# Patient Record
Sex: Female | Born: 1935 | Race: White | Hispanic: No | Smoking: Never smoker
Health system: Southern US, Community
[De-identification: ages and names within clinical notes are randomized; demographics above are authoritative.]

## PROBLEM LIST (undated history)

## (undated) DIAGNOSIS — C9002 Multiple myeloma in relapse: Secondary | ICD-10-CM

## (undated) DIAGNOSIS — I82409 Acute embolism and thrombosis of unspecified deep veins of unspecified lower extremity: Secondary | ICD-10-CM

## (undated) DIAGNOSIS — M879 Osteonecrosis, unspecified: Secondary | ICD-10-CM

## (undated) DIAGNOSIS — I4891 Unspecified atrial fibrillation: Secondary | ICD-10-CM

## (undated) DIAGNOSIS — I2510 Atherosclerotic heart disease of native coronary artery without angina pectoris: Secondary | ICD-10-CM

## (undated) DIAGNOSIS — F419 Anxiety disorder, unspecified: Secondary | ICD-10-CM

## (undated) DIAGNOSIS — Z9289 Personal history of other medical treatment: Secondary | ICD-10-CM

## (undated) DIAGNOSIS — E785 Hyperlipidemia, unspecified: Secondary | ICD-10-CM

## (undated) DIAGNOSIS — M8708 Idiopathic aseptic necrosis of bone, other site: Secondary | ICD-10-CM

## (undated) DIAGNOSIS — M1990 Unspecified osteoarthritis, unspecified site: Secondary | ICD-10-CM

## (undated) DIAGNOSIS — I2699 Other pulmonary embolism without acute cor pulmonale: Secondary | ICD-10-CM

## (undated) DIAGNOSIS — C9000 Multiple myeloma not having achieved remission: Secondary | ICD-10-CM

## (undated) DIAGNOSIS — I10 Essential (primary) hypertension: Secondary | ICD-10-CM

## (undated) HISTORY — DX: Osteonecrosis, unspecified: M87.9

## (undated) HISTORY — DX: Unspecified atrial fibrillation: I48.91

## (undated) HISTORY — DX: Anxiety disorder, unspecified: F41.9

## (undated) HISTORY — DX: Multiple myeloma in relapse: C90.02

## (undated) HISTORY — DX: Idiopathic aseptic necrosis of bone, other site: M87.08

## (undated) HISTORY — DX: Hyperlipidemia, unspecified: E78.5

## (undated) HISTORY — DX: Essential (primary) hypertension: I10

## (undated) HISTORY — DX: Atherosclerotic heart disease of native coronary artery without angina pectoris: I25.10

## (undated) HISTORY — DX: Unspecified osteoarthritis, unspecified site: M19.90

## (undated) HISTORY — DX: Personal history of other medical treatment: Z92.89

## (undated) HISTORY — PX: APPENDECTOMY: SHX54

## (undated) HISTORY — PX: ABDOMINAL HYSTERECTOMY: SHX81

## (undated) HISTORY — PX: PARTIAL COLECTOMY: SHX5273

## (undated) HISTORY — PX: TOTAL KNEE REVISION: SHX996

## (undated) HISTORY — DX: Multiple myeloma not having achieved remission: C90.00

---

## 2004-08-30 ENCOUNTER — Ambulatory Visit: Payer: Self-pay | Admitting: Internal Medicine

## 2004-09-07 ENCOUNTER — Ambulatory Visit: Payer: Self-pay | Admitting: Internal Medicine

## 2004-09-28 ENCOUNTER — Ambulatory Visit: Payer: Self-pay | Admitting: Internal Medicine

## 2004-10-04 ENCOUNTER — Ambulatory Visit: Payer: Self-pay | Admitting: Internal Medicine

## 2004-10-29 ENCOUNTER — Ambulatory Visit: Payer: Self-pay | Admitting: Surgery

## 2005-09-11 ENCOUNTER — Ambulatory Visit: Payer: Self-pay | Admitting: Ophthalmology

## 2005-09-17 ENCOUNTER — Ambulatory Visit: Payer: Self-pay | Admitting: Ophthalmology

## 2005-09-28 ENCOUNTER — Emergency Department: Payer: Self-pay | Admitting: Emergency Medicine

## 2005-10-29 ENCOUNTER — Emergency Department: Payer: Self-pay | Admitting: Emergency Medicine

## 2006-02-14 ENCOUNTER — Ambulatory Visit: Payer: Self-pay | Admitting: Family Medicine

## 2006-05-20 ENCOUNTER — Ambulatory Visit: Payer: Self-pay | Admitting: Internal Medicine

## 2006-05-20 ENCOUNTER — Ambulatory Visit: Payer: Self-pay | Admitting: General Practice

## 2006-05-27 ENCOUNTER — Ambulatory Visit: Payer: Self-pay | Admitting: Internal Medicine

## 2006-09-03 ENCOUNTER — Ambulatory Visit: Payer: Self-pay | Admitting: General Practice

## 2006-09-03 ENCOUNTER — Other Ambulatory Visit: Payer: Self-pay

## 2006-09-15 ENCOUNTER — Ambulatory Visit: Payer: Self-pay | Admitting: General Practice

## 2006-12-30 ENCOUNTER — Ambulatory Visit: Payer: Self-pay | Admitting: Ophthalmology

## 2007-03-05 ENCOUNTER — Ambulatory Visit: Payer: Self-pay | Admitting: General Practice

## 2007-03-05 ENCOUNTER — Other Ambulatory Visit: Payer: Self-pay

## 2007-03-18 ENCOUNTER — Inpatient Hospital Stay: Payer: Self-pay | Admitting: General Practice

## 2007-05-15 ENCOUNTER — Ambulatory Visit: Payer: Self-pay | Admitting: Physician Assistant

## 2007-05-18 ENCOUNTER — Ambulatory Visit: Payer: Self-pay | Admitting: General Practice

## 2007-06-02 ENCOUNTER — Ambulatory Visit: Payer: Self-pay | Admitting: Family Medicine

## 2007-06-15 ENCOUNTER — Ambulatory Visit: Payer: Self-pay | Admitting: Gastroenterology

## 2007-06-17 ENCOUNTER — Ambulatory Visit: Payer: Self-pay | Admitting: Anesthesiology

## 2007-07-16 ENCOUNTER — Ambulatory Visit: Payer: Self-pay | Admitting: Family Medicine

## 2007-07-17 ENCOUNTER — Ambulatory Visit: Payer: Self-pay | Admitting: Anesthesiology

## 2007-08-21 ENCOUNTER — Ambulatory Visit: Payer: Self-pay | Admitting: Anesthesiology

## 2007-09-16 ENCOUNTER — Ambulatory Visit: Payer: Self-pay | Admitting: Anesthesiology

## 2007-10-29 ENCOUNTER — Ambulatory Visit: Payer: Self-pay | Admitting: Anesthesiology

## 2007-11-11 ENCOUNTER — Ambulatory Visit: Payer: Self-pay | Admitting: Internal Medicine

## 2007-11-11 ENCOUNTER — Ambulatory Visit: Payer: Self-pay | Admitting: General Practice

## 2007-11-11 ENCOUNTER — Other Ambulatory Visit: Payer: Self-pay

## 2007-11-16 ENCOUNTER — Ambulatory Visit: Payer: Self-pay | Admitting: General Practice

## 2007-12-21 ENCOUNTER — Ambulatory Visit: Payer: Self-pay | Admitting: Family Medicine

## 2008-10-25 ENCOUNTER — Ambulatory Visit: Payer: Self-pay | Admitting: Internal Medicine

## 2009-11-08 ENCOUNTER — Ambulatory Visit: Payer: Self-pay | Admitting: Internal Medicine

## 2009-11-15 ENCOUNTER — Ambulatory Visit: Payer: Self-pay | Admitting: Pain Medicine

## 2009-11-20 ENCOUNTER — Ambulatory Visit: Payer: Self-pay | Admitting: Pain Medicine

## 2009-11-23 ENCOUNTER — Ambulatory Visit: Payer: Self-pay | Admitting: Rheumatology

## 2009-11-24 ENCOUNTER — Ambulatory Visit: Payer: Self-pay | Admitting: Internal Medicine

## 2009-12-05 ENCOUNTER — Ambulatory Visit: Payer: Self-pay | Admitting: Internal Medicine

## 2009-12-08 ENCOUNTER — Ambulatory Visit: Payer: Self-pay | Admitting: Internal Medicine

## 2009-12-26 ENCOUNTER — Ambulatory Visit: Payer: Self-pay | Admitting: Vascular Surgery

## 2010-01-08 ENCOUNTER — Ambulatory Visit: Payer: Self-pay | Admitting: Internal Medicine

## 2010-02-08 ENCOUNTER — Ambulatory Visit: Payer: Self-pay | Admitting: Internal Medicine

## 2010-03-10 ENCOUNTER — Ambulatory Visit: Payer: Self-pay | Admitting: Internal Medicine

## 2010-04-10 ENCOUNTER — Ambulatory Visit: Payer: Self-pay | Admitting: Internal Medicine

## 2010-05-10 ENCOUNTER — Ambulatory Visit: Payer: Self-pay | Admitting: Internal Medicine

## 2010-06-10 ENCOUNTER — Ambulatory Visit: Payer: Self-pay | Admitting: Internal Medicine

## 2010-07-11 ENCOUNTER — Ambulatory Visit: Payer: Self-pay | Admitting: Internal Medicine

## 2010-08-09 ENCOUNTER — Ambulatory Visit: Payer: Self-pay | Admitting: Internal Medicine

## 2010-09-09 ENCOUNTER — Ambulatory Visit: Payer: Self-pay | Admitting: Internal Medicine

## 2010-10-09 ENCOUNTER — Ambulatory Visit: Payer: Self-pay | Admitting: Internal Medicine

## 2010-11-09 ENCOUNTER — Ambulatory Visit: Payer: Self-pay | Admitting: Internal Medicine

## 2010-12-09 ENCOUNTER — Ambulatory Visit: Payer: Self-pay | Admitting: Internal Medicine

## 2010-12-26 ENCOUNTER — Ambulatory Visit: Payer: Self-pay | Admitting: Internal Medicine

## 2011-01-09 ENCOUNTER — Ambulatory Visit: Payer: Self-pay | Admitting: Internal Medicine

## 2011-02-09 ENCOUNTER — Ambulatory Visit: Payer: Self-pay | Admitting: Internal Medicine

## 2011-02-27 ENCOUNTER — Ambulatory Visit: Payer: Self-pay | Admitting: Pain Medicine

## 2011-02-28 ENCOUNTER — Ambulatory Visit: Payer: Self-pay | Admitting: Pain Medicine

## 2011-03-06 ENCOUNTER — Ambulatory Visit: Payer: Self-pay | Admitting: Internal Medicine

## 2011-03-11 ENCOUNTER — Ambulatory Visit: Payer: Self-pay | Admitting: Internal Medicine

## 2011-03-25 ENCOUNTER — Ambulatory Visit: Payer: Self-pay | Admitting: Pain Medicine

## 2011-03-28 ENCOUNTER — Ambulatory Visit: Payer: Self-pay | Admitting: Pain Medicine

## 2011-04-03 ENCOUNTER — Ambulatory Visit: Payer: Self-pay | Admitting: Internal Medicine

## 2011-04-11 ENCOUNTER — Ambulatory Visit: Payer: Self-pay | Admitting: Internal Medicine

## 2011-04-15 ENCOUNTER — Ambulatory Visit: Payer: Self-pay | Admitting: Pain Medicine

## 2011-04-18 ENCOUNTER — Ambulatory Visit: Payer: Self-pay | Admitting: Pain Medicine

## 2011-05-01 ENCOUNTER — Ambulatory Visit: Payer: Self-pay | Admitting: Internal Medicine

## 2011-05-06 ENCOUNTER — Ambulatory Visit: Payer: Self-pay | Admitting: Pain Medicine

## 2011-05-11 ENCOUNTER — Ambulatory Visit: Payer: Self-pay | Admitting: Oncology

## 2011-05-11 ENCOUNTER — Ambulatory Visit: Payer: Self-pay | Admitting: Internal Medicine

## 2011-05-20 ENCOUNTER — Ambulatory Visit: Payer: Self-pay | Admitting: Pain Medicine

## 2011-06-19 ENCOUNTER — Ambulatory Visit: Payer: Self-pay | Admitting: Internal Medicine

## 2011-06-19 DIAGNOSIS — I4891 Unspecified atrial fibrillation: Secondary | ICD-10-CM | POA: Diagnosis not present

## 2011-06-19 DIAGNOSIS — M1990 Unspecified osteoarthritis, unspecified site: Secondary | ICD-10-CM | POA: Diagnosis not present

## 2011-06-19 DIAGNOSIS — M5430 Sciatica, unspecified side: Secondary | ICD-10-CM | POA: Diagnosis not present

## 2011-06-19 DIAGNOSIS — M8708 Idiopathic aseptic necrosis of bone, other site: Secondary | ICD-10-CM | POA: Diagnosis not present

## 2011-06-19 DIAGNOSIS — R5381 Other malaise: Secondary | ICD-10-CM | POA: Diagnosis not present

## 2011-06-19 DIAGNOSIS — C9001 Multiple myeloma in remission: Secondary | ICD-10-CM | POA: Diagnosis not present

## 2011-06-19 DIAGNOSIS — M810 Age-related osteoporosis without current pathological fracture: Secondary | ICD-10-CM | POA: Diagnosis not present

## 2011-06-19 DIAGNOSIS — IMO0002 Reserved for concepts with insufficient information to code with codable children: Secondary | ICD-10-CM | POA: Diagnosis not present

## 2011-06-19 DIAGNOSIS — Z5111 Encounter for antineoplastic chemotherapy: Secondary | ICD-10-CM | POA: Diagnosis not present

## 2011-06-19 DIAGNOSIS — B949 Sequelae of unspecified infectious and parasitic disease: Secondary | ICD-10-CM | POA: Diagnosis not present

## 2011-06-19 DIAGNOSIS — Z79899 Other long term (current) drug therapy: Secondary | ICD-10-CM | POA: Diagnosis not present

## 2011-06-19 DIAGNOSIS — E785 Hyperlipidemia, unspecified: Secondary | ICD-10-CM | POA: Diagnosis not present

## 2011-06-19 LAB — CBC CANCER CENTER
Basophil #: 0 x10 3/mm (ref 0.0–0.1)
Basophil %: 0.6 %
Eosinophil #: 0.1 x10 3/mm (ref 0.0–0.7)
Eosinophil %: 0.9 %
HCT: 29.5 % — ABNORMAL LOW (ref 35.0–47.0)
HGB: 10.1 g/dL — ABNORMAL LOW (ref 12.0–16.0)
Lymphocyte #: 1.1 x10 3/mm (ref 1.0–3.6)
Lymphocyte %: 17.4 %
Monocyte #: 0.5 x10 3/mm (ref 0.0–0.7)
Neutrophil #: 4.5 x10 3/mm (ref 1.4–6.5)
Neutrophil %: 73.6 %
RBC: 3.11 10*6/uL — ABNORMAL LOW (ref 3.80–5.20)
WBC: 6.1 x10 3/mm (ref 3.6–11.0)

## 2011-06-19 LAB — COMPREHENSIVE METABOLIC PANEL
Anion Gap: 3 — ABNORMAL LOW (ref 7–16)
Bilirubin,Total: 0.3 mg/dL (ref 0.2–1.0)
Calcium, Total: 8.5 mg/dL (ref 8.5–10.1)
Chloride: 104 mmol/L (ref 98–107)
Co2: 33 mmol/L — ABNORMAL HIGH (ref 21–32)
EGFR (African American): >60
Osmolality: 281 (ref 275–301)
SGOT(AST): 17 U/L (ref 15–37)
Total Protein: 7.8 g/dL (ref 6.4–8.2)

## 2011-06-24 DIAGNOSIS — J069 Acute upper respiratory infection, unspecified: Secondary | ICD-10-CM | POA: Diagnosis not present

## 2011-06-24 DIAGNOSIS — R05 Cough: Secondary | ICD-10-CM | POA: Diagnosis not present

## 2011-07-03 DIAGNOSIS — C9001 Multiple myeloma in remission: Secondary | ICD-10-CM | POA: Diagnosis not present

## 2011-07-03 DIAGNOSIS — M8708 Idiopathic aseptic necrosis of bone, other site: Secondary | ICD-10-CM | POA: Diagnosis not present

## 2011-07-11 DIAGNOSIS — Z961 Presence of intraocular lens: Secondary | ICD-10-CM | POA: Diagnosis not present

## 2011-07-11 DIAGNOSIS — H2510 Age-related nuclear cataract, unspecified eye: Secondary | ICD-10-CM | POA: Diagnosis not present

## 2011-07-11 LAB — CBC CANCER CENTER
Basophil #: 0 x10 3/mm (ref 0.0–0.1)
Eosinophil #: 0 x10 3/mm (ref 0.0–0.7)
HGB: 11 g/dL — ABNORMAL LOW (ref 12.0–16.0)
Lymphocyte %: 18.1 %
MCHC: 34.5 g/dL (ref 32.0–36.0)
MCV: 95 fL (ref 80–100)
Monocyte #: 0.5 x10 3/mm (ref 0.0–0.7)
Neutrophil #: 4.7 x10 3/mm (ref 1.4–6.5)
Neutrophil %: 73 %
Platelet: 210 x10 3/mm (ref 150–440)
RDW: 14.3 % (ref 11.5–14.5)
WBC: 6.4 x10 3/mm (ref 3.6–11.0)

## 2011-07-12 ENCOUNTER — Ambulatory Visit: Payer: Self-pay | Admitting: Internal Medicine

## 2011-07-12 DIAGNOSIS — M064 Inflammatory polyarthropathy: Secondary | ICD-10-CM | POA: Diagnosis not present

## 2011-07-12 DIAGNOSIS — M1990 Unspecified osteoarthritis, unspecified site: Secondary | ICD-10-CM | POA: Diagnosis not present

## 2011-07-12 DIAGNOSIS — Z5111 Encounter for antineoplastic chemotherapy: Secondary | ICD-10-CM | POA: Diagnosis not present

## 2011-07-12 DIAGNOSIS — C9001 Multiple myeloma in remission: Secondary | ICD-10-CM | POA: Diagnosis not present

## 2011-07-12 DIAGNOSIS — M810 Age-related osteoporosis without current pathological fracture: Secondary | ICD-10-CM | POA: Diagnosis not present

## 2011-07-12 DIAGNOSIS — I4891 Unspecified atrial fibrillation: Secondary | ICD-10-CM | POA: Diagnosis not present

## 2011-07-12 DIAGNOSIS — Z79899 Other long term (current) drug therapy: Secondary | ICD-10-CM | POA: Diagnosis not present

## 2011-07-12 DIAGNOSIS — E785 Hyperlipidemia, unspecified: Secondary | ICD-10-CM | POA: Diagnosis not present

## 2011-07-12 DIAGNOSIS — M8708 Idiopathic aseptic necrosis of bone, other site: Secondary | ICD-10-CM | POA: Diagnosis not present

## 2011-07-12 DIAGNOSIS — K5730 Diverticulosis of large intestine without perforation or abscess without bleeding: Secondary | ICD-10-CM | POA: Diagnosis not present

## 2011-07-12 DIAGNOSIS — F411 Generalized anxiety disorder: Secondary | ICD-10-CM | POA: Diagnosis not present

## 2011-07-12 DIAGNOSIS — R5381 Other malaise: Secondary | ICD-10-CM | POA: Diagnosis not present

## 2011-07-17 DIAGNOSIS — N362 Urethral caruncle: Secondary | ICD-10-CM | POA: Diagnosis not present

## 2011-07-17 DIAGNOSIS — N393 Stress incontinence (female) (male): Secondary | ICD-10-CM | POA: Diagnosis not present

## 2011-07-17 DIAGNOSIS — M8708 Idiopathic aseptic necrosis of bone, other site: Secondary | ICD-10-CM | POA: Diagnosis not present

## 2011-07-17 DIAGNOSIS — C9001 Multiple myeloma in remission: Secondary | ICD-10-CM | POA: Diagnosis not present

## 2011-07-17 DIAGNOSIS — R339 Retention of urine, unspecified: Secondary | ICD-10-CM | POA: Diagnosis not present

## 2011-07-17 LAB — CBC CANCER CENTER
Basophil #: 0 x10 3/mm (ref 0.0–0.1)
Basophil %: 0.4 %
Eosinophil #: 0 x10 3/mm (ref 0.0–0.7)
HCT: 31 % — ABNORMAL LOW (ref 35.0–47.0)
HGB: 10.6 g/dL — ABNORMAL LOW (ref 12.0–16.0)
Lymphocyte %: 25.5 %
MCV: 96 fL (ref 80–100)
Monocyte %: 7.7 %
Neutrophil #: 3.6 x10 3/mm (ref 1.4–6.5)
Neutrophil %: 65.7 %
WBC: 5.4 x10 3/mm (ref 3.6–11.0)

## 2011-07-17 LAB — COMPREHENSIVE METABOLIC PANEL
Albumin: 3 g/dL — ABNORMAL LOW (ref 3.4–5.0)
Alkaline Phosphatase: 73 U/L (ref 50–136)
Calcium, Total: 8.4 mg/dL — ABNORMAL LOW (ref 8.5–10.1)
Chloride: 103 mmol/L (ref 98–107)
Co2: 31 mmol/L (ref 21–32)
EGFR (African American): >60
EGFR (Non-African Amer.): 58 — ABNORMAL LOW
Glucose: 95 mg/dL (ref 65–99)
Osmolality: 279 (ref 275–301)
SGOT(AST): 19 U/L (ref 15–37)
SGPT (ALT): 20 U/L
Sodium: 139 mmol/L (ref 136–145)

## 2011-07-24 LAB — CBC CANCER CENTER
Basophil #: 0 x10 3/mm (ref 0.0–0.1)
Basophil %: 0.4 %
Eosinophil #: 0 x10 3/mm (ref 0.0–0.7)
HCT: 32.3 % — ABNORMAL LOW (ref 35.0–47.0)
HGB: 10.9 g/dL — ABNORMAL LOW (ref 12.0–16.0)
Lymphocyte %: 15 %
MCHC: 33.9 g/dL (ref 32.0–36.0)
MCV: 96 fL (ref 80–100)
Neutrophil %: 76.3 %
RBC: 3.37 10*6/uL — ABNORMAL LOW (ref 3.80–5.20)
RDW: 15.2 % — ABNORMAL HIGH (ref 11.5–14.5)

## 2011-07-31 DIAGNOSIS — C9001 Multiple myeloma in remission: Secondary | ICD-10-CM | POA: Diagnosis not present

## 2011-07-31 DIAGNOSIS — R5381 Other malaise: Secondary | ICD-10-CM | POA: Diagnosis not present

## 2011-07-31 DIAGNOSIS — M8708 Idiopathic aseptic necrosis of bone, other site: Secondary | ICD-10-CM | POA: Diagnosis not present

## 2011-07-31 LAB — COMPREHENSIVE METABOLIC PANEL
Anion Gap: 7 (ref 7–16)
BUN: 15 mg/dL (ref 7–18)
Bilirubin,Total: 0.2 mg/dL (ref 0.2–1.0)
Calcium, Total: 8.5 mg/dL (ref 8.5–10.1)
Chloride: 103 mmol/L (ref 98–107)
Co2: 28 mmol/L (ref 21–32)
Creatinine: 0.94 mg/dL (ref 0.60–1.30)
EGFR (African American): >60
EGFR (Non-African Amer.): >60
Potassium: 3.9 mmol/L (ref 3.5–5.1)
SGOT(AST): 18 U/L (ref 15–37)
SGPT (ALT): 19 U/L
Total Protein: 7.9 g/dL (ref 6.4–8.2)

## 2011-07-31 LAB — CBC CANCER CENTER
Basophil %: 0.4 %
Eosinophil #: 0.1 x10 3/mm (ref 0.0–0.7)
Eosinophil %: 0.7 %
HCT: 32.1 % — ABNORMAL LOW (ref 35.0–47.0)
HGB: 11 g/dL — ABNORMAL LOW (ref 12.0–16.0)
Lymphocyte #: 1.3 x10 3/mm (ref 1.0–3.6)
Lymphocyte %: 18.5 %
MCV: 95 fL (ref 80–100)
Monocyte #: 0.5 x10 3/mm (ref 0.0–0.7)
Monocyte %: 7.4 %
Neutrophil %: 73 %
RBC: 3.37 10*6/uL — ABNORMAL LOW (ref 3.80–5.20)
WBC: 6.8 x10 3/mm (ref 3.6–11.0)

## 2011-08-09 ENCOUNTER — Ambulatory Visit: Payer: Self-pay | Admitting: Internal Medicine

## 2011-08-09 DIAGNOSIS — Z79899 Other long term (current) drug therapy: Secondary | ICD-10-CM | POA: Diagnosis not present

## 2011-08-09 DIAGNOSIS — R5381 Other malaise: Secondary | ICD-10-CM | POA: Diagnosis not present

## 2011-08-09 DIAGNOSIS — M549 Dorsalgia, unspecified: Secondary | ICD-10-CM | POA: Diagnosis not present

## 2011-08-09 DIAGNOSIS — C9001 Multiple myeloma in remission: Secondary | ICD-10-CM | POA: Diagnosis not present

## 2011-08-09 DIAGNOSIS — Z5111 Encounter for antineoplastic chemotherapy: Secondary | ICD-10-CM | POA: Diagnosis not present

## 2011-08-09 DIAGNOSIS — M8708 Idiopathic aseptic necrosis of bone, other site: Secondary | ICD-10-CM | POA: Diagnosis not present

## 2011-08-09 DIAGNOSIS — M810 Age-related osteoporosis without current pathological fracture: Secondary | ICD-10-CM | POA: Diagnosis not present

## 2011-08-09 DIAGNOSIS — L0390 Cellulitis, unspecified: Secondary | ICD-10-CM | POA: Diagnosis not present

## 2011-08-09 DIAGNOSIS — E785 Hyperlipidemia, unspecified: Secondary | ICD-10-CM | POA: Diagnosis not present

## 2011-08-09 DIAGNOSIS — M064 Inflammatory polyarthropathy: Secondary | ICD-10-CM | POA: Diagnosis not present

## 2011-08-09 DIAGNOSIS — L0291 Cutaneous abscess, unspecified: Secondary | ICD-10-CM | POA: Diagnosis not present

## 2011-08-09 DIAGNOSIS — K5730 Diverticulosis of large intestine without perforation or abscess without bleeding: Secondary | ICD-10-CM | POA: Diagnosis not present

## 2011-08-09 DIAGNOSIS — I4891 Unspecified atrial fibrillation: Secondary | ICD-10-CM | POA: Diagnosis not present

## 2011-08-14 LAB — CBC CANCER CENTER
Basophil #: 0 x10 3/mm (ref 0.0–0.1)
Eosinophil %: 0.7 %
HCT: 31.7 % — ABNORMAL LOW (ref 35.0–47.0)
HGB: 10.8 g/dL — ABNORMAL LOW (ref 12.0–16.0)
Lymphocyte #: 1.2 x10 3/mm (ref 1.0–3.6)
Lymphocyte %: 16.9 %
MCV: 96 fL (ref 80–100)
Monocyte %: 7.9 %
Neutrophil #: 5.1 x10 3/mm (ref 1.4–6.5)
Neutrophil %: 74.2 %
Platelet: 307 x10 3/mm (ref 150–440)
RBC: 3.32 10*6/uL — ABNORMAL LOW (ref 3.80–5.20)
RDW: 15.3 % — ABNORMAL HIGH (ref 11.5–14.5)
WBC: 6.9 x10 3/mm (ref 3.6–11.0)

## 2011-08-28 DIAGNOSIS — C9001 Multiple myeloma in remission: Secondary | ICD-10-CM | POA: Diagnosis not present

## 2011-08-28 DIAGNOSIS — M8708 Idiopathic aseptic necrosis of bone, other site: Secondary | ICD-10-CM | POA: Diagnosis not present

## 2011-08-28 LAB — COMPREHENSIVE METABOLIC PANEL
Albumin: 3.3 g/dL — ABNORMAL LOW (ref 3.4–5.0)
Anion Gap: 10 (ref 7–16)
Bilirubin,Total: 0.3 mg/dL (ref 0.2–1.0)
Calcium, Total: 9 mg/dL (ref 8.5–10.1)
Co2: 27 mmol/L (ref 21–32)
EGFR (Non-African Amer.): >60
Osmolality: 280 (ref 275–301)
Potassium: 4.1 mmol/L (ref 3.5–5.1)
SGPT (ALT): 14 U/L
Sodium: 139 mmol/L (ref 136–145)
Total Protein: 8.5 g/dL — ABNORMAL HIGH (ref 6.4–8.2)

## 2011-08-28 LAB — CBC CANCER CENTER
Basophil #: 0 x10 3/mm (ref 0.0–0.1)
Eosinophil #: 0.1 x10 3/mm (ref 0.0–0.7)
Eosinophil %: 0.9 %
Lymphocyte #: 1.2 x10 3/mm (ref 1.0–3.6)
MCH: 32.3 pg (ref 26.0–34.0)
MCHC: 33.3 g/dL (ref 32.0–36.0)
MCV: 97 fL (ref 80–100)
Monocyte #: 0.4 x10 3/mm (ref 0.0–0.7)
Monocyte %: 7.2 %
Neutrophil #: 4.1 x10 3/mm (ref 1.4–6.5)
Neutrophil %: 71.4 %
Platelet: 296 x10 3/mm (ref 150–440)
RBC: 3.35 10*6/uL — ABNORMAL LOW (ref 3.80–5.20)
RDW: 14.9 % — ABNORMAL HIGH (ref 11.5–14.5)
WBC: 5.8 x10 3/mm (ref 3.6–11.0)

## 2011-08-29 LAB — PROT IMMUNOELECTROPHORES(ARMC)

## 2011-09-03 DIAGNOSIS — I4891 Unspecified atrial fibrillation: Secondary | ICD-10-CM | POA: Diagnosis not present

## 2011-09-03 DIAGNOSIS — I059 Rheumatic mitral valve disease, unspecified: Secondary | ICD-10-CM | POA: Diagnosis not present

## 2011-09-09 ENCOUNTER — Ambulatory Visit: Payer: Self-pay | Admitting: Internal Medicine

## 2011-09-09 DIAGNOSIS — C9001 Multiple myeloma in remission: Secondary | ICD-10-CM | POA: Diagnosis not present

## 2011-09-09 DIAGNOSIS — R51 Headache: Secondary | ICD-10-CM | POA: Diagnosis not present

## 2011-09-09 DIAGNOSIS — Z79899 Other long term (current) drug therapy: Secondary | ICD-10-CM | POA: Diagnosis not present

## 2011-09-09 DIAGNOSIS — Z5111 Encounter for antineoplastic chemotherapy: Secondary | ICD-10-CM | POA: Diagnosis not present

## 2011-09-09 DIAGNOSIS — E785 Hyperlipidemia, unspecified: Secondary | ICD-10-CM | POA: Diagnosis not present

## 2011-09-09 DIAGNOSIS — M549 Dorsalgia, unspecified: Secondary | ICD-10-CM | POA: Diagnosis not present

## 2011-09-09 DIAGNOSIS — M8708 Idiopathic aseptic necrosis of bone, other site: Secondary | ICD-10-CM | POA: Diagnosis not present

## 2011-09-09 DIAGNOSIS — I4891 Unspecified atrial fibrillation: Secondary | ICD-10-CM | POA: Diagnosis not present

## 2011-09-09 DIAGNOSIS — R5381 Other malaise: Secondary | ICD-10-CM | POA: Diagnosis not present

## 2011-09-09 DIAGNOSIS — M064 Inflammatory polyarthropathy: Secondary | ICD-10-CM | POA: Diagnosis not present

## 2011-09-09 DIAGNOSIS — M810 Age-related osteoporosis without current pathological fracture: Secondary | ICD-10-CM | POA: Diagnosis not present

## 2011-09-09 DIAGNOSIS — R21 Rash and other nonspecific skin eruption: Secondary | ICD-10-CM | POA: Diagnosis not present

## 2011-09-09 DIAGNOSIS — K5730 Diverticulosis of large intestine without perforation or abscess without bleeding: Secondary | ICD-10-CM | POA: Diagnosis not present

## 2011-09-10 DIAGNOSIS — I4891 Unspecified atrial fibrillation: Secondary | ICD-10-CM | POA: Diagnosis not present

## 2011-09-10 DIAGNOSIS — M272 Inflammatory conditions of jaws: Secondary | ICD-10-CM | POA: Diagnosis not present

## 2011-09-10 DIAGNOSIS — I059 Rheumatic mitral valve disease, unspecified: Secondary | ICD-10-CM | POA: Diagnosis not present

## 2011-09-11 LAB — CBC CANCER CENTER
Basophil #: 0 x10 3/mm (ref 0.0–0.1)
Basophil %: 0.4 %
Eosinophil #: 0 x10 3/mm (ref 0.0–0.7)
Eosinophil %: 1 %
HCT: 32.6 % — ABNORMAL LOW (ref 35.0–47.0)
HGB: 11.2 g/dL — ABNORMAL LOW (ref 12.0–16.0)
Lymphocyte #: 1.3 x10 3/mm (ref 1.0–3.6)
Lymphocyte %: 26.5 %
MCH: 32.4 pg (ref 26.0–34.0)
MCHC: 34.2 g/dL (ref 32.0–36.0)
MCV: 95 fL (ref 80–100)
Monocyte #: 0.4 x10 3/mm (ref 0.0–0.7)
Monocyte %: 8.5 %
Neutrophil #: 3 x10 3/mm (ref 1.4–6.5)
Neutrophil %: 63.6 %
RBC: 3.44 10*6/uL — ABNORMAL LOW (ref 3.80–5.20)
RDW: 15.1 % — ABNORMAL HIGH (ref 11.5–14.5)

## 2011-09-18 DIAGNOSIS — M8708 Idiopathic aseptic necrosis of bone, other site: Secondary | ICD-10-CM | POA: Diagnosis not present

## 2011-09-18 DIAGNOSIS — R21 Rash and other nonspecific skin eruption: Secondary | ICD-10-CM | POA: Diagnosis not present

## 2011-09-18 DIAGNOSIS — C9001 Multiple myeloma in remission: Secondary | ICD-10-CM | POA: Diagnosis not present

## 2011-09-18 LAB — BASIC METABOLIC PANEL
Anion Gap: 9 (ref 7–16)
Co2: 28 mmol/L (ref 21–32)
Creatinine: 1.07 mg/dL (ref 0.60–1.30)
EGFR (Non-African Amer.): 52 — ABNORMAL LOW
Glucose: 158 mg/dL — ABNORMAL HIGH (ref 65–99)
Potassium: 3.9 mmol/L (ref 3.5–5.1)
Sodium: 139 mmol/L (ref 136–145)

## 2011-09-18 LAB — CBC CANCER CENTER
Basophil %: 0.2 %
Eosinophil #: 0.1 x10 3/mm (ref 0.0–0.7)
Eosinophil %: 1.3 %
HGB: 10.8 g/dL — ABNORMAL LOW (ref 12.0–16.0)
MCH: 32.9 pg (ref 26.0–34.0)
MCHC: 34.2 g/dL (ref 32.0–36.0)
MCV: 96 fL (ref 80–100)
Platelet: 218 x10 3/mm (ref 150–440)
WBC: 4.5 x10 3/mm (ref 3.6–11.0)

## 2011-09-18 LAB — MAGNESIUM: Magnesium: 1.9 mg/dL

## 2011-09-19 LAB — PROT IMMUNOELECTROPHORES(ARMC)

## 2011-09-23 DIAGNOSIS — L570 Actinic keratosis: Secondary | ICD-10-CM | POA: Diagnosis not present

## 2011-09-25 LAB — CBC CANCER CENTER
Basophil #: 0 x10 3/mm (ref 0.0–0.1)
Basophil %: 0.3 %
Eosinophil #: 0.1 x10 3/mm (ref 0.0–0.7)
Eosinophil %: 0.9 %
HCT: 32.9 % — ABNORMAL LOW (ref 35.0–47.0)
Lymphocyte %: 18.4 %
Monocyte #: 0.5 x10 3/mm (ref 0.2–0.9)
Monocyte %: 8.6 %
RBC: 3.37 10*6/uL — ABNORMAL LOW (ref 3.80–5.20)
RDW: 14.5 % (ref 11.5–14.5)

## 2011-10-02 LAB — CBC CANCER CENTER
Basophil #: 0 x10 3/mm (ref 0.0–0.1)
Basophil %: 0.6 %
Eosinophil %: 0.6 %
HCT: 33.5 % — ABNORMAL LOW (ref 35.0–47.0)
Lymphocyte #: 1 x10 3/mm (ref 1.0–3.6)
Lymphocyte %: 17.6 %
MCHC: 32.3 g/dL (ref 32.0–36.0)
MCV: 98 fL (ref 80–100)
Monocyte #: 0.6 x10 3/mm (ref 0.2–0.9)
Neutrophil %: 71.9 %
Platelet: 193 x10 3/mm (ref 150–440)
RDW: 14.7 % — ABNORMAL HIGH (ref 11.5–14.5)
WBC: 5.9 x10 3/mm (ref 3.6–11.0)

## 2011-10-09 ENCOUNTER — Ambulatory Visit: Payer: Self-pay | Admitting: Internal Medicine

## 2011-10-09 DIAGNOSIS — R21 Rash and other nonspecific skin eruption: Secondary | ICD-10-CM | POA: Diagnosis not present

## 2011-10-09 DIAGNOSIS — I4891 Unspecified atrial fibrillation: Secondary | ICD-10-CM | POA: Diagnosis not present

## 2011-10-09 DIAGNOSIS — R51 Headache: Secondary | ICD-10-CM | POA: Diagnosis not present

## 2011-10-09 DIAGNOSIS — C9000 Multiple myeloma not having achieved remission: Secondary | ICD-10-CM | POA: Diagnosis not present

## 2011-10-09 DIAGNOSIS — M8708 Idiopathic aseptic necrosis of bone, other site: Secondary | ICD-10-CM | POA: Diagnosis not present

## 2011-10-09 DIAGNOSIS — Z79899 Other long term (current) drug therapy: Secondary | ICD-10-CM | POA: Diagnosis not present

## 2011-10-09 DIAGNOSIS — K5730 Diverticulosis of large intestine without perforation or abscess without bleeding: Secondary | ICD-10-CM | POA: Diagnosis not present

## 2011-10-09 DIAGNOSIS — M810 Age-related osteoporosis without current pathological fracture: Secondary | ICD-10-CM | POA: Diagnosis not present

## 2011-10-09 DIAGNOSIS — M064 Inflammatory polyarthropathy: Secondary | ICD-10-CM | POA: Diagnosis not present

## 2011-10-09 DIAGNOSIS — E785 Hyperlipidemia, unspecified: Secondary | ICD-10-CM | POA: Diagnosis not present

## 2011-10-09 DIAGNOSIS — Z5111 Encounter for antineoplastic chemotherapy: Secondary | ICD-10-CM | POA: Diagnosis not present

## 2011-10-09 DIAGNOSIS — M25519 Pain in unspecified shoulder: Secondary | ICD-10-CM | POA: Diagnosis not present

## 2011-10-09 DIAGNOSIS — M549 Dorsalgia, unspecified: Secondary | ICD-10-CM | POA: Diagnosis not present

## 2011-10-09 DIAGNOSIS — R197 Diarrhea, unspecified: Secondary | ICD-10-CM | POA: Diagnosis not present

## 2011-10-09 DIAGNOSIS — C9001 Multiple myeloma in remission: Secondary | ICD-10-CM | POA: Diagnosis not present

## 2011-10-09 DIAGNOSIS — R5381 Other malaise: Secondary | ICD-10-CM | POA: Diagnosis not present

## 2011-10-09 DIAGNOSIS — R6884 Jaw pain: Secondary | ICD-10-CM | POA: Diagnosis not present

## 2011-10-09 LAB — COMPREHENSIVE METABOLIC PANEL
Albumin: 3.6 g/dL (ref 3.4–5.0)
Alkaline Phosphatase: 74 U/L (ref 50–136)
Anion Gap: 5 — ABNORMAL LOW (ref 7–16)
BUN: 20 mg/dL — ABNORMAL HIGH (ref 7–18)
Bilirubin,Total: 0.4 mg/dL (ref 0.2–1.0)
Calcium, Total: 9 mg/dL (ref 8.5–10.1)
Chloride: 100 mmol/L (ref 98–107)
EGFR (African American): >60
EGFR (Non-African Amer.): >60
Glucose: 94 mg/dL (ref 65–99)
Osmolality: 272 (ref 275–301)
SGOT(AST): 25 U/L (ref 15–37)
Total Protein: 9.2 g/dL — ABNORMAL HIGH (ref 6.4–8.2)

## 2011-10-09 LAB — CBC CANCER CENTER
Basophil %: 0.3 %
Eosinophil %: 0.8 %
HGB: 11.8 g/dL — ABNORMAL LOW (ref 12.0–16.0)
Lymphocyte %: 18.2 %
MCH: 31.9 pg (ref 26.0–34.0)
MCHC: 32.6 g/dL (ref 32.0–36.0)
MCV: 98 fL (ref 80–100)
Monocyte #: 0.4 x10 3/mm (ref 0.2–0.9)
Monocyte %: 7.7 %
Neutrophil #: 3.5 x10 3/mm (ref 1.4–6.5)
Neutrophil %: 73 %
RBC: 3.71 10*6/uL — ABNORMAL LOW (ref 3.80–5.20)
RDW: 14.4 % (ref 11.5–14.5)
WBC: 4.8 x10 3/mm (ref 3.6–11.0)

## 2011-10-10 LAB — URINE IEP, RANDOM

## 2011-10-16 DIAGNOSIS — M8708 Idiopathic aseptic necrosis of bone, other site: Secondary | ICD-10-CM | POA: Diagnosis not present

## 2011-10-16 DIAGNOSIS — R21 Rash and other nonspecific skin eruption: Secondary | ICD-10-CM | POA: Diagnosis not present

## 2011-10-16 DIAGNOSIS — C9001 Multiple myeloma in remission: Secondary | ICD-10-CM | POA: Diagnosis not present

## 2011-10-16 LAB — COMPREHENSIVE METABOLIC PANEL
Albumin: 3.2 g/dL — ABNORMAL LOW (ref 3.4–5.0)
Alkaline Phosphatase: 91 U/L (ref 50–136)
BUN: 12 mg/dL (ref 7–18)
Chloride: 101 mmol/L (ref 98–107)
EGFR (African American): >60
EGFR (Non-African Amer.): >60
Glucose: 88 mg/dL (ref 65–99)
Osmolality: 273 (ref 275–301)
Potassium: 3.9 mmol/L (ref 3.5–5.1)
SGOT(AST): 21 U/L (ref 15–37)
SGPT (ALT): 18 U/L

## 2011-10-16 LAB — CBC CANCER CENTER
HGB: 10.7 g/dL — ABNORMAL LOW (ref 12.0–16.0)
Lymphocyte %: 21.3 %
MCHC: 32.8 g/dL (ref 32.0–36.0)
MCV: 98 fL (ref 80–100)
Monocyte #: 0.5 x10 3/mm (ref 0.2–0.9)
Neutrophil %: 68 %
Platelet: 257 x10 3/mm (ref 150–440)
RBC: 3.33 10*6/uL — ABNORMAL LOW (ref 3.80–5.20)
WBC: 4.9 x10 3/mm (ref 3.6–11.0)

## 2011-10-23 DIAGNOSIS — R21 Rash and other nonspecific skin eruption: Secondary | ICD-10-CM | POA: Diagnosis not present

## 2011-10-23 DIAGNOSIS — M8708 Idiopathic aseptic necrosis of bone, other site: Secondary | ICD-10-CM | POA: Diagnosis not present

## 2011-10-23 DIAGNOSIS — C9001 Multiple myeloma in remission: Secondary | ICD-10-CM | POA: Diagnosis not present

## 2011-10-23 LAB — CBC CANCER CENTER
Basophil #: 0 x10 3/mm (ref 0.0–0.1)
Basophil %: 0.1 %
Eosinophil #: 0 x10 3/mm (ref 0.0–0.7)
Eosinophil %: 0.9 %
HGB: 10.7 g/dL — ABNORMAL LOW (ref 12.0–16.0)
Lymphocyte %: 18 %
MCH: 31.8 pg (ref 26.0–34.0)
MCV: 98 fL (ref 80–100)
Monocyte #: 0.6 x10 3/mm (ref 0.2–0.9)
Monocyte %: 12.5 %
Neutrophil #: 3.1 x10 3/mm (ref 1.4–6.5)
RDW: 14.2 % (ref 11.5–14.5)
WBC: 4.5 x10 3/mm (ref 3.6–11.0)

## 2011-10-23 LAB — COMPREHENSIVE METABOLIC PANEL
Albumin: 2.9 g/dL — ABNORMAL LOW (ref 3.4–5.0)
Alkaline Phosphatase: 92 U/L (ref 50–136)
Anion Gap: 8 (ref 7–16)
BUN: 12 mg/dL (ref 7–18)
Bilirubin,Total: 0.4 mg/dL (ref 0.2–1.0)
Calcium, Total: 8.5 mg/dL (ref 8.5–10.1)
Chloride: 101 mmol/L (ref 98–107)
Co2: 27 mmol/L (ref 21–32)
Osmolality: 271 (ref 275–301)
Potassium: 4 mmol/L (ref 3.5–5.1)
SGOT(AST): 20 U/L (ref 15–37)
SGPT (ALT): 17 U/L
Total Protein: 8.3 g/dL — ABNORMAL HIGH (ref 6.4–8.2)

## 2011-10-27 DIAGNOSIS — T148 Other injury of unspecified body region: Secondary | ICD-10-CM | POA: Diagnosis not present

## 2011-10-27 DIAGNOSIS — W57XXXA Bitten or stung by nonvenomous insect and other nonvenomous arthropods, initial encounter: Secondary | ICD-10-CM | POA: Diagnosis not present

## 2011-10-30 DIAGNOSIS — R21 Rash and other nonspecific skin eruption: Secondary | ICD-10-CM | POA: Diagnosis not present

## 2011-10-30 DIAGNOSIS — C9001 Multiple myeloma in remission: Secondary | ICD-10-CM | POA: Diagnosis not present

## 2011-10-30 DIAGNOSIS — R6884 Jaw pain: Secondary | ICD-10-CM | POA: Diagnosis not present

## 2011-10-30 DIAGNOSIS — M8708 Idiopathic aseptic necrosis of bone, other site: Secondary | ICD-10-CM | POA: Diagnosis not present

## 2011-10-30 LAB — COMPREHENSIVE METABOLIC PANEL
Albumin: 2.9 g/dL — ABNORMAL LOW (ref 3.4–5.0)
Alkaline Phosphatase: 90 U/L (ref 50–136)
Anion Gap: 8 (ref 7–16)
BUN: 14 mg/dL (ref 7–18)
Bilirubin,Total: 0.4 mg/dL (ref 0.2–1.0)
Calcium, Total: 8.3 mg/dL — ABNORMAL LOW (ref 8.5–10.1)
Chloride: 103 mmol/L (ref 98–107)
Co2: 28 mmol/L (ref 21–32)
Creatinine: 0.75 mg/dL (ref 0.60–1.30)
EGFR (African American): >60
EGFR (Non-African Amer.): >60
Osmolality: 278 (ref 275–301)
Potassium: 4.2 mmol/L (ref 3.5–5.1)
SGOT(AST): 19 U/L (ref 15–37)
Sodium: 139 mmol/L (ref 136–145)
Total Protein: 8.1 g/dL (ref 6.4–8.2)

## 2011-10-30 LAB — CBC CANCER CENTER
Basophil #: 0 x10 3/mm (ref 0.0–0.1)
Eosinophil #: 0 x10 3/mm (ref 0.0–0.7)
HGB: 10.6 g/dL — ABNORMAL LOW (ref 12.0–16.0)
Lymphocyte #: 0.8 x10 3/mm — ABNORMAL LOW (ref 1.0–3.6)
Lymphocyte %: 18.2 %
MCH: 32.2 pg (ref 26.0–34.0)
MCV: 98 fL (ref 80–100)
Monocyte %: 10.2 %
Neutrophil #: 3 x10 3/mm (ref 1.4–6.5)
Neutrophil %: 70.4 %
Platelet: 209 x10 3/mm (ref 150–440)
RBC: 3.29 10*6/uL — ABNORMAL LOW (ref 3.80–5.20)
RDW: 14.4 % (ref 11.5–14.5)
WBC: 4.2 x10 3/mm (ref 3.6–11.0)

## 2011-11-09 ENCOUNTER — Ambulatory Visit: Payer: Self-pay | Admitting: Internal Medicine

## 2011-11-09 DIAGNOSIS — Z5111 Encounter for antineoplastic chemotherapy: Secondary | ICD-10-CM | POA: Diagnosis not present

## 2011-11-09 DIAGNOSIS — G479 Sleep disorder, unspecified: Secondary | ICD-10-CM | POA: Diagnosis not present

## 2011-11-09 DIAGNOSIS — R5381 Other malaise: Secondary | ICD-10-CM | POA: Diagnosis not present

## 2011-11-09 DIAGNOSIS — M8708 Idiopathic aseptic necrosis of bone, other site: Secondary | ICD-10-CM | POA: Diagnosis not present

## 2011-11-09 DIAGNOSIS — R51 Headache: Secondary | ICD-10-CM | POA: Diagnosis not present

## 2011-11-09 DIAGNOSIS — C9001 Multiple myeloma in remission: Secondary | ICD-10-CM | POA: Diagnosis not present

## 2011-11-09 DIAGNOSIS — M810 Age-related osteoporosis without current pathological fracture: Secondary | ICD-10-CM | POA: Diagnosis not present

## 2011-11-09 DIAGNOSIS — E785 Hyperlipidemia, unspecified: Secondary | ICD-10-CM | POA: Diagnosis not present

## 2011-11-09 DIAGNOSIS — I4891 Unspecified atrial fibrillation: Secondary | ICD-10-CM | POA: Diagnosis not present

## 2011-11-09 DIAGNOSIS — K5730 Diverticulosis of large intestine without perforation or abscess without bleeding: Secondary | ICD-10-CM | POA: Diagnosis not present

## 2011-11-09 DIAGNOSIS — Z79899 Other long term (current) drug therapy: Secondary | ICD-10-CM | POA: Diagnosis not present

## 2011-11-09 DIAGNOSIS — M064 Inflammatory polyarthropathy: Secondary | ICD-10-CM | POA: Diagnosis not present

## 2011-11-13 DIAGNOSIS — M8708 Idiopathic aseptic necrosis of bone, other site: Secondary | ICD-10-CM | POA: Diagnosis not present

## 2011-11-13 DIAGNOSIS — Z79899 Other long term (current) drug therapy: Secondary | ICD-10-CM | POA: Diagnosis not present

## 2011-11-13 DIAGNOSIS — C9001 Multiple myeloma in remission: Secondary | ICD-10-CM | POA: Diagnosis not present

## 2011-11-13 LAB — COMPREHENSIVE METABOLIC PANEL
Albumin: 3.1 g/dL — ABNORMAL LOW (ref 3.4–5.0)
Anion Gap: 6 — ABNORMAL LOW (ref 7–16)
Bilirubin,Total: 0.5 mg/dL (ref 0.2–1.0)
Calcium, Total: 8.4 mg/dL — ABNORMAL LOW (ref 8.5–10.1)
Co2: 29 mmol/L (ref 21–32)
Creatinine: 0.81 mg/dL (ref 0.60–1.30)
EGFR (Non-African Amer.): >60
Potassium: 3.9 mmol/L (ref 3.5–5.1)
SGPT (ALT): 22 U/L
Sodium: 139 mmol/L (ref 136–145)
Total Protein: 8.3 g/dL — ABNORMAL HIGH (ref 6.4–8.2)

## 2011-11-13 LAB — CBC CANCER CENTER
Basophil %: 0.6 %
Lymphocyte %: 22 %
RBC: 3.26 10*6/uL — ABNORMAL LOW (ref 3.80–5.20)
RDW: 15.1 % — ABNORMAL HIGH (ref 11.5–14.5)

## 2011-11-20 DIAGNOSIS — Z79899 Other long term (current) drug therapy: Secondary | ICD-10-CM | POA: Diagnosis not present

## 2011-11-20 DIAGNOSIS — C9001 Multiple myeloma in remission: Secondary | ICD-10-CM | POA: Diagnosis not present

## 2011-11-20 DIAGNOSIS — M8708 Idiopathic aseptic necrosis of bone, other site: Secondary | ICD-10-CM | POA: Diagnosis not present

## 2011-11-20 LAB — COMPREHENSIVE METABOLIC PANEL
Albumin: 3.1 g/dL — ABNORMAL LOW (ref 3.4–5.0)
Alkaline Phosphatase: 103 U/L (ref 50–136)
Anion Gap: 6 — ABNORMAL LOW (ref 7–16)
Bilirubin,Total: 0.4 mg/dL (ref 0.2–1.0)
Calcium, Total: 8.4 mg/dL — ABNORMAL LOW (ref 8.5–10.1)
Chloride: 104 mmol/L (ref 98–107)
Co2: 28 mmol/L (ref 21–32)
Creatinine: 0.74 mg/dL (ref 0.60–1.30)
Osmolality: 275 (ref 275–301)
Potassium: 4.2 mmol/L (ref 3.5–5.1)
SGPT (ALT): 37 U/L
Total Protein: 8.2 g/dL (ref 6.4–8.2)

## 2011-11-20 LAB — CBC CANCER CENTER
Basophil #: 0 x10 3/mm (ref 0.0–0.1)
Eosinophil #: 0 x10 3/mm (ref 0.0–0.7)
Eosinophil %: 1.8 %
HCT: 32.5 % — ABNORMAL LOW (ref 35.0–47.0)
Lymphocyte #: 0.7 x10 3/mm — ABNORMAL LOW (ref 1.0–3.6)
Lymphocyte %: 27.4 %
MCH: 32.2 pg (ref 26.0–34.0)
Monocyte #: 0.4 x10 3/mm (ref 0.2–0.9)
Monocyte %: 15.5 %
Neutrophil #: 1.3 x10 3/mm — ABNORMAL LOW (ref 1.4–6.5)
Platelet: 154 x10 3/mm (ref 150–440)
RBC: 3.29 10*6/uL — ABNORMAL LOW (ref 3.80–5.20)
RDW: 15 % — ABNORMAL HIGH (ref 11.5–14.5)

## 2011-11-21 LAB — PROT IMMUNOELECTROPHORES(ARMC)

## 2011-11-27 DIAGNOSIS — M8708 Idiopathic aseptic necrosis of bone, other site: Secondary | ICD-10-CM | POA: Diagnosis not present

## 2011-11-27 DIAGNOSIS — C9001 Multiple myeloma in remission: Secondary | ICD-10-CM | POA: Diagnosis not present

## 2011-11-27 DIAGNOSIS — Z79899 Other long term (current) drug therapy: Secondary | ICD-10-CM | POA: Diagnosis not present

## 2011-11-27 LAB — CBC CANCER CENTER
Basophil #: 0 "x10 3/mm "
Basophil %: 0.3 %
Eosinophil #: 0.1 "x10 3/mm "
Eosinophil %: 1.8 %
HCT: 33.1 % — ABNORMAL LOW
HGB: 10.9 g/dL — ABNORMAL LOW
Lymphocyte %: 23.7 %
Lymphs Abs: 0.8 "x10 3/mm " — ABNORMAL LOW
MCH: 32.6 pg
MCHC: 32.9 g/dL
MCV: 99 fL
Monocyte #: 0.4 "x10 3/mm "
Monocyte %: 12.8 %
Neutrophil #: 2.1 "x10 3/mm "
Neutrophil %: 61.4 %
Platelet: 199 "x10 3/mm "
RBC: 3.34 "x10 6/mm " — ABNORMAL LOW
RDW: 15.5 % — ABNORMAL HIGH
WBC: 3.5 "x10 3/mm " — ABNORMAL LOW

## 2011-11-27 LAB — BASIC METABOLIC PANEL
Anion Gap: 5 — ABNORMAL LOW (ref 7–16)
BUN: 17 mg/dL (ref 7–18)
Calcium, Total: 8.3 mg/dL — ABNORMAL LOW (ref 8.5–10.1)
Co2: 29 mmol/L (ref 21–32)
Creatinine: 0.83 mg/dL (ref 0.60–1.30)
EGFR (African American): >60
Glucose: 102 mg/dL — ABNORMAL HIGH (ref 65–99)
Potassium: 4.2 mmol/L (ref 3.5–5.1)
Sodium: 138 mmol/L (ref 136–145)

## 2011-11-28 LAB — PROT IMMUNOELECTROPHORES(ARMC)

## 2011-12-09 ENCOUNTER — Ambulatory Visit: Payer: Self-pay | Admitting: Internal Medicine

## 2011-12-09 DIAGNOSIS — Z5111 Encounter for antineoplastic chemotherapy: Secondary | ICD-10-CM | POA: Diagnosis not present

## 2011-12-09 DIAGNOSIS — R269 Unspecified abnormalities of gait and mobility: Secondary | ICD-10-CM | POA: Diagnosis not present

## 2011-12-09 DIAGNOSIS — M810 Age-related osteoporosis without current pathological fracture: Secondary | ICD-10-CM | POA: Diagnosis not present

## 2011-12-09 DIAGNOSIS — I4891 Unspecified atrial fibrillation: Secondary | ICD-10-CM | POA: Diagnosis not present

## 2011-12-09 DIAGNOSIS — R5381 Other malaise: Secondary | ICD-10-CM | POA: Diagnosis not present

## 2011-12-09 DIAGNOSIS — M25519 Pain in unspecified shoulder: Secondary | ICD-10-CM | POA: Diagnosis not present

## 2011-12-09 DIAGNOSIS — E785 Hyperlipidemia, unspecified: Secondary | ICD-10-CM | POA: Diagnosis not present

## 2011-12-09 DIAGNOSIS — C9001 Multiple myeloma in remission: Secondary | ICD-10-CM | POA: Diagnosis not present

## 2011-12-09 DIAGNOSIS — M8708 Idiopathic aseptic necrosis of bone, other site: Secondary | ICD-10-CM | POA: Diagnosis not present

## 2011-12-09 DIAGNOSIS — K5730 Diverticulosis of large intestine without perforation or abscess without bleeding: Secondary | ICD-10-CM | POA: Diagnosis not present

## 2011-12-09 DIAGNOSIS — M064 Inflammatory polyarthropathy: Secondary | ICD-10-CM | POA: Diagnosis not present

## 2011-12-09 DIAGNOSIS — Z79899 Other long term (current) drug therapy: Secondary | ICD-10-CM | POA: Diagnosis not present

## 2011-12-11 DIAGNOSIS — M8708 Idiopathic aseptic necrosis of bone, other site: Secondary | ICD-10-CM | POA: Diagnosis not present

## 2011-12-11 DIAGNOSIS — C9001 Multiple myeloma in remission: Secondary | ICD-10-CM | POA: Diagnosis not present

## 2011-12-11 DIAGNOSIS — M25519 Pain in unspecified shoulder: Secondary | ICD-10-CM | POA: Diagnosis not present

## 2011-12-11 LAB — CBC CANCER CENTER
Basophil #: 0 x10 3/mm (ref 0.0–0.1)
Basophil %: 0.7 %
HGB: 11 g/dL — ABNORMAL LOW (ref 12.0–16.0)
Lymphocyte %: 25.6 %
MCHC: 33.1 g/dL (ref 32.0–36.0)
MCV: 100 fL (ref 80–100)
Monocyte #: 0.4 x10 3/mm (ref 0.2–0.9)
Monocyte %: 12.5 %
Neutrophil #: 2.1 x10 3/mm (ref 1.4–6.5)
Neutrophil %: 59.9 %
Platelet: 251 x10 3/mm (ref 150–440)
RBC: 3.33 10*6/uL — ABNORMAL LOW (ref 3.80–5.20)
RDW: 15.4 % — ABNORMAL HIGH (ref 11.5–14.5)

## 2011-12-11 LAB — COMPREHENSIVE METABOLIC PANEL
Alkaline Phosphatase: 109 U/L (ref 50–136)
BUN: 18 mg/dL (ref 7–18)
Bilirubin,Total: 0.6 mg/dL (ref 0.2–1.0)
Calcium, Total: 8.4 mg/dL — ABNORMAL LOW (ref 8.5–10.1)
Chloride: 102 mmol/L (ref 98–107)
Creatinine: 0.83 mg/dL (ref 0.60–1.30)
EGFR (African American): >60
EGFR (Non-African Amer.): >60
Osmolality: 274 (ref 275–301)
SGOT(AST): 24 U/L (ref 15–37)
SGPT (ALT): 26 U/L
Total Protein: 8.8 g/dL — ABNORMAL HIGH (ref 6.4–8.2)

## 2011-12-25 DIAGNOSIS — C9001 Multiple myeloma in remission: Secondary | ICD-10-CM | POA: Diagnosis not present

## 2011-12-25 DIAGNOSIS — M8708 Idiopathic aseptic necrosis of bone, other site: Secondary | ICD-10-CM | POA: Diagnosis not present

## 2011-12-25 LAB — COMPREHENSIVE METABOLIC PANEL
Albumin: 3.1 g/dL — ABNORMAL LOW (ref 3.4–5.0)
Alkaline Phosphatase: 100 U/L (ref 50–136)
Bilirubin,Total: 0.6 mg/dL (ref 0.2–1.0)
Calcium, Total: 9 mg/dL (ref 8.5–10.1)
Co2: 29 mmol/L (ref 21–32)
Creatinine: 0.98 mg/dL (ref 0.60–1.30)
EGFR (Non-African Amer.): 53 — ABNORMAL LOW
Glucose: 94 mg/dL (ref 65–99)
Osmolality: 273 (ref 275–301)
Potassium: 4.5 mmol/L (ref 3.5–5.1)
SGPT (ALT): 22 U/L
Sodium: 136 mmol/L (ref 136–145)
Total Protein: 8.7 g/dL — ABNORMAL HIGH (ref 6.4–8.2)

## 2011-12-25 LAB — CBC CANCER CENTER
Eosinophil %: 2.4 %
HCT: 33.1 % — ABNORMAL LOW (ref 35.0–47.0)
Lymphocyte %: 20.4 %
MCHC: 32.9 g/dL (ref 32.0–36.0)
MCV: 101 fL — ABNORMAL HIGH (ref 80–100)
Monocyte #: 0.6 x10 3/mm (ref 0.2–0.9)
Monocyte %: 14 %
Neutrophil #: 2.7 x10 3/mm (ref 1.4–6.5)
Neutrophil %: 62.8 %
Platelet: 207 x10 3/mm (ref 150–440)
RBC: 3.29 10*6/uL — ABNORMAL LOW (ref 3.80–5.20)
RDW: 14.7 % — ABNORMAL HIGH (ref 11.5–14.5)
WBC: 4.3 x10 3/mm (ref 3.6–11.0)

## 2011-12-26 LAB — PROT IMMUNOELECTROPHORES(ARMC)

## 2011-12-26 LAB — KAPPA/LAMBDA FREE LIGHT CHAINS (ARMC)

## 2012-01-01 DIAGNOSIS — R5381 Other malaise: Secondary | ICD-10-CM | POA: Diagnosis not present

## 2012-01-01 DIAGNOSIS — R5383 Other fatigue: Secondary | ICD-10-CM | POA: Diagnosis not present

## 2012-01-01 DIAGNOSIS — R269 Unspecified abnormalities of gait and mobility: Secondary | ICD-10-CM | POA: Diagnosis not present

## 2012-01-01 DIAGNOSIS — C9001 Multiple myeloma in remission: Secondary | ICD-10-CM | POA: Diagnosis not present

## 2012-01-01 DIAGNOSIS — M8708 Idiopathic aseptic necrosis of bone, other site: Secondary | ICD-10-CM | POA: Diagnosis not present

## 2012-01-01 LAB — CBC CANCER CENTER
Basophil #: 0 x10 3/mm (ref 0.0–0.1)
Eosinophil #: 0.1 x10 3/mm (ref 0.0–0.7)
HCT: 31.6 % — ABNORMAL LOW (ref 35.0–47.0)
HGB: 10.4 g/dL — ABNORMAL LOW (ref 12.0–16.0)
MCH: 33.4 pg (ref 26.0–34.0)
MCHC: 33 g/dL (ref 32.0–36.0)
MCV: 101 fL — ABNORMAL HIGH (ref 80–100)
Monocyte %: 12.7 %
Neutrophil %: 75 %
RBC: 3.12 10*6/uL — ABNORMAL LOW (ref 3.80–5.20)
WBC: 4.4 x10 3/mm (ref 3.6–11.0)

## 2012-01-01 LAB — COMPREHENSIVE METABOLIC PANEL
Albumin: 3 g/dL — ABNORMAL LOW (ref 3.4–5.0)
Alkaline Phosphatase: 132 U/L (ref 50–136)
Bilirubin,Total: 0.7 mg/dL (ref 0.2–1.0)
Creatinine: 0.89 mg/dL (ref 0.60–1.30)
Glucose: 102 mg/dL — ABNORMAL HIGH (ref 65–99)
Osmolality: 273 (ref 275–301)
Potassium: 4 mmol/L (ref 3.5–5.1)
SGOT(AST): 20 U/L (ref 15–37)
Sodium: 136 mmol/L (ref 136–145)
Total Protein: 8.8 g/dL — ABNORMAL HIGH (ref 6.4–8.2)

## 2012-01-09 ENCOUNTER — Ambulatory Visit: Payer: Self-pay | Admitting: Internal Medicine

## 2012-01-23 ENCOUNTER — Ambulatory Visit: Payer: Self-pay | Admitting: Internal Medicine

## 2012-01-23 DIAGNOSIS — R5383 Other fatigue: Secondary | ICD-10-CM | POA: Diagnosis not present

## 2012-01-23 DIAGNOSIS — M810 Age-related osteoporosis without current pathological fracture: Secondary | ICD-10-CM | POA: Diagnosis not present

## 2012-01-23 DIAGNOSIS — R269 Unspecified abnormalities of gait and mobility: Secondary | ICD-10-CM | POA: Diagnosis not present

## 2012-01-23 DIAGNOSIS — C9000 Multiple myeloma not having achieved remission: Secondary | ICD-10-CM | POA: Diagnosis not present

## 2012-01-23 DIAGNOSIS — Z9221 Personal history of antineoplastic chemotherapy: Secondary | ICD-10-CM | POA: Diagnosis not present

## 2012-01-23 DIAGNOSIS — I4891 Unspecified atrial fibrillation: Secondary | ICD-10-CM | POA: Diagnosis not present

## 2012-01-23 DIAGNOSIS — K5730 Diverticulosis of large intestine without perforation or abscess without bleeding: Secondary | ICD-10-CM | POA: Diagnosis not present

## 2012-01-23 DIAGNOSIS — Z96659 Presence of unspecified artificial knee joint: Secondary | ICD-10-CM | POA: Diagnosis not present

## 2012-01-23 DIAGNOSIS — R5381 Other malaise: Secondary | ICD-10-CM | POA: Diagnosis not present

## 2012-01-23 DIAGNOSIS — C9001 Multiple myeloma in remission: Secondary | ICD-10-CM | POA: Diagnosis not present

## 2012-01-23 DIAGNOSIS — M064 Inflammatory polyarthropathy: Secondary | ICD-10-CM | POA: Diagnosis not present

## 2012-01-23 DIAGNOSIS — E785 Hyperlipidemia, unspecified: Secondary | ICD-10-CM | POA: Diagnosis not present

## 2012-01-23 DIAGNOSIS — M8708 Idiopathic aseptic necrosis of bone, other site: Secondary | ICD-10-CM | POA: Diagnosis not present

## 2012-01-23 DIAGNOSIS — Z79899 Other long term (current) drug therapy: Secondary | ICD-10-CM | POA: Diagnosis not present

## 2012-01-23 LAB — COMPREHENSIVE METABOLIC PANEL
Albumin: 2.9 g/dL — ABNORMAL LOW (ref 3.4–5.0)
Alkaline Phosphatase: 105 U/L (ref 50–136)
Anion Gap: 8 (ref 7–16)
Bilirubin,Total: 0.4 mg/dL (ref 0.2–1.0)
Calcium, Total: 8.6 mg/dL (ref 8.5–10.1)
Co2: 30 mmol/L (ref 21–32)
Creatinine: 1.08 mg/dL (ref 0.60–1.30)
EGFR (Non-African Amer.): 47 — ABNORMAL LOW
Glucose: 82 mg/dL (ref 65–99)
Osmolality: 271 (ref 275–301)
SGPT (ALT): 18 U/L (ref 12–78)
Sodium: 136 mmol/L (ref 136–145)
Total Protein: 9.5 g/dL — ABNORMAL HIGH (ref 6.4–8.2)

## 2012-01-23 LAB — CBC CANCER CENTER
Basophil #: 0 x10 3/mm (ref 0.0–0.1)
Eosinophil #: 0.1 x10 3/mm (ref 0.0–0.7)
HCT: 28.2 % — ABNORMAL LOW (ref 35.0–47.0)
HGB: 9.3 g/dL — ABNORMAL LOW (ref 12.0–16.0)
Lymphocyte #: 1 x10 3/mm (ref 1.0–3.6)
MCH: 34 pg (ref 26.0–34.0)
MCHC: 33.1 g/dL (ref 32.0–36.0)
MCV: 103 fL — ABNORMAL HIGH (ref 80–100)
Monocyte #: 0.4 x10 3/mm (ref 0.2–0.9)
Neutrophil %: 59.4 %
Platelet: 279 x10 3/mm (ref 150–440)
RBC: 2.74 10*6/uL — ABNORMAL LOW (ref 3.80–5.20)
RDW: 14.2 % (ref 11.5–14.5)
WBC: 3.7 x10 3/mm (ref 3.6–11.0)

## 2012-01-30 ENCOUNTER — Ambulatory Visit: Payer: Self-pay | Admitting: Urology

## 2012-01-30 DIAGNOSIS — N362 Urethral caruncle: Secondary | ICD-10-CM | POA: Diagnosis not present

## 2012-01-30 DIAGNOSIS — I10 Essential (primary) hypertension: Secondary | ICD-10-CM | POA: Diagnosis not present

## 2012-01-30 DIAGNOSIS — Z01810 Encounter for preprocedural cardiovascular examination: Secondary | ICD-10-CM | POA: Diagnosis not present

## 2012-02-04 ENCOUNTER — Ambulatory Visit: Payer: Self-pay | Admitting: Urology

## 2012-02-04 DIAGNOSIS — Z96659 Presence of unspecified artificial knee joint: Secondary | ICD-10-CM | POA: Diagnosis not present

## 2012-02-04 DIAGNOSIS — M069 Rheumatoid arthritis, unspecified: Secondary | ICD-10-CM | POA: Diagnosis not present

## 2012-02-04 DIAGNOSIS — Z79899 Other long term (current) drug therapy: Secondary | ICD-10-CM | POA: Diagnosis not present

## 2012-02-04 DIAGNOSIS — N362 Urethral caruncle: Secondary | ICD-10-CM | POA: Diagnosis not present

## 2012-02-04 DIAGNOSIS — R0982 Postnasal drip: Secondary | ICD-10-CM | POA: Diagnosis not present

## 2012-02-04 DIAGNOSIS — I10 Essential (primary) hypertension: Secondary | ICD-10-CM | POA: Diagnosis not present

## 2012-02-04 DIAGNOSIS — D649 Anemia, unspecified: Secondary | ICD-10-CM | POA: Diagnosis not present

## 2012-02-04 DIAGNOSIS — I4891 Unspecified atrial fibrillation: Secondary | ICD-10-CM | POA: Diagnosis not present

## 2012-02-04 DIAGNOSIS — R011 Cardiac murmur, unspecified: Secondary | ICD-10-CM | POA: Diagnosis not present

## 2012-02-04 DIAGNOSIS — C9000 Multiple myeloma not having achieved remission: Secondary | ICD-10-CM | POA: Diagnosis not present

## 2012-02-04 DIAGNOSIS — I2510 Atherosclerotic heart disease of native coronary artery without angina pectoris: Secondary | ICD-10-CM | POA: Diagnosis not present

## 2012-02-04 DIAGNOSIS — R51 Headache: Secondary | ICD-10-CM | POA: Diagnosis not present

## 2012-02-09 ENCOUNTER — Ambulatory Visit: Payer: Self-pay | Admitting: Internal Medicine

## 2012-02-09 DIAGNOSIS — Z9221 Personal history of antineoplastic chemotherapy: Secondary | ICD-10-CM | POA: Diagnosis not present

## 2012-02-09 DIAGNOSIS — Z79899 Other long term (current) drug therapy: Secondary | ICD-10-CM | POA: Diagnosis not present

## 2012-02-09 DIAGNOSIS — C9000 Multiple myeloma not having achieved remission: Secondary | ICD-10-CM | POA: Diagnosis not present

## 2012-02-09 DIAGNOSIS — R269 Unspecified abnormalities of gait and mobility: Secondary | ICD-10-CM | POA: Diagnosis not present

## 2012-02-09 DIAGNOSIS — I4891 Unspecified atrial fibrillation: Secondary | ICD-10-CM | POA: Diagnosis not present

## 2012-02-09 DIAGNOSIS — M810 Age-related osteoporosis without current pathological fracture: Secondary | ICD-10-CM | POA: Diagnosis not present

## 2012-02-09 DIAGNOSIS — Z96659 Presence of unspecified artificial knee joint: Secondary | ICD-10-CM | POA: Diagnosis not present

## 2012-02-09 DIAGNOSIS — M8708 Idiopathic aseptic necrosis of bone, other site: Secondary | ICD-10-CM | POA: Diagnosis not present

## 2012-02-09 DIAGNOSIS — F411 Generalized anxiety disorder: Secondary | ICD-10-CM | POA: Diagnosis not present

## 2012-02-09 DIAGNOSIS — R5381 Other malaise: Secondary | ICD-10-CM | POA: Diagnosis not present

## 2012-02-09 DIAGNOSIS — M549 Dorsalgia, unspecified: Secondary | ICD-10-CM | POA: Diagnosis not present

## 2012-02-09 DIAGNOSIS — R630 Anorexia: Secondary | ICD-10-CM | POA: Diagnosis not present

## 2012-02-09 DIAGNOSIS — M064 Inflammatory polyarthropathy: Secondary | ICD-10-CM | POA: Diagnosis not present

## 2012-02-09 DIAGNOSIS — I10 Essential (primary) hypertension: Secondary | ICD-10-CM | POA: Diagnosis not present

## 2012-02-09 DIAGNOSIS — I2510 Atherosclerotic heart disease of native coronary artery without angina pectoris: Secondary | ICD-10-CM | POA: Diagnosis not present

## 2012-02-09 DIAGNOSIS — E785 Hyperlipidemia, unspecified: Secondary | ICD-10-CM | POA: Diagnosis not present

## 2012-02-09 DIAGNOSIS — K5730 Diverticulosis of large intestine without perforation or abscess without bleeding: Secondary | ICD-10-CM | POA: Diagnosis not present

## 2012-02-17 DIAGNOSIS — N393 Stress incontinence (female) (male): Secondary | ICD-10-CM | POA: Diagnosis not present

## 2012-02-17 DIAGNOSIS — N362 Urethral caruncle: Secondary | ICD-10-CM | POA: Diagnosis not present

## 2012-02-17 DIAGNOSIS — R339 Retention of urine, unspecified: Secondary | ICD-10-CM | POA: Diagnosis not present

## 2012-02-20 DIAGNOSIS — M8708 Idiopathic aseptic necrosis of bone, other site: Secondary | ICD-10-CM | POA: Diagnosis not present

## 2012-02-20 DIAGNOSIS — R630 Anorexia: Secondary | ICD-10-CM | POA: Diagnosis not present

## 2012-02-20 DIAGNOSIS — R5381 Other malaise: Secondary | ICD-10-CM | POA: Diagnosis not present

## 2012-02-20 DIAGNOSIS — C9001 Multiple myeloma in remission: Secondary | ICD-10-CM | POA: Diagnosis not present

## 2012-02-20 LAB — CBC CANCER CENTER
Basophil #: 0 x10 3/mm (ref 0.0–0.1)
Basophil %: 0.9 %
Eosinophil #: 0.1 x10 3/mm (ref 0.0–0.7)
Eosinophil %: 2.7 %
HCT: 30.1 % — ABNORMAL LOW (ref 35.0–47.0)
Lymphocyte #: 0.8 x10 3/mm — ABNORMAL LOW (ref 1.0–3.6)
MCHC: 32.6 g/dL (ref 32.0–36.0)
MCV: 103 fL — ABNORMAL HIGH (ref 80–100)
Monocyte #: 0.3 x10 3/mm (ref 0.2–0.9)
Monocyte %: 10.6 %
Neutrophil #: 2 x10 3/mm (ref 1.4–6.5)
Platelet: 241 x10 3/mm (ref 150–440)
RDW: 14.4 % (ref 11.5–14.5)
WBC: 3.2 x10 3/mm — ABNORMAL LOW (ref 3.6–11.0)

## 2012-02-20 LAB — COMPREHENSIVE METABOLIC PANEL
Albumin: 3 g/dL — ABNORMAL LOW (ref 3.4–5.0)
Anion Gap: 4 — ABNORMAL LOW (ref 7–16)
BUN: 20 mg/dL — ABNORMAL HIGH (ref 7–18)
Bilirubin,Total: 0.3 mg/dL (ref 0.2–1.0)
Calcium, Total: 8.4 mg/dL — ABNORMAL LOW (ref 8.5–10.1)
Chloride: 100 mmol/L (ref 98–107)
Co2: 30 mmol/L (ref 21–32)
Creatinine: 1.12 mg/dL (ref 0.60–1.30)
EGFR (African American): 52 — ABNORMAL LOW
EGFR (Non-African Amer.): 45 — ABNORMAL LOW
Glucose: 82 mg/dL (ref 65–99)
Osmolality: 270 (ref 275–301)
Potassium: 3.7 mmol/L (ref 3.5–5.1)
SGOT(AST): 31 U/L (ref 15–37)
SGPT (ALT): 20 U/L (ref 12–78)
Sodium: 134 mmol/L — ABNORMAL LOW (ref 136–145)
Total Protein: 9.8 g/dL — ABNORMAL HIGH (ref 6.4–8.2)

## 2012-02-28 DIAGNOSIS — I10 Essential (primary) hypertension: Secondary | ICD-10-CM | POA: Diagnosis not present

## 2012-02-28 DIAGNOSIS — I2510 Atherosclerotic heart disease of native coronary artery without angina pectoris: Secondary | ICD-10-CM | POA: Diagnosis not present

## 2012-02-28 DIAGNOSIS — I4891 Unspecified atrial fibrillation: Secondary | ICD-10-CM | POA: Diagnosis not present

## 2012-02-28 DIAGNOSIS — R002 Palpitations: Secondary | ICD-10-CM | POA: Diagnosis not present

## 2012-03-06 DIAGNOSIS — B079 Viral wart, unspecified: Secondary | ICD-10-CM | POA: Diagnosis not present

## 2012-03-06 DIAGNOSIS — Z85828 Personal history of other malignant neoplasm of skin: Secondary | ICD-10-CM | POA: Diagnosis not present

## 2012-03-06 DIAGNOSIS — D0439 Carcinoma in situ of skin of other parts of face: Secondary | ICD-10-CM | POA: Diagnosis not present

## 2012-03-06 DIAGNOSIS — D485 Neoplasm of uncertain behavior of skin: Secondary | ICD-10-CM | POA: Diagnosis not present

## 2012-03-10 ENCOUNTER — Ambulatory Visit: Payer: Self-pay | Admitting: Oncology

## 2012-03-10 DIAGNOSIS — M1990 Unspecified osteoarthritis, unspecified site: Secondary | ICD-10-CM | POA: Diagnosis not present

## 2012-03-10 DIAGNOSIS — I498 Other specified cardiac arrhythmias: Secondary | ICD-10-CM | POA: Diagnosis not present

## 2012-03-10 DIAGNOSIS — J449 Chronic obstructive pulmonary disease, unspecified: Secondary | ICD-10-CM | POA: Diagnosis not present

## 2012-03-10 DIAGNOSIS — M545 Low back pain, unspecified: Secondary | ICD-10-CM | POA: Diagnosis not present

## 2012-03-10 DIAGNOSIS — M810 Age-related osteoporosis without current pathological fracture: Secondary | ICD-10-CM | POA: Diagnosis not present

## 2012-03-10 DIAGNOSIS — C9000 Multiple myeloma not having achieved remission: Secondary | ICD-10-CM | POA: Diagnosis not present

## 2012-03-10 DIAGNOSIS — I2510 Atherosclerotic heart disease of native coronary artery without angina pectoris: Secondary | ICD-10-CM | POA: Diagnosis not present

## 2012-03-10 DIAGNOSIS — E860 Dehydration: Secondary | ICD-10-CM | POA: Diagnosis not present

## 2012-03-10 DIAGNOSIS — M25559 Pain in unspecified hip: Secondary | ICD-10-CM | POA: Diagnosis not present

## 2012-03-10 DIAGNOSIS — I10 Essential (primary) hypertension: Secondary | ICD-10-CM | POA: Diagnosis not present

## 2012-03-10 DIAGNOSIS — M129 Arthropathy, unspecified: Secondary | ICD-10-CM | POA: Diagnosis not present

## 2012-03-10 DIAGNOSIS — Z79899 Other long term (current) drug therapy: Secondary | ICD-10-CM | POA: Diagnosis not present

## 2012-03-10 DIAGNOSIS — R4182 Altered mental status, unspecified: Secondary | ICD-10-CM | POA: Diagnosis not present

## 2012-03-10 DIAGNOSIS — R5381 Other malaise: Secondary | ICD-10-CM | POA: Diagnosis not present

## 2012-03-10 DIAGNOSIS — E785 Hyperlipidemia, unspecified: Secondary | ICD-10-CM | POA: Diagnosis not present

## 2012-03-10 DIAGNOSIS — M8708 Idiopathic aseptic necrosis of bone, other site: Secondary | ICD-10-CM | POA: Diagnosis not present

## 2012-03-10 DIAGNOSIS — I4891 Unspecified atrial fibrillation: Secondary | ICD-10-CM | POA: Diagnosis not present

## 2012-03-10 DIAGNOSIS — K5730 Diverticulosis of large intestine without perforation or abscess without bleeding: Secondary | ICD-10-CM | POA: Diagnosis not present

## 2012-03-10 DIAGNOSIS — Z9221 Personal history of antineoplastic chemotherapy: Secondary | ICD-10-CM | POA: Diagnosis not present

## 2012-03-10 DIAGNOSIS — M064 Inflammatory polyarthropathy: Secondary | ICD-10-CM | POA: Diagnosis not present

## 2012-03-10 DIAGNOSIS — I4510 Unspecified right bundle-branch block: Secondary | ICD-10-CM | POA: Diagnosis not present

## 2012-03-10 DIAGNOSIS — F29 Unspecified psychosis not due to a substance or known physiological condition: Secondary | ICD-10-CM | POA: Diagnosis not present

## 2012-03-10 DIAGNOSIS — R110 Nausea: Secondary | ICD-10-CM | POA: Diagnosis not present

## 2012-03-10 DIAGNOSIS — Z510 Encounter for antineoplastic radiation therapy: Secondary | ICD-10-CM | POA: Diagnosis not present

## 2012-03-10 DIAGNOSIS — R262 Difficulty in walking, not elsewhere classified: Secondary | ICD-10-CM | POA: Diagnosis not present

## 2012-03-11 DIAGNOSIS — H35379 Puckering of macula, unspecified eye: Secondary | ICD-10-CM | POA: Diagnosis not present

## 2012-03-19 DIAGNOSIS — R5381 Other malaise: Secondary | ICD-10-CM | POA: Diagnosis not present

## 2012-03-19 DIAGNOSIS — C9000 Multiple myeloma not having achieved remission: Secondary | ICD-10-CM | POA: Diagnosis not present

## 2012-03-19 DIAGNOSIS — M8708 Idiopathic aseptic necrosis of bone, other site: Secondary | ICD-10-CM | POA: Diagnosis not present

## 2012-03-19 DIAGNOSIS — M545 Low back pain: Secondary | ICD-10-CM | POA: Diagnosis not present

## 2012-03-19 LAB — CBC CANCER CENTER
Basophil #: 0 x10 3/mm (ref 0.0–0.1)
Eosinophil %: 1.3 %
Lymphocyte %: 21.8 %
Monocyte %: 10.5 %
Neutrophil %: 66 %
Platelet: 223 x10 3/mm (ref 150–440)
RDW: 14.2 % (ref 11.5–14.5)
WBC: 3.4 x10 3/mm — ABNORMAL LOW (ref 3.6–11.0)

## 2012-03-19 LAB — COMPREHENSIVE METABOLIC PANEL
Albumin: 3.2 g/dL — ABNORMAL LOW (ref 3.4–5.0)
Alkaline Phosphatase: 101 U/L (ref 50–136)
Anion Gap: 8 (ref 7–16)
BUN: 14 mg/dL (ref 7–18)
Creatinine: 0.74 mg/dL (ref 0.60–1.30)
EGFR (African American): >60
EGFR (Non-African Amer.): >60
Glucose: 92 mg/dL (ref 65–99)
Osmolality: 270 (ref 275–301)
Potassium: 3.8 mmol/L (ref 3.5–5.1)
SGOT(AST): 30 U/L (ref 15–37)
SGPT (ALT): 16 U/L (ref 12–78)

## 2012-03-20 DIAGNOSIS — C9000 Multiple myeloma not having achieved remission: Secondary | ICD-10-CM | POA: Diagnosis not present

## 2012-03-20 LAB — KAPPA/LAMBDA FREE LIGHT CHAINS (ARMC)

## 2012-03-27 DIAGNOSIS — M545 Low back pain: Secondary | ICD-10-CM | POA: Diagnosis not present

## 2012-03-27 DIAGNOSIS — R262 Difficulty in walking, not elsewhere classified: Secondary | ICD-10-CM | POA: Diagnosis not present

## 2012-03-27 DIAGNOSIS — C9000 Multiple myeloma not having achieved remission: Secondary | ICD-10-CM | POA: Diagnosis not present

## 2012-03-30 DIAGNOSIS — C9000 Multiple myeloma not having achieved remission: Secondary | ICD-10-CM | POA: Diagnosis not present

## 2012-03-31 DIAGNOSIS — C9000 Multiple myeloma not having achieved remission: Secondary | ICD-10-CM | POA: Diagnosis not present

## 2012-04-01 ENCOUNTER — Emergency Department: Payer: Self-pay | Admitting: Emergency Medicine

## 2012-04-01 DIAGNOSIS — R4182 Altered mental status, unspecified: Secondary | ICD-10-CM | POA: Diagnosis not present

## 2012-04-01 DIAGNOSIS — R5383 Other fatigue: Secondary | ICD-10-CM | POA: Diagnosis not present

## 2012-04-01 DIAGNOSIS — C9000 Multiple myeloma not having achieved remission: Secondary | ICD-10-CM | POA: Diagnosis not present

## 2012-04-01 DIAGNOSIS — R911 Solitary pulmonary nodule: Secondary | ICD-10-CM | POA: Diagnosis not present

## 2012-04-01 DIAGNOSIS — R5381 Other malaise: Secondary | ICD-10-CM | POA: Diagnosis not present

## 2012-04-01 LAB — CBC
HCT: 32.7 % — ABNORMAL LOW (ref 35.0–47.0)
HGB: 11.2 g/dL — ABNORMAL LOW (ref 12.0–16.0)
MCHC: 34.2 g/dL (ref 32.0–36.0)
MCV: 101 fL — ABNORMAL HIGH (ref 80–100)
RBC: 3.25 10*6/uL — ABNORMAL LOW (ref 3.80–5.20)
RDW: 14.2 % (ref 11.5–14.5)
WBC: 6 10*3/uL (ref 3.6–11.0)

## 2012-04-01 LAB — COMPREHENSIVE METABOLIC PANEL
Albumin: 2.9 g/dL — ABNORMAL LOW (ref 3.4–5.0)
Anion Gap: 7 (ref 7–16)
Bilirubin,Total: 0.4 mg/dL (ref 0.2–1.0)
Calcium, Total: 8.9 mg/dL (ref 8.5–10.1)
Creatinine: 0.79 mg/dL (ref 0.60–1.30)
EGFR (African American): >60
EGFR (Non-African Amer.): >60
Glucose: 199 mg/dL — ABNORMAL HIGH (ref 65–99)
Osmolality: 272 (ref 275–301)
Potassium: 3.8 mmol/L (ref 3.5–5.1)
SGOT(AST): 21 U/L (ref 15–37)
Sodium: 131 mmol/L — ABNORMAL LOW (ref 136–145)
Total Protein: 9.4 g/dL — ABNORMAL HIGH (ref 6.4–8.2)

## 2012-04-02 DIAGNOSIS — M545 Low back pain: Secondary | ICD-10-CM | POA: Diagnosis not present

## 2012-04-02 DIAGNOSIS — E860 Dehydration: Secondary | ICD-10-CM | POA: Diagnosis not present

## 2012-04-02 DIAGNOSIS — R4182 Altered mental status, unspecified: Secondary | ICD-10-CM | POA: Diagnosis not present

## 2012-04-02 DIAGNOSIS — C9000 Multiple myeloma not having achieved remission: Secondary | ICD-10-CM | POA: Diagnosis not present

## 2012-04-02 LAB — URINALYSIS, COMPLETE
Bacteria: NONE SEEN
Bilirubin,UR: NEGATIVE
Blood: NEGATIVE
Glucose,UR: NEGATIVE mg/dL (ref 0–75)
Leukocyte Esterase: NEGATIVE
Nitrite: NEGATIVE
Ph: 5 (ref 4.5–8.0)
Protein: NEGATIVE
RBC,UR: 1 /HPF (ref 0–5)
Squamous Epithelial: NONE SEEN
WBC UR: 1 /HPF (ref 0–5)

## 2012-04-02 LAB — CBC CANCER CENTER
Basophil #: 0 x10 3/mm (ref 0.0–0.1)
Basophil %: 0.1 %
Eosinophil #: 0 x10 3/mm (ref 0.0–0.7)
HCT: 29 % — ABNORMAL LOW (ref 35.0–47.0)
HGB: 9.8 g/dL — ABNORMAL LOW (ref 12.0–16.0)
Lymphocyte #: 0.2 x10 3/mm — ABNORMAL LOW (ref 1.0–3.6)
MCHC: 34 g/dL (ref 32.0–36.0)
MCV: 100 fL (ref 80–100)
Neutrophil #: 6.7 x10 3/mm — ABNORMAL HIGH (ref 1.4–6.5)
RDW: 14.2 % (ref 11.5–14.5)
WBC: 7.2 x10 3/mm (ref 3.6–11.0)

## 2012-04-03 DIAGNOSIS — H35379 Puckering of macula, unspecified eye: Secondary | ICD-10-CM | POA: Diagnosis not present

## 2012-04-07 DIAGNOSIS — C9000 Multiple myeloma not having achieved remission: Secondary | ICD-10-CM | POA: Diagnosis not present

## 2012-04-10 ENCOUNTER — Ambulatory Visit: Payer: Self-pay | Admitting: Internal Medicine

## 2012-04-10 DIAGNOSIS — M064 Inflammatory polyarthropathy: Secondary | ICD-10-CM | POA: Diagnosis not present

## 2012-04-10 DIAGNOSIS — M8708 Idiopathic aseptic necrosis of bone, other site: Secondary | ICD-10-CM | POA: Diagnosis not present

## 2012-04-10 DIAGNOSIS — Z993 Dependence on wheelchair: Secondary | ICD-10-CM | POA: Diagnosis not present

## 2012-04-10 DIAGNOSIS — I4891 Unspecified atrial fibrillation: Secondary | ICD-10-CM | POA: Diagnosis not present

## 2012-04-10 DIAGNOSIS — R5381 Other malaise: Secondary | ICD-10-CM | POA: Diagnosis not present

## 2012-04-10 DIAGNOSIS — M25559 Pain in unspecified hip: Secondary | ICD-10-CM | POA: Diagnosis not present

## 2012-04-10 DIAGNOSIS — G893 Neoplasm related pain (acute) (chronic): Secondary | ICD-10-CM | POA: Diagnosis not present

## 2012-04-10 DIAGNOSIS — M810 Age-related osteoporosis without current pathological fracture: Secondary | ICD-10-CM | POA: Diagnosis not present

## 2012-04-10 DIAGNOSIS — K5730 Diverticulosis of large intestine without perforation or abscess without bleeding: Secondary | ICD-10-CM | POA: Diagnosis not present

## 2012-04-10 DIAGNOSIS — Z9221 Personal history of antineoplastic chemotherapy: Secondary | ICD-10-CM | POA: Diagnosis not present

## 2012-04-10 DIAGNOSIS — E785 Hyperlipidemia, unspecified: Secondary | ICD-10-CM | POA: Diagnosis not present

## 2012-04-10 DIAGNOSIS — R630 Anorexia: Secondary | ICD-10-CM | POA: Diagnosis not present

## 2012-04-10 DIAGNOSIS — I2510 Atherosclerotic heart disease of native coronary artery without angina pectoris: Secondary | ICD-10-CM | POA: Diagnosis not present

## 2012-04-10 DIAGNOSIS — M549 Dorsalgia, unspecified: Secondary | ICD-10-CM | POA: Diagnosis not present

## 2012-04-10 DIAGNOSIS — I10 Essential (primary) hypertension: Secondary | ICD-10-CM | POA: Diagnosis not present

## 2012-04-10 DIAGNOSIS — C9000 Multiple myeloma not having achieved remission: Secondary | ICD-10-CM | POA: Diagnosis not present

## 2012-04-10 DIAGNOSIS — R110 Nausea: Secondary | ICD-10-CM | POA: Diagnosis not present

## 2012-04-10 DIAGNOSIS — Z96659 Presence of unspecified artificial knee joint: Secondary | ICD-10-CM | POA: Diagnosis not present

## 2012-04-10 DIAGNOSIS — M1990 Unspecified osteoarthritis, unspecified site: Secondary | ICD-10-CM | POA: Diagnosis not present

## 2012-04-10 DIAGNOSIS — Z90710 Acquired absence of both cervix and uterus: Secondary | ICD-10-CM | POA: Diagnosis not present

## 2012-04-10 DIAGNOSIS — Z79899 Other long term (current) drug therapy: Secondary | ICD-10-CM | POA: Diagnosis not present

## 2012-04-10 LAB — CBC CANCER CENTER
Basophil %: 0.8 %
Eosinophil #: 0.1 x10 3/mm (ref 0.0–0.7)
Eosinophil %: 9.4 %
HCT: 29.8 % — ABNORMAL LOW (ref 35.0–47.0)
HGB: 9.8 g/dL — ABNORMAL LOW (ref 12.0–16.0)
Lymphocyte #: 0.2 x10 3/mm — ABNORMAL LOW (ref 1.0–3.6)
Lymphocyte %: 21.1 %
Monocyte %: 38.9 %
Neutrophil %: 29.8 %
RDW: 14.4 % (ref 11.5–14.5)
WBC: 1 x10 3/mm — CL (ref 3.6–11.0)

## 2012-04-13 DIAGNOSIS — C9000 Multiple myeloma not having achieved remission: Secondary | ICD-10-CM | POA: Diagnosis not present

## 2012-04-13 DIAGNOSIS — M8708 Idiopathic aseptic necrosis of bone, other site: Secondary | ICD-10-CM | POA: Diagnosis not present

## 2012-04-13 DIAGNOSIS — R110 Nausea: Secondary | ICD-10-CM | POA: Diagnosis not present

## 2012-04-13 DIAGNOSIS — R630 Anorexia: Secondary | ICD-10-CM | POA: Diagnosis not present

## 2012-04-13 LAB — CBC CANCER CENTER
Basophil #: 0 x10 3/mm (ref 0.0–0.1)
Eosinophil %: 7.5 %
Lymphocyte #: 0.2 x10 3/mm — ABNORMAL LOW (ref 1.0–3.6)
Lymphocyte %: 28 %
MCV: 100 fL (ref 80–100)
Monocyte %: 31.1 %
Neutrophil #: 0.2 x10 3/mm — ABNORMAL LOW (ref 1.4–6.5)
Neutrophil %: 32.9 %
Platelet: 133 x10 3/mm — ABNORMAL LOW (ref 150–440)
RBC: 2.97 10*6/uL — ABNORMAL LOW (ref 3.80–5.20)
RDW: 14.3 % (ref 11.5–14.5)
WBC: 0.7 x10 3/mm — CL (ref 3.6–11.0)

## 2012-04-13 LAB — COMPREHENSIVE METABOLIC PANEL
Albumin: 2.3 g/dL — ABNORMAL LOW (ref 3.4–5.0)
Anion Gap: 9 (ref 7–16)
Bilirubin,Total: 0.2 mg/dL (ref 0.2–1.0)
Chloride: 101 mmol/L (ref 98–107)
Co2: 27 mmol/L (ref 21–32)
Creatinine: 0.75 mg/dL (ref 0.60–1.30)
EGFR (African American): >60
EGFR (Non-African Amer.): >60
Glucose: 90 mg/dL (ref 65–99)
Osmolality: 276 (ref 275–301)
Potassium: 3.4 mmol/L — ABNORMAL LOW (ref 3.5–5.1)
SGOT(AST): 13 U/L — ABNORMAL LOW (ref 15–37)
SGPT (ALT): 25 U/L (ref 12–78)
Sodium: 137 mmol/L (ref 136–145)
Total Protein: 6.4 g/dL (ref 6.4–8.2)

## 2012-04-28 DIAGNOSIS — M8708 Idiopathic aseptic necrosis of bone, other site: Secondary | ICD-10-CM | POA: Diagnosis not present

## 2012-04-28 DIAGNOSIS — G893 Neoplasm related pain (acute) (chronic): Secondary | ICD-10-CM | POA: Diagnosis not present

## 2012-04-28 DIAGNOSIS — R110 Nausea: Secondary | ICD-10-CM | POA: Diagnosis not present

## 2012-04-28 DIAGNOSIS — C9000 Multiple myeloma not having achieved remission: Secondary | ICD-10-CM | POA: Diagnosis not present

## 2012-04-28 LAB — COMPREHENSIVE METABOLIC PANEL
Albumin: 2.6 g/dL — ABNORMAL LOW (ref 3.4–5.0)
Alkaline Phosphatase: 111 U/L (ref 50–136)
BUN: 20 mg/dL — ABNORMAL HIGH (ref 7–18)
Bilirubin,Total: <0.1 mg/dL — ABNORMAL LOW (ref 0.2–1.0)
Chloride: 101 mmol/L (ref 98–107)
Co2: 30 mmol/L (ref 21–32)
Creatinine: 0.84 mg/dL (ref 0.60–1.30)
EGFR (African American): >60
Glucose: 94 mg/dL (ref 65–99)
Osmolality: 276 (ref 275–301)
SGPT (ALT): 21 U/L (ref 12–78)
Sodium: 137 mmol/L (ref 136–145)
Total Protein: 6.1 g/dL — ABNORMAL LOW (ref 6.4–8.2)

## 2012-04-28 LAB — CBC CANCER CENTER
Basophil %: 0.3 %
Eosinophil #: 0.2 x10 3/mm (ref 0.0–0.7)
HCT: 31 % — ABNORMAL LOW (ref 35.0–47.0)
MCV: 99 fL (ref 80–100)
Monocyte #: 0.2 x10 3/mm (ref 0.2–0.9)
Monocyte %: 4.4 %
Neutrophil #: 4.8 x10 3/mm (ref 1.4–6.5)
Platelet: 303 x10 3/mm (ref 150–440)
RBC: 3.12 10*6/uL — ABNORMAL LOW (ref 3.80–5.20)
WBC: 5.7 x10 3/mm (ref 3.6–11.0)

## 2012-04-29 LAB — PROT IMMUNOELECTROPHORES(ARMC)

## 2012-05-10 ENCOUNTER — Ambulatory Visit: Payer: Self-pay | Admitting: Internal Medicine

## 2012-05-10 DIAGNOSIS — D759 Disease of blood and blood-forming organs, unspecified: Secondary | ICD-10-CM | POA: Diagnosis not present

## 2012-05-10 DIAGNOSIS — Z9221 Personal history of antineoplastic chemotherapy: Secondary | ICD-10-CM | POA: Diagnosis not present

## 2012-05-10 DIAGNOSIS — E785 Hyperlipidemia, unspecified: Secondary | ICD-10-CM | POA: Diagnosis not present

## 2012-05-10 DIAGNOSIS — R509 Fever, unspecified: Secondary | ICD-10-CM | POA: Diagnosis not present

## 2012-05-10 DIAGNOSIS — M549 Dorsalgia, unspecified: Secondary | ICD-10-CM | POA: Diagnosis not present

## 2012-05-10 DIAGNOSIS — I4891 Unspecified atrial fibrillation: Secondary | ICD-10-CM | POA: Diagnosis not present

## 2012-05-10 DIAGNOSIS — C9000 Multiple myeloma not having achieved remission: Secondary | ICD-10-CM | POA: Diagnosis not present

## 2012-05-10 DIAGNOSIS — M064 Inflammatory polyarthropathy: Secondary | ICD-10-CM | POA: Diagnosis not present

## 2012-05-10 DIAGNOSIS — K5730 Diverticulosis of large intestine without perforation or abscess without bleeding: Secondary | ICD-10-CM | POA: Diagnosis not present

## 2012-05-10 DIAGNOSIS — Z923 Personal history of irradiation: Secondary | ICD-10-CM | POA: Diagnosis not present

## 2012-05-10 DIAGNOSIS — I2510 Atherosclerotic heart disease of native coronary artery without angina pectoris: Secondary | ICD-10-CM | POA: Diagnosis not present

## 2012-05-10 DIAGNOSIS — Z90710 Acquired absence of both cervix and uterus: Secondary | ICD-10-CM | POA: Diagnosis not present

## 2012-05-10 DIAGNOSIS — Z79899 Other long term (current) drug therapy: Secondary | ICD-10-CM | POA: Diagnosis not present

## 2012-05-10 DIAGNOSIS — M810 Age-related osteoporosis without current pathological fracture: Secondary | ICD-10-CM | POA: Diagnosis not present

## 2012-05-10 DIAGNOSIS — M1990 Unspecified osteoarthritis, unspecified site: Secondary | ICD-10-CM | POA: Diagnosis not present

## 2012-05-10 DIAGNOSIS — Z96659 Presence of unspecified artificial knee joint: Secondary | ICD-10-CM | POA: Diagnosis not present

## 2012-05-10 DIAGNOSIS — R5381 Other malaise: Secondary | ICD-10-CM | POA: Diagnosis not present

## 2012-05-10 DIAGNOSIS — M25559 Pain in unspecified hip: Secondary | ICD-10-CM | POA: Diagnosis not present

## 2012-05-10 DIAGNOSIS — Z993 Dependence on wheelchair: Secondary | ICD-10-CM | POA: Diagnosis not present

## 2012-05-10 DIAGNOSIS — I10 Essential (primary) hypertension: Secondary | ICD-10-CM | POA: Diagnosis not present

## 2012-05-10 DIAGNOSIS — G893 Neoplasm related pain (acute) (chronic): Secondary | ICD-10-CM | POA: Diagnosis not present

## 2012-05-18 DIAGNOSIS — G893 Neoplasm related pain (acute) (chronic): Secondary | ICD-10-CM | POA: Diagnosis not present

## 2012-05-18 DIAGNOSIS — R509 Fever, unspecified: Secondary | ICD-10-CM | POA: Diagnosis not present

## 2012-05-18 DIAGNOSIS — D759 Disease of blood and blood-forming organs, unspecified: Secondary | ICD-10-CM | POA: Diagnosis not present

## 2012-05-18 DIAGNOSIS — C9000 Multiple myeloma not having achieved remission: Secondary | ICD-10-CM | POA: Diagnosis not present

## 2012-05-18 LAB — COMPREHENSIVE METABOLIC PANEL
Alkaline Phosphatase: 133 U/L (ref 50–136)
Anion Gap: 9 (ref 7–16)
Bilirubin,Total: 0.3 mg/dL (ref 0.2–1.0)
Calcium, Total: 8.2 mg/dL — ABNORMAL LOW (ref 8.5–10.1)
Co2: 26 mmol/L (ref 21–32)
EGFR (African American): >60
EGFR (Non-African Amer.): >60
Glucose: 78 mg/dL (ref 65–99)
Osmolality: 274 (ref 275–301)
SGOT(AST): 20 U/L (ref 15–37)
SGPT (ALT): 27 U/L (ref 12–78)
Sodium: 138 mmol/L (ref 136–145)
Total Protein: 6.3 g/dL — ABNORMAL LOW (ref 6.4–8.2)

## 2012-05-18 LAB — CBC CANCER CENTER
Bands: 9 %
MCHC: 33.8 g/dL (ref 32.0–36.0)
Platelet: 328 x10 3/mm (ref 150–440)
RDW: 15.4 % — ABNORMAL HIGH (ref 11.5–14.5)
Segmented Neutrophils: 32 %

## 2012-06-10 ENCOUNTER — Ambulatory Visit: Payer: Self-pay | Admitting: Internal Medicine

## 2012-06-10 DIAGNOSIS — Z9221 Personal history of antineoplastic chemotherapy: Secondary | ICD-10-CM | POA: Diagnosis not present

## 2012-06-10 DIAGNOSIS — M549 Dorsalgia, unspecified: Secondary | ICD-10-CM | POA: Diagnosis not present

## 2012-06-10 DIAGNOSIS — M8708 Idiopathic aseptic necrosis of bone, other site: Secondary | ICD-10-CM | POA: Diagnosis not present

## 2012-06-10 DIAGNOSIS — C9000 Multiple myeloma not having achieved remission: Secondary | ICD-10-CM | POA: Diagnosis not present

## 2012-06-10 DIAGNOSIS — R5381 Other malaise: Secondary | ICD-10-CM | POA: Diagnosis not present

## 2012-06-10 DIAGNOSIS — E785 Hyperlipidemia, unspecified: Secondary | ICD-10-CM | POA: Diagnosis not present

## 2012-06-10 DIAGNOSIS — K5730 Diverticulosis of large intestine without perforation or abscess without bleeding: Secondary | ICD-10-CM | POA: Diagnosis not present

## 2012-06-10 DIAGNOSIS — M5030 Other cervical disc degeneration, unspecified cervical region: Secondary | ICD-10-CM | POA: Diagnosis not present

## 2012-06-10 DIAGNOSIS — Z90710 Acquired absence of both cervix and uterus: Secondary | ICD-10-CM | POA: Diagnosis not present

## 2012-06-10 DIAGNOSIS — M810 Age-related osteoporosis without current pathological fracture: Secondary | ICD-10-CM | POA: Diagnosis not present

## 2012-06-10 DIAGNOSIS — I10 Essential (primary) hypertension: Secondary | ICD-10-CM | POA: Diagnosis not present

## 2012-06-10 DIAGNOSIS — Z79899 Other long term (current) drug therapy: Secondary | ICD-10-CM | POA: Diagnosis not present

## 2012-06-10 DIAGNOSIS — Z923 Personal history of irradiation: Secondary | ICD-10-CM | POA: Diagnosis not present

## 2012-06-10 DIAGNOSIS — I4891 Unspecified atrial fibrillation: Secondary | ICD-10-CM | POA: Diagnosis not present

## 2012-06-10 DIAGNOSIS — M25559 Pain in unspecified hip: Secondary | ICD-10-CM | POA: Diagnosis not present

## 2012-06-10 DIAGNOSIS — Z9289 Personal history of other medical treatment: Secondary | ICD-10-CM

## 2012-06-10 DIAGNOSIS — M5137 Other intervertebral disc degeneration, lumbosacral region: Secondary | ICD-10-CM | POA: Diagnosis not present

## 2012-06-10 DIAGNOSIS — Z96659 Presence of unspecified artificial knee joint: Secondary | ICD-10-CM | POA: Diagnosis not present

## 2012-06-10 DIAGNOSIS — R630 Anorexia: Secondary | ICD-10-CM | POA: Diagnosis not present

## 2012-06-10 DIAGNOSIS — M064 Inflammatory polyarthropathy: Secondary | ICD-10-CM | POA: Diagnosis not present

## 2012-06-10 HISTORY — DX: Personal history of other medical treatment: Z92.89

## 2012-06-16 DIAGNOSIS — C9000 Multiple myeloma not having achieved remission: Secondary | ICD-10-CM | POA: Diagnosis not present

## 2012-06-16 DIAGNOSIS — Z79899 Other long term (current) drug therapy: Secondary | ICD-10-CM | POA: Diagnosis not present

## 2012-06-16 LAB — CBC CANCER CENTER
Basophil #: 0.1 x10 3/mm (ref 0.0–0.1)
Basophil %: 3.5 %
Eosinophil #: 0.2 x10 3/mm (ref 0.0–0.7)
Eosinophil %: 7.6 %
HCT: 28.6 % — ABNORMAL LOW (ref 35.0–47.0)
Lymphocyte #: 0.8 x10 3/mm — ABNORMAL LOW (ref 1.0–3.6)
Lymphocyte %: 26.4 %
MCH: 31.1 pg (ref 26.0–34.0)
MCHC: 33 g/dL (ref 32.0–36.0)
MCV: 94 fL (ref 80–100)
Monocyte #: 1 x10 3/mm — ABNORMAL HIGH (ref 0.2–0.9)
Monocyte %: 35.1 %
Neutrophil #: 0.8 x10 3/mm — ABNORMAL LOW (ref 1.4–6.5)
Neutrophil %: 27.4 %
Platelet: 304 x10 3/mm (ref 150–440)
RBC: 3.03 10*6/uL — ABNORMAL LOW (ref 3.80–5.20)
RDW: 17 % — ABNORMAL HIGH (ref 11.5–14.5)
WBC: 2.9 x10 3/mm — ABNORMAL LOW (ref 3.6–11.0)

## 2012-06-16 LAB — COMPREHENSIVE METABOLIC PANEL
Albumin: 2.8 g/dL — ABNORMAL LOW (ref 3.4–5.0)
Alkaline Phosphatase: 93 U/L (ref 50–136)
BUN: 14 mg/dL (ref 7–18)
Bilirubin,Total: 0.5 mg/dL (ref 0.2–1.0)
Calcium, Total: 8.3 mg/dL — ABNORMAL LOW (ref 8.5–10.1)
Chloride: 106 mmol/L (ref 98–107)
Co2: 26 mmol/L (ref 21–32)
EGFR (African American): >60
EGFR (Non-African Amer.): >60
Glucose: 90 mg/dL (ref 65–99)
Osmolality: 281 (ref 275–301)
SGOT(AST): 14 U/L — ABNORMAL LOW (ref 15–37)
Sodium: 141 mmol/L (ref 136–145)
Total Protein: 6.1 g/dL — ABNORMAL LOW (ref 6.4–8.2)

## 2012-07-03 DIAGNOSIS — H35379 Puckering of macula, unspecified eye: Secondary | ICD-10-CM | POA: Diagnosis not present

## 2012-07-11 ENCOUNTER — Ambulatory Visit: Payer: Self-pay | Admitting: Internal Medicine

## 2012-07-11 DIAGNOSIS — Z79899 Other long term (current) drug therapy: Secondary | ICD-10-CM | POA: Diagnosis not present

## 2012-07-11 DIAGNOSIS — I10 Essential (primary) hypertension: Secondary | ICD-10-CM | POA: Diagnosis not present

## 2012-07-11 DIAGNOSIS — M810 Age-related osteoporosis without current pathological fracture: Secondary | ICD-10-CM | POA: Diagnosis not present

## 2012-07-11 DIAGNOSIS — M8708 Idiopathic aseptic necrosis of bone, other site: Secondary | ICD-10-CM | POA: Diagnosis not present

## 2012-07-11 DIAGNOSIS — Z9221 Personal history of antineoplastic chemotherapy: Secondary | ICD-10-CM | POA: Diagnosis not present

## 2012-07-11 DIAGNOSIS — M5030 Other cervical disc degeneration, unspecified cervical region: Secondary | ICD-10-CM | POA: Diagnosis not present

## 2012-07-11 DIAGNOSIS — E785 Hyperlipidemia, unspecified: Secondary | ICD-10-CM | POA: Diagnosis not present

## 2012-07-11 DIAGNOSIS — C9000 Multiple myeloma not having achieved remission: Secondary | ICD-10-CM | POA: Diagnosis not present

## 2012-07-11 DIAGNOSIS — M25559 Pain in unspecified hip: Secondary | ICD-10-CM | POA: Diagnosis not present

## 2012-07-11 DIAGNOSIS — R630 Anorexia: Secondary | ICD-10-CM | POA: Diagnosis not present

## 2012-07-11 DIAGNOSIS — M5137 Other intervertebral disc degeneration, lumbosacral region: Secondary | ICD-10-CM | POA: Diagnosis not present

## 2012-07-11 DIAGNOSIS — S93609A Unspecified sprain of unspecified foot, initial encounter: Secondary | ICD-10-CM | POA: Diagnosis not present

## 2012-07-11 DIAGNOSIS — I4891 Unspecified atrial fibrillation: Secondary | ICD-10-CM | POA: Diagnosis not present

## 2012-07-11 DIAGNOSIS — Z96659 Presence of unspecified artificial knee joint: Secondary | ICD-10-CM | POA: Diagnosis not present

## 2012-07-11 DIAGNOSIS — D709 Neutropenia, unspecified: Secondary | ICD-10-CM | POA: Diagnosis not present

## 2012-07-11 DIAGNOSIS — K5730 Diverticulosis of large intestine without perforation or abscess without bleeding: Secondary | ICD-10-CM | POA: Diagnosis not present

## 2012-07-11 DIAGNOSIS — M064 Inflammatory polyarthropathy: Secondary | ICD-10-CM | POA: Diagnosis not present

## 2012-07-11 DIAGNOSIS — S93409A Sprain of unspecified ligament of unspecified ankle, initial encounter: Secondary | ICD-10-CM | POA: Diagnosis not present

## 2012-07-11 DIAGNOSIS — M899 Disorder of bone, unspecified: Secondary | ICD-10-CM | POA: Diagnosis not present

## 2012-07-11 DIAGNOSIS — M549 Dorsalgia, unspecified: Secondary | ICD-10-CM | POA: Diagnosis not present

## 2012-07-11 DIAGNOSIS — Z923 Personal history of irradiation: Secondary | ICD-10-CM | POA: Diagnosis not present

## 2012-07-11 DIAGNOSIS — R5381 Other malaise: Secondary | ICD-10-CM | POA: Diagnosis not present

## 2012-07-11 DIAGNOSIS — Z90710 Acquired absence of both cervix and uterus: Secondary | ICD-10-CM | POA: Diagnosis not present

## 2012-07-13 DIAGNOSIS — S93609A Unspecified sprain of unspecified foot, initial encounter: Secondary | ICD-10-CM | POA: Diagnosis not present

## 2012-07-14 DIAGNOSIS — C9000 Multiple myeloma not having achieved remission: Secondary | ICD-10-CM | POA: Diagnosis not present

## 2012-07-14 DIAGNOSIS — M899 Disorder of bone, unspecified: Secondary | ICD-10-CM | POA: Diagnosis not present

## 2012-07-14 DIAGNOSIS — D709 Neutropenia, unspecified: Secondary | ICD-10-CM | POA: Diagnosis not present

## 2012-07-14 DIAGNOSIS — M8708 Idiopathic aseptic necrosis of bone, other site: Secondary | ICD-10-CM | POA: Diagnosis not present

## 2012-07-14 DIAGNOSIS — M949 Disorder of cartilage, unspecified: Secondary | ICD-10-CM | POA: Diagnosis not present

## 2012-07-14 LAB — CBC CANCER CENTER
Basophil #: 0.1 x10 3/mm (ref 0.0–0.1)
Basophil %: 4.4 %
Eosinophil %: 22.8 %
HCT: 31.1 % — ABNORMAL LOW (ref 35.0–47.0)
HGB: 10.2 g/dL — ABNORMAL LOW (ref 12.0–16.0)
Lymphocyte #: 0.5 x10 3/mm — ABNORMAL LOW (ref 1.0–3.6)
MCV: 93 fL (ref 80–100)
Monocyte #: 0.6 x10 3/mm (ref 0.2–0.9)
Monocyte %: 29.9 %
Platelet: 256 x10 3/mm (ref 150–440)
RBC: 3.34 10*6/uL — ABNORMAL LOW (ref 3.80–5.20)
RDW: 17.6 % — ABNORMAL HIGH (ref 11.5–14.5)
WBC: 2.1 x10 3/mm — ABNORMAL LOW (ref 3.6–11.0)

## 2012-07-14 LAB — COMPREHENSIVE METABOLIC PANEL
Alkaline Phosphatase: 129 U/L (ref 50–136)
Bilirubin,Total: 0.5 mg/dL (ref 0.2–1.0)
Calcium, Total: 8.3 mg/dL — ABNORMAL LOW (ref 8.5–10.1)
Chloride: 104 mmol/L (ref 98–107)
Co2: 30 mmol/L (ref 21–32)
Creatinine: 0.61 mg/dL (ref 0.60–1.30)
EGFR (African American): >60
Glucose: 81 mg/dL (ref 65–99)
Osmolality: 279 (ref 275–301)
Sodium: 141 mmol/L (ref 136–145)
Total Protein: 6.5 g/dL (ref 6.4–8.2)

## 2012-07-15 LAB — PROT IMMUNOELECTROPHORES(ARMC)

## 2012-07-15 LAB — KAPPA/LAMBDA FREE LIGHT CHAINS (ARMC)

## 2012-07-27 DIAGNOSIS — S93609A Unspecified sprain of unspecified foot, initial encounter: Secondary | ICD-10-CM | POA: Diagnosis not present

## 2012-08-08 ENCOUNTER — Ambulatory Visit: Payer: Self-pay | Admitting: Internal Medicine

## 2012-08-08 DIAGNOSIS — F341 Dysthymic disorder: Secondary | ICD-10-CM | POA: Diagnosis not present

## 2012-08-08 DIAGNOSIS — Z79899 Other long term (current) drug therapy: Secondary | ICD-10-CM | POA: Diagnosis not present

## 2012-08-08 DIAGNOSIS — M064 Inflammatory polyarthropathy: Secondary | ICD-10-CM | POA: Diagnosis not present

## 2012-08-08 DIAGNOSIS — I4891 Unspecified atrial fibrillation: Secondary | ICD-10-CM | POA: Diagnosis not present

## 2012-08-08 DIAGNOSIS — M810 Age-related osteoporosis without current pathological fracture: Secondary | ICD-10-CM | POA: Diagnosis not present

## 2012-08-08 DIAGNOSIS — Z90710 Acquired absence of both cervix and uterus: Secondary | ICD-10-CM | POA: Diagnosis not present

## 2012-08-08 DIAGNOSIS — M1990 Unspecified osteoarthritis, unspecified site: Secondary | ICD-10-CM | POA: Diagnosis not present

## 2012-08-08 DIAGNOSIS — M8708 Idiopathic aseptic necrosis of bone, other site: Secondary | ICD-10-CM | POA: Diagnosis not present

## 2012-08-08 DIAGNOSIS — K5730 Diverticulosis of large intestine without perforation or abscess without bleeding: Secondary | ICD-10-CM | POA: Diagnosis not present

## 2012-08-08 DIAGNOSIS — M899 Disorder of bone, unspecified: Secondary | ICD-10-CM | POA: Diagnosis not present

## 2012-08-08 DIAGNOSIS — E785 Hyperlipidemia, unspecified: Secondary | ICD-10-CM | POA: Diagnosis not present

## 2012-08-08 DIAGNOSIS — I10 Essential (primary) hypertension: Secondary | ICD-10-CM | POA: Diagnosis not present

## 2012-08-08 DIAGNOSIS — Z96659 Presence of unspecified artificial knee joint: Secondary | ICD-10-CM | POA: Diagnosis not present

## 2012-08-08 DIAGNOSIS — Z9221 Personal history of antineoplastic chemotherapy: Secondary | ICD-10-CM | POA: Diagnosis not present

## 2012-08-08 DIAGNOSIS — M549 Dorsalgia, unspecified: Secondary | ICD-10-CM | POA: Diagnosis not present

## 2012-08-08 DIAGNOSIS — R5381 Other malaise: Secondary | ICD-10-CM | POA: Diagnosis not present

## 2012-08-08 DIAGNOSIS — M25559 Pain in unspecified hip: Secondary | ICD-10-CM | POA: Diagnosis not present

## 2012-08-08 DIAGNOSIS — Z923 Personal history of irradiation: Secondary | ICD-10-CM | POA: Diagnosis not present

## 2012-08-08 DIAGNOSIS — C9000 Multiple myeloma not having achieved remission: Secondary | ICD-10-CM | POA: Diagnosis not present

## 2012-08-11 DIAGNOSIS — M8708 Idiopathic aseptic necrosis of bone, other site: Secondary | ICD-10-CM | POA: Diagnosis not present

## 2012-08-11 DIAGNOSIS — C9000 Multiple myeloma not having achieved remission: Secondary | ICD-10-CM | POA: Diagnosis not present

## 2012-08-11 LAB — CBC CANCER CENTER
Basophil %: 4.2 %
HCT: 36.1 % (ref 35.0–47.0)
HGB: 11.8 g/dL — ABNORMAL LOW (ref 12.0–16.0)
Lymphocyte #: 0.6 x10 3/mm — ABNORMAL LOW (ref 1.0–3.6)
Lymphocyte %: 25.3 %
MCHC: 32.6 g/dL (ref 32.0–36.0)
Monocyte #: 0.6 x10 3/mm (ref 0.2–0.9)
Monocyte %: 25.1 %
Neutrophil #: 0.6 x10 3/mm — ABNORMAL LOW (ref 1.4–6.5)
Neutrophil %: 28.1 %
WBC: 2.3 x10 3/mm — ABNORMAL LOW (ref 3.6–11.0)

## 2012-08-11 LAB — COMPREHENSIVE METABOLIC PANEL
Anion Gap: 10 (ref 7–16)
BUN: 16 mg/dL (ref 7–18)
Bilirubin,Total: 0.4 mg/dL (ref 0.2–1.0)
Chloride: 104 mmol/L (ref 98–107)
EGFR (African American): >60
Glucose: 82 mg/dL (ref 65–99)
Osmolality: 285 (ref 275–301)
Potassium: 3.8 mmol/L (ref 3.5–5.1)
SGOT(AST): 13 U/L — ABNORMAL LOW (ref 15–37)
SGPT (ALT): 17 U/L (ref 12–78)

## 2012-08-13 LAB — KAPPA/LAMBDA FREE LIGHT CHAINS (ARMC)

## 2012-08-13 LAB — PROT IMMUNOELECTROPHORES(ARMC)

## 2012-09-03 DIAGNOSIS — N362 Urethral caruncle: Secondary | ICD-10-CM | POA: Diagnosis not present

## 2012-09-03 DIAGNOSIS — R339 Retention of urine, unspecified: Secondary | ICD-10-CM | POA: Diagnosis not present

## 2012-09-03 DIAGNOSIS — N393 Stress incontinence (female) (male): Secondary | ICD-10-CM | POA: Diagnosis not present

## 2012-09-08 ENCOUNTER — Ambulatory Visit: Payer: Self-pay | Admitting: Oncology

## 2012-09-08 DIAGNOSIS — E785 Hyperlipidemia, unspecified: Secondary | ICD-10-CM | POA: Diagnosis not present

## 2012-09-08 DIAGNOSIS — I4891 Unspecified atrial fibrillation: Secondary | ICD-10-CM | POA: Diagnosis not present

## 2012-09-08 DIAGNOSIS — F341 Dysthymic disorder: Secondary | ICD-10-CM | POA: Diagnosis not present

## 2012-09-08 DIAGNOSIS — D702 Other drug-induced agranulocytosis: Secondary | ICD-10-CM | POA: Diagnosis not present

## 2012-09-08 DIAGNOSIS — R059 Cough, unspecified: Secondary | ICD-10-CM | POA: Diagnosis not present

## 2012-09-08 DIAGNOSIS — M899 Disorder of bone, unspecified: Secondary | ICD-10-CM | POA: Diagnosis not present

## 2012-09-08 DIAGNOSIS — Z9221 Personal history of antineoplastic chemotherapy: Secondary | ICD-10-CM | POA: Diagnosis not present

## 2012-09-08 DIAGNOSIS — K5730 Diverticulosis of large intestine without perforation or abscess without bleeding: Secondary | ICD-10-CM | POA: Diagnosis not present

## 2012-09-08 DIAGNOSIS — M8708 Idiopathic aseptic necrosis of bone, other site: Secondary | ICD-10-CM | POA: Diagnosis not present

## 2012-09-08 DIAGNOSIS — R51 Headache: Secondary | ICD-10-CM | POA: Diagnosis not present

## 2012-09-08 DIAGNOSIS — Z79899 Other long term (current) drug therapy: Secondary | ICD-10-CM | POA: Diagnosis not present

## 2012-09-08 DIAGNOSIS — I10 Essential (primary) hypertension: Secondary | ICD-10-CM | POA: Diagnosis not present

## 2012-09-08 DIAGNOSIS — M1990 Unspecified osteoarthritis, unspecified site: Secondary | ICD-10-CM | POA: Diagnosis not present

## 2012-09-08 DIAGNOSIS — R5381 Other malaise: Secondary | ICD-10-CM | POA: Diagnosis not present

## 2012-09-08 DIAGNOSIS — C9000 Multiple myeloma not having achieved remission: Secondary | ICD-10-CM | POA: Diagnosis not present

## 2012-09-08 DIAGNOSIS — M549 Dorsalgia, unspecified: Secondary | ICD-10-CM | POA: Diagnosis not present

## 2012-09-08 DIAGNOSIS — M064 Inflammatory polyarthropathy: Secondary | ICD-10-CM | POA: Diagnosis not present

## 2012-09-08 DIAGNOSIS — G8929 Other chronic pain: Secondary | ICD-10-CM | POA: Diagnosis not present

## 2012-09-08 DIAGNOSIS — Z90710 Acquired absence of both cervix and uterus: Secondary | ICD-10-CM | POA: Diagnosis not present

## 2012-09-08 DIAGNOSIS — Z923 Personal history of irradiation: Secondary | ICD-10-CM | POA: Diagnosis not present

## 2012-09-08 DIAGNOSIS — M810 Age-related osteoporosis without current pathological fracture: Secondary | ICD-10-CM | POA: Diagnosis not present

## 2012-09-08 DIAGNOSIS — J309 Allergic rhinitis, unspecified: Secondary | ICD-10-CM | POA: Diagnosis not present

## 2012-09-08 DIAGNOSIS — Z96659 Presence of unspecified artificial knee joint: Secondary | ICD-10-CM | POA: Diagnosis not present

## 2012-09-08 LAB — COMPREHENSIVE METABOLIC PANEL
Albumin: 3.2 g/dL — ABNORMAL LOW (ref 3.4–5.0)
Anion Gap: 6 — ABNORMAL LOW (ref 7–16)
BUN: 12 mg/dL (ref 7–18)
Chloride: 106 mmol/L (ref 98–107)
Co2: 30 mmol/L (ref 21–32)
EGFR (African American): >60
EGFR (Non-African Amer.): >60
Glucose: 81 mg/dL (ref 65–99)
Potassium: 4.4 mmol/L (ref 3.5–5.1)
SGPT (ALT): 21 U/L (ref 12–78)
Total Protein: 6.3 g/dL — ABNORMAL LOW (ref 6.4–8.2)

## 2012-09-08 LAB — CBC CANCER CENTER
Basophil #: 0.1 x10 3/mm (ref 0.0–0.1)
Basophil %: 3.4 %
Eosinophil #: 0.1 x10 3/mm (ref 0.0–0.7)
HGB: 11.2 g/dL — ABNORMAL LOW (ref 12.0–16.0)
Lymphocyte %: 25.9 %
MCV: 91 fL (ref 80–100)
Monocyte %: 27.1 %
Neutrophil %: 36.2 %

## 2012-09-09 LAB — KAPPA/LAMBDA FREE LIGHT CHAINS (ARMC)

## 2012-09-11 DIAGNOSIS — H35379 Puckering of macula, unspecified eye: Secondary | ICD-10-CM | POA: Diagnosis not present

## 2012-09-23 DIAGNOSIS — I059 Rheumatic mitral valve disease, unspecified: Secondary | ICD-10-CM | POA: Diagnosis not present

## 2012-09-23 DIAGNOSIS — I2510 Atherosclerotic heart disease of native coronary artery without angina pectoris: Secondary | ICD-10-CM | POA: Diagnosis not present

## 2012-09-23 DIAGNOSIS — I119 Hypertensive heart disease without heart failure: Secondary | ICD-10-CM | POA: Diagnosis not present

## 2012-09-23 DIAGNOSIS — I495 Sick sinus syndrome: Secondary | ICD-10-CM | POA: Diagnosis not present

## 2012-10-01 ENCOUNTER — Ambulatory Visit: Payer: Self-pay | Admitting: Ophthalmology

## 2012-10-01 DIAGNOSIS — I499 Cardiac arrhythmia, unspecified: Secondary | ICD-10-CM | POA: Diagnosis not present

## 2012-10-01 DIAGNOSIS — H35379 Puckering of macula, unspecified eye: Secondary | ICD-10-CM | POA: Diagnosis not present

## 2012-10-01 DIAGNOSIS — Z01810 Encounter for preprocedural cardiovascular examination: Secondary | ICD-10-CM | POA: Diagnosis not present

## 2012-10-06 DIAGNOSIS — D702 Other drug-induced agranulocytosis: Secondary | ICD-10-CM | POA: Diagnosis not present

## 2012-10-06 DIAGNOSIS — C9000 Multiple myeloma not having achieved remission: Secondary | ICD-10-CM | POA: Diagnosis not present

## 2012-10-06 DIAGNOSIS — R51 Headache: Secondary | ICD-10-CM | POA: Diagnosis not present

## 2012-10-06 DIAGNOSIS — M8708 Idiopathic aseptic necrosis of bone, other site: Secondary | ICD-10-CM | POA: Diagnosis not present

## 2012-10-06 LAB — CBC CANCER CENTER
Eosinophil %: 11.1 %
HCT: 35.6 % (ref 35.0–47.0)
Lymphocyte %: 13.2 %
MCH: 29.1 pg (ref 26.0–34.0)
MCHC: 31.8 g/dL — ABNORMAL LOW (ref 32.0–36.0)
Monocyte #: 1.1 x10 3/mm — ABNORMAL HIGH (ref 0.2–0.9)
Neutrophil #: 0.7 x10 3/mm — ABNORMAL LOW (ref 1.4–6.5)
Neutrophil %: 27.9 %

## 2012-10-06 LAB — COMPREHENSIVE METABOLIC PANEL
Alkaline Phosphatase: 83 U/L (ref 50–136)
Anion Gap: 6 — ABNORMAL LOW (ref 7–16)
BUN: 17 mg/dL (ref 7–18)
Bilirubin,Total: 0.4 mg/dL (ref 0.2–1.0)
Chloride: 102 mmol/L (ref 98–107)
Creatinine: 0.78 mg/dL (ref 0.60–1.30)
EGFR (Non-African Amer.): >60
Glucose: 84 mg/dL (ref 65–99)
Potassium: 4.3 mmol/L (ref 3.5–5.1)
SGOT(AST): 12 U/L — ABNORMAL LOW (ref 15–37)
SGPT (ALT): 20 U/L (ref 12–78)

## 2012-10-07 ENCOUNTER — Ambulatory Visit: Payer: Self-pay | Admitting: Ophthalmology

## 2012-10-07 DIAGNOSIS — H35379 Puckering of macula, unspecified eye: Secondary | ICD-10-CM | POA: Diagnosis not present

## 2012-10-07 DIAGNOSIS — C9000 Multiple myeloma not having achieved remission: Secondary | ICD-10-CM | POA: Diagnosis not present

## 2012-10-07 DIAGNOSIS — R609 Edema, unspecified: Secondary | ICD-10-CM | POA: Diagnosis not present

## 2012-10-07 DIAGNOSIS — Z882 Allergy status to sulfonamides status: Secondary | ICD-10-CM | POA: Diagnosis not present

## 2012-10-07 DIAGNOSIS — M069 Rheumatoid arthritis, unspecified: Secondary | ICD-10-CM | POA: Diagnosis not present

## 2012-10-07 DIAGNOSIS — Z96659 Presence of unspecified artificial knee joint: Secondary | ICD-10-CM | POA: Diagnosis not present

## 2012-10-07 DIAGNOSIS — H3320 Serous retinal detachment, unspecified eye: Secondary | ICD-10-CM | POA: Diagnosis not present

## 2012-10-07 DIAGNOSIS — Z881 Allergy status to other antibiotic agents status: Secondary | ICD-10-CM | POA: Diagnosis not present

## 2012-10-07 DIAGNOSIS — M810 Age-related osteoporosis without current pathological fracture: Secondary | ICD-10-CM | POA: Diagnosis not present

## 2012-10-07 DIAGNOSIS — I2510 Atherosclerotic heart disease of native coronary artery without angina pectoris: Secondary | ICD-10-CM | POA: Diagnosis not present

## 2012-10-07 DIAGNOSIS — H43319 Vitreous membranes and strands, unspecified eye: Secondary | ICD-10-CM | POA: Diagnosis not present

## 2012-10-07 DIAGNOSIS — R011 Cardiac murmur, unspecified: Secondary | ICD-10-CM | POA: Diagnosis not present

## 2012-10-07 DIAGNOSIS — I4891 Unspecified atrial fibrillation: Secondary | ICD-10-CM | POA: Diagnosis not present

## 2012-10-07 DIAGNOSIS — Z79899 Other long term (current) drug therapy: Secondary | ICD-10-CM | POA: Diagnosis not present

## 2012-10-07 DIAGNOSIS — H3581 Retinal edema: Secondary | ICD-10-CM | POA: Diagnosis not present

## 2012-10-08 ENCOUNTER — Ambulatory Visit: Payer: Self-pay | Admitting: Internal Medicine

## 2012-10-08 DIAGNOSIS — R5381 Other malaise: Secondary | ICD-10-CM | POA: Diagnosis not present

## 2012-10-08 DIAGNOSIS — M899 Disorder of bone, unspecified: Secondary | ICD-10-CM | POA: Diagnosis not present

## 2012-10-08 DIAGNOSIS — I4891 Unspecified atrial fibrillation: Secondary | ICD-10-CM | POA: Diagnosis not present

## 2012-10-08 DIAGNOSIS — I10 Essential (primary) hypertension: Secondary | ICD-10-CM | POA: Diagnosis not present

## 2012-10-08 DIAGNOSIS — Z9221 Personal history of antineoplastic chemotherapy: Secondary | ICD-10-CM | POA: Diagnosis not present

## 2012-10-08 DIAGNOSIS — M810 Age-related osteoporosis without current pathological fracture: Secondary | ICD-10-CM | POA: Diagnosis not present

## 2012-10-08 DIAGNOSIS — F341 Dysthymic disorder: Secondary | ICD-10-CM | POA: Diagnosis not present

## 2012-10-08 DIAGNOSIS — Z96659 Presence of unspecified artificial knee joint: Secondary | ICD-10-CM | POA: Diagnosis not present

## 2012-10-08 DIAGNOSIS — Z90710 Acquired absence of both cervix and uterus: Secondary | ICD-10-CM | POA: Diagnosis not present

## 2012-10-08 DIAGNOSIS — D702 Other drug-induced agranulocytosis: Secondary | ICD-10-CM | POA: Diagnosis not present

## 2012-10-08 DIAGNOSIS — M25559 Pain in unspecified hip: Secondary | ICD-10-CM | POA: Diagnosis not present

## 2012-10-08 DIAGNOSIS — C9000 Multiple myeloma not having achieved remission: Secondary | ICD-10-CM | POA: Diagnosis not present

## 2012-10-08 DIAGNOSIS — J309 Allergic rhinitis, unspecified: Secondary | ICD-10-CM | POA: Diagnosis not present

## 2012-10-08 DIAGNOSIS — M064 Inflammatory polyarthropathy: Secondary | ICD-10-CM | POA: Diagnosis not present

## 2012-10-08 DIAGNOSIS — K5730 Diverticulosis of large intestine without perforation or abscess without bleeding: Secondary | ICD-10-CM | POA: Diagnosis not present

## 2012-10-08 DIAGNOSIS — Z923 Personal history of irradiation: Secondary | ICD-10-CM | POA: Diagnosis not present

## 2012-10-08 DIAGNOSIS — E785 Hyperlipidemia, unspecified: Secondary | ICD-10-CM | POA: Diagnosis not present

## 2012-10-08 DIAGNOSIS — Z79899 Other long term (current) drug therapy: Secondary | ICD-10-CM | POA: Diagnosis not present

## 2012-10-08 DIAGNOSIS — M1990 Unspecified osteoarthritis, unspecified site: Secondary | ICD-10-CM | POA: Diagnosis not present

## 2012-10-08 DIAGNOSIS — M8708 Idiopathic aseptic necrosis of bone, other site: Secondary | ICD-10-CM | POA: Diagnosis not present

## 2012-10-08 DIAGNOSIS — M549 Dorsalgia, unspecified: Secondary | ICD-10-CM | POA: Diagnosis not present

## 2012-10-20 DIAGNOSIS — I10 Essential (primary) hypertension: Secondary | ICD-10-CM | POA: Diagnosis not present

## 2012-10-20 DIAGNOSIS — I059 Rheumatic mitral valve disease, unspecified: Secondary | ICD-10-CM | POA: Diagnosis not present

## 2012-10-20 DIAGNOSIS — E785 Hyperlipidemia, unspecified: Secondary | ICD-10-CM | POA: Diagnosis not present

## 2012-10-20 DIAGNOSIS — I4891 Unspecified atrial fibrillation: Secondary | ICD-10-CM | POA: Diagnosis not present

## 2012-11-03 DIAGNOSIS — C9000 Multiple myeloma not having achieved remission: Secondary | ICD-10-CM | POA: Diagnosis not present

## 2012-11-03 DIAGNOSIS — M8708 Idiopathic aseptic necrosis of bone, other site: Secondary | ICD-10-CM | POA: Diagnosis not present

## 2012-11-03 DIAGNOSIS — D702 Other drug-induced agranulocytosis: Secondary | ICD-10-CM | POA: Diagnosis not present

## 2012-11-03 LAB — COMPREHENSIVE METABOLIC PANEL
Alkaline Phosphatase: 79 U/L (ref 50–136)
BUN: 23 mg/dL — ABNORMAL HIGH (ref 7–18)
Bilirubin,Total: 0.7 mg/dL (ref 0.2–1.0)
Calcium, Total: 9.1 mg/dL (ref 8.5–10.1)
Chloride: 103 mmol/L (ref 98–107)
Co2: 27 mmol/L (ref 21–32)
Creatinine: 0.85 mg/dL (ref 0.60–1.30)
EGFR (African American): >60
Glucose: 85 mg/dL (ref 65–99)
Osmolality: 284 (ref 275–301)
Potassium: 3.7 mmol/L (ref 3.5–5.1)
SGOT(AST): 13 U/L — ABNORMAL LOW (ref 15–37)
SGPT (ALT): 21 U/L (ref 12–78)
Sodium: 141 mmol/L (ref 136–145)
Total Protein: 6.7 g/dL (ref 6.4–8.2)

## 2012-11-03 LAB — CBC CANCER CENTER
Basophil #: 0 x10 3/mm (ref 0.0–0.1)
Basophil %: 1.8 %
HCT: 34.9 % — ABNORMAL LOW (ref 35.0–47.0)
HGB: 11.4 g/dL — ABNORMAL LOW (ref 12.0–16.0)
Lymphocyte #: 0.6 x10 3/mm — ABNORMAL LOW (ref 1.0–3.6)
Lymphocyte %: 21.3 %
MCHC: 32.8 g/dL (ref 32.0–36.0)
MCV: 94 fL (ref 80–100)
Monocyte #: 0.6 x10 3/mm (ref 0.2–0.9)
Monocyte %: 22.4 %
Neutrophil #: 1.1 x10 3/mm — ABNORMAL LOW (ref 1.4–6.5)
RDW: 17.4 % — ABNORMAL HIGH (ref 11.5–14.5)
WBC: 2.6 x10 3/mm — ABNORMAL LOW (ref 3.6–11.0)

## 2012-11-04 DIAGNOSIS — E785 Hyperlipidemia, unspecified: Secondary | ICD-10-CM | POA: Diagnosis not present

## 2012-11-04 DIAGNOSIS — I059 Rheumatic mitral valve disease, unspecified: Secondary | ICD-10-CM | POA: Diagnosis not present

## 2012-11-04 DIAGNOSIS — I10 Essential (primary) hypertension: Secondary | ICD-10-CM | POA: Diagnosis not present

## 2012-11-04 DIAGNOSIS — I4891 Unspecified atrial fibrillation: Secondary | ICD-10-CM | POA: Diagnosis not present

## 2012-11-05 LAB — KAPPA/LAMBDA FREE LIGHT CHAINS (ARMC)

## 2012-11-06 DIAGNOSIS — H35379 Puckering of macula, unspecified eye: Secondary | ICD-10-CM | POA: Diagnosis not present

## 2012-11-08 ENCOUNTER — Ambulatory Visit: Payer: Self-pay | Admitting: Internal Medicine

## 2012-12-14 ENCOUNTER — Ambulatory Visit: Payer: Self-pay | Admitting: Oncology

## 2012-12-14 DIAGNOSIS — R5381 Other malaise: Secondary | ICD-10-CM | POA: Diagnosis not present

## 2012-12-14 DIAGNOSIS — M810 Age-related osteoporosis without current pathological fracture: Secondary | ICD-10-CM | POA: Diagnosis not present

## 2012-12-14 DIAGNOSIS — K5730 Diverticulosis of large intestine without perforation or abscess without bleeding: Secondary | ICD-10-CM | POA: Diagnosis not present

## 2012-12-14 DIAGNOSIS — M064 Inflammatory polyarthropathy: Secondary | ICD-10-CM | POA: Diagnosis not present

## 2012-12-14 DIAGNOSIS — C9000 Multiple myeloma not having achieved remission: Secondary | ICD-10-CM | POA: Diagnosis not present

## 2012-12-14 DIAGNOSIS — I4891 Unspecified atrial fibrillation: Secondary | ICD-10-CM | POA: Diagnosis not present

## 2012-12-14 DIAGNOSIS — F329 Major depressive disorder, single episode, unspecified: Secondary | ICD-10-CM | POA: Diagnosis not present

## 2012-12-14 DIAGNOSIS — I10 Essential (primary) hypertension: Secondary | ICD-10-CM | POA: Diagnosis not present

## 2012-12-14 DIAGNOSIS — Z90710 Acquired absence of both cervix and uterus: Secondary | ICD-10-CM | POA: Diagnosis not present

## 2012-12-14 DIAGNOSIS — R262 Difficulty in walking, not elsewhere classified: Secondary | ICD-10-CM | POA: Diagnosis not present

## 2012-12-14 DIAGNOSIS — M549 Dorsalgia, unspecified: Secondary | ICD-10-CM | POA: Diagnosis not present

## 2012-12-14 DIAGNOSIS — E785 Hyperlipidemia, unspecified: Secondary | ICD-10-CM | POA: Diagnosis not present

## 2012-12-14 DIAGNOSIS — Z79899 Other long term (current) drug therapy: Secondary | ICD-10-CM | POA: Diagnosis not present

## 2012-12-14 DIAGNOSIS — M25519 Pain in unspecified shoulder: Secondary | ICD-10-CM | POA: Diagnosis not present

## 2012-12-14 DIAGNOSIS — F411 Generalized anxiety disorder: Secondary | ICD-10-CM | POA: Diagnosis not present

## 2012-12-14 DIAGNOSIS — Z9221 Personal history of antineoplastic chemotherapy: Secondary | ICD-10-CM | POA: Diagnosis not present

## 2012-12-14 DIAGNOSIS — M8708 Idiopathic aseptic necrosis of bone, other site: Secondary | ICD-10-CM | POA: Diagnosis not present

## 2012-12-14 DIAGNOSIS — M25559 Pain in unspecified hip: Secondary | ICD-10-CM | POA: Diagnosis not present

## 2012-12-14 DIAGNOSIS — D708 Other neutropenia: Secondary | ICD-10-CM | POA: Diagnosis not present

## 2012-12-14 DIAGNOSIS — Z96659 Presence of unspecified artificial knee joint: Secondary | ICD-10-CM | POA: Diagnosis not present

## 2012-12-15 DIAGNOSIS — R5381 Other malaise: Secondary | ICD-10-CM | POA: Diagnosis not present

## 2012-12-15 DIAGNOSIS — R262 Difficulty in walking, not elsewhere classified: Secondary | ICD-10-CM | POA: Diagnosis not present

## 2012-12-15 DIAGNOSIS — C9000 Multiple myeloma not having achieved remission: Secondary | ICD-10-CM | POA: Diagnosis not present

## 2012-12-15 DIAGNOSIS — D708 Other neutropenia: Secondary | ICD-10-CM | POA: Diagnosis not present

## 2012-12-15 DIAGNOSIS — M8708 Idiopathic aseptic necrosis of bone, other site: Secondary | ICD-10-CM | POA: Diagnosis not present

## 2012-12-15 DIAGNOSIS — M810 Age-related osteoporosis without current pathological fracture: Secondary | ICD-10-CM | POA: Diagnosis not present

## 2012-12-15 LAB — COMPREHENSIVE METABOLIC PANEL
Albumin: 3.2 g/dL — ABNORMAL LOW (ref 3.4–5.0)
Alkaline Phosphatase: 86 U/L (ref 50–136)
Bilirubin,Total: 0.3 mg/dL (ref 0.2–1.0)
Calcium, Total: 8.7 mg/dL (ref 8.5–10.1)
Co2: 29 mmol/L (ref 21–32)
EGFR (African American): >60
EGFR (Non-African Amer.): >60
Osmolality: 282 (ref 275–301)
SGPT (ALT): 20 U/L (ref 12–78)
Total Protein: 6 g/dL — ABNORMAL LOW (ref 6.4–8.2)

## 2012-12-15 LAB — CBC CANCER CENTER
Basophil #: 0.1 x10 3/mm (ref 0.0–0.1)
Basophil %: 2.7 %
Eosinophil #: 0.3 x10 3/mm (ref 0.0–0.7)
Eosinophil %: 6.1 %
HCT: 31.7 % — ABNORMAL LOW (ref 35.0–47.0)
Lymphocyte #: 0.9 x10 3/mm — ABNORMAL LOW (ref 1.0–3.6)
Lymphocyte %: 20.4 %
MCH: 31.5 pg (ref 26.0–34.0)
MCHC: 34.2 g/dL (ref 32.0–36.0)
Monocyte #: 1.3 x10 3/mm — ABNORMAL HIGH (ref 0.2–0.9)
Monocyte %: 31.9 %
Neutrophil #: 1.6 x10 3/mm (ref 1.4–6.5)
Platelet: 306 x10 3/mm (ref 150–440)
RBC: 3.45 10*6/uL — ABNORMAL LOW (ref 3.80–5.20)

## 2012-12-28 DIAGNOSIS — H3581 Retinal edema: Secondary | ICD-10-CM | POA: Diagnosis not present

## 2013-01-05 DIAGNOSIS — M810 Age-related osteoporosis without current pathological fracture: Secondary | ICD-10-CM | POA: Diagnosis not present

## 2013-01-05 DIAGNOSIS — R5383 Other fatigue: Secondary | ICD-10-CM | POA: Diagnosis not present

## 2013-01-05 DIAGNOSIS — R262 Difficulty in walking, not elsewhere classified: Secondary | ICD-10-CM | POA: Diagnosis not present

## 2013-01-05 DIAGNOSIS — C9000 Multiple myeloma not having achieved remission: Secondary | ICD-10-CM | POA: Diagnosis not present

## 2013-01-05 DIAGNOSIS — M8708 Idiopathic aseptic necrosis of bone, other site: Secondary | ICD-10-CM | POA: Diagnosis not present

## 2013-01-05 DIAGNOSIS — D708 Other neutropenia: Secondary | ICD-10-CM | POA: Diagnosis not present

## 2013-01-05 LAB — COMPREHENSIVE METABOLIC PANEL
Albumin: 3.2 g/dL — ABNORMAL LOW (ref 3.4–5.0)
BUN: 16 mg/dL (ref 7–18)
Bilirubin,Total: 0.5 mg/dL (ref 0.2–1.0)
Calcium, Total: 8.5 mg/dL (ref 8.5–10.1)
Chloride: 107 mmol/L (ref 98–107)
Creatinine: 0.81 mg/dL (ref 0.60–1.30)
EGFR (Non-African Amer.): >60
Osmolality: 287 (ref 275–301)
SGOT(AST): 14 U/L — ABNORMAL LOW (ref 15–37)
SGPT (ALT): 17 U/L (ref 12–78)
Total Protein: 6.1 g/dL — ABNORMAL LOW (ref 6.4–8.2)

## 2013-01-05 LAB — CBC CANCER CENTER
Eosinophil #: 0.1 x10 3/mm (ref 0.0–0.7)
HCT: 31.9 % — ABNORMAL LOW (ref 35.0–47.0)
Lymphocyte %: 27.5 %
Monocyte #: 0.8 x10 3/mm (ref 0.2–0.9)
Neutrophil #: 0.7 x10 3/mm — ABNORMAL LOW (ref 1.4–6.5)
Neutrophil %: 30.5 %
Platelet: 235 x10 3/mm (ref 150–440)
RBC: 3.44 10*6/uL — ABNORMAL LOW (ref 3.80–5.20)
WBC: 2.4 x10 3/mm — ABNORMAL LOW (ref 3.6–11.0)

## 2013-01-06 LAB — PROT IMMUNOELECTROPHORES(ARMC)

## 2013-01-06 LAB — KAPPA/LAMBDA FREE LIGHT CHAINS (ARMC)

## 2013-01-08 ENCOUNTER — Ambulatory Visit: Payer: Self-pay | Admitting: Oncology

## 2013-01-08 DIAGNOSIS — Z96659 Presence of unspecified artificial knee joint: Secondary | ICD-10-CM | POA: Diagnosis not present

## 2013-01-08 DIAGNOSIS — Z9221 Personal history of antineoplastic chemotherapy: Secondary | ICD-10-CM | POA: Diagnosis not present

## 2013-01-08 DIAGNOSIS — M545 Low back pain, unspecified: Secondary | ICD-10-CM | POA: Diagnosis not present

## 2013-01-08 DIAGNOSIS — M810 Age-related osteoporosis without current pathological fracture: Secondary | ICD-10-CM | POA: Diagnosis not present

## 2013-01-08 DIAGNOSIS — K5730 Diverticulosis of large intestine without perforation or abscess without bleeding: Secondary | ICD-10-CM | POA: Diagnosis not present

## 2013-01-08 DIAGNOSIS — Z79899 Other long term (current) drug therapy: Secondary | ICD-10-CM | POA: Diagnosis not present

## 2013-01-08 DIAGNOSIS — I2510 Atherosclerotic heart disease of native coronary artery without angina pectoris: Secondary | ICD-10-CM | POA: Diagnosis not present

## 2013-01-08 DIAGNOSIS — E785 Hyperlipidemia, unspecified: Secondary | ICD-10-CM | POA: Diagnosis not present

## 2013-01-08 DIAGNOSIS — R5381 Other malaise: Secondary | ICD-10-CM | POA: Diagnosis not present

## 2013-01-08 DIAGNOSIS — F411 Generalized anxiety disorder: Secondary | ICD-10-CM | POA: Diagnosis not present

## 2013-01-08 DIAGNOSIS — C9000 Multiple myeloma not having achieved remission: Secondary | ICD-10-CM | POA: Diagnosis not present

## 2013-01-08 DIAGNOSIS — M064 Inflammatory polyarthropathy: Secondary | ICD-10-CM | POA: Diagnosis not present

## 2013-01-08 DIAGNOSIS — D702 Other drug-induced agranulocytosis: Secondary | ICD-10-CM | POA: Diagnosis not present

## 2013-01-08 DIAGNOSIS — I4891 Unspecified atrial fibrillation: Secondary | ICD-10-CM | POA: Diagnosis not present

## 2013-01-08 DIAGNOSIS — Z923 Personal history of irradiation: Secondary | ICD-10-CM | POA: Diagnosis not present

## 2013-01-08 DIAGNOSIS — Z90710 Acquired absence of both cervix and uterus: Secondary | ICD-10-CM | POA: Diagnosis not present

## 2013-01-08 DIAGNOSIS — M8708 Idiopathic aseptic necrosis of bone, other site: Secondary | ICD-10-CM | POA: Diagnosis not present

## 2013-01-08 DIAGNOSIS — M25559 Pain in unspecified hip: Secondary | ICD-10-CM | POA: Diagnosis not present

## 2013-01-14 DIAGNOSIS — R300 Dysuria: Secondary | ICD-10-CM | POA: Diagnosis not present

## 2013-01-14 DIAGNOSIS — N390 Urinary tract infection, site not specified: Secondary | ICD-10-CM | POA: Diagnosis not present

## 2013-01-26 DIAGNOSIS — I495 Sick sinus syndrome: Secondary | ICD-10-CM | POA: Diagnosis not present

## 2013-01-26 DIAGNOSIS — R5381 Other malaise: Secondary | ICD-10-CM | POA: Diagnosis not present

## 2013-01-26 DIAGNOSIS — I119 Hypertensive heart disease without heart failure: Secondary | ICD-10-CM | POA: Diagnosis not present

## 2013-01-26 DIAGNOSIS — I059 Rheumatic mitral valve disease, unspecified: Secondary | ICD-10-CM | POA: Diagnosis not present

## 2013-02-02 DIAGNOSIS — M8708 Idiopathic aseptic necrosis of bone, other site: Secondary | ICD-10-CM | POA: Diagnosis not present

## 2013-02-02 DIAGNOSIS — D702 Other drug-induced agranulocytosis: Secondary | ICD-10-CM | POA: Diagnosis not present

## 2013-02-02 DIAGNOSIS — C9000 Multiple myeloma not having achieved remission: Secondary | ICD-10-CM | POA: Diagnosis not present

## 2013-02-02 LAB — CBC CANCER CENTER
Basophil %: 3.8 %
Eosinophil #: 0.2 x10 3/mm (ref 0.0–0.7)
Eosinophil %: 12.1 %
HCT: 33.2 % — ABNORMAL LOW (ref 35.0–47.0)
HGB: 11.5 g/dL — ABNORMAL LOW (ref 12.0–16.0)
Lymphocyte %: 31.5 %
MCH: 32.1 pg (ref 26.0–34.0)
MCHC: 34.6 g/dL (ref 32.0–36.0)
Monocyte #: 0.6 x10 3/mm (ref 0.2–0.9)
Monocyte %: 32.2 %
Neutrophil #: 0.4 x10 3/mm — ABNORMAL LOW (ref 1.4–6.5)
Neutrophil %: 20.4 %
Platelet: 188 x10 3/mm (ref 150–440)
RBC: 3.58 10*6/uL — ABNORMAL LOW (ref 3.80–5.20)
WBC: 2 x10 3/mm — CL (ref 3.6–11.0)

## 2013-02-02 LAB — BASIC METABOLIC PANEL
Anion Gap: 7 (ref 7–16)
Creatinine: 0.79 mg/dL (ref 0.60–1.30)
EGFR (African American): >60
EGFR (Non-African Amer.): >60
Sodium: 140 mmol/L (ref 136–145)

## 2013-02-08 ENCOUNTER — Ambulatory Visit: Payer: Self-pay | Admitting: Oncology

## 2013-02-08 DIAGNOSIS — M810 Age-related osteoporosis without current pathological fracture: Secondary | ICD-10-CM | POA: Diagnosis not present

## 2013-02-08 DIAGNOSIS — D702 Other drug-induced agranulocytosis: Secondary | ICD-10-CM | POA: Diagnosis not present

## 2013-02-08 DIAGNOSIS — Z87828 Personal history of other (healed) physical injury and trauma: Secondary | ICD-10-CM | POA: Diagnosis not present

## 2013-02-08 DIAGNOSIS — Z79899 Other long term (current) drug therapy: Secondary | ICD-10-CM | POA: Diagnosis not present

## 2013-02-08 DIAGNOSIS — Z96659 Presence of unspecified artificial knee joint: Secondary | ICD-10-CM | POA: Diagnosis not present

## 2013-02-08 DIAGNOSIS — C9000 Multiple myeloma not having achieved remission: Secondary | ICD-10-CM | POA: Diagnosis not present

## 2013-02-08 DIAGNOSIS — M5137 Other intervertebral disc degeneration, lumbosacral region: Secondary | ICD-10-CM | POA: Diagnosis not present

## 2013-02-08 DIAGNOSIS — M064 Inflammatory polyarthropathy: Secondary | ICD-10-CM | POA: Diagnosis not present

## 2013-02-08 DIAGNOSIS — Z66 Do not resuscitate: Secondary | ICD-10-CM | POA: Diagnosis not present

## 2013-02-08 DIAGNOSIS — R5381 Other malaise: Secondary | ICD-10-CM | POA: Diagnosis not present

## 2013-02-08 DIAGNOSIS — F411 Generalized anxiety disorder: Secondary | ICD-10-CM | POA: Diagnosis not present

## 2013-02-08 DIAGNOSIS — M25519 Pain in unspecified shoulder: Secondary | ICD-10-CM | POA: Diagnosis not present

## 2013-02-08 DIAGNOSIS — I4891 Unspecified atrial fibrillation: Secondary | ICD-10-CM | POA: Diagnosis not present

## 2013-02-08 DIAGNOSIS — E785 Hyperlipidemia, unspecified: Secondary | ICD-10-CM | POA: Diagnosis not present

## 2013-02-08 DIAGNOSIS — M8708 Idiopathic aseptic necrosis of bone, other site: Secondary | ICD-10-CM | POA: Diagnosis not present

## 2013-02-08 DIAGNOSIS — M5030 Other cervical disc degeneration, unspecified cervical region: Secondary | ICD-10-CM | POA: Diagnosis not present

## 2013-02-08 DIAGNOSIS — Z90710 Acquired absence of both cervix and uterus: Secondary | ICD-10-CM | POA: Diagnosis not present

## 2013-02-08 DIAGNOSIS — M545 Low back pain, unspecified: Secondary | ICD-10-CM | POA: Diagnosis not present

## 2013-02-11 DIAGNOSIS — G894 Chronic pain syndrome: Secondary | ICD-10-CM | POA: Diagnosis not present

## 2013-02-11 DIAGNOSIS — C9000 Multiple myeloma not having achieved remission: Secondary | ICD-10-CM | POA: Diagnosis not present

## 2013-02-11 DIAGNOSIS — I10 Essential (primary) hypertension: Secondary | ICD-10-CM | POA: Diagnosis not present

## 2013-02-11 DIAGNOSIS — R509 Fever, unspecified: Secondary | ICD-10-CM | POA: Diagnosis not present

## 2013-02-23 DIAGNOSIS — M25519 Pain in unspecified shoulder: Secondary | ICD-10-CM | POA: Diagnosis not present

## 2013-02-23 DIAGNOSIS — C9000 Multiple myeloma not having achieved remission: Secondary | ICD-10-CM | POA: Diagnosis not present

## 2013-02-23 DIAGNOSIS — M8708 Idiopathic aseptic necrosis of bone, other site: Secondary | ICD-10-CM | POA: Diagnosis not present

## 2013-02-23 DIAGNOSIS — D702 Other drug-induced agranulocytosis: Secondary | ICD-10-CM | POA: Diagnosis not present

## 2013-02-23 LAB — CBC CANCER CENTER
Basophil %: 1.3 %
Eosinophil #: 0.1 x10 3/mm (ref 0.0–0.7)
HGB: 11.6 g/dL — ABNORMAL LOW (ref 12.0–16.0)
Lymphocyte #: 1 x10 3/mm (ref 1.0–3.6)
MCH: 30.6 pg (ref 26.0–34.0)
MCHC: 32.9 g/dL (ref 32.0–36.0)
Neutrophil #: 2.3 x10 3/mm (ref 1.4–6.5)
Neutrophil %: 61.5 %
Platelet: 241 x10 3/mm (ref 150–440)
RBC: 3.78 10*6/uL — ABNORMAL LOW (ref 3.80–5.20)
RDW: 16.6 % — ABNORMAL HIGH (ref 11.5–14.5)

## 2013-02-24 LAB — KAPPA/LAMBDA FREE LIGHT CHAINS (ARMC)

## 2013-02-24 LAB — PROT IMMUNOELECTROPHORES(ARMC)

## 2013-03-10 ENCOUNTER — Ambulatory Visit: Payer: Self-pay | Admitting: Oncology

## 2013-03-15 ENCOUNTER — Emergency Department: Payer: Self-pay | Admitting: Emergency Medicine

## 2013-03-15 DIAGNOSIS — I10 Essential (primary) hypertension: Secondary | ICD-10-CM | POA: Diagnosis not present

## 2013-03-15 DIAGNOSIS — S61409A Unspecified open wound of unspecified hand, initial encounter: Secondary | ICD-10-CM | POA: Diagnosis not present

## 2013-03-15 DIAGNOSIS — Z79899 Other long term (current) drug therapy: Secondary | ICD-10-CM | POA: Diagnosis not present

## 2013-03-15 DIAGNOSIS — Z888 Allergy status to other drugs, medicaments and biological substances status: Secondary | ICD-10-CM | POA: Diagnosis not present

## 2013-03-16 DIAGNOSIS — H3581 Retinal edema: Secondary | ICD-10-CM | POA: Diagnosis not present

## 2013-03-30 DIAGNOSIS — I059 Rheumatic mitral valve disease, unspecified: Secondary | ICD-10-CM | POA: Diagnosis not present

## 2013-03-30 DIAGNOSIS — I4891 Unspecified atrial fibrillation: Secondary | ICD-10-CM | POA: Diagnosis not present

## 2013-03-30 DIAGNOSIS — E785 Hyperlipidemia, unspecified: Secondary | ICD-10-CM | POA: Diagnosis not present

## 2013-03-30 DIAGNOSIS — I10 Essential (primary) hypertension: Secondary | ICD-10-CM | POA: Diagnosis not present

## 2013-04-06 ENCOUNTER — Ambulatory Visit: Payer: Self-pay | Admitting: Oncology

## 2013-04-06 DIAGNOSIS — R0602 Shortness of breath: Secondary | ICD-10-CM | POA: Diagnosis not present

## 2013-04-06 DIAGNOSIS — Z66 Do not resuscitate: Secondary | ICD-10-CM | POA: Diagnosis not present

## 2013-04-06 DIAGNOSIS — M25519 Pain in unspecified shoulder: Secondary | ICD-10-CM | POA: Diagnosis not present

## 2013-04-06 DIAGNOSIS — Z90710 Acquired absence of both cervix and uterus: Secondary | ICD-10-CM | POA: Diagnosis not present

## 2013-04-06 DIAGNOSIS — IMO0001 Reserved for inherently not codable concepts without codable children: Secondary | ICD-10-CM | POA: Diagnosis not present

## 2013-04-06 DIAGNOSIS — Z79899 Other long term (current) drug therapy: Secondary | ICD-10-CM | POA: Diagnosis not present

## 2013-04-06 DIAGNOSIS — F411 Generalized anxiety disorder: Secondary | ICD-10-CM | POA: Diagnosis not present

## 2013-04-06 DIAGNOSIS — M8708 Idiopathic aseptic necrosis of bone, other site: Secondary | ICD-10-CM | POA: Diagnosis not present

## 2013-04-06 DIAGNOSIS — I4891 Unspecified atrial fibrillation: Secondary | ICD-10-CM | POA: Diagnosis not present

## 2013-04-06 DIAGNOSIS — M549 Dorsalgia, unspecified: Secondary | ICD-10-CM | POA: Diagnosis not present

## 2013-04-06 DIAGNOSIS — Z452 Encounter for adjustment and management of vascular access device: Secondary | ICD-10-CM | POA: Diagnosis not present

## 2013-04-06 DIAGNOSIS — C9000 Multiple myeloma not having achieved remission: Secondary | ICD-10-CM | POA: Diagnosis not present

## 2013-04-06 DIAGNOSIS — F329 Major depressive disorder, single episode, unspecified: Secondary | ICD-10-CM | POA: Diagnosis not present

## 2013-04-06 DIAGNOSIS — Z23 Encounter for immunization: Secondary | ICD-10-CM | POA: Diagnosis not present

## 2013-04-06 DIAGNOSIS — I2510 Atherosclerotic heart disease of native coronary artery without angina pectoris: Secondary | ICD-10-CM | POA: Diagnosis not present

## 2013-04-06 DIAGNOSIS — Z9221 Personal history of antineoplastic chemotherapy: Secondary | ICD-10-CM | POA: Diagnosis not present

## 2013-04-06 DIAGNOSIS — R110 Nausea: Secondary | ICD-10-CM | POA: Diagnosis not present

## 2013-04-06 DIAGNOSIS — Z923 Personal history of irradiation: Secondary | ICD-10-CM | POA: Diagnosis not present

## 2013-04-06 DIAGNOSIS — D702 Other drug-induced agranulocytosis: Secondary | ICD-10-CM | POA: Diagnosis not present

## 2013-04-06 DIAGNOSIS — E785 Hyperlipidemia, unspecified: Secondary | ICD-10-CM | POA: Diagnosis not present

## 2013-04-06 DIAGNOSIS — I10 Essential (primary) hypertension: Secondary | ICD-10-CM | POA: Diagnosis not present

## 2013-04-06 DIAGNOSIS — R630 Anorexia: Secondary | ICD-10-CM | POA: Diagnosis not present

## 2013-04-06 LAB — COMPREHENSIVE METABOLIC PANEL
Albumin: 3.1 g/dL — ABNORMAL LOW (ref 3.4–5.0)
Alkaline Phosphatase: 120 U/L (ref 50–136)
BUN: 12 mg/dL (ref 7–18)
Calcium, Total: 8.2 mg/dL — ABNORMAL LOW (ref 8.5–10.1)
Chloride: 102 mmol/L (ref 98–107)
Creatinine: 0.76 mg/dL (ref 0.60–1.30)
Glucose: 92 mg/dL (ref 65–99)
Osmolality: 277 (ref 275–301)
SGPT (ALT): 18 U/L (ref 12–78)
Sodium: 139 mmol/L (ref 136–145)
Total Protein: 6.7 g/dL (ref 6.4–8.2)

## 2013-04-06 LAB — CBC CANCER CENTER
Basophil %: 3.8 %
Eosinophil #: 0.1 x10 3/mm (ref 0.0–0.7)
Eosinophil %: 2 %
HCT: 35 % (ref 35.0–47.0)
Lymphocyte #: 0.5 x10 3/mm — ABNORMAL LOW (ref 1.0–3.6)
Lymphocyte %: 11.9 %
MCHC: 33.5 g/dL (ref 32.0–36.0)
MCV: 92 fL (ref 80–100)
Neutrophil #: 2.6 x10 3/mm (ref 1.4–6.5)
Neutrophil %: 59.6 %
RBC: 3.8 10*6/uL (ref 3.80–5.20)
WBC: 4.3 x10 3/mm (ref 3.6–11.0)

## 2013-04-07 LAB — PROT IMMUNOELECTROPHORES(ARMC)

## 2013-04-07 LAB — KAPPA/LAMBDA FREE LIGHT CHAINS (ARMC)

## 2013-04-10 ENCOUNTER — Ambulatory Visit: Payer: Self-pay | Admitting: Oncology

## 2013-04-10 DIAGNOSIS — M549 Dorsalgia, unspecified: Secondary | ICD-10-CM | POA: Diagnosis not present

## 2013-04-10 DIAGNOSIS — M810 Age-related osteoporosis without current pathological fracture: Secondary | ICD-10-CM | POA: Diagnosis not present

## 2013-04-10 DIAGNOSIS — I10 Essential (primary) hypertension: Secondary | ICD-10-CM | POA: Diagnosis not present

## 2013-04-10 DIAGNOSIS — M8708 Idiopathic aseptic necrosis of bone, other site: Secondary | ICD-10-CM | POA: Diagnosis not present

## 2013-04-10 DIAGNOSIS — Z9221 Personal history of antineoplastic chemotherapy: Secondary | ICD-10-CM | POA: Diagnosis not present

## 2013-04-10 DIAGNOSIS — Z510 Encounter for antineoplastic radiation therapy: Secondary | ICD-10-CM | POA: Diagnosis not present

## 2013-04-10 DIAGNOSIS — C9000 Multiple myeloma not having achieved remission: Secondary | ICD-10-CM | POA: Diagnosis not present

## 2013-04-10 DIAGNOSIS — Z452 Encounter for adjustment and management of vascular access device: Secondary | ICD-10-CM | POA: Diagnosis not present

## 2013-04-10 DIAGNOSIS — E785 Hyperlipidemia, unspecified: Secondary | ICD-10-CM | POA: Diagnosis not present

## 2013-04-10 DIAGNOSIS — G893 Neoplasm related pain (acute) (chronic): Secondary | ICD-10-CM | POA: Diagnosis not present

## 2013-04-10 DIAGNOSIS — Z923 Personal history of irradiation: Secondary | ICD-10-CM | POA: Diagnosis not present

## 2013-04-10 DIAGNOSIS — K5730 Diverticulosis of large intestine without perforation or abscess without bleeding: Secondary | ICD-10-CM | POA: Diagnosis not present

## 2013-04-10 DIAGNOSIS — M064 Inflammatory polyarthropathy: Secondary | ICD-10-CM | POA: Diagnosis not present

## 2013-04-10 DIAGNOSIS — M25519 Pain in unspecified shoulder: Secondary | ICD-10-CM | POA: Diagnosis not present

## 2013-04-10 DIAGNOSIS — Z79899 Other long term (current) drug therapy: Secondary | ICD-10-CM | POA: Diagnosis not present

## 2013-04-10 DIAGNOSIS — Z90710 Acquired absence of both cervix and uterus: Secondary | ICD-10-CM | POA: Diagnosis not present

## 2013-04-10 DIAGNOSIS — Z96659 Presence of unspecified artificial knee joint: Secondary | ICD-10-CM | POA: Diagnosis not present

## 2013-04-10 DIAGNOSIS — M545 Low back pain, unspecified: Secondary | ICD-10-CM | POA: Diagnosis not present

## 2013-04-10 DIAGNOSIS — M25559 Pain in unspecified hip: Secondary | ICD-10-CM | POA: Diagnosis not present

## 2013-04-10 DIAGNOSIS — I4891 Unspecified atrial fibrillation: Secondary | ICD-10-CM | POA: Diagnosis not present

## 2013-04-10 DIAGNOSIS — S46819A Strain of other muscles, fascia and tendons at shoulder and upper arm level, unspecified arm, initial encounter: Secondary | ICD-10-CM | POA: Diagnosis not present

## 2013-04-14 DIAGNOSIS — M25519 Pain in unspecified shoulder: Secondary | ICD-10-CM | POA: Diagnosis not present

## 2013-04-21 DIAGNOSIS — C9000 Multiple myeloma not having achieved remission: Secondary | ICD-10-CM | POA: Diagnosis not present

## 2013-04-21 DIAGNOSIS — G893 Neoplasm related pain (acute) (chronic): Secondary | ICD-10-CM | POA: Diagnosis not present

## 2013-04-21 DIAGNOSIS — S46819A Strain of other muscles, fascia and tendons at shoulder and upper arm level, unspecified arm, initial encounter: Secondary | ICD-10-CM | POA: Diagnosis not present

## 2013-04-21 DIAGNOSIS — M25519 Pain in unspecified shoulder: Secondary | ICD-10-CM | POA: Diagnosis not present

## 2013-04-27 DIAGNOSIS — C9000 Multiple myeloma not having achieved remission: Secondary | ICD-10-CM | POA: Diagnosis not present

## 2013-05-03 DIAGNOSIS — C9000 Multiple myeloma not having achieved remission: Secondary | ICD-10-CM | POA: Diagnosis not present

## 2013-05-04 DIAGNOSIS — S46819A Strain of other muscles, fascia and tendons at shoulder and upper arm level, unspecified arm, initial encounter: Secondary | ICD-10-CM | POA: Diagnosis not present

## 2013-05-04 DIAGNOSIS — M25519 Pain in unspecified shoulder: Secondary | ICD-10-CM | POA: Diagnosis not present

## 2013-05-04 DIAGNOSIS — G893 Neoplasm related pain (acute) (chronic): Secondary | ICD-10-CM | POA: Diagnosis not present

## 2013-05-04 DIAGNOSIS — C9000 Multiple myeloma not having achieved remission: Secondary | ICD-10-CM | POA: Diagnosis not present

## 2013-05-04 LAB — CBC CANCER CENTER
Basophil #: 0.1 x10 3/mm (ref 0.0–0.1)
Basophil %: 1.8 %
HGB: 11.7 g/dL — ABNORMAL LOW (ref 12.0–16.0)
Lymphocyte #: 0.5 x10 3/mm — ABNORMAL LOW (ref 1.0–3.6)
MCH: 29.6 pg (ref 26.0–34.0)
MCHC: 32.4 g/dL (ref 32.0–36.0)
MCV: 91 fL (ref 80–100)
Neutrophil #: 3.5 x10 3/mm (ref 1.4–6.5)
Platelet: 271 x10 3/mm (ref 150–440)
WBC: 5.1 x10 3/mm (ref 3.6–11.0)

## 2013-05-04 LAB — COMPREHENSIVE METABOLIC PANEL
Albumin: 2.9 g/dL — ABNORMAL LOW (ref 3.4–5.0)
Alkaline Phosphatase: 91 U/L
Co2: 29 mmol/L (ref 21–32)
EGFR (African American): >60
EGFR (Non-African Amer.): >60
Glucose: 89 mg/dL (ref 65–99)
Osmolality: 282 (ref 275–301)
Potassium: 4.6 mmol/L (ref 3.5–5.1)
SGPT (ALT): 17 U/L (ref 12–78)
Sodium: 141 mmol/L (ref 136–145)

## 2013-05-10 ENCOUNTER — Ambulatory Visit: Payer: Self-pay | Admitting: Oncology

## 2013-05-10 DIAGNOSIS — Z510 Encounter for antineoplastic radiation therapy: Secondary | ICD-10-CM | POA: Diagnosis not present

## 2013-05-10 DIAGNOSIS — C9000 Multiple myeloma not having achieved remission: Secondary | ICD-10-CM | POA: Diagnosis not present

## 2013-05-11 LAB — PROT IMMUNOELECTROPHORES(ARMC)

## 2013-05-12 LAB — CBC CANCER CENTER
Basophil #: 0.1 x10 3/mm (ref 0.0–0.1)
Basophil %: 4.5 %
Eosinophil #: 0.6 x10 3/mm (ref 0.0–0.7)
HCT: 36.7 % (ref 35.0–47.0)
Lymphocyte %: 22.8 %
MCH: 29.2 pg (ref 26.0–34.0)
MCV: 91 fL (ref 80–100)
Monocyte %: 28.1 %
Neutrophil #: 0.8 x10 3/mm — ABNORMAL LOW (ref 1.4–6.5)
Neutrophil %: 25.1 %
Platelet: 264 x10 3/mm (ref 150–440)
RBC: 4.02 10*6/uL (ref 3.80–5.20)
WBC: 3.2 x10 3/mm — ABNORMAL LOW (ref 3.6–11.0)

## 2013-05-18 DIAGNOSIS — C9000 Multiple myeloma not having achieved remission: Secondary | ICD-10-CM | POA: Diagnosis not present

## 2013-06-10 ENCOUNTER — Ambulatory Visit: Payer: Self-pay | Admitting: Oncology

## 2013-06-10 DIAGNOSIS — Z66 Do not resuscitate: Secondary | ICD-10-CM | POA: Diagnosis not present

## 2013-06-10 DIAGNOSIS — E785 Hyperlipidemia, unspecified: Secondary | ICD-10-CM | POA: Diagnosis not present

## 2013-06-10 DIAGNOSIS — Z90710 Acquired absence of both cervix and uterus: Secondary | ICD-10-CM | POA: Diagnosis not present

## 2013-06-10 DIAGNOSIS — M949 Disorder of cartilage, unspecified: Secondary | ICD-10-CM | POA: Diagnosis not present

## 2013-06-10 DIAGNOSIS — C9000 Multiple myeloma not having achieved remission: Secondary | ICD-10-CM | POA: Diagnosis not present

## 2013-06-10 DIAGNOSIS — R5381 Other malaise: Secondary | ICD-10-CM | POA: Diagnosis not present

## 2013-06-10 DIAGNOSIS — I2510 Atherosclerotic heart disease of native coronary artery without angina pectoris: Secondary | ICD-10-CM | POA: Diagnosis not present

## 2013-06-10 DIAGNOSIS — IMO0002 Reserved for concepts with insufficient information to code with codable children: Secondary | ICD-10-CM | POA: Diagnosis not present

## 2013-06-10 DIAGNOSIS — I10 Essential (primary) hypertension: Secondary | ICD-10-CM | POA: Diagnosis not present

## 2013-06-10 DIAGNOSIS — Z923 Personal history of irradiation: Secondary | ICD-10-CM | POA: Diagnosis not present

## 2013-06-10 DIAGNOSIS — Z96659 Presence of unspecified artificial knee joint: Secondary | ICD-10-CM | POA: Diagnosis not present

## 2013-06-10 DIAGNOSIS — M549 Dorsalgia, unspecified: Secondary | ICD-10-CM | POA: Diagnosis not present

## 2013-06-10 DIAGNOSIS — Z452 Encounter for adjustment and management of vascular access device: Secondary | ICD-10-CM | POA: Diagnosis not present

## 2013-06-10 DIAGNOSIS — Z09 Encounter for follow-up examination after completed treatment for conditions other than malignant neoplasm: Secondary | ICD-10-CM | POA: Diagnosis not present

## 2013-06-10 DIAGNOSIS — M064 Inflammatory polyarthropathy: Secondary | ICD-10-CM | POA: Diagnosis not present

## 2013-06-10 DIAGNOSIS — Z79899 Other long term (current) drug therapy: Secondary | ICD-10-CM | POA: Diagnosis not present

## 2013-06-10 DIAGNOSIS — M25519 Pain in unspecified shoulder: Secondary | ICD-10-CM | POA: Diagnosis not present

## 2013-06-10 DIAGNOSIS — Z9089 Acquired absence of other organs: Secondary | ICD-10-CM | POA: Diagnosis not present

## 2013-06-10 DIAGNOSIS — M899 Disorder of bone, unspecified: Secondary | ICD-10-CM | POA: Diagnosis not present

## 2013-06-10 DIAGNOSIS — K5730 Diverticulosis of large intestine without perforation or abscess without bleeding: Secondary | ICD-10-CM | POA: Diagnosis not present

## 2013-06-10 DIAGNOSIS — IMO0001 Reserved for inherently not codable concepts without codable children: Secondary | ICD-10-CM | POA: Diagnosis not present

## 2013-06-10 DIAGNOSIS — I4891 Unspecified atrial fibrillation: Secondary | ICD-10-CM | POA: Diagnosis not present

## 2013-06-10 DIAGNOSIS — M8708 Idiopathic aseptic necrosis of bone, other site: Secondary | ICD-10-CM | POA: Diagnosis not present

## 2013-06-10 DIAGNOSIS — M25559 Pain in unspecified hip: Secondary | ICD-10-CM | POA: Diagnosis not present

## 2013-06-10 DIAGNOSIS — M810 Age-related osteoporosis without current pathological fracture: Secondary | ICD-10-CM | POA: Diagnosis not present

## 2013-06-15 DIAGNOSIS — C9000 Multiple myeloma not having achieved remission: Secondary | ICD-10-CM | POA: Diagnosis not present

## 2013-06-15 DIAGNOSIS — M25519 Pain in unspecified shoulder: Secondary | ICD-10-CM | POA: Diagnosis not present

## 2013-06-15 DIAGNOSIS — R5383 Other fatigue: Secondary | ICD-10-CM | POA: Diagnosis not present

## 2013-06-15 DIAGNOSIS — R5381 Other malaise: Secondary | ICD-10-CM | POA: Diagnosis not present

## 2013-06-15 DIAGNOSIS — M8708 Idiopathic aseptic necrosis of bone, other site: Secondary | ICD-10-CM | POA: Diagnosis not present

## 2013-06-15 LAB — CBC CANCER CENTER
Basophil #: 0.1 x10 3/mm (ref 0.0–0.1)
Basophil %: 3.3 %
EOS ABS: 0.1 x10 3/mm (ref 0.0–0.7)
EOS PCT: 5 %
HCT: 35.6 % (ref 35.0–47.0)
HGB: 11.3 g/dL — ABNORMAL LOW (ref 12.0–16.0)
LYMPHS PCT: 25 %
Lymphocyte #: 0.6 x10 3/mm — ABNORMAL LOW (ref 1.0–3.6)
MCH: 29.4 pg (ref 26.0–34.0)
MCHC: 31.8 g/dL — ABNORMAL LOW (ref 32.0–36.0)
MCV: 93 fL (ref 80–100)
Monocyte #: 0.6 x10 3/mm (ref 0.2–0.9)
Monocyte %: 27.7 %
NEUTROS ABS: 0.9 x10 3/mm — AB (ref 1.4–6.5)
NEUTROS PCT: 39 %
PLATELETS: 259 x10 3/mm (ref 150–440)
RBC: 3.85 10*6/uL (ref 3.80–5.20)
RDW: 17.8 % — AB (ref 11.5–14.5)
WBC: 2.3 x10 3/mm — AB (ref 3.6–11.0)

## 2013-06-15 LAB — COMPREHENSIVE METABOLIC PANEL
ALBUMIN: 3.2 g/dL — AB (ref 3.4–5.0)
AST: 15 U/L (ref 15–37)
Alkaline Phosphatase: 72 U/L
Anion Gap: 8 (ref 7–16)
BUN: 15 mg/dL (ref 7–18)
Bilirubin,Total: 0.3 mg/dL (ref 0.2–1.0)
CO2: 30 mmol/L (ref 21–32)
Calcium, Total: 8.8 mg/dL (ref 8.5–10.1)
Chloride: 102 mmol/L (ref 98–107)
Creatinine: 0.78 mg/dL (ref 0.60–1.30)
EGFR (African American): >60
EGFR (Non-African Amer.): >60
Glucose: 76 mg/dL (ref 65–99)
OSMOLALITY: 279 (ref 275–301)
Potassium: 3.9 mmol/L (ref 3.5–5.1)
SGPT (ALT): 16 U/L (ref 12–78)
Sodium: 140 mmol/L (ref 136–145)
TOTAL PROTEIN: 6.1 g/dL — AB (ref 6.4–8.2)

## 2013-06-16 LAB — KAPPA/LAMBDA FREE LIGHT CHAINS (ARMC)

## 2013-06-16 LAB — PROT IMMUNOELECTROPHORES(ARMC)

## 2013-06-22 DIAGNOSIS — I2510 Atherosclerotic heart disease of native coronary artery without angina pectoris: Secondary | ICD-10-CM | POA: Diagnosis not present

## 2013-06-22 DIAGNOSIS — I059 Rheumatic mitral valve disease, unspecified: Secondary | ICD-10-CM | POA: Diagnosis not present

## 2013-06-22 DIAGNOSIS — E782 Mixed hyperlipidemia: Secondary | ICD-10-CM | POA: Diagnosis not present

## 2013-06-22 DIAGNOSIS — I4891 Unspecified atrial fibrillation: Secondary | ICD-10-CM | POA: Diagnosis not present

## 2013-07-06 DIAGNOSIS — R252 Cramp and spasm: Secondary | ICD-10-CM | POA: Diagnosis not present

## 2013-07-11 ENCOUNTER — Ambulatory Visit: Payer: Self-pay | Admitting: Oncology

## 2013-07-11 DIAGNOSIS — Z90710 Acquired absence of both cervix and uterus: Secondary | ICD-10-CM | POA: Diagnosis not present

## 2013-07-11 DIAGNOSIS — M064 Inflammatory polyarthropathy: Secondary | ICD-10-CM | POA: Diagnosis not present

## 2013-07-11 DIAGNOSIS — Z96659 Presence of unspecified artificial knee joint: Secondary | ICD-10-CM | POA: Diagnosis not present

## 2013-07-11 DIAGNOSIS — M8708 Idiopathic aseptic necrosis of bone, other site: Secondary | ICD-10-CM | POA: Diagnosis not present

## 2013-07-11 DIAGNOSIS — I10 Essential (primary) hypertension: Secondary | ICD-10-CM | POA: Diagnosis not present

## 2013-07-11 DIAGNOSIS — M810 Age-related osteoporosis without current pathological fracture: Secondary | ICD-10-CM | POA: Diagnosis not present

## 2013-07-11 DIAGNOSIS — K5730 Diverticulosis of large intestine without perforation or abscess without bleeding: Secondary | ICD-10-CM | POA: Diagnosis not present

## 2013-07-11 DIAGNOSIS — R5383 Other fatigue: Secondary | ICD-10-CM | POA: Diagnosis not present

## 2013-07-11 DIAGNOSIS — I4891 Unspecified atrial fibrillation: Secondary | ICD-10-CM | POA: Diagnosis not present

## 2013-07-11 DIAGNOSIS — Z79899 Other long term (current) drug therapy: Secondary | ICD-10-CM | POA: Diagnosis not present

## 2013-07-11 DIAGNOSIS — E785 Hyperlipidemia, unspecified: Secondary | ICD-10-CM | POA: Diagnosis not present

## 2013-07-11 DIAGNOSIS — R5381 Other malaise: Secondary | ICD-10-CM | POA: Diagnosis not present

## 2013-07-11 DIAGNOSIS — C9000 Multiple myeloma not having achieved remission: Secondary | ICD-10-CM | POA: Diagnosis not present

## 2013-07-11 DIAGNOSIS — M25519 Pain in unspecified shoulder: Secondary | ICD-10-CM | POA: Diagnosis not present

## 2013-07-11 DIAGNOSIS — Z923 Personal history of irradiation: Secondary | ICD-10-CM | POA: Diagnosis not present

## 2013-07-11 DIAGNOSIS — Z9221 Personal history of antineoplastic chemotherapy: Secondary | ICD-10-CM | POA: Diagnosis not present

## 2013-07-17 ENCOUNTER — Ambulatory Visit: Payer: Self-pay | Admitting: Family Medicine

## 2013-07-17 DIAGNOSIS — E785 Hyperlipidemia, unspecified: Secondary | ICD-10-CM | POA: Diagnosis not present

## 2013-07-17 DIAGNOSIS — J069 Acute upper respiratory infection, unspecified: Secondary | ICD-10-CM | POA: Diagnosis not present

## 2013-07-17 DIAGNOSIS — Z90710 Acquired absence of both cervix and uterus: Secondary | ICD-10-CM | POA: Diagnosis not present

## 2013-07-17 DIAGNOSIS — I10 Essential (primary) hypertension: Secondary | ICD-10-CM | POA: Diagnosis not present

## 2013-07-17 DIAGNOSIS — Z96659 Presence of unspecified artificial knee joint: Secondary | ICD-10-CM | POA: Diagnosis not present

## 2013-07-17 DIAGNOSIS — M129 Arthropathy, unspecified: Secondary | ICD-10-CM | POA: Diagnosis not present

## 2013-07-17 DIAGNOSIS — I4891 Unspecified atrial fibrillation: Secondary | ICD-10-CM | POA: Diagnosis not present

## 2013-07-17 DIAGNOSIS — C9000 Multiple myeloma not having achieved remission: Secondary | ICD-10-CM | POA: Diagnosis not present

## 2013-07-29 DIAGNOSIS — Z923 Personal history of irradiation: Secondary | ICD-10-CM | POA: Diagnosis not present

## 2013-07-29 DIAGNOSIS — M8708 Idiopathic aseptic necrosis of bone, other site: Secondary | ICD-10-CM | POA: Diagnosis not present

## 2013-07-29 DIAGNOSIS — M25519 Pain in unspecified shoulder: Secondary | ICD-10-CM | POA: Diagnosis not present

## 2013-07-29 DIAGNOSIS — C9000 Multiple myeloma not having achieved remission: Secondary | ICD-10-CM | POA: Diagnosis not present

## 2013-07-29 LAB — CBC CANCER CENTER
BASOS ABS: 0 x10 3/mm (ref 0.0–0.1)
Basophil %: 0.3 %
EOS PCT: 2.9 %
Eosinophil #: 0.1 x10 3/mm (ref 0.0–0.7)
HCT: 36.9 % (ref 35.0–47.0)
HGB: 11.9 g/dL — ABNORMAL LOW (ref 12.0–16.0)
LYMPHS ABS: 0.9 x10 3/mm — AB (ref 1.0–3.6)
Lymphocyte %: 19.6 %
MCH: 30.1 pg (ref 26.0–34.0)
MCHC: 32.3 g/dL (ref 32.0–36.0)
MCV: 93 fL (ref 80–100)
MONOS PCT: 17.3 %
Monocyte #: 0.8 x10 3/mm (ref 0.2–0.9)
NEUTROS ABS: 2.6 x10 3/mm (ref 1.4–6.5)
Neutrophil %: 59.9 %
Platelet: 227 x10 3/mm (ref 150–440)
RBC: 3.97 10*6/uL (ref 3.80–5.20)
RDW: 17.6 % — ABNORMAL HIGH (ref 11.5–14.5)
WBC: 4.3 x10 3/mm (ref 3.6–11.0)

## 2013-07-29 LAB — COMPREHENSIVE METABOLIC PANEL
ANION GAP: 8 (ref 7–16)
Albumin: 3.2 g/dL — ABNORMAL LOW (ref 3.4–5.0)
Alkaline Phosphatase: 86 U/L
BILIRUBIN TOTAL: 0.2 mg/dL (ref 0.2–1.0)
BUN: 13 mg/dL (ref 7–18)
CALCIUM: 8.2 mg/dL — AB (ref 8.5–10.1)
CHLORIDE: 104 mmol/L (ref 98–107)
Co2: 28 mmol/L (ref 21–32)
Creatinine: 0.77 mg/dL (ref 0.60–1.30)
EGFR (African American): >60
GLUCOSE: 80 mg/dL (ref 65–99)
Osmolality: 278 (ref 275–301)
Potassium: 4.3 mmol/L (ref 3.5–5.1)
SGOT(AST): 12 U/L — ABNORMAL LOW (ref 15–37)
SGPT (ALT): 17 U/L (ref 12–78)
SODIUM: 140 mmol/L (ref 136–145)
TOTAL PROTEIN: 6.5 g/dL (ref 6.4–8.2)

## 2013-08-08 ENCOUNTER — Ambulatory Visit: Payer: Self-pay | Admitting: Oncology

## 2013-08-08 DIAGNOSIS — M810 Age-related osteoporosis without current pathological fracture: Secondary | ICD-10-CM | POA: Diagnosis not present

## 2013-08-08 DIAGNOSIS — I4891 Unspecified atrial fibrillation: Secondary | ICD-10-CM | POA: Diagnosis not present

## 2013-08-08 DIAGNOSIS — E785 Hyperlipidemia, unspecified: Secondary | ICD-10-CM | POA: Diagnosis not present

## 2013-08-08 DIAGNOSIS — M25559 Pain in unspecified hip: Secondary | ICD-10-CM | POA: Diagnosis not present

## 2013-08-08 DIAGNOSIS — Z79899 Other long term (current) drug therapy: Secondary | ICD-10-CM | POA: Diagnosis not present

## 2013-08-08 DIAGNOSIS — M8708 Idiopathic aseptic necrosis of bone, other site: Secondary | ICD-10-CM | POA: Diagnosis not present

## 2013-08-08 DIAGNOSIS — M25519 Pain in unspecified shoulder: Secondary | ICD-10-CM | POA: Diagnosis not present

## 2013-08-08 DIAGNOSIS — I10 Essential (primary) hypertension: Secondary | ICD-10-CM | POA: Diagnosis not present

## 2013-08-08 DIAGNOSIS — Z96659 Presence of unspecified artificial knee joint: Secondary | ICD-10-CM | POA: Diagnosis not present

## 2013-08-08 DIAGNOSIS — M064 Inflammatory polyarthropathy: Secondary | ICD-10-CM | POA: Diagnosis not present

## 2013-08-08 DIAGNOSIS — C9000 Multiple myeloma not having achieved remission: Secondary | ICD-10-CM | POA: Diagnosis not present

## 2013-08-08 DIAGNOSIS — R5381 Other malaise: Secondary | ICD-10-CM | POA: Diagnosis not present

## 2013-08-08 DIAGNOSIS — Z90710 Acquired absence of both cervix and uterus: Secondary | ICD-10-CM | POA: Diagnosis not present

## 2013-08-08 DIAGNOSIS — M549 Dorsalgia, unspecified: Secondary | ICD-10-CM | POA: Diagnosis not present

## 2013-08-08 DIAGNOSIS — K5730 Diverticulosis of large intestine without perforation or abscess without bleeding: Secondary | ICD-10-CM | POA: Diagnosis not present

## 2013-08-08 DIAGNOSIS — R269 Unspecified abnormalities of gait and mobility: Secondary | ICD-10-CM | POA: Diagnosis not present

## 2013-08-08 DIAGNOSIS — R5383 Other fatigue: Secondary | ICD-10-CM | POA: Diagnosis not present

## 2013-08-08 DIAGNOSIS — Z9221 Personal history of antineoplastic chemotherapy: Secondary | ICD-10-CM | POA: Diagnosis not present

## 2013-08-08 DIAGNOSIS — Z66 Do not resuscitate: Secondary | ICD-10-CM | POA: Diagnosis not present

## 2013-08-09 DIAGNOSIS — Z0000 Encounter for general adult medical examination without abnormal findings: Secondary | ICD-10-CM | POA: Diagnosis not present

## 2013-08-19 DIAGNOSIS — H43819 Vitreous degeneration, unspecified eye: Secondary | ICD-10-CM | POA: Diagnosis not present

## 2013-08-26 DIAGNOSIS — M25519 Pain in unspecified shoulder: Secondary | ICD-10-CM | POA: Diagnosis not present

## 2013-08-26 DIAGNOSIS — R269 Unspecified abnormalities of gait and mobility: Secondary | ICD-10-CM | POA: Diagnosis not present

## 2013-08-26 DIAGNOSIS — C9000 Multiple myeloma not having achieved remission: Secondary | ICD-10-CM | POA: Diagnosis not present

## 2013-08-26 DIAGNOSIS — M8708 Idiopathic aseptic necrosis of bone, other site: Secondary | ICD-10-CM | POA: Diagnosis not present

## 2013-08-26 LAB — COMPREHENSIVE METABOLIC PANEL
ALBUMIN: 3.1 g/dL — AB (ref 3.4–5.0)
Alkaline Phosphatase: 81 U/L
Anion Gap: 10 (ref 7–16)
BUN: 18 mg/dL (ref 7–18)
Bilirubin,Total: 0.5 mg/dL (ref 0.2–1.0)
CALCIUM: 8.7 mg/dL (ref 8.5–10.1)
CHLORIDE: 103 mmol/L (ref 98–107)
CREATININE: 0.79 mg/dL (ref 0.60–1.30)
Co2: 26 mmol/L (ref 21–32)
EGFR (African American): >60
Glucose: 95 mg/dL (ref 65–99)
Osmolality: 279 (ref 275–301)
Potassium: 4.2 mmol/L (ref 3.5–5.1)
SGOT(AST): 14 U/L — ABNORMAL LOW (ref 15–37)
SGPT (ALT): 16 U/L (ref 12–78)
Sodium: 139 mmol/L (ref 136–145)
Total Protein: 6.2 g/dL — ABNORMAL LOW (ref 6.4–8.2)

## 2013-08-26 LAB — CBC CANCER CENTER
BASOS ABS: 0.2 x10 3/mm — AB (ref 0.0–0.1)
Basophil %: 4.3 %
Eosinophil #: 0.1 x10 3/mm (ref 0.0–0.7)
Eosinophil %: 2.5 %
HCT: 35.7 % (ref 35.0–47.0)
HGB: 11.5 g/dL — ABNORMAL LOW (ref 12.0–16.0)
LYMPHS ABS: 0.6 x10 3/mm — AB (ref 1.0–3.6)
Lymphocyte %: 14.8 %
MCH: 30.5 pg (ref 26.0–34.0)
MCHC: 32.3 g/dL (ref 32.0–36.0)
MCV: 94 fL (ref 80–100)
MONOS PCT: 22.1 %
Monocyte #: 0.9 x10 3/mm (ref 0.2–0.9)
NEUTROS PCT: 56.3 %
Neutrophil #: 2.2 x10 3/mm (ref 1.4–6.5)
Platelet: 174 x10 3/mm (ref 150–440)
RBC: 3.79 10*6/uL — ABNORMAL LOW (ref 3.80–5.20)
RDW: 17.7 % — AB (ref 11.5–14.5)
WBC: 3.9 x10 3/mm (ref 3.6–11.0)

## 2013-08-27 ENCOUNTER — Ambulatory Visit: Payer: Self-pay | Admitting: Family Medicine

## 2013-08-27 DIAGNOSIS — J069 Acute upper respiratory infection, unspecified: Secondary | ICD-10-CM | POA: Diagnosis not present

## 2013-08-27 DIAGNOSIS — Z96659 Presence of unspecified artificial knee joint: Secondary | ICD-10-CM | POA: Diagnosis not present

## 2013-08-27 DIAGNOSIS — I4891 Unspecified atrial fibrillation: Secondary | ICD-10-CM | POA: Diagnosis not present

## 2013-08-27 DIAGNOSIS — E785 Hyperlipidemia, unspecified: Secondary | ICD-10-CM | POA: Diagnosis not present

## 2013-08-27 DIAGNOSIS — J329 Chronic sinusitis, unspecified: Secondary | ICD-10-CM | POA: Diagnosis not present

## 2013-08-27 DIAGNOSIS — I10 Essential (primary) hypertension: Secondary | ICD-10-CM | POA: Diagnosis not present

## 2013-08-27 DIAGNOSIS — Z79899 Other long term (current) drug therapy: Secondary | ICD-10-CM | POA: Diagnosis not present

## 2013-08-27 DIAGNOSIS — Z90710 Acquired absence of both cervix and uterus: Secondary | ICD-10-CM | POA: Diagnosis not present

## 2013-08-30 LAB — PROT IMMUNOELECTROPHORES(ARMC)

## 2013-08-30 LAB — KAPPA/LAMBDA FREE LIGHT CHAINS (ARMC)

## 2013-09-08 ENCOUNTER — Ambulatory Visit: Payer: Self-pay | Admitting: Oncology

## 2013-09-09 ENCOUNTER — Ambulatory Visit: Payer: Self-pay | Admitting: Oncology

## 2013-09-09 DIAGNOSIS — C9000 Multiple myeloma not having achieved remission: Secondary | ICD-10-CM | POA: Diagnosis not present

## 2013-09-09 DIAGNOSIS — Z452 Encounter for adjustment and management of vascular access device: Secondary | ICD-10-CM | POA: Diagnosis not present

## 2013-10-01 ENCOUNTER — Ambulatory Visit: Payer: Self-pay | Admitting: Physician Assistant

## 2013-10-01 DIAGNOSIS — I4891 Unspecified atrial fibrillation: Secondary | ICD-10-CM | POA: Diagnosis not present

## 2013-10-01 DIAGNOSIS — Z96659 Presence of unspecified artificial knee joint: Secondary | ICD-10-CM | POA: Diagnosis not present

## 2013-10-01 DIAGNOSIS — S40029A Contusion of unspecified upper arm, initial encounter: Secondary | ICD-10-CM | POA: Diagnosis not present

## 2013-10-01 DIAGNOSIS — I10 Essential (primary) hypertension: Secondary | ICD-10-CM | POA: Diagnosis not present

## 2013-10-01 DIAGNOSIS — E785 Hyperlipidemia, unspecified: Secondary | ICD-10-CM | POA: Diagnosis not present

## 2013-10-01 DIAGNOSIS — Z79899 Other long term (current) drug therapy: Secondary | ICD-10-CM | POA: Diagnosis not present

## 2013-10-03 ENCOUNTER — Ambulatory Visit: Payer: Self-pay | Admitting: Physician Assistant

## 2013-10-03 DIAGNOSIS — Z96659 Presence of unspecified artificial knee joint: Secondary | ICD-10-CM | POA: Diagnosis not present

## 2013-10-03 DIAGNOSIS — Z4800 Encounter for change or removal of nonsurgical wound dressing: Secondary | ICD-10-CM | POA: Diagnosis not present

## 2013-10-03 DIAGNOSIS — I10 Essential (primary) hypertension: Secondary | ICD-10-CM | POA: Diagnosis not present

## 2013-10-03 DIAGNOSIS — I4891 Unspecified atrial fibrillation: Secondary | ICD-10-CM | POA: Diagnosis not present

## 2013-10-03 DIAGNOSIS — E785 Hyperlipidemia, unspecified: Secondary | ICD-10-CM | POA: Diagnosis not present

## 2013-10-07 ENCOUNTER — Ambulatory Visit: Payer: Self-pay

## 2013-10-07 ENCOUNTER — Ambulatory Visit: Payer: Self-pay | Admitting: Emergency Medicine

## 2013-10-07 DIAGNOSIS — Z4800 Encounter for change or removal of nonsurgical wound dressing: Secondary | ICD-10-CM | POA: Diagnosis not present

## 2013-10-07 DIAGNOSIS — I10 Essential (primary) hypertension: Secondary | ICD-10-CM | POA: Diagnosis not present

## 2013-10-07 DIAGNOSIS — I4891 Unspecified atrial fibrillation: Secondary | ICD-10-CM | POA: Diagnosis not present

## 2013-10-07 DIAGNOSIS — E785 Hyperlipidemia, unspecified: Secondary | ICD-10-CM | POA: Diagnosis not present

## 2013-10-07 DIAGNOSIS — Z90710 Acquired absence of both cervix and uterus: Secondary | ICD-10-CM | POA: Diagnosis not present

## 2013-10-08 ENCOUNTER — Ambulatory Visit: Payer: Self-pay | Admitting: Oncology

## 2013-10-08 DIAGNOSIS — Z9221 Personal history of antineoplastic chemotherapy: Secondary | ICD-10-CM | POA: Diagnosis not present

## 2013-10-08 DIAGNOSIS — I4891 Unspecified atrial fibrillation: Secondary | ICD-10-CM | POA: Diagnosis not present

## 2013-10-08 DIAGNOSIS — C9000 Multiple myeloma not having achieved remission: Secondary | ICD-10-CM | POA: Diagnosis not present

## 2013-10-08 DIAGNOSIS — I10 Essential (primary) hypertension: Secondary | ICD-10-CM | POA: Diagnosis not present

## 2013-10-08 DIAGNOSIS — Z923 Personal history of irradiation: Secondary | ICD-10-CM | POA: Diagnosis not present

## 2013-10-08 DIAGNOSIS — Z833 Family history of diabetes mellitus: Secondary | ICD-10-CM | POA: Diagnosis not present

## 2013-10-08 DIAGNOSIS — M129 Arthropathy, unspecified: Secondary | ICD-10-CM | POA: Diagnosis not present

## 2013-10-08 DIAGNOSIS — M25519 Pain in unspecified shoulder: Secondary | ICD-10-CM | POA: Diagnosis not present

## 2013-10-08 DIAGNOSIS — H409 Unspecified glaucoma: Secondary | ICD-10-CM | POA: Diagnosis not present

## 2013-10-08 DIAGNOSIS — Z8249 Family history of ischemic heart disease and other diseases of the circulatory system: Secondary | ICD-10-CM | POA: Diagnosis not present

## 2013-10-08 DIAGNOSIS — M549 Dorsalgia, unspecified: Secondary | ICD-10-CM | POA: Diagnosis not present

## 2013-10-08 DIAGNOSIS — K5730 Diverticulosis of large intestine without perforation or abscess without bleeding: Secondary | ICD-10-CM | POA: Diagnosis not present

## 2013-10-08 DIAGNOSIS — Z8509 Personal history of malignant neoplasm of other digestive organs: Secondary | ICD-10-CM | POA: Diagnosis not present

## 2013-10-08 DIAGNOSIS — Z66 Do not resuscitate: Secondary | ICD-10-CM | POA: Diagnosis not present

## 2013-10-08 DIAGNOSIS — Z96659 Presence of unspecified artificial knee joint: Secondary | ICD-10-CM | POA: Diagnosis not present

## 2013-10-08 DIAGNOSIS — M1990 Unspecified osteoarthritis, unspecified site: Secondary | ICD-10-CM | POA: Diagnosis not present

## 2013-10-08 DIAGNOSIS — M810 Age-related osteoporosis without current pathological fracture: Secondary | ICD-10-CM | POA: Diagnosis not present

## 2013-10-08 DIAGNOSIS — M8708 Idiopathic aseptic necrosis of bone, other site: Secondary | ICD-10-CM | POA: Diagnosis not present

## 2013-10-08 DIAGNOSIS — F411 Generalized anxiety disorder: Secondary | ICD-10-CM | POA: Diagnosis not present

## 2013-10-08 DIAGNOSIS — E785 Hyperlipidemia, unspecified: Secondary | ICD-10-CM | POA: Diagnosis not present

## 2013-10-08 DIAGNOSIS — Z79899 Other long term (current) drug therapy: Secondary | ICD-10-CM | POA: Diagnosis not present

## 2013-10-08 DIAGNOSIS — R5383 Other fatigue: Secondary | ICD-10-CM | POA: Diagnosis not present

## 2013-10-08 DIAGNOSIS — Z9089 Acquired absence of other organs: Secondary | ICD-10-CM | POA: Diagnosis not present

## 2013-10-08 DIAGNOSIS — R5381 Other malaise: Secondary | ICD-10-CM | POA: Diagnosis not present

## 2013-10-08 DIAGNOSIS — M25559 Pain in unspecified hip: Secondary | ICD-10-CM | POA: Diagnosis not present

## 2013-10-08 DIAGNOSIS — Z90710 Acquired absence of both cervix and uterus: Secondary | ICD-10-CM | POA: Diagnosis not present

## 2013-10-12 DIAGNOSIS — I059 Rheumatic mitral valve disease, unspecified: Secondary | ICD-10-CM | POA: Diagnosis not present

## 2013-10-12 DIAGNOSIS — I4891 Unspecified atrial fibrillation: Secondary | ICD-10-CM | POA: Diagnosis not present

## 2013-10-12 DIAGNOSIS — I10 Essential (primary) hypertension: Secondary | ICD-10-CM | POA: Diagnosis not present

## 2013-10-12 DIAGNOSIS — E785 Hyperlipidemia, unspecified: Secondary | ICD-10-CM | POA: Diagnosis not present

## 2013-10-14 DIAGNOSIS — C9000 Multiple myeloma not having achieved remission: Secondary | ICD-10-CM | POA: Diagnosis not present

## 2013-10-14 LAB — COMPREHENSIVE METABOLIC PANEL
ALBUMIN: 3.5 g/dL (ref 3.4–5.0)
ALK PHOS: 83 U/L
Anion Gap: 8 (ref 7–16)
BUN: 16 mg/dL (ref 7–18)
Bilirubin,Total: 0.6 mg/dL (ref 0.2–1.0)
CALCIUM: 8.7 mg/dL (ref 8.5–10.1)
CHLORIDE: 103 mmol/L (ref 98–107)
Co2: 30 mmol/L (ref 21–32)
Creatinine: 0.75 mg/dL (ref 0.60–1.30)
GLUCOSE: 84 mg/dL (ref 65–99)
Osmolality: 282 (ref 275–301)
POTASSIUM: 4 mmol/L (ref 3.5–5.1)
SGOT(AST): 11 U/L — ABNORMAL LOW (ref 15–37)
SGPT (ALT): 18 U/L (ref 12–78)
SODIUM: 141 mmol/L (ref 136–145)
Total Protein: 6.7 g/dL (ref 6.4–8.2)

## 2013-10-14 LAB — CBC CANCER CENTER
BASOS ABS: 0.1 x10 3/mm (ref 0.0–0.1)
Basophil %: 0.9 %
EOS PCT: 1 %
Eosinophil #: 0.1 x10 3/mm (ref 0.0–0.7)
HCT: 38.1 % (ref 35.0–47.0)
HGB: 12.6 g/dL (ref 12.0–16.0)
LYMPHS PCT: 11.1 %
Lymphocyte #: 0.7 x10 3/mm — ABNORMAL LOW (ref 1.0–3.6)
MCH: 32 pg (ref 26.0–34.0)
MCHC: 33.2 g/dL (ref 32.0–36.0)
MCV: 96 fL (ref 80–100)
MONOS PCT: 5 %
Monocyte #: 0.3 x10 3/mm (ref 0.2–0.9)
NEUTROS ABS: 5.1 x10 3/mm (ref 1.4–6.5)
Neutrophil %: 82 %
PLATELETS: 288 x10 3/mm (ref 150–440)
RBC: 3.95 10*6/uL (ref 3.80–5.20)
RDW: 17.1 % — ABNORMAL HIGH (ref 11.5–14.5)
WBC: 6.2 x10 3/mm (ref 3.6–11.0)

## 2013-10-18 ENCOUNTER — Ambulatory Visit: Payer: Self-pay | Admitting: Physician Assistant

## 2013-10-18 DIAGNOSIS — M7989 Other specified soft tissue disorders: Secondary | ICD-10-CM | POA: Diagnosis not present

## 2013-10-18 DIAGNOSIS — I4891 Unspecified atrial fibrillation: Secondary | ICD-10-CM | POA: Diagnosis not present

## 2013-10-18 DIAGNOSIS — L02419 Cutaneous abscess of limb, unspecified: Secondary | ICD-10-CM | POA: Diagnosis not present

## 2013-10-18 DIAGNOSIS — Z79899 Other long term (current) drug therapy: Secondary | ICD-10-CM | POA: Diagnosis not present

## 2013-10-18 DIAGNOSIS — Z96659 Presence of unspecified artificial knee joint: Secondary | ICD-10-CM | POA: Diagnosis not present

## 2013-10-18 DIAGNOSIS — I10 Essential (primary) hypertension: Secondary | ICD-10-CM | POA: Diagnosis not present

## 2013-10-18 DIAGNOSIS — E785 Hyperlipidemia, unspecified: Secondary | ICD-10-CM | POA: Diagnosis not present

## 2013-10-18 DIAGNOSIS — S8990XA Unspecified injury of unspecified lower leg, initial encounter: Secondary | ICD-10-CM | POA: Diagnosis not present

## 2013-10-18 DIAGNOSIS — S99919A Unspecified injury of unspecified ankle, initial encounter: Secondary | ICD-10-CM | POA: Diagnosis not present

## 2013-10-18 LAB — CBC WITH DIFFERENTIAL/PLATELET
BASOS ABS: 0.2 10*3/uL — AB (ref 0.0–0.1)
Basophil %: 4.9 %
EOS PCT: 2.9 %
Eosinophil #: 0.1 10*3/uL (ref 0.0–0.7)
HCT: 36.6 % (ref 35.0–47.0)
HGB: 12 g/dL (ref 12.0–16.0)
Lymphocyte #: 0.6 10*3/uL — ABNORMAL LOW (ref 1.0–3.6)
Lymphocyte %: 13.1 %
MCH: 32.1 pg (ref 26.0–34.0)
MCHC: 32.8 g/dL (ref 32.0–36.0)
MCV: 98 fL (ref 80–100)
MONO ABS: 0.8 x10 3/mm (ref 0.2–0.9)
Monocyte %: 18.4 %
Neutrophil #: 2.7 10*3/uL (ref 1.4–6.5)
Neutrophil %: 60.7 %
Platelet: 209 10*3/uL (ref 150–440)
RBC: 3.74 10*6/uL — ABNORMAL LOW (ref 3.80–5.20)
RDW: 16.8 % — ABNORMAL HIGH (ref 11.5–14.5)
WBC: 4.5 10*3/uL (ref 3.6–11.0)

## 2013-10-25 ENCOUNTER — Ambulatory Visit: Payer: Self-pay | Admitting: Physician Assistant

## 2013-10-25 DIAGNOSIS — M129 Arthropathy, unspecified: Secondary | ICD-10-CM | POA: Diagnosis not present

## 2013-10-25 DIAGNOSIS — R609 Edema, unspecified: Secondary | ICD-10-CM | POA: Diagnosis not present

## 2013-10-25 DIAGNOSIS — I4891 Unspecified atrial fibrillation: Secondary | ICD-10-CM | POA: Diagnosis not present

## 2013-10-25 DIAGNOSIS — Z96659 Presence of unspecified artificial knee joint: Secondary | ICD-10-CM | POA: Diagnosis not present

## 2013-10-25 DIAGNOSIS — Z90710 Acquired absence of both cervix and uterus: Secondary | ICD-10-CM | POA: Diagnosis not present

## 2013-10-25 DIAGNOSIS — I10 Essential (primary) hypertension: Secondary | ICD-10-CM | POA: Diagnosis not present

## 2013-10-25 DIAGNOSIS — E785 Hyperlipidemia, unspecified: Secondary | ICD-10-CM | POA: Diagnosis not present

## 2013-11-08 ENCOUNTER — Ambulatory Visit: Payer: Self-pay | Admitting: Oncology

## 2013-11-09 DIAGNOSIS — W19XXXA Unspecified fall, initial encounter: Secondary | ICD-10-CM | POA: Diagnosis not present

## 2013-11-09 DIAGNOSIS — M25519 Pain in unspecified shoulder: Secondary | ICD-10-CM | POA: Diagnosis not present

## 2013-11-09 DIAGNOSIS — Y92009 Unspecified place in unspecified non-institutional (private) residence as the place of occurrence of the external cause: Secondary | ICD-10-CM | POA: Diagnosis not present

## 2013-12-03 DIAGNOSIS — H1030 Unspecified acute conjunctivitis, unspecified eye: Secondary | ICD-10-CM | POA: Diagnosis not present

## 2013-12-09 ENCOUNTER — Ambulatory Visit: Payer: Self-pay | Admitting: Oncology

## 2013-12-09 DIAGNOSIS — R5383 Other fatigue: Secondary | ICD-10-CM | POA: Diagnosis not present

## 2013-12-09 DIAGNOSIS — M5137 Other intervertebral disc degeneration, lumbosacral region: Secondary | ICD-10-CM | POA: Diagnosis not present

## 2013-12-09 DIAGNOSIS — M5030 Other cervical disc degeneration, unspecified cervical region: Secondary | ICD-10-CM | POA: Diagnosis not present

## 2013-12-09 DIAGNOSIS — Z9089 Acquired absence of other organs: Secondary | ICD-10-CM | POA: Diagnosis not present

## 2013-12-09 DIAGNOSIS — R5381 Other malaise: Secondary | ICD-10-CM | POA: Diagnosis not present

## 2013-12-09 DIAGNOSIS — R269 Unspecified abnormalities of gait and mobility: Secondary | ICD-10-CM | POA: Diagnosis not present

## 2013-12-09 DIAGNOSIS — I4891 Unspecified atrial fibrillation: Secondary | ICD-10-CM | POA: Diagnosis not present

## 2013-12-09 DIAGNOSIS — E785 Hyperlipidemia, unspecified: Secondary | ICD-10-CM | POA: Diagnosis not present

## 2013-12-09 DIAGNOSIS — C9000 Multiple myeloma not having achieved remission: Secondary | ICD-10-CM | POA: Diagnosis not present

## 2013-12-09 DIAGNOSIS — Z90710 Acquired absence of both cervix and uterus: Secondary | ICD-10-CM | POA: Diagnosis not present

## 2013-12-09 DIAGNOSIS — I10 Essential (primary) hypertension: Secondary | ICD-10-CM | POA: Diagnosis not present

## 2013-12-09 DIAGNOSIS — Z923 Personal history of irradiation: Secondary | ICD-10-CM | POA: Diagnosis not present

## 2013-12-09 DIAGNOSIS — Z96659 Presence of unspecified artificial knee joint: Secondary | ICD-10-CM | POA: Diagnosis not present

## 2013-12-09 DIAGNOSIS — Z79899 Other long term (current) drug therapy: Secondary | ICD-10-CM | POA: Diagnosis not present

## 2013-12-09 DIAGNOSIS — Z66 Do not resuscitate: Secondary | ICD-10-CM | POA: Diagnosis not present

## 2013-12-09 DIAGNOSIS — M064 Inflammatory polyarthropathy: Secondary | ICD-10-CM | POA: Diagnosis not present

## 2013-12-09 DIAGNOSIS — M1990 Unspecified osteoarthritis, unspecified site: Secondary | ICD-10-CM | POA: Diagnosis not present

## 2013-12-09 DIAGNOSIS — M8708 Idiopathic aseptic necrosis of bone, other site: Secondary | ICD-10-CM | POA: Diagnosis not present

## 2013-12-09 LAB — CBC CANCER CENTER
BASOS PCT: 0.1 %
Basophil #: 0 x10 3/mm (ref 0.0–0.1)
EOS ABS: 0 x10 3/mm (ref 0.0–0.7)
Eosinophil %: 0.1 %
HCT: 37.7 % (ref 35.0–47.0)
HGB: 12.6 g/dL (ref 12.0–16.0)
Lymphocyte #: 0.5 x10 3/mm — ABNORMAL LOW (ref 1.0–3.6)
Lymphocyte %: 8.6 %
MCH: 32 pg (ref 26.0–34.0)
MCHC: 33.3 g/dL (ref 32.0–36.0)
MCV: 96 fL (ref 80–100)
MONOS PCT: 7.1 %
Monocyte #: 0.4 x10 3/mm (ref 0.2–0.9)
NEUTROS PCT: 84.1 %
Neutrophil #: 4.4 x10 3/mm (ref 1.4–6.5)
Platelet: 258 x10 3/mm (ref 150–440)
RBC: 3.92 10*6/uL (ref 3.80–5.20)
RDW: 16.6 % — ABNORMAL HIGH (ref 11.5–14.5)
WBC: 5.3 x10 3/mm (ref 3.6–11.0)

## 2013-12-09 LAB — COMPREHENSIVE METABOLIC PANEL
Albumin: 3.7 g/dL (ref 3.4–5.0)
Alkaline Phosphatase: 81 U/L
Anion Gap: 6 — ABNORMAL LOW (ref 7–16)
BILIRUBIN TOTAL: 0.4 mg/dL (ref 0.2–1.0)
BUN: 17 mg/dL (ref 7–18)
CALCIUM: 9.3 mg/dL (ref 8.5–10.1)
Chloride: 102 mmol/L (ref 98–107)
Co2: 32 mmol/L (ref 21–32)
Creatinine: 0.82 mg/dL (ref 0.60–1.30)
EGFR (Non-African Amer.): >60
Glucose: 93 mg/dL (ref 65–99)
Osmolality: 281 (ref 275–301)
POTASSIUM: 3.4 mmol/L — AB (ref 3.5–5.1)
SGOT(AST): 13 U/L — ABNORMAL LOW (ref 15–37)
SGPT (ALT): 19 U/L (ref 12–78)
Sodium: 140 mmol/L (ref 136–145)
TOTAL PROTEIN: 7.2 g/dL (ref 6.4–8.2)

## 2013-12-13 LAB — PROT IMMUNOELECTROPHORES(ARMC)

## 2013-12-13 LAB — KAPPA/LAMBDA FREE LIGHT CHAINS (ARMC)

## 2013-12-23 DIAGNOSIS — H16209 Unspecified keratoconjunctivitis, unspecified eye: Secondary | ICD-10-CM | POA: Diagnosis not present

## 2014-01-07 DIAGNOSIS — H01009 Unspecified blepharitis unspecified eye, unspecified eyelid: Secondary | ICD-10-CM | POA: Diagnosis not present

## 2014-01-08 ENCOUNTER — Ambulatory Visit: Payer: Self-pay | Admitting: Oncology

## 2014-01-08 DIAGNOSIS — Z79899 Other long term (current) drug therapy: Secondary | ICD-10-CM | POA: Diagnosis not present

## 2014-01-08 DIAGNOSIS — F411 Generalized anxiety disorder: Secondary | ICD-10-CM | POA: Diagnosis not present

## 2014-01-08 DIAGNOSIS — F329 Major depressive disorder, single episode, unspecified: Secondary | ICD-10-CM | POA: Diagnosis not present

## 2014-01-08 DIAGNOSIS — M5137 Other intervertebral disc degeneration, lumbosacral region: Secondary | ICD-10-CM | POA: Diagnosis not present

## 2014-01-08 DIAGNOSIS — R5381 Other malaise: Secondary | ICD-10-CM | POA: Diagnosis not present

## 2014-01-08 DIAGNOSIS — I10 Essential (primary) hypertension: Secondary | ICD-10-CM | POA: Diagnosis not present

## 2014-01-08 DIAGNOSIS — Z9181 History of falling: Secondary | ICD-10-CM | POA: Diagnosis not present

## 2014-01-08 DIAGNOSIS — Z923 Personal history of irradiation: Secondary | ICD-10-CM | POA: Diagnosis not present

## 2014-01-08 DIAGNOSIS — M064 Inflammatory polyarthropathy: Secondary | ICD-10-CM | POA: Diagnosis not present

## 2014-01-08 DIAGNOSIS — M1990 Unspecified osteoarthritis, unspecified site: Secondary | ICD-10-CM | POA: Diagnosis not present

## 2014-01-08 DIAGNOSIS — E785 Hyperlipidemia, unspecified: Secondary | ICD-10-CM | POA: Diagnosis not present

## 2014-01-08 DIAGNOSIS — M8708 Idiopathic aseptic necrosis of bone, other site: Secondary | ICD-10-CM | POA: Diagnosis not present

## 2014-01-08 DIAGNOSIS — I4891 Unspecified atrial fibrillation: Secondary | ICD-10-CM | POA: Diagnosis not present

## 2014-01-08 DIAGNOSIS — Z90710 Acquired absence of both cervix and uterus: Secondary | ICD-10-CM | POA: Diagnosis not present

## 2014-01-08 DIAGNOSIS — Z66 Do not resuscitate: Secondary | ICD-10-CM | POA: Diagnosis not present

## 2014-01-08 DIAGNOSIS — C9000 Multiple myeloma not having achieved remission: Secondary | ICD-10-CM | POA: Diagnosis not present

## 2014-01-08 DIAGNOSIS — F3289 Other specified depressive episodes: Secondary | ICD-10-CM | POA: Diagnosis not present

## 2014-01-08 DIAGNOSIS — M25519 Pain in unspecified shoulder: Secondary | ICD-10-CM | POA: Diagnosis not present

## 2014-01-08 DIAGNOSIS — R269 Unspecified abnormalities of gait and mobility: Secondary | ICD-10-CM | POA: Diagnosis not present

## 2014-01-08 DIAGNOSIS — M5030 Other cervical disc degeneration, unspecified cervical region: Secondary | ICD-10-CM | POA: Diagnosis not present

## 2014-01-08 DIAGNOSIS — Z96659 Presence of unspecified artificial knee joint: Secondary | ICD-10-CM | POA: Diagnosis not present

## 2014-01-08 DIAGNOSIS — Z9089 Acquired absence of other organs: Secondary | ICD-10-CM | POA: Diagnosis not present

## 2014-01-11 DIAGNOSIS — H01009 Unspecified blepharitis unspecified eye, unspecified eyelid: Secondary | ICD-10-CM | POA: Diagnosis not present

## 2014-01-20 DIAGNOSIS — R269 Unspecified abnormalities of gait and mobility: Secondary | ICD-10-CM | POA: Diagnosis not present

## 2014-01-20 DIAGNOSIS — M8708 Idiopathic aseptic necrosis of bone, other site: Secondary | ICD-10-CM | POA: Diagnosis not present

## 2014-01-20 DIAGNOSIS — R5381 Other malaise: Secondary | ICD-10-CM | POA: Diagnosis not present

## 2014-01-20 DIAGNOSIS — C9000 Multiple myeloma not having achieved remission: Secondary | ICD-10-CM | POA: Diagnosis not present

## 2014-01-20 LAB — CBC CANCER CENTER
Basophil #: 0.1 x10 3/mm (ref 0.0–0.1)
Basophil %: 1.9 %
EOS ABS: 0.1 x10 3/mm (ref 0.0–0.7)
Eosinophil %: 4.3 %
HCT: 37.7 % (ref 35.0–47.0)
HGB: 12.3 g/dL (ref 12.0–16.0)
LYMPHS ABS: 0.7 x10 3/mm — AB (ref 1.0–3.6)
LYMPHS PCT: 24.3 %
MCH: 31.8 pg (ref 26.0–34.0)
MCHC: 32.7 g/dL (ref 32.0–36.0)
MCV: 98 fL (ref 80–100)
MONO ABS: 1.1 x10 3/mm — AB (ref 0.2–0.9)
MONOS PCT: 38.6 %
NEUTROS ABS: 0.9 x10 3/mm — AB (ref 1.4–6.5)
Neutrophil %: 30.9 %
Platelet: 209 x10 3/mm (ref 150–440)
RBC: 3.87 10*6/uL (ref 3.80–5.20)
RDW: 17.4 % — ABNORMAL HIGH (ref 11.5–14.5)
WBC: 2.9 x10 3/mm — ABNORMAL LOW (ref 3.6–11.0)

## 2014-01-20 LAB — COMPREHENSIVE METABOLIC PANEL
ALK PHOS: 83 U/L
ALT: 17 U/L
ANION GAP: 8 (ref 7–16)
AST: 10 U/L — AB (ref 15–37)
Albumin: 3 g/dL — ABNORMAL LOW (ref 3.4–5.0)
BILIRUBIN TOTAL: 0.5 mg/dL (ref 0.2–1.0)
BUN: 14 mg/dL (ref 7–18)
CHLORIDE: 105 mmol/L (ref 98–107)
CREATININE: 0.8 mg/dL (ref 0.60–1.30)
Calcium, Total: 7.9 mg/dL — ABNORMAL LOW (ref 8.5–10.1)
Co2: 27 mmol/L (ref 21–32)
EGFR (African American): >60
EGFR (Non-African Amer.): >60
Glucose: 97 mg/dL (ref 65–99)
Osmolality: 280 (ref 275–301)
Potassium: 4 mmol/L (ref 3.5–5.1)
Sodium: 140 mmol/L (ref 136–145)
Total Protein: 6 g/dL — ABNORMAL LOW (ref 6.4–8.2)

## 2014-01-25 LAB — KAPPA/LAMBDA FREE LIGHT CHAINS (ARMC)

## 2014-01-25 LAB — PROT IMMUNOELECTROPHORES(ARMC)

## 2014-02-08 ENCOUNTER — Ambulatory Visit: Payer: Self-pay | Admitting: Oncology

## 2014-02-08 DIAGNOSIS — Z923 Personal history of irradiation: Secondary | ICD-10-CM | POA: Diagnosis not present

## 2014-02-08 DIAGNOSIS — R071 Chest pain on breathing: Secondary | ICD-10-CM | POA: Diagnosis not present

## 2014-02-08 DIAGNOSIS — E785 Hyperlipidemia, unspecified: Secondary | ICD-10-CM | POA: Diagnosis not present

## 2014-02-08 DIAGNOSIS — R5381 Other malaise: Secondary | ICD-10-CM | POA: Diagnosis not present

## 2014-02-08 DIAGNOSIS — I4891 Unspecified atrial fibrillation: Secondary | ICD-10-CM | POA: Diagnosis not present

## 2014-02-08 DIAGNOSIS — M129 Arthropathy, unspecified: Secondary | ICD-10-CM | POA: Diagnosis not present

## 2014-02-08 DIAGNOSIS — IMO0002 Reserved for concepts with insufficient information to code with codable children: Secondary | ICD-10-CM | POA: Diagnosis not present

## 2014-02-08 DIAGNOSIS — M545 Low back pain, unspecified: Secondary | ICD-10-CM | POA: Diagnosis not present

## 2014-02-08 DIAGNOSIS — C9000 Multiple myeloma not having achieved remission: Secondary | ICD-10-CM | POA: Diagnosis not present

## 2014-02-08 DIAGNOSIS — Z79899 Other long term (current) drug therapy: Secondary | ICD-10-CM | POA: Diagnosis not present

## 2014-02-08 DIAGNOSIS — Z90710 Acquired absence of both cervix and uterus: Secondary | ICD-10-CM | POA: Diagnosis not present

## 2014-02-08 DIAGNOSIS — Z9221 Personal history of antineoplastic chemotherapy: Secondary | ICD-10-CM | POA: Diagnosis not present

## 2014-02-08 DIAGNOSIS — M810 Age-related osteoporosis without current pathological fracture: Secondary | ICD-10-CM | POA: Diagnosis not present

## 2014-02-08 DIAGNOSIS — F411 Generalized anxiety disorder: Secondary | ICD-10-CM | POA: Diagnosis not present

## 2014-02-08 DIAGNOSIS — I10 Essential (primary) hypertension: Secondary | ICD-10-CM | POA: Diagnosis not present

## 2014-02-08 DIAGNOSIS — I2510 Atherosclerotic heart disease of native coronary artery without angina pectoris: Secondary | ICD-10-CM | POA: Diagnosis not present

## 2014-02-08 DIAGNOSIS — F329 Major depressive disorder, single episode, unspecified: Secondary | ICD-10-CM | POA: Diagnosis not present

## 2014-02-08 DIAGNOSIS — R269 Unspecified abnormalities of gait and mobility: Secondary | ICD-10-CM | POA: Diagnosis not present

## 2014-02-08 DIAGNOSIS — M1990 Unspecified osteoarthritis, unspecified site: Secondary | ICD-10-CM | POA: Diagnosis not present

## 2014-02-08 DIAGNOSIS — Z87311 Personal history of (healed) other pathological fracture: Secondary | ICD-10-CM | POA: Diagnosis not present

## 2014-02-08 DIAGNOSIS — Z8049 Family history of malignant neoplasm of other genital organs: Secondary | ICD-10-CM | POA: Diagnosis not present

## 2014-02-08 DIAGNOSIS — F3289 Other specified depressive episodes: Secondary | ICD-10-CM | POA: Diagnosis not present

## 2014-02-08 DIAGNOSIS — M8708 Idiopathic aseptic necrosis of bone, other site: Secondary | ICD-10-CM | POA: Diagnosis not present

## 2014-02-08 DIAGNOSIS — M25559 Pain in unspecified hip: Secondary | ICD-10-CM | POA: Diagnosis not present

## 2014-02-21 DIAGNOSIS — M545 Low back pain, unspecified: Secondary | ICD-10-CM | POA: Diagnosis not present

## 2014-02-21 DIAGNOSIS — IMO0002 Reserved for concepts with insufficient information to code with codable children: Secondary | ICD-10-CM | POA: Diagnosis not present

## 2014-02-21 DIAGNOSIS — R071 Chest pain on breathing: Secondary | ICD-10-CM | POA: Diagnosis not present

## 2014-02-21 DIAGNOSIS — R079 Chest pain, unspecified: Secondary | ICD-10-CM | POA: Diagnosis not present

## 2014-02-21 DIAGNOSIS — C9000 Multiple myeloma not having achieved remission: Secondary | ICD-10-CM | POA: Diagnosis not present

## 2014-02-21 DIAGNOSIS — S298XXA Other specified injuries of thorax, initial encounter: Secondary | ICD-10-CM | POA: Diagnosis not present

## 2014-02-23 DIAGNOSIS — I10 Essential (primary) hypertension: Secondary | ICD-10-CM | POA: Diagnosis not present

## 2014-02-23 DIAGNOSIS — I4891 Unspecified atrial fibrillation: Secondary | ICD-10-CM | POA: Diagnosis not present

## 2014-02-23 DIAGNOSIS — I059 Rheumatic mitral valve disease, unspecified: Secondary | ICD-10-CM | POA: Diagnosis not present

## 2014-02-23 DIAGNOSIS — I2510 Atherosclerotic heart disease of native coronary artery without angina pectoris: Secondary | ICD-10-CM | POA: Diagnosis not present

## 2014-03-03 DIAGNOSIS — C9000 Multiple myeloma not having achieved remission: Secondary | ICD-10-CM | POA: Diagnosis not present

## 2014-03-03 DIAGNOSIS — R269 Unspecified abnormalities of gait and mobility: Secondary | ICD-10-CM | POA: Diagnosis not present

## 2014-03-03 DIAGNOSIS — M8708 Idiopathic aseptic necrosis of bone, other site: Secondary | ICD-10-CM | POA: Diagnosis not present

## 2014-03-04 LAB — KAPPA/LAMBDA FREE LIGHT CHAINS (ARMC)

## 2014-03-10 ENCOUNTER — Ambulatory Visit: Payer: Self-pay | Admitting: Oncology

## 2014-03-19 ENCOUNTER — Ambulatory Visit: Payer: Self-pay | Admitting: Family Medicine

## 2014-03-19 DIAGNOSIS — Z96652 Presence of left artificial knee joint: Secondary | ICD-10-CM | POA: Diagnosis not present

## 2014-03-19 DIAGNOSIS — Z90710 Acquired absence of both cervix and uterus: Secondary | ICD-10-CM | POA: Diagnosis not present

## 2014-03-19 DIAGNOSIS — X58XXXA Exposure to other specified factors, initial encounter: Secondary | ICD-10-CM | POA: Diagnosis not present

## 2014-03-19 DIAGNOSIS — C9000 Multiple myeloma not having achieved remission: Secondary | ICD-10-CM | POA: Diagnosis not present

## 2014-03-19 DIAGNOSIS — I10 Essential (primary) hypertension: Secondary | ICD-10-CM | POA: Diagnosis not present

## 2014-03-19 DIAGNOSIS — S161XXA Strain of muscle, fascia and tendon at neck level, initial encounter: Secondary | ICD-10-CM | POA: Diagnosis not present

## 2014-03-19 DIAGNOSIS — I4891 Unspecified atrial fibrillation: Secondary | ICD-10-CM | POA: Diagnosis not present

## 2014-03-19 DIAGNOSIS — M1990 Unspecified osteoarthritis, unspecified site: Secondary | ICD-10-CM | POA: Diagnosis not present

## 2014-04-06 ENCOUNTER — Ambulatory Visit: Payer: Self-pay

## 2014-04-06 DIAGNOSIS — Z96652 Presence of left artificial knee joint: Secondary | ICD-10-CM | POA: Diagnosis not present

## 2014-04-06 DIAGNOSIS — I10 Essential (primary) hypertension: Secondary | ICD-10-CM | POA: Diagnosis not present

## 2014-04-06 DIAGNOSIS — S161XXA Strain of muscle, fascia and tendon at neck level, initial encounter: Secondary | ICD-10-CM | POA: Diagnosis not present

## 2014-04-06 DIAGNOSIS — M5020 Other cervical disc displacement, unspecified cervical region: Secondary | ICD-10-CM | POA: Diagnosis not present

## 2014-04-14 ENCOUNTER — Ambulatory Visit: Payer: Self-pay | Admitting: Oncology

## 2014-04-14 DIAGNOSIS — R5383 Other fatigue: Secondary | ICD-10-CM | POA: Diagnosis not present

## 2014-04-14 DIAGNOSIS — Z9181 History of falling: Secondary | ICD-10-CM | POA: Diagnosis not present

## 2014-04-14 DIAGNOSIS — Z23 Encounter for immunization: Secondary | ICD-10-CM | POA: Diagnosis not present

## 2014-04-14 DIAGNOSIS — Z923 Personal history of irradiation: Secondary | ICD-10-CM | POA: Diagnosis not present

## 2014-04-14 DIAGNOSIS — D701 Agranulocytosis secondary to cancer chemotherapy: Secondary | ICD-10-CM | POA: Diagnosis not present

## 2014-04-14 DIAGNOSIS — Z79899 Other long term (current) drug therapy: Secondary | ICD-10-CM | POA: Diagnosis not present

## 2014-04-14 DIAGNOSIS — I4891 Unspecified atrial fibrillation: Secondary | ICD-10-CM | POA: Diagnosis not present

## 2014-04-14 DIAGNOSIS — R270 Ataxia, unspecified: Secondary | ICD-10-CM | POA: Diagnosis not present

## 2014-04-14 DIAGNOSIS — I10 Essential (primary) hypertension: Secondary | ICD-10-CM | POA: Diagnosis not present

## 2014-04-14 DIAGNOSIS — F419 Anxiety disorder, unspecified: Secondary | ICD-10-CM | POA: Diagnosis not present

## 2014-04-14 DIAGNOSIS — C9000 Multiple myeloma not having achieved remission: Secondary | ICD-10-CM | POA: Diagnosis not present

## 2014-04-14 DIAGNOSIS — M810 Age-related osteoporosis without current pathological fracture: Secondary | ICD-10-CM | POA: Diagnosis not present

## 2014-04-14 DIAGNOSIS — Z66 Do not resuscitate: Secondary | ICD-10-CM | POA: Diagnosis not present

## 2014-04-14 DIAGNOSIS — Z7722 Contact with and (suspected) exposure to environmental tobacco smoke (acute) (chronic): Secondary | ICD-10-CM | POA: Diagnosis not present

## 2014-04-14 DIAGNOSIS — K5790 Diverticulosis of intestine, part unspecified, without perforation or abscess without bleeding: Secondary | ICD-10-CM | POA: Diagnosis not present

## 2014-04-14 DIAGNOSIS — Z9221 Personal history of antineoplastic chemotherapy: Secondary | ICD-10-CM | POA: Diagnosis not present

## 2014-04-14 DIAGNOSIS — Z90710 Acquired absence of both cervix and uterus: Secondary | ICD-10-CM | POA: Diagnosis not present

## 2014-04-14 DIAGNOSIS — T451X5S Adverse effect of antineoplastic and immunosuppressive drugs, sequela: Secondary | ICD-10-CM | POA: Diagnosis not present

## 2014-04-14 DIAGNOSIS — M87180 Osteonecrosis due to drugs, jaw: Secondary | ICD-10-CM | POA: Diagnosis not present

## 2014-04-14 DIAGNOSIS — E785 Hyperlipidemia, unspecified: Secondary | ICD-10-CM | POA: Diagnosis not present

## 2014-04-14 DIAGNOSIS — R531 Weakness: Secondary | ICD-10-CM | POA: Diagnosis not present

## 2014-04-14 DIAGNOSIS — M1990 Unspecified osteoarthritis, unspecified site: Secondary | ICD-10-CM | POA: Diagnosis not present

## 2014-04-14 LAB — COMPREHENSIVE METABOLIC PANEL
ALBUMIN: 3.2 g/dL — AB (ref 3.4–5.0)
ALK PHOS: 82 U/L
Anion Gap: 7 (ref 7–16)
BILIRUBIN TOTAL: 0.4 mg/dL (ref 0.2–1.0)
BUN: 11 mg/dL (ref 7–18)
Calcium, Total: 8.2 mg/dL — ABNORMAL LOW (ref 8.5–10.1)
Chloride: 109 mmol/L — ABNORMAL HIGH (ref 98–107)
Co2: 27 mmol/L (ref 21–32)
Creatinine: 0.82 mg/dL (ref 0.60–1.30)
EGFR (African American): >60
EGFR (Non-African Amer.): >60
GLUCOSE: 81 mg/dL (ref 65–99)
OSMOLALITY: 283 (ref 275–301)
Potassium: 3.9 mmol/L (ref 3.5–5.1)
SGOT(AST): 20 U/L (ref 15–37)
SGPT (ALT): 15 U/L
Sodium: 143 mmol/L (ref 136–145)
Total Protein: 6.2 g/dL — ABNORMAL LOW (ref 6.4–8.2)

## 2014-04-14 LAB — CBC CANCER CENTER
BASOS ABS: 0.1 x10 3/mm (ref 0.0–0.1)
Basophil %: 3.6 %
Eosinophil #: 0.2 x10 3/mm (ref 0.0–0.7)
Eosinophil %: 8.5 %
HCT: 35.3 % (ref 35.0–47.0)
HGB: 11.5 g/dL — ABNORMAL LOW (ref 12.0–16.0)
LYMPHS ABS: 0.6 x10 3/mm — AB (ref 1.0–3.6)
Lymphocyte %: 28.6 %
MCH: 32 pg (ref 26.0–34.0)
MCHC: 32.7 g/dL (ref 32.0–36.0)
MCV: 98 fL (ref 80–100)
MONO ABS: 0.6 x10 3/mm (ref 0.2–0.9)
Monocyte %: 31.2 %
NEUTROS PCT: 28.1 %
Neutrophil #: 0.6 x10 3/mm — ABNORMAL LOW (ref 1.4–6.5)
Platelet: 214 x10 3/mm (ref 150–440)
RBC: 3.61 10*6/uL — ABNORMAL LOW (ref 3.80–5.20)
RDW: 16 % — ABNORMAL HIGH (ref 11.5–14.5)
WBC: 2.1 x10 3/mm — ABNORMAL LOW (ref 3.6–11.0)

## 2014-04-15 LAB — KAPPA/LAMBDA FREE LIGHT CHAINS (ARMC)

## 2014-04-15 LAB — PROT IMMUNOELECTROPHORES(ARMC)

## 2014-05-10 ENCOUNTER — Ambulatory Visit: Payer: Self-pay | Admitting: Oncology

## 2014-05-19 ENCOUNTER — Ambulatory Visit: Payer: Self-pay | Admitting: Oncology

## 2014-05-19 DIAGNOSIS — Z923 Personal history of irradiation: Secondary | ICD-10-CM | POA: Diagnosis not present

## 2014-05-19 DIAGNOSIS — R262 Difficulty in walking, not elsewhere classified: Secondary | ICD-10-CM | POA: Diagnosis not present

## 2014-05-19 DIAGNOSIS — M810 Age-related osteoporosis without current pathological fracture: Secondary | ICD-10-CM | POA: Diagnosis not present

## 2014-05-19 DIAGNOSIS — R5383 Other fatigue: Secondary | ICD-10-CM | POA: Diagnosis not present

## 2014-05-19 DIAGNOSIS — R531 Weakness: Secondary | ICD-10-CM | POA: Diagnosis not present

## 2014-05-19 DIAGNOSIS — Z79899 Other long term (current) drug therapy: Secondary | ICD-10-CM | POA: Diagnosis not present

## 2014-05-19 DIAGNOSIS — Z9181 History of falling: Secondary | ICD-10-CM | POA: Diagnosis not present

## 2014-05-19 DIAGNOSIS — K5790 Diverticulosis of intestine, part unspecified, without perforation or abscess without bleeding: Secondary | ICD-10-CM | POA: Diagnosis not present

## 2014-05-19 DIAGNOSIS — Z9221 Personal history of antineoplastic chemotherapy: Secondary | ICD-10-CM | POA: Diagnosis not present

## 2014-05-19 DIAGNOSIS — I2510 Atherosclerotic heart disease of native coronary artery without angina pectoris: Secondary | ICD-10-CM | POA: Diagnosis not present

## 2014-05-19 DIAGNOSIS — R200 Anesthesia of skin: Secondary | ICD-10-CM | POA: Diagnosis not present

## 2014-05-19 DIAGNOSIS — I10 Essential (primary) hypertension: Secondary | ICD-10-CM | POA: Diagnosis not present

## 2014-05-19 DIAGNOSIS — F419 Anxiety disorder, unspecified: Secondary | ICD-10-CM | POA: Diagnosis not present

## 2014-05-19 DIAGNOSIS — E785 Hyperlipidemia, unspecified: Secondary | ICD-10-CM | POA: Diagnosis not present

## 2014-05-19 DIAGNOSIS — T451X5S Adverse effect of antineoplastic and immunosuppressive drugs, sequela: Secondary | ICD-10-CM | POA: Diagnosis not present

## 2014-05-19 DIAGNOSIS — Z66 Do not resuscitate: Secondary | ICD-10-CM | POA: Diagnosis not present

## 2014-05-19 DIAGNOSIS — C9000 Multiple myeloma not having achieved remission: Secondary | ICD-10-CM | POA: Diagnosis not present

## 2014-05-19 DIAGNOSIS — I482 Chronic atrial fibrillation: Secondary | ICD-10-CM | POA: Diagnosis not present

## 2014-05-19 DIAGNOSIS — M1990 Unspecified osteoarthritis, unspecified site: Secondary | ICD-10-CM | POA: Diagnosis not present

## 2014-05-19 DIAGNOSIS — M87180 Osteonecrosis due to drugs, jaw: Secondary | ICD-10-CM | POA: Diagnosis not present

## 2014-05-19 LAB — COMPREHENSIVE METABOLIC PANEL
ALT: 20 U/L
AST: 12 U/L — AB (ref 15–37)
Albumin: 3.7 g/dL (ref 3.4–5.0)
Alkaline Phosphatase: 98 U/L
Anion Gap: 10 (ref 7–16)
BILIRUBIN TOTAL: 0.5 mg/dL (ref 0.2–1.0)
BUN: 12 mg/dL (ref 7–18)
Calcium, Total: 8.7 mg/dL (ref 8.5–10.1)
Chloride: 102 mmol/L (ref 98–107)
Co2: 29 mmol/L (ref 21–32)
Creatinine: 0.85 mg/dL (ref 0.60–1.30)
EGFR (African American): >60
EGFR (Non-African Amer.): >60
Glucose: 115 mg/dL — ABNORMAL HIGH (ref 65–99)
Osmolality: 282 (ref 275–301)
Potassium: 3.8 mmol/L (ref 3.5–5.1)
Sodium: 141 mmol/L (ref 136–145)
Total Protein: 7 g/dL (ref 6.4–8.2)

## 2014-05-19 LAB — CBC CANCER CENTER
BASOS ABS: 0 x10 3/mm (ref 0.0–0.1)
Basophil %: 0.8 %
Eosinophil #: 0 x10 3/mm (ref 0.0–0.7)
Eosinophil %: 1.9 %
HCT: 41.4 % (ref 35.0–47.0)
HGB: 13.5 g/dL (ref 12.0–16.0)
LYMPHS PCT: 26 %
Lymphocyte #: 0.4 x10 3/mm — ABNORMAL LOW (ref 1.0–3.6)
MCH: 31.3 pg (ref 26.0–34.0)
MCHC: 32.7 g/dL (ref 32.0–36.0)
MCV: 96 fL (ref 80–100)
MONOS PCT: 14.9 %
Monocyte #: 0.2 x10 3/mm (ref 0.2–0.9)
NEUTROS ABS: 0.9 x10 3/mm — AB (ref 1.4–6.5)
NEUTROS PCT: 56.4 %
Platelet: 221 x10 3/mm (ref 150–440)
RBC: 4.32 10*6/uL (ref 3.80–5.20)
RDW: 16.1 % — AB (ref 11.5–14.5)
WBC: 1.6 x10 3/mm — CL (ref 3.6–11.0)

## 2014-05-19 LAB — MAGNESIUM: Magnesium: 1.9 mg/dL

## 2014-05-20 LAB — KAPPA/LAMBDA FREE LIGHT CHAINS (ARMC)

## 2014-05-20 LAB — PROT IMMUNOELECTROPHORES(ARMC)

## 2014-05-25 DIAGNOSIS — I10 Essential (primary) hypertension: Secondary | ICD-10-CM | POA: Diagnosis not present

## 2014-05-25 DIAGNOSIS — I480 Paroxysmal atrial fibrillation: Secondary | ICD-10-CM | POA: Insufficient documentation

## 2014-05-25 DIAGNOSIS — I071 Rheumatic tricuspid insufficiency: Secondary | ICD-10-CM | POA: Diagnosis not present

## 2014-05-25 DIAGNOSIS — I2510 Atherosclerotic heart disease of native coronary artery without angina pectoris: Secondary | ICD-10-CM | POA: Diagnosis not present

## 2014-05-25 DIAGNOSIS — I340 Nonrheumatic mitral (valve) insufficiency: Secondary | ICD-10-CM | POA: Insufficient documentation

## 2014-06-06 DIAGNOSIS — I480 Paroxysmal atrial fibrillation: Secondary | ICD-10-CM | POA: Diagnosis not present

## 2014-06-08 ENCOUNTER — Ambulatory Visit: Payer: Self-pay | Admitting: Family Medicine

## 2014-06-08 DIAGNOSIS — I998 Other disorder of circulatory system: Secondary | ICD-10-CM | POA: Diagnosis not present

## 2014-06-08 DIAGNOSIS — Z85830 Personal history of malignant neoplasm of bone: Secondary | ICD-10-CM | POA: Diagnosis not present

## 2014-06-08 DIAGNOSIS — Z8579 Personal history of other malignant neoplasms of lymphoid, hematopoietic and related tissues: Secondary | ICD-10-CM | POA: Diagnosis not present

## 2014-06-08 DIAGNOSIS — R401 Stupor: Secondary | ICD-10-CM | POA: Diagnosis not present

## 2014-06-08 DIAGNOSIS — R51 Headache: Secondary | ICD-10-CM | POA: Diagnosis not present

## 2014-06-08 DIAGNOSIS — S0990XA Unspecified injury of head, initial encounter: Secondary | ICD-10-CM | POA: Diagnosis not present

## 2014-06-08 DIAGNOSIS — W19XXXA Unspecified fall, initial encounter: Secondary | ICD-10-CM | POA: Diagnosis not present

## 2014-06-10 ENCOUNTER — Ambulatory Visit: Payer: Self-pay | Admitting: Oncology

## 2014-06-10 DIAGNOSIS — M549 Dorsalgia, unspecified: Secondary | ICD-10-CM | POA: Diagnosis not present

## 2014-06-10 DIAGNOSIS — C9000 Multiple myeloma not having achieved remission: Secondary | ICD-10-CM | POA: Diagnosis not present

## 2014-06-10 DIAGNOSIS — D759 Disease of blood and blood-forming organs, unspecified: Secondary | ICD-10-CM | POA: Diagnosis not present

## 2014-06-10 DIAGNOSIS — I4891 Unspecified atrial fibrillation: Secondary | ICD-10-CM | POA: Diagnosis not present

## 2014-06-10 DIAGNOSIS — E785 Hyperlipidemia, unspecified: Secondary | ICD-10-CM | POA: Diagnosis not present

## 2014-06-10 DIAGNOSIS — Z9221 Personal history of antineoplastic chemotherapy: Secondary | ICD-10-CM | POA: Diagnosis not present

## 2014-06-10 DIAGNOSIS — M1990 Unspecified osteoarthritis, unspecified site: Secondary | ICD-10-CM | POA: Diagnosis not present

## 2014-06-10 DIAGNOSIS — Z90710 Acquired absence of both cervix and uterus: Secondary | ICD-10-CM | POA: Diagnosis not present

## 2014-06-10 DIAGNOSIS — R0789 Other chest pain: Secondary | ICD-10-CM | POA: Diagnosis not present

## 2014-06-10 DIAGNOSIS — Z923 Personal history of irradiation: Secondary | ICD-10-CM | POA: Diagnosis not present

## 2014-06-10 DIAGNOSIS — I10 Essential (primary) hypertension: Secondary | ICD-10-CM | POA: Diagnosis not present

## 2014-06-10 DIAGNOSIS — Z7722 Contact with and (suspected) exposure to environmental tobacco smoke (acute) (chronic): Secondary | ICD-10-CM | POA: Diagnosis not present

## 2014-06-10 DIAGNOSIS — Z96652 Presence of left artificial knee joint: Secondary | ICD-10-CM | POA: Diagnosis not present

## 2014-06-10 DIAGNOSIS — R531 Weakness: Secondary | ICD-10-CM | POA: Diagnosis not present

## 2014-06-10 DIAGNOSIS — Z66 Do not resuscitate: Secondary | ICD-10-CM | POA: Diagnosis not present

## 2014-06-10 DIAGNOSIS — M87180 Osteonecrosis due to drugs, jaw: Secondary | ICD-10-CM | POA: Diagnosis not present

## 2014-06-10 DIAGNOSIS — Z9181 History of falling: Secondary | ICD-10-CM | POA: Diagnosis not present

## 2014-06-10 DIAGNOSIS — S2239XD Fracture of one rib, unspecified side, subsequent encounter for fracture with routine healing: Secondary | ICD-10-CM | POA: Diagnosis not present

## 2014-06-10 DIAGNOSIS — Z79899 Other long term (current) drug therapy: Secondary | ICD-10-CM | POA: Diagnosis not present

## 2014-06-10 DIAGNOSIS — R530 Neoplastic (malignant) related fatigue: Secondary | ICD-10-CM | POA: Diagnosis not present

## 2014-06-19 ENCOUNTER — Emergency Department: Payer: Self-pay | Admitting: Emergency Medicine

## 2014-06-19 DIAGNOSIS — S7002XA Contusion of left hip, initial encounter: Secondary | ICD-10-CM | POA: Diagnosis not present

## 2014-06-19 DIAGNOSIS — S2232XA Fracture of one rib, left side, initial encounter for closed fracture: Secondary | ICD-10-CM | POA: Diagnosis not present

## 2014-06-19 DIAGNOSIS — M25552 Pain in left hip: Secondary | ICD-10-CM | POA: Diagnosis not present

## 2014-06-19 DIAGNOSIS — R072 Precordial pain: Secondary | ICD-10-CM | POA: Diagnosis not present

## 2014-06-19 DIAGNOSIS — S79912A Unspecified injury of left hip, initial encounter: Secondary | ICD-10-CM | POA: Diagnosis not present

## 2014-06-19 DIAGNOSIS — I10 Essential (primary) hypertension: Secondary | ICD-10-CM | POA: Diagnosis not present

## 2014-06-19 DIAGNOSIS — S299XXA Unspecified injury of thorax, initial encounter: Secondary | ICD-10-CM | POA: Diagnosis not present

## 2014-06-19 DIAGNOSIS — Z720 Tobacco use: Secondary | ICD-10-CM | POA: Diagnosis not present

## 2014-06-23 DIAGNOSIS — D759 Disease of blood and blood-forming organs, unspecified: Secondary | ICD-10-CM | POA: Diagnosis not present

## 2014-06-23 DIAGNOSIS — C9000 Multiple myeloma not having achieved remission: Secondary | ICD-10-CM | POA: Diagnosis not present

## 2014-06-23 DIAGNOSIS — Z9221 Personal history of antineoplastic chemotherapy: Secondary | ICD-10-CM | POA: Diagnosis not present

## 2014-06-23 DIAGNOSIS — Z923 Personal history of irradiation: Secondary | ICD-10-CM | POA: Diagnosis not present

## 2014-06-23 LAB — CBC CANCER CENTER
BASOS ABS: 0 x10 3/mm (ref 0.0–0.1)
Basophil %: 2.6 %
EOS PCT: 4.7 %
Eosinophil #: 0.1 x10 3/mm (ref 0.0–0.7)
HCT: 33.4 % — ABNORMAL LOW (ref 35.0–47.0)
HGB: 10.9 g/dL — ABNORMAL LOW (ref 12.0–16.0)
Lymphocyte #: 0.1 x10 3/mm — ABNORMAL LOW (ref 1.0–3.6)
Lymphocyte %: 10 %
MCH: 30.5 pg (ref 26.0–34.0)
MCHC: 32.8 g/dL (ref 32.0–36.0)
MCV: 93 fL (ref 80–100)
MONO ABS: 0.4 x10 3/mm (ref 0.2–0.9)
Monocyte %: 38.8 %
NEUTROS PCT: 43.9 %
Neutrophil #: 0.5 x10 3/mm — ABNORMAL LOW (ref 1.4–6.5)
PLATELETS: 218 x10 3/mm (ref 150–440)
RBC: 3.59 10*6/uL — ABNORMAL LOW (ref 3.80–5.20)
RDW: 15.2 % — ABNORMAL HIGH (ref 11.5–14.5)
WBC: 1.1 x10 3/mm — CL (ref 3.6–11.0)

## 2014-06-23 LAB — COMPREHENSIVE METABOLIC PANEL
ALK PHOS: 93 U/L
ALT: 17 U/L
Albumin: 2.5 g/dL — ABNORMAL LOW (ref 3.4–5.0)
Anion Gap: 8 (ref 7–16)
BILIRUBIN TOTAL: 0.5 mg/dL (ref 0.2–1.0)
BUN: 21 mg/dL — ABNORMAL HIGH (ref 7–18)
CO2: 28 mmol/L (ref 21–32)
Calcium, Total: 7.9 mg/dL — ABNORMAL LOW (ref 8.5–10.1)
Chloride: 97 mmol/L — ABNORMAL LOW (ref 98–107)
Creatinine: 0.96 mg/dL (ref 0.60–1.30)
EGFR (African American): >60
GFR CALC NON AF AMER: 58 — AB
Glucose: 83 mg/dL (ref 65–99)
Osmolality: 268 (ref 275–301)
Potassium: 4.2 mmol/L (ref 3.5–5.1)
SGOT(AST): 15 U/L (ref 15–37)
Sodium: 133 mmol/L — ABNORMAL LOW (ref 136–145)
Total Protein: 6.1 g/dL — ABNORMAL LOW (ref 6.4–8.2)

## 2014-06-24 DIAGNOSIS — R5383 Other fatigue: Secondary | ICD-10-CM | POA: Diagnosis not present

## 2014-06-24 DIAGNOSIS — R279 Unspecified lack of coordination: Secondary | ICD-10-CM | POA: Diagnosis not present

## 2014-06-24 DIAGNOSIS — C9000 Multiple myeloma not having achieved remission: Secondary | ICD-10-CM | POA: Diagnosis not present

## 2014-06-24 DIAGNOSIS — R269 Unspecified abnormalities of gait and mobility: Secondary | ICD-10-CM | POA: Diagnosis not present

## 2014-06-24 DIAGNOSIS — S2232XD Fracture of one rib, left side, subsequent encounter for fracture with routine healing: Secondary | ICD-10-CM | POA: Diagnosis not present

## 2014-06-24 DIAGNOSIS — Z9181 History of falling: Secondary | ICD-10-CM | POA: Diagnosis not present

## 2014-06-24 LAB — KAPPA/LAMBDA FREE LIGHT CHAINS (ARMC)

## 2014-06-27 DIAGNOSIS — R269 Unspecified abnormalities of gait and mobility: Secondary | ICD-10-CM | POA: Diagnosis not present

## 2014-06-27 DIAGNOSIS — R5383 Other fatigue: Secondary | ICD-10-CM | POA: Diagnosis not present

## 2014-06-27 DIAGNOSIS — R279 Unspecified lack of coordination: Secondary | ICD-10-CM | POA: Diagnosis not present

## 2014-06-27 DIAGNOSIS — Z9181 History of falling: Secondary | ICD-10-CM | POA: Diagnosis not present

## 2014-06-27 DIAGNOSIS — S2232XD Fracture of one rib, left side, subsequent encounter for fracture with routine healing: Secondary | ICD-10-CM | POA: Diagnosis not present

## 2014-06-27 DIAGNOSIS — C9000 Multiple myeloma not having achieved remission: Secondary | ICD-10-CM | POA: Diagnosis not present

## 2014-06-28 DIAGNOSIS — R5383 Other fatigue: Secondary | ICD-10-CM | POA: Diagnosis not present

## 2014-06-28 DIAGNOSIS — C9000 Multiple myeloma not having achieved remission: Secondary | ICD-10-CM | POA: Diagnosis not present

## 2014-06-28 DIAGNOSIS — S2232XD Fracture of one rib, left side, subsequent encounter for fracture with routine healing: Secondary | ICD-10-CM | POA: Diagnosis not present

## 2014-06-28 DIAGNOSIS — R269 Unspecified abnormalities of gait and mobility: Secondary | ICD-10-CM | POA: Diagnosis not present

## 2014-06-28 DIAGNOSIS — Z9181 History of falling: Secondary | ICD-10-CM | POA: Diagnosis not present

## 2014-06-28 DIAGNOSIS — R279 Unspecified lack of coordination: Secondary | ICD-10-CM | POA: Diagnosis not present

## 2014-06-29 DIAGNOSIS — R269 Unspecified abnormalities of gait and mobility: Secondary | ICD-10-CM | POA: Diagnosis not present

## 2014-06-29 DIAGNOSIS — Z9181 History of falling: Secondary | ICD-10-CM | POA: Diagnosis not present

## 2014-06-29 DIAGNOSIS — R5383 Other fatigue: Secondary | ICD-10-CM | POA: Diagnosis not present

## 2014-06-29 DIAGNOSIS — R279 Unspecified lack of coordination: Secondary | ICD-10-CM | POA: Diagnosis not present

## 2014-06-29 DIAGNOSIS — S2232XD Fracture of one rib, left side, subsequent encounter for fracture with routine healing: Secondary | ICD-10-CM | POA: Diagnosis not present

## 2014-06-29 DIAGNOSIS — C9000 Multiple myeloma not having achieved remission: Secondary | ICD-10-CM | POA: Diagnosis not present

## 2014-07-04 DIAGNOSIS — R5383 Other fatigue: Secondary | ICD-10-CM | POA: Diagnosis not present

## 2014-07-04 DIAGNOSIS — R279 Unspecified lack of coordination: Secondary | ICD-10-CM | POA: Diagnosis not present

## 2014-07-04 DIAGNOSIS — Z9181 History of falling: Secondary | ICD-10-CM | POA: Diagnosis not present

## 2014-07-04 DIAGNOSIS — S2232XD Fracture of one rib, left side, subsequent encounter for fracture with routine healing: Secondary | ICD-10-CM | POA: Diagnosis not present

## 2014-07-04 DIAGNOSIS — C9000 Multiple myeloma not having achieved remission: Secondary | ICD-10-CM | POA: Diagnosis not present

## 2014-07-04 DIAGNOSIS — R269 Unspecified abnormalities of gait and mobility: Secondary | ICD-10-CM | POA: Diagnosis not present

## 2014-07-07 DIAGNOSIS — C9000 Multiple myeloma not having achieved remission: Secondary | ICD-10-CM | POA: Diagnosis not present

## 2014-07-07 DIAGNOSIS — R279 Unspecified lack of coordination: Secondary | ICD-10-CM | POA: Diagnosis not present

## 2014-07-07 DIAGNOSIS — R269 Unspecified abnormalities of gait and mobility: Secondary | ICD-10-CM | POA: Diagnosis not present

## 2014-07-07 DIAGNOSIS — Z9181 History of falling: Secondary | ICD-10-CM | POA: Diagnosis not present

## 2014-07-07 DIAGNOSIS — R5383 Other fatigue: Secondary | ICD-10-CM | POA: Diagnosis not present

## 2014-07-07 DIAGNOSIS — S2232XD Fracture of one rib, left side, subsequent encounter for fracture with routine healing: Secondary | ICD-10-CM | POA: Diagnosis not present

## 2014-07-11 ENCOUNTER — Ambulatory Visit: Payer: Self-pay | Admitting: Oncology

## 2014-07-11 DIAGNOSIS — I10 Essential (primary) hypertension: Secondary | ICD-10-CM | POA: Diagnosis not present

## 2014-07-11 DIAGNOSIS — C9000 Multiple myeloma not having achieved remission: Secondary | ICD-10-CM | POA: Diagnosis not present

## 2014-07-11 DIAGNOSIS — R269 Unspecified abnormalities of gait and mobility: Secondary | ICD-10-CM | POA: Diagnosis not present

## 2014-07-11 DIAGNOSIS — I4891 Unspecified atrial fibrillation: Secondary | ICD-10-CM | POA: Diagnosis not present

## 2014-07-11 DIAGNOSIS — R279 Unspecified lack of coordination: Secondary | ICD-10-CM | POA: Diagnosis not present

## 2014-07-11 DIAGNOSIS — Z66 Do not resuscitate: Secondary | ICD-10-CM | POA: Diagnosis not present

## 2014-07-11 DIAGNOSIS — R2689 Other abnormalities of gait and mobility: Secondary | ICD-10-CM | POA: Diagnosis not present

## 2014-07-11 DIAGNOSIS — I6782 Cerebral ischemia: Secondary | ICD-10-CM | POA: Diagnosis not present

## 2014-07-11 DIAGNOSIS — M1990 Unspecified osteoarthritis, unspecified site: Secondary | ICD-10-CM | POA: Diagnosis not present

## 2014-07-11 DIAGNOSIS — Z9181 History of falling: Secondary | ICD-10-CM | POA: Diagnosis not present

## 2014-07-11 DIAGNOSIS — Z9221 Personal history of antineoplastic chemotherapy: Secondary | ICD-10-CM | POA: Diagnosis not present

## 2014-07-11 DIAGNOSIS — S2232XD Fracture of one rib, left side, subsequent encounter for fracture with routine healing: Secondary | ICD-10-CM | POA: Diagnosis not present

## 2014-07-11 DIAGNOSIS — Z923 Personal history of irradiation: Secondary | ICD-10-CM | POA: Diagnosis not present

## 2014-07-11 DIAGNOSIS — R5383 Other fatigue: Secondary | ICD-10-CM | POA: Diagnosis not present

## 2014-07-11 DIAGNOSIS — F418 Other specified anxiety disorders: Secondary | ICD-10-CM | POA: Diagnosis not present

## 2014-07-11 DIAGNOSIS — R410 Disorientation, unspecified: Secondary | ICD-10-CM | POA: Diagnosis not present

## 2014-07-11 DIAGNOSIS — Z79899 Other long term (current) drug therapy: Secondary | ICD-10-CM | POA: Diagnosis not present

## 2014-07-11 DIAGNOSIS — Z8781 Personal history of (healed) traumatic fracture: Secondary | ICD-10-CM | POA: Diagnosis not present

## 2014-07-11 DIAGNOSIS — R531 Weakness: Secondary | ICD-10-CM | POA: Diagnosis not present

## 2014-07-11 DIAGNOSIS — Z7982 Long term (current) use of aspirin: Secondary | ICD-10-CM | POA: Diagnosis not present

## 2014-07-11 DIAGNOSIS — I2510 Atherosclerotic heart disease of native coronary artery without angina pectoris: Secondary | ICD-10-CM | POA: Diagnosis not present

## 2014-07-11 DIAGNOSIS — K047 Periapical abscess without sinus: Secondary | ICD-10-CM | POA: Diagnosis not present

## 2014-07-11 DIAGNOSIS — K5790 Diverticulosis of intestine, part unspecified, without perforation or abscess without bleeding: Secondary | ICD-10-CM | POA: Diagnosis not present

## 2014-07-11 DIAGNOSIS — E785 Hyperlipidemia, unspecified: Secondary | ICD-10-CM | POA: Diagnosis not present

## 2014-07-12 DIAGNOSIS — S2232XD Fracture of one rib, left side, subsequent encounter for fracture with routine healing: Secondary | ICD-10-CM | POA: Diagnosis not present

## 2014-07-12 DIAGNOSIS — Z9181 History of falling: Secondary | ICD-10-CM | POA: Diagnosis not present

## 2014-07-12 DIAGNOSIS — R5383 Other fatigue: Secondary | ICD-10-CM | POA: Diagnosis not present

## 2014-07-12 DIAGNOSIS — R279 Unspecified lack of coordination: Secondary | ICD-10-CM | POA: Diagnosis not present

## 2014-07-12 DIAGNOSIS — C9000 Multiple myeloma not having achieved remission: Secondary | ICD-10-CM | POA: Diagnosis not present

## 2014-07-12 DIAGNOSIS — R269 Unspecified abnormalities of gait and mobility: Secondary | ICD-10-CM | POA: Diagnosis not present

## 2014-07-13 DIAGNOSIS — C9000 Multiple myeloma not having achieved remission: Secondary | ICD-10-CM | POA: Diagnosis not present

## 2014-07-13 DIAGNOSIS — Z9181 History of falling: Secondary | ICD-10-CM | POA: Diagnosis not present

## 2014-07-13 DIAGNOSIS — S2232XD Fracture of one rib, left side, subsequent encounter for fracture with routine healing: Secondary | ICD-10-CM | POA: Diagnosis not present

## 2014-07-13 DIAGNOSIS — R5383 Other fatigue: Secondary | ICD-10-CM | POA: Diagnosis not present

## 2014-07-13 DIAGNOSIS — R279 Unspecified lack of coordination: Secondary | ICD-10-CM | POA: Diagnosis not present

## 2014-07-13 DIAGNOSIS — R269 Unspecified abnormalities of gait and mobility: Secondary | ICD-10-CM | POA: Diagnosis not present

## 2014-07-14 DIAGNOSIS — Z9181 History of falling: Secondary | ICD-10-CM | POA: Diagnosis not present

## 2014-07-14 DIAGNOSIS — C9000 Multiple myeloma not having achieved remission: Secondary | ICD-10-CM | POA: Diagnosis not present

## 2014-07-14 DIAGNOSIS — R2689 Other abnormalities of gait and mobility: Secondary | ICD-10-CM | POA: Diagnosis not present

## 2014-07-14 DIAGNOSIS — R410 Disorientation, unspecified: Secondary | ICD-10-CM | POA: Diagnosis not present

## 2014-07-18 ENCOUNTER — Ambulatory Visit: Payer: Self-pay

## 2014-07-19 DIAGNOSIS — C9000 Multiple myeloma not having achieved remission: Secondary | ICD-10-CM | POA: Diagnosis not present

## 2014-07-19 DIAGNOSIS — R269 Unspecified abnormalities of gait and mobility: Secondary | ICD-10-CM | POA: Diagnosis not present

## 2014-07-19 DIAGNOSIS — R6884 Jaw pain: Secondary | ICD-10-CM | POA: Diagnosis not present

## 2014-07-19 DIAGNOSIS — R5383 Other fatigue: Secondary | ICD-10-CM | POA: Diagnosis not present

## 2014-07-19 DIAGNOSIS — S2232XD Fracture of one rib, left side, subsequent encounter for fracture with routine healing: Secondary | ICD-10-CM | POA: Diagnosis not present

## 2014-07-19 DIAGNOSIS — R220 Localized swelling, mass and lump, head: Secondary | ICD-10-CM | POA: Diagnosis not present

## 2014-07-19 DIAGNOSIS — Z9181 History of falling: Secondary | ICD-10-CM | POA: Diagnosis not present

## 2014-07-19 DIAGNOSIS — R279 Unspecified lack of coordination: Secondary | ICD-10-CM | POA: Diagnosis not present

## 2014-07-21 DIAGNOSIS — G319 Degenerative disease of nervous system, unspecified: Secondary | ICD-10-CM | POA: Diagnosis not present

## 2014-07-21 DIAGNOSIS — R410 Disorientation, unspecified: Secondary | ICD-10-CM | POA: Diagnosis not present

## 2014-07-21 DIAGNOSIS — R413 Other amnesia: Secondary | ICD-10-CM | POA: Diagnosis not present

## 2014-07-21 DIAGNOSIS — I6782 Cerebral ischemia: Secondary | ICD-10-CM | POA: Diagnosis not present

## 2014-07-23 ENCOUNTER — Emergency Department: Payer: Self-pay | Admitting: Internal Medicine

## 2014-07-23 DIAGNOSIS — K047 Periapical abscess without sinus: Secondary | ICD-10-CM | POA: Diagnosis not present

## 2014-07-23 DIAGNOSIS — R6884 Jaw pain: Secondary | ICD-10-CM | POA: Diagnosis not present

## 2014-07-26 DIAGNOSIS — C9000 Multiple myeloma not having achieved remission: Secondary | ICD-10-CM | POA: Diagnosis not present

## 2014-07-26 DIAGNOSIS — K047 Periapical abscess without sinus: Secondary | ICD-10-CM | POA: Diagnosis not present

## 2014-07-26 DIAGNOSIS — R531 Weakness: Secondary | ICD-10-CM | POA: Diagnosis not present

## 2014-07-26 DIAGNOSIS — R410 Disorientation, unspecified: Secondary | ICD-10-CM | POA: Diagnosis not present

## 2014-08-02 DIAGNOSIS — R6884 Jaw pain: Secondary | ICD-10-CM | POA: Diagnosis not present

## 2014-08-02 DIAGNOSIS — K118 Other diseases of salivary glands: Secondary | ICD-10-CM | POA: Diagnosis not present

## 2014-08-04 ENCOUNTER — Ambulatory Visit: Payer: Self-pay | Admitting: Family Medicine

## 2014-08-04 DIAGNOSIS — L0201 Cutaneous abscess of face: Secondary | ICD-10-CM | POA: Diagnosis not present

## 2014-08-04 DIAGNOSIS — R220 Localized swelling, mass and lump, head: Secondary | ICD-10-CM | POA: Diagnosis not present

## 2014-08-04 DIAGNOSIS — R6884 Jaw pain: Secondary | ICD-10-CM | POA: Diagnosis not present

## 2014-08-04 DIAGNOSIS — Z8739 Personal history of other diseases of the musculoskeletal system and connective tissue: Secondary | ICD-10-CM | POA: Diagnosis not present

## 2014-08-04 DIAGNOSIS — M272 Inflammatory conditions of jaws: Secondary | ICD-10-CM | POA: Diagnosis not present

## 2014-08-06 DIAGNOSIS — Z96652 Presence of left artificial knee joint: Secondary | ICD-10-CM | POA: Diagnosis not present

## 2014-08-06 DIAGNOSIS — Z882 Allergy status to sulfonamides status: Secondary | ICD-10-CM | POA: Diagnosis not present

## 2014-08-06 DIAGNOSIS — Z9109 Other allergy status, other than to drugs and biological substances: Secondary | ICD-10-CM | POA: Diagnosis not present

## 2014-08-06 DIAGNOSIS — Z79899 Other long term (current) drug therapy: Secondary | ICD-10-CM | POA: Diagnosis not present

## 2014-08-06 DIAGNOSIS — R6884 Jaw pain: Secondary | ICD-10-CM | POA: Diagnosis not present

## 2014-08-06 DIAGNOSIS — Z885 Allergy status to narcotic agent status: Secondary | ICD-10-CM | POA: Diagnosis not present

## 2014-08-06 DIAGNOSIS — Z881 Allergy status to other antibiotic agents status: Secondary | ICD-10-CM | POA: Diagnosis not present

## 2014-08-06 DIAGNOSIS — K047 Periapical abscess without sinus: Secondary | ICD-10-CM | POA: Diagnosis not present

## 2014-08-06 DIAGNOSIS — Z9221 Personal history of antineoplastic chemotherapy: Secondary | ICD-10-CM | POA: Diagnosis not present

## 2014-08-06 DIAGNOSIS — Z66 Do not resuscitate: Secondary | ICD-10-CM | POA: Diagnosis not present

## 2014-08-06 DIAGNOSIS — Z923 Personal history of irradiation: Secondary | ICD-10-CM | POA: Diagnosis not present

## 2014-08-06 DIAGNOSIS — C9000 Multiple myeloma not having achieved remission: Secondary | ICD-10-CM | POA: Diagnosis not present

## 2014-08-06 DIAGNOSIS — L03211 Cellulitis of face: Secondary | ICD-10-CM | POA: Diagnosis not present

## 2014-08-06 DIAGNOSIS — L03221 Cellulitis of neck: Secondary | ICD-10-CM | POA: Diagnosis not present

## 2014-08-07 ENCOUNTER — Observation Stay: Payer: Self-pay | Admitting: Internal Medicine

## 2014-08-07 DIAGNOSIS — R220 Localized swelling, mass and lump, head: Secondary | ICD-10-CM | POA: Diagnosis not present

## 2014-08-07 DIAGNOSIS — M272 Inflammatory conditions of jaws: Secondary | ICD-10-CM | POA: Diagnosis not present

## 2014-08-07 DIAGNOSIS — C9000 Multiple myeloma not having achieved remission: Secondary | ICD-10-CM | POA: Diagnosis not present

## 2014-08-07 DIAGNOSIS — K122 Cellulitis and abscess of mouth: Secondary | ICD-10-CM | POA: Diagnosis not present

## 2014-08-08 DIAGNOSIS — L03211 Cellulitis of face: Secondary | ICD-10-CM | POA: Diagnosis not present

## 2014-08-08 DIAGNOSIS — C9000 Multiple myeloma not having achieved remission: Secondary | ICD-10-CM | POA: Diagnosis not present

## 2014-08-08 DIAGNOSIS — M272 Inflammatory conditions of jaws: Secondary | ICD-10-CM | POA: Diagnosis not present

## 2014-08-08 DIAGNOSIS — R531 Weakness: Secondary | ICD-10-CM | POA: Diagnosis not present

## 2014-08-08 DIAGNOSIS — Z515 Encounter for palliative care: Secondary | ICD-10-CM | POA: Diagnosis not present

## 2014-08-09 ENCOUNTER — Ambulatory Visit
Admit: 2014-08-09 | Disposition: A | Discharge status: Home / Self Care | Payer: Self-pay | Attending: Oncology | Admitting: Oncology

## 2014-08-09 DIAGNOSIS — I4891 Unspecified atrial fibrillation: Secondary | ICD-10-CM | POA: Diagnosis not present

## 2014-08-09 DIAGNOSIS — K047 Periapical abscess without sinus: Secondary | ICD-10-CM | POA: Diagnosis not present

## 2014-08-09 DIAGNOSIS — R2689 Other abnormalities of gait and mobility: Secondary | ICD-10-CM | POA: Diagnosis not present

## 2014-08-09 DIAGNOSIS — Z9221 Personal history of antineoplastic chemotherapy: Secondary | ICD-10-CM | POA: Diagnosis not present

## 2014-08-09 DIAGNOSIS — Z515 Encounter for palliative care: Secondary | ICD-10-CM | POA: Diagnosis not present

## 2014-08-09 DIAGNOSIS — K5790 Diverticulosis of intestine, part unspecified, without perforation or abscess without bleeding: Secondary | ICD-10-CM | POA: Diagnosis not present

## 2014-08-09 DIAGNOSIS — Z8781 Personal history of (healed) traumatic fracture: Secondary | ICD-10-CM | POA: Diagnosis not present

## 2014-08-09 DIAGNOSIS — L03211 Cellulitis of face: Secondary | ICD-10-CM | POA: Diagnosis not present

## 2014-08-09 DIAGNOSIS — C9000 Multiple myeloma not having achieved remission: Secondary | ICD-10-CM | POA: Diagnosis not present

## 2014-08-09 DIAGNOSIS — Z9181 History of falling: Secondary | ICD-10-CM | POA: Diagnosis not present

## 2014-08-09 DIAGNOSIS — R5383 Other fatigue: Secondary | ICD-10-CM | POA: Diagnosis not present

## 2014-08-09 DIAGNOSIS — I6782 Cerebral ischemia: Secondary | ICD-10-CM | POA: Diagnosis not present

## 2014-08-09 DIAGNOSIS — Z79899 Other long term (current) drug therapy: Secondary | ICD-10-CM | POA: Diagnosis not present

## 2014-08-09 DIAGNOSIS — E785 Hyperlipidemia, unspecified: Secondary | ICD-10-CM | POA: Diagnosis not present

## 2014-08-09 DIAGNOSIS — R531 Weakness: Secondary | ICD-10-CM | POA: Diagnosis not present

## 2014-08-09 DIAGNOSIS — I2510 Atherosclerotic heart disease of native coronary artery without angina pectoris: Secondary | ICD-10-CM | POA: Diagnosis not present

## 2014-08-09 DIAGNOSIS — F418 Other specified anxiety disorders: Secondary | ICD-10-CM | POA: Diagnosis not present

## 2014-08-09 DIAGNOSIS — R410 Disorientation, unspecified: Secondary | ICD-10-CM | POA: Diagnosis not present

## 2014-08-09 DIAGNOSIS — Z7982 Long term (current) use of aspirin: Secondary | ICD-10-CM | POA: Diagnosis not present

## 2014-08-09 DIAGNOSIS — I10 Essential (primary) hypertension: Secondary | ICD-10-CM | POA: Diagnosis not present

## 2014-08-09 DIAGNOSIS — R270 Ataxia, unspecified: Secondary | ICD-10-CM | POA: Diagnosis not present

## 2014-08-09 DIAGNOSIS — Z923 Personal history of irradiation: Secondary | ICD-10-CM | POA: Diagnosis not present

## 2014-08-09 DIAGNOSIS — M1990 Unspecified osteoarthritis, unspecified site: Secondary | ICD-10-CM | POA: Diagnosis not present

## 2014-08-09 DIAGNOSIS — M272 Inflammatory conditions of jaws: Secondary | ICD-10-CM | POA: Diagnosis not present

## 2014-08-09 DIAGNOSIS — Z66 Do not resuscitate: Secondary | ICD-10-CM | POA: Diagnosis not present

## 2014-08-10 DIAGNOSIS — R531 Weakness: Secondary | ICD-10-CM | POA: Diagnosis not present

## 2014-08-10 DIAGNOSIS — L03211 Cellulitis of face: Secondary | ICD-10-CM | POA: Diagnosis not present

## 2014-08-10 DIAGNOSIS — M272 Inflammatory conditions of jaws: Secondary | ICD-10-CM | POA: Diagnosis not present

## 2014-08-10 DIAGNOSIS — C9000 Multiple myeloma not having achieved remission: Secondary | ICD-10-CM | POA: Diagnosis not present

## 2014-08-11 ENCOUNTER — Ambulatory Visit: Payer: Self-pay | Admitting: Physician Assistant

## 2014-08-11 DIAGNOSIS — S63502A Unspecified sprain of left wrist, initial encounter: Secondary | ICD-10-CM | POA: Diagnosis not present

## 2014-08-11 DIAGNOSIS — I10 Essential (primary) hypertension: Secondary | ICD-10-CM | POA: Diagnosis not present

## 2014-08-11 DIAGNOSIS — M1990 Unspecified osteoarthritis, unspecified site: Secondary | ICD-10-CM | POA: Diagnosis not present

## 2014-08-11 DIAGNOSIS — M25532 Pain in left wrist: Secondary | ICD-10-CM | POA: Diagnosis not present

## 2014-08-11 DIAGNOSIS — I4891 Unspecified atrial fibrillation: Secondary | ICD-10-CM | POA: Diagnosis not present

## 2014-08-11 DIAGNOSIS — C9000 Multiple myeloma not having achieved remission: Secondary | ICD-10-CM | POA: Diagnosis not present

## 2014-08-11 DIAGNOSIS — E785 Hyperlipidemia, unspecified: Secondary | ICD-10-CM | POA: Diagnosis not present

## 2014-08-11 DIAGNOSIS — Z90710 Acquired absence of both cervix and uterus: Secondary | ICD-10-CM | POA: Diagnosis not present

## 2014-08-11 DIAGNOSIS — Z96652 Presence of left artificial knee joint: Secondary | ICD-10-CM | POA: Diagnosis not present

## 2014-08-16 DIAGNOSIS — M8448XA Pathological fracture, other site, initial encounter for fracture: Secondary | ICD-10-CM | POA: Diagnosis not present

## 2014-08-16 DIAGNOSIS — H05012 Cellulitis of left orbit: Secondary | ICD-10-CM | POA: Diagnosis not present

## 2014-08-18 DIAGNOSIS — I480 Paroxysmal atrial fibrillation: Secondary | ICD-10-CM | POA: Diagnosis not present

## 2014-08-19 DIAGNOSIS — M25532 Pain in left wrist: Secondary | ICD-10-CM | POA: Diagnosis not present

## 2014-08-22 DIAGNOSIS — R410 Disorientation, unspecified: Secondary | ICD-10-CM | POA: Diagnosis not present

## 2014-08-22 DIAGNOSIS — C9000 Multiple myeloma not having achieved remission: Secondary | ICD-10-CM | POA: Diagnosis not present

## 2014-08-22 DIAGNOSIS — R2689 Other abnormalities of gait and mobility: Secondary | ICD-10-CM | POA: Diagnosis not present

## 2014-09-09 ENCOUNTER — Ambulatory Visit
Admit: 2014-09-09 | Disposition: A | Discharge status: Home / Self Care | Payer: Self-pay | Attending: Oncology | Admitting: Oncology

## 2014-09-09 DIAGNOSIS — Z9221 Personal history of antineoplastic chemotherapy: Secondary | ICD-10-CM | POA: Diagnosis not present

## 2014-09-09 DIAGNOSIS — Z79899 Other long term (current) drug therapy: Secondary | ICD-10-CM | POA: Diagnosis not present

## 2014-09-09 DIAGNOSIS — R5383 Other fatigue: Secondary | ICD-10-CM | POA: Diagnosis not present

## 2014-09-09 DIAGNOSIS — K5790 Diverticulosis of intestine, part unspecified, without perforation or abscess without bleeding: Secondary | ICD-10-CM | POA: Diagnosis not present

## 2014-09-09 DIAGNOSIS — I10 Essential (primary) hypertension: Secondary | ICD-10-CM | POA: Diagnosis not present

## 2014-09-09 DIAGNOSIS — K047 Periapical abscess without sinus: Secondary | ICD-10-CM | POA: Diagnosis not present

## 2014-09-09 DIAGNOSIS — I6782 Cerebral ischemia: Secondary | ICD-10-CM | POA: Diagnosis not present

## 2014-09-09 DIAGNOSIS — E785 Hyperlipidemia, unspecified: Secondary | ICD-10-CM | POA: Diagnosis not present

## 2014-09-09 DIAGNOSIS — R531 Weakness: Secondary | ICD-10-CM | POA: Diagnosis not present

## 2014-09-09 DIAGNOSIS — Z9181 History of falling: Secondary | ICD-10-CM | POA: Diagnosis not present

## 2014-09-09 DIAGNOSIS — I4891 Unspecified atrial fibrillation: Secondary | ICD-10-CM | POA: Diagnosis not present

## 2014-09-09 DIAGNOSIS — R410 Disorientation, unspecified: Secondary | ICD-10-CM | POA: Diagnosis not present

## 2014-09-09 DIAGNOSIS — F418 Other specified anxiety disorders: Secondary | ICD-10-CM | POA: Diagnosis not present

## 2014-09-09 DIAGNOSIS — R2689 Other abnormalities of gait and mobility: Secondary | ICD-10-CM | POA: Diagnosis not present

## 2014-09-09 DIAGNOSIS — Z923 Personal history of irradiation: Secondary | ICD-10-CM | POA: Diagnosis not present

## 2014-09-09 DIAGNOSIS — Z7982 Long term (current) use of aspirin: Secondary | ICD-10-CM | POA: Diagnosis not present

## 2014-09-09 DIAGNOSIS — I2510 Atherosclerotic heart disease of native coronary artery without angina pectoris: Secondary | ICD-10-CM | POA: Diagnosis not present

## 2014-09-09 DIAGNOSIS — C9000 Multiple myeloma not having achieved remission: Secondary | ICD-10-CM | POA: Diagnosis not present

## 2014-09-09 DIAGNOSIS — Z8781 Personal history of (healed) traumatic fracture: Secondary | ICD-10-CM | POA: Diagnosis not present

## 2014-09-09 DIAGNOSIS — Z66 Do not resuscitate: Secondary | ICD-10-CM | POA: Diagnosis not present

## 2014-09-09 DIAGNOSIS — M1990 Unspecified osteoarthritis, unspecified site: Secondary | ICD-10-CM | POA: Diagnosis not present

## 2014-09-19 DIAGNOSIS — C9000 Multiple myeloma not having achieved remission: Secondary | ICD-10-CM | POA: Diagnosis not present

## 2014-09-19 LAB — COMPREHENSIVE METABOLIC PANEL
Albumin: 3.9 g/dL
Alkaline Phosphatase: 85 U/L
Anion Gap: 6 — ABNORMAL LOW (ref 7–16)
BUN: 15 mg/dL
Bilirubin,Total: 0.6 mg/dL
CHLORIDE: 103 mmol/L
Calcium, Total: 9 mg/dL
Co2: 29 mmol/L
Creatinine: 0.69 mg/dL
EGFR (African American): >60
Glucose: 91 mg/dL
Potassium: 4 mmol/L
SGOT(AST): 20 U/L
SGPT (ALT): 16 U/L
Sodium: 138 mmol/L
Total Protein: 6.8 g/dL

## 2014-09-19 LAB — CBC CANCER CENTER
BASOS ABS: 0.1 x10 3/mm (ref 0.0–0.1)
Basophil %: 3.6 %
Eosinophil #: 0 x10 3/mm (ref 0.0–0.7)
Eosinophil %: 1.2 %
HCT: 38 % (ref 35.0–47.0)
HGB: 12.4 g/dL (ref 12.0–16.0)
LYMPHS ABS: 0.8 x10 3/mm — AB (ref 1.0–3.6)
LYMPHS PCT: 31.1 %
MCH: 29.9 pg (ref 26.0–34.0)
MCHC: 32.7 g/dL (ref 32.0–36.0)
MCV: 92 fL (ref 80–100)
MONOS PCT: 15.6 %
Monocyte #: 0.4 x10 3/mm (ref 0.2–0.9)
Neutrophil #: 1.2 x10 3/mm — ABNORMAL LOW (ref 1.4–6.5)
Neutrophil %: 48.5 %
Platelet: 288 x10 3/mm (ref 150–440)
RBC: 4.15 10*6/uL (ref 3.80–5.20)
RDW: 16.5 % — AB (ref 11.5–14.5)
WBC: 2.6 x10 3/mm — AB (ref 3.6–11.0)

## 2014-09-20 LAB — KAPPA/LAMBDA FREE LIGHT CHAINS (ARMC)

## 2014-09-20 LAB — PROT IMMUNOELECTROPHORES(ARMC)

## 2014-09-27 NOTE — Op Note (Signed)
PATIENT NAME:  [name], [name] MR#:  [id number] DATE OF BIRTH:  Jun 05, 1925  DATE OF PROCEDURE:  02/04/2012  PREOPERATIVE DIAGNOSIS: Urethral caruncle.   POSTOPERATIVE DIAGNOSIS: Urethral caruncle.   PROCEDURE: Urethral caruncle excision.   SURGEON: Edrick Oh, MD  ANESTHESIA: Laryngeal mask airway anesthesia.   INDICATIONS: The patient is an 81 year old white female with a history of urethral caruncle. She has had irritation and bleeding from this at intermittent points in the past. She has at a break in her chemotherapy treatment for other issues. She wishes to proceed with urethral caruncle excision. She presents today for this purpose.   DESCRIPTION OF PROCEDURE: After informed consent was obtained, the patient was taken to the Operating Room and placed in the dorsal lithotomy position under laryngeal mask airway anesthesia, and the patient was then prepped and draped in the usual standard fashion. A 16-French Foley catheter was placed to gravity drainage without difficulty. The caruncle was noted to extend from approximately the 1:00 to 9:00 position. A circumferential incision was made at the level of the caruncle and normal periurethral tissue. A mild amount of bleeding was encountered. This was controlled with pressure. The inner mucosa was pulled outward slightly utilizing pick-ups. Small incisions were made through the mucosa starting at the 11:00 position. Intermittent 4-0 Vicryl sutures were then placed completing the excision zone. The caruncle was removed in its entirety. Several additional sutures were placed for hemostasis. Cystoscopy was then performed after removal of a Foley catheter with a 22-French  rigid cystoscope. No other urethral abnormalities were noted. The bladder was inspected in its entirety with no gross mucosal lesions noted. Bilateral ureteral orifices were well visualized with no lesions noted. The bladder was drained. The cystoscope was removed. The patient was  returned to the supine position. Premarin cream was applied to the meatus. The patient was awakened from laryngeal mask airway anesthesia. She was taken to the recovery room in stable condition. There were no problems or complications. The patient tolerated the procedure well. The specimen will be sent to pathology for further evaluation.   ESTIMATED BLOOD LOSS: Minimal.    ____________________________ Denice Bors. Jacqlyn Larsen, MD bsc:cbb D: 02/04/2012 12:48:24 ET T: 02/04/2012 13:03:00 ET JOB#: [id number]  cc: Denice Bors. Jacqlyn Larsen, MD, <Dictator> Denice Bors Karron Goens MD ELECTRONICALLY SIGNED 02/06/2012 0:31

## 2014-09-30 NOTE — Op Note (Signed)
PATIENT NAME:  [name], [name] MR#:  [id number] DATE OF BIRTH:  September 03, 1924  DATE OF PROCEDURE:  10/07/2012  PROCEDURES PERFORMED:  1. Pars plana vitrectomy of the left eye.  2. Internal limiting membrane peel of the left eye.  3. Endolaser, left eye.  PREOPERATIVE DIAGNOSES:  1. Epiretinal membrane of the left eye.  2. Retinal edema, left eye.   POSTOPERATIVE DIAGNOSES:  1. Epiretinal membrane of the left eye.  2. Retinal tear of the left eye.   PRIMARY SURGEON: Teresa Pelton. Nevin Grizzle, MD  ANESTHESIA: Retrobulbar block of the left eye with monitored anesthesia care.   COMPLICATIONS: None.   INDICATIONS FOR PROCEDURE: This is a patient who I have followed in my office with slowly decreasing vision in the left eye. Examination revealed an epiretinal membrane and resultant retinal edema with a visual acuity of 20/60. Risks, benefits and alternatives of the above procedure were discussed, and the patient wished to proceed.   DETAILS OF PROCEDURE: After informed consent was obtained, the patient was brought into the operative suite at [facility]. The patient was placed in supine position and was given a small dose of Alfenta, and a retrobulbar block was performed on the left eye by the primary surgeon without any complication. The left eye was prepped and draped in sterile manner. After lid speculum was inserted, a 25-gauge trocar was placed inferotemporally through displaced conjunctiva in an oblique fashion 3 mm beyond the limbus. The infusion cannula was turned on and inserted through the trocar and secured in position with Steri-Strips. Two more trocars were placed in a similar fashion superotemporally and superonasally. Vitreous cutter and light pipe were introduced into the eye, and a core vitrectomy was performed. Vitreous face was confirmed as already elevated, and the peripheral vitreous was trimmed for 360 degrees. Indocyanine green was injected onto the posterior  pole and removed within 30 seconds. An internal limiting membrane peel was completed for 360 degrees around the fovea for a total diameter of 2 disk diameters. Scleral depressed exam was performed for 360 degrees. An operculated hole was noted at approximately 11 o'clock. Endolaser was introduced, and 4 rows of laser were placed around the tear. No other breaks, tears or retinal detachment could be identified for 360 degrees on scleral depressed exam repeat. A partial air-fluid exchange was performed, and the trocars were removed. The wounds were noted to be airtight. Pressure in the eye was confirmed to be approximately 15 mmHg. Then, 5 mg of dexamethasone was given into the inferior fornix, and the lid speculum was removed. The eye was cleaned, and TobraDex was placed in the eye. A patch and shield were placed over the eye, and the patient was taken to postanesthesia care with instructions to remain head up.    ____________________________ Teresa Pelton. Starling Manns, MD mfa:OSi D: 10/07/2012 11:31:26 ET T: 10/07/2012 12:08:02 ET JOB#: [id number]  cc: Teresa Pelton. Starling Manns, MD, <Dictator> Coralee Rud MD ELECTRONICALLY SIGNED 10/23/2012 13:05

## 2014-10-09 NOTE — Consult Note (Signed)
PATIENT NAME:  [name], [name] MR#:  [id number] DATE OF BIRTH:  February 25, 1925  DATE OF CONSULTATION:  08/07/2014  REFERRING PHYSICIAN:  Norva Riffle. Marcille Blanco, MD CONSULTING PHYSICIAN:  Huey Romans, MD  PRIMARY CARE PHYSICIAN:  Derinda Late, MD  HISTORY OF PRESENT ILLNESS: The patient is an 81 year old white female who has had about 4 weeks of left-sided jaw pain, seemed like it started some from when her lower denture was not fitting real well and her cheek seemed like it swelled a little bit, and the denture was cutting into it.  She tried putting something between the denture and cheek, but it continued to get worse.  She was having some swelling on and off for last couple of weeks; however, earlier this week it seemed like it really swelled more.  Dr. Baldemar Lenis, her primary care physician, ordered a CT scan on the 25th that showed signs of cellulitis, possible early abscess. She has been started on clindamycin the day before, but her symptoms were worsening some, and she presented to the Emergency Room last night. She had severe pain in left cheek. It was swollen more and red over the cheek and even redness some in the anterior neck. She has been on IV medications now since last night and this morning she is so much better. She can get her mouth open now, swelling has gone down, redness has gone away, and she feels quite well. She does not remember any kind of drainage or pus coming out inside her mouth.   PAST MEDICAL HISTORY: Significant for multiple myeloma, getting chemotherapy and radiation. This is in multiple areas of her body, but not including the skull.   PAST SURGICAL HISTORY: Left total knee replacement, hysterectomy, and appendectomy.   SOCIAL HISTORY: She is not a smoker.  FAMILY HISTORY:  Her mother died of cancer. Father had also cancer and her son is a cancer survivor.   CURRENT MEDICATIONS: Reviewed, as noted in the chart.   DRUG ALLERGIES:  BIAXIN, DARVOCET, DEMEROL,  HYDROCODONE WITH TYLENOL, SULFA DRUGS, TRAMADOL AND ULTRACET.   PHYSICAL EXAMINATION: HEAD AND NECK: The patient has a little bit of swelling over the body of the mandible area. The skin is not red here, still a little tender on palpation. The neck is not swollen. There is no obvious nodes or masses that I can feel in the neck. Inside the mouth, you can see a whitish wet scab of the inferior cheek just above the gum level. This is about 2.5 cm in length and about 6-7 mm in height. There is no purulence coming from this area, but it is tender to the touch. The rest of her oral mucosa is very healthy. She has a little bit of trismus, it does not open up wide. You can see anterior incisors and more on the right side of her jaw.  There are no molars or any teeth on the left side. She has some upper teeth that are in good repair.   IMAGING:  I reviewed the CT scan in detail. This is from the 25th which shows it looks like some cellulitis lateral to the body and ramus of the mandible underneath the masseter muscle. This was giving her the trismus.  It is hard to say if it was a definitive abscess or cellulitis in the area, and whether this was subperiosteal or not.   IMPRESSION:  The patient has had cellulitis involving the soft tissues lateral to the mandible. This apparently is seeded  by trauma to the mucosa inside the mouth that led to infection. She has responded very well with no evidence of any spontaneous rupture and it does not seem as though this is likely a true abscess, but more cellulitis and soft tissue swelling. I have my concerns that this could represent some other soft tissue neoplasm in the mouth if this does not settle down. I think she needs to remain on intravenous antibiotics for a total of 48 hours to make sure it continues to improve.  She had been on clindamycin intravenous and I think the Rocephin probably helped a lot since she had been on clindamycin for 3 days prior to admission and was  worsening instead. I would continue using cephalosporin for now as that should cover the likely bacteria that are fueling this infection. She was given 1 dose of Decadron intravenous last night and I think she could potentially use a prednisone taper as well.  Once the patient has continued to improve, she can probably go home by Tuesday morning and stay on oral antibiotics. She had an appointment arranged for her to see Dr. Richardson Landry in [place] on Tuesday, but that is too soon. We will plan to see her in [place] since she lives closer to [facility] and I have already evaluated the wound and it makes more sense for me to continue to follow her for the time being. She knows if her infection should worsen during the next week that she needs to come sooner to re-evaluate. Certainly there is nothing there that needs surgical drainage or correction now as the infection has really settled down, pain going away, and she is improving rapidly on intravenous medications. It is likely that the mouth is the main source of the cellulitis and infection and want to make sure that this heals up completely and does not represent more than just localized trauma and seeding of infection.    ____________________________ Huey Romans, MD phj:LT D: 08/07/2014 16:27:29 ET T: 08/07/2014 19:04:08 ET JOB#: [id number]  cc: Huey Romans, MD, <Dictator> Huey Romans MD ELECTRONICALLY SIGNED 08/21/2014 11:24

## 2014-10-09 NOTE — Discharge Summary (Signed)
PATIENT NAME:  [name], [name] MR#:  [id number] DATE OF BIRTH:  1924-08-17  DATE OF ADMISSION:  08/07/2014 DATE OF DISCHARGE:  08/10/2014  ADMISSION COMPLAINT: Left facial pain.   DISCHARGE DIAGNOSES: 1.  Left cheek cellulitis.  2.  Multiple myeloma.  3.  Generalized weakness.  4.  DNR status.  CONSULTATIONS: Dr. Margaretha Sheffield.  PROCEDURES: CT of the maxillofacial area with contrast shows soft tissue swelling and inflammatory changes overlying the left mandible, a 15 x 6 mm ill-defined low-density lateral to the left mandibular ramus concerning for possible small abscess.   HISTORY OF PRESENT ILLNESS: This 81 year old female with past medical history of multiple myeloma presents Emergency Room complaining of left-sided jaw pain. She has had this pain and swelling for several weeks. She was thought to have an abscess of her left mandible and was on oral clindamycin as an outpatient; however, swelling continued to increase and pain had become intolerable and so she presented to the Emergency [place]:  1.  Left cheek cellulitis: The patient was seen by Dr. Kathyrn Sheriff as an inpatient. She was treated with IV clindamycin and Rocephin as well as several doses of dexamethasone. Swelling decreased considerably during the hospitalization. She is being discharged on Ceftin and Flagyl. She has also been given instructions to use nonsteroidal anti-inflammatories to reduce inflammation and has a prescription for Percocet for pain. She will be following up with Dr. Kathyrn Sheriff in his office 1 week after discharge. It is thought that this abscess is likely due to injury to the inner cheek due to poor dentition. Blood cultures were negative.  2.  Multiple myeloma: The patient is to continue to follow with Dr. Oliva Bustard for this condition.  3.  Generalized weakness: The patient is at high risk for fall and fracture. She was seen by physical therapy during hospitalization, but declined home health  services.   DISCHARGE PHYSICAL EXAMINATION: VITAL SIGNS: Temperature 97.5, pulse 70, respirations 18, blood pressure 188/73, oxygenation 98% on room air.  GENERAL: No acute distress.  HEENT: There is a small tender swelling on the left cheek. There is some evidence of maceration on the left inner cheek. No purulent drainage.  CARDIOVASCULAR: Regular rate and rhythm. No murmurs, rubs, or gallops. No clicks. No edema. Peripheral pulses 2+.  RESPIRATORY: Clear to auscultation bilaterally with good air movement.  ABDOMEN: Soft, nontender, nondistended. No guarding or rebound. Bowel sounds normal. PSYCHIATRIC: The patient is alert and oriented with good insight into her clinical condition.   DIAGNOSTIC DATA: Blood cultures: No growth x2.   Sodium 139, potassium 3.5, chloride 106, bicarb 29, BUN 13, creatinine 0.58, glucose 100. White blood cells 4.2, hemoglobin 10, platelets 219,000, MCV 91.   DISCHARGE MEDICATIONS: 1.  Pomalyst 4 mg 1 capsule for 21 days and then 14 days off. This is to be monitored by Dr. Oliva Bustard.  2.  Ibuprofen 600 mg 1 tablet every 6 hours.  3.  Acetaminophen/ hydrocodone 325/7.5 mg 1 tablet every 6 hours as needed for pain.  4.  Ceftin 250 mg 1 tablet twice a day x10 days.  5.  Flagyl 500 mg 1 tablet every 8 hours x10 days. 6.  Zofran ODT 4 mg 1 tablet 3 times a day as needed for nausea.   CONDITION ON DISCHARGE: Stable.   DISPOSITION: Discharged to home with no further home health services as she has declined home health physical therapy and RN.   DISCHARGE DIET: Regular.   DISCHARGE ACTIVITY:  As tolerated.   TIMEFRAME FOR FOLLOWUP: Please follow up in the next week as scheduled with Dr. Kathyrn Sheriff. Please follow up with Dr. Oliva Bustard as normally scheduled.   TIME SPENT ON DISCHARGE: 45 minutes.  ____________________________ Earleen Newport. Volanda Napoleon, MD cpw:sb D: 08/11/2014 21:32:42 ET T: 08/12/2014 08:58:13 ET JOB#: [id number]  cc: Barnetta Chapel P. Volanda Napoleon, MD, <Dictator> Derinda Late, MD Huey Romans, MD Aldean Jewett MD ELECTRONICALLY SIGNED 08/20/2014 10:51

## 2014-10-09 NOTE — H&P (Signed)
PATIENT NAME:  [name], GERON MR#:  [id number] DATE OF BIRTH:  [birth year]  DATE OF ADMISSION:  08/07/2014  REFERRING PHYSICIAN: Gretchen Short. Beather Arbour, MD  PRIMARY CARE PHYSICIAN: Derinda Late, MD   ADMISSION DIAGNOSIS: Mandibular abscess.   HISTORY OF PRESENT ILLNESS: This is an 81 year old female who presents to the Emergency Department complaining of left-sided jaw pain. She has been to the Emergency Department before complaining of this same problem and has seen her dentist for the problem as well. She was thought to have an abscess of her mandible and placed on oral clindamycin; however, she states that the swelling in her left jaw has increased and that the pain is greater than has been despite antibiotic therapy. She denies any fevers, nausea, vomiting, or diarrhea. She came to the Emergency Department strictly for pain, but consultation with otolaryngology recommended admission for IV antibiotics. Thus, the Emergency Department called the internal medicine service for management of antibiotic therapy.   REVIEW OF SYSTEMS:  CONSTITUTIONAL: The patient denies fevers or weakness.  EYES: Denies blurred vision or inflammation.  EARS, NOSE AND THROAT: Denies tinnitus or sore throat.  RESPIRATORY: Denies cough or shortness of breath.  CARDIOVASCULAR: Denies chest pain, orthopnea, paroxysmal nocturnal dyspnea, or palpitations.  GASTROINTESTINAL: Denies nausea, vomiting, diarrhea, or abdominal pain.  GENITOURINARY: Denies dysuria, increased frequency, or hesitancy of urination.  ENDOCRINE: Denies polyuria or polydipsia.  HEMATOLOGIC AND LYMPHATIC: Denies easy bruising or bleeding.  INTEGUMENTARY: Denies rashes or lesions.  MUSCULOSKELETAL: Denies arthralgias or myalgias.  NEUROLOGIC: Denies numbness in extremities or dysarthria.  PSYCHIATRIC: Denies depression or suicidal ideation.   PAST MEDICAL HISTORY: Multiple myeloma status post chemotherapy and radiation.   PAST SURGICAL HISTORY: Left total  knee replacement, hysterectomy, and appendectomy.   SOCIAL HISTORY: The patient lives alone. She does not smoke or do any drugs, but she does drink 1 glass of red wine per day.   FAMILY HISTORY: Her mother is deceased of some form of adnexal cancer. Her father had some form of mediastinal cancer and her son is a prostate cancer survivor.   MEDICATIONS:  1.  Clindamycin 300 mg 1 capsule p.o. every 6 hours.  2.  Meloxicam 15 mg 1 tablet p.o. once a day.  3.  Pomalyst 4 mg 1 capsule p.o. once daily for 21 days, then 14 days off.   ALLERGIES: BIAXIN, DARVOCET, DEMEROL, HYDROCODONE WITH TYLENOL, SULFA DRUGS, TRAMADOL, ULTRACET.   PERTINENT LABORATORY RESULTS AND RADIOGRAPHIC FINDINGS: Serum glucose is 103, BUN 22, creatinine 0.74, serum sodium 142, potassium is 4.6, chloride is 110, bicarbonate of 26, calcium is 8.5. White blood cell count is 8.6, hemoglobin 10.8, hematocrit 33.9, platelet count 238,000, MCV is 93. CT of the jaw with contrast shows soft tissue swelling and inflammatory changes overlying the left mandible with 15 x 6 mm ill-defined low density lateral to the left mandibular ramus concerning for a possible small abscess.   PHYSICAL EXAMINATION:  VITAL SIGNS: Temperature is 97.9, pulse 82, respirations 18, blood pressure 190/77, pulse oximetry is 98% on room air.  GENERAL: The patient is alert and oriented x 3 in no apparent distress, although she does have a tendency to be forgetful and slightly confused, although she reorients easily.  HEENT: Normocephalic, atraumatic. Pupils equal, round, and reactive to light and accommodation. Extraocular movements are intact. Mucous membranes are moist. There is swelling and tenderness of the left inferior gumline and visible swelling of her jaw on the left.   NECK: Trachea is midline, but she  does have some adenopathy along the left anterior chain. Her thyroid is nonpalpable and nontender.  CHEST: Symmetric and atraumatic. There is a port placed  on the right chest, which has been accessed by the ED nursing staff, and is cleanly dressed.  CARDIOVASCULAR: Regular rate and rhythm. Normal S1, S2. No rubs, clicks, or murmurs appreciated.  LUNGS: Clear to auscultation bilaterally. Normal effort and excursion.  ABDOMEN: Positive bowel sounds. Soft, nontender, nondistended. No hepatosplenomegaly.  GENITOURINARY: Deferred.  MUSCULOSKELETAL: The patient moves all 4 extremities equally. There is 5/5 strength in upper and lower extremities bilaterally. I have not tested her gait.  SKIN: Warm and dry. No rashes or lesions.  EXTREMITIES: No clubbing, cyanosis, or edema.  NEUROLOGIC: Cranial nerves II-XII are grossly intact.  PSYCHIATRIC: Mood is normal. Affect is congruent. The patient has excellent insight and judgment into her medical condition.   ASSESSMENT AND PLAN: This 81 year old female admitted for mandibular abscess.  1.  Abscess, appears to be soft tissue only. There was a report that it may have involved the bone, but that is not evident on CT scan today. She has significant pain and swelling, thus we will consider her a failure of oral antibiotics. She does have port through which she can receive IV treatment, so we will simply switch to IV clindamycin at this time. She does not have any signs or symptoms of sepsis. ENT is to consult on the patient in the morning.  2.  Multiple myeloma: We are holding the patient's Pomalyst at this time, per hematology and oncology obstructions that were related to [place] by the patient. Initially, I thought that she may have a lytic lesion in her jaw from her multiple myeloma; however, that  does not appear to be case, as this abscess appears to be within the soft tissues of the mouth.  3.  Deep vein thrombosis prophylaxis: Heparin.  4.  Gastrointestinal prophylaxis: None, as the patient is not critically ill.   CODE STATUS: The patient is a full code.   TIME SPENT ON ADMISSION ORDERS AND PATIENT CARE:  Approximately 35 minutes.    ____________________________ Norva Riffle. Marcille Blanco, MD msd:bm D: 08/07/2014 04:40:17 ET T: 08/07/2014 05:01:20 ET JOB#: [id number]  cc: Norva Riffle. Marcille Blanco, MD, <Dictator> Norva Riffle Sharvi Mooneyhan MD ELECTRONICALLY SIGNED 08/18/2014 8:00

## 2014-10-16 ENCOUNTER — Ambulatory Visit
Admission: EM | Admit: 2014-10-16 | Discharge: 2014-10-16 | Disposition: A | Discharge status: Home / Self Care | Payer: Medicare Other | Attending: Internal Medicine | Admitting: Internal Medicine

## 2014-10-16 DIAGNOSIS — J0140 Acute pansinusitis, unspecified: Secondary | ICD-10-CM

## 2014-10-16 MED ORDER — FLUTICASONE PROPIONATE 50 MCG/ACT NA SUSP: 1.0000 | Freq: Every day | NASAL | Status: DC

## 2014-10-16 NOTE — ED Provider Notes (Signed)
CSN: [id number]     Arrival date & time 10/16/14  0830 History   None    Chief Complaint  Patient presents with  . URI  . Cough   HPI Patient started coughing this morning, after having the onset of clear rhinorrhea 2 days ago. Family is visiting from [place], and are concerned about the potential for serious illness. No fever known. Some MALAISE, but able to be up and dressed today. No earache, no sore throat. Does have sinus congestion, and some discoloration of phlegm today. Mild nausea, no vomiting. No change in bowel movements. Mildly dyspneic with coughing, by family's report, but not sustained. Uses a cane to walk.   Past Medical History  Diagnosis Date  . Multiple myeloma   . Osteonecrosis of jaw     left  . Anxiety   . Coronary arteriosclerosis   . Hypertension   . Hyperlipidemia   . Arthritis   . Atrial fibrillation    Past Surgical History  Procedure Laterality Date  . Appendectomy    . Abdominal hysterectomy    . Total knee revision Left   . Partial colectomy     History reviewed. No pertinent family history. History  Substance Use Topics  . Smoking status: Never Smoker   . Smokeless tobacco: Never Used  . Alcohol Use: 1.2 oz/week    2 Glasses of wine per week   OB History    No data available     Review of Systems  All other systems reviewed and are negative.   Allergies  Biaxin; Demerol; Hydrocodone; Sulfa antibiotics; Tramadol; and Ultracet  Home Medications   Prior to Admission medications   Medication Sig Start Date End Date Taking? Authorizing Provider  aspirin 81 MG chewable tablet Chew 81 mg by mouth daily.    Historical Provider, MD  ondansetron (ZOFRAN) 4 MG tablet Take 4 mg by mouth every 8 (eight) hours as needed for nausea or vomiting.    Historical Provider, MD  pomalidomide (POMALYST) 4 MG capsule Take 4 mg by mouth daily. Take with water on days 1-21. Repeat every 28 days.    Historical Provider, MD   BP 138/74 mmHg  Pulse 74   Temp(Src) 97.2 F (36.2 C) (Tympanic)  Resp 20  Ht '5\' 2"'  (1.575 m)  Wt 100 lb (45.36 kg)  BMI 18.29 kg/m2  SpO2 99% Physical Exam  Constitutional: She is oriented to person, place, and time. No distress.  Alert, nicely groomed  HENT:  Head: Atraumatic.  Bilateral TMs are translucent, no erythema Nose is mild to moderately congested Throat is slightly red with postnasal drainage She is slightly hard of hearing  Eyes:  Conjugate gaze, no eye redness/drainage  Neck: Neck supple.  Cardiovascular: Normal rate and regular rhythm.   Not tachycardic, not on meds that would slow heart rate  Pulmonary/Chest: No respiratory distress. She has no wheezes. She has no rales.  Lungs clear, symmetric breath sounds  Abdominal: She exhibits no distension.  Musculoskeletal:  No leg swelling Using a 4 prong cane  Neurological: She is alert and oriented to person, place, and time.  Skin: Skin is warm and dry.  No cyanosis  Nursing note and vitals reviewed.   ED Course  Procedures (including critical care time) Labs Review Labs Reviewed - No data to display  Imaging Review No results found.   MDM   1. Acute pansinusitis, recurrence not specified    Due to upper respiratory infection, probably viral. However, patient is  somewhat immunosuppressed, due to treatment for multiple myeloma. She does have history of GI side effects to many antibiotics. Have decided to hold off on antibiotic therapy at present(afebrile, not tachycardic, not hypoxic, able to walk into urgent care with usual assistive device), but have encouraged family/patient to seek care for worsening malaise, anorexia, sustained breathlessness, fever, or worsening phlegm production day by day. Anticipated time course of resolution would be gradual over the next 2 weeks Rx for Flonase, and might benefit from over-the-counter Mucinex with an expectorant.    Sherlene Shams, MD 10/16/14 1003

## 2014-10-16 NOTE — ED Notes (Signed)
Patient sick for the past few days with nasal congestion, coughed up yellow mucous this am.

## 2014-10-16 NOTE — Discharge Instructions (Signed)
Upper Respiratory Infection, Adult An upper respiratory infection (URI) is also sometimes known as the common cold. The upper respiratory tract includes the nose, sinuses, throat, trachea, and bronchi. Bronchi are the airways leading to the lungs. Most people improve within 1 week, but symptoms can last up to 2 weeks. A residual cough may last even longer.  CAUSES Many different viruses can infect the tissues lining the upper respiratory tract. The tissues become irritated and inflamed and often become very moist. Mucus production is also common. A cold is contagious. You can easily spread the virus to others by oral contact. This includes kissing, sharing a glass, coughing, or sneezing. Touching your mouth or nose and then touching a surface, which is then touched by another person, can also spread the virus. SYMPTOMS  Symptoms typically develop 1 to 3 days after you come in contact with a cold virus. Symptoms vary from person to person. They may include:  Runny nose.  Sneezing.  Nasal congestion.  Sinus irritation.  Sore throat.  Loss of voice (laryngitis).  Cough.  Fatigue.  Muscle aches.  Loss of appetite.  Headache.  Low-grade fever. DIAGNOSIS  You might diagnose your own cold based on familiar symptoms, since most people get a cold 2 to 3 times a year. Your caregiver can confirm this based on your exam. Most importantly, your caregiver can check that your symptoms are not due to another disease such as strep throat, sinusitis, pneumonia, asthma, or epiglottitis. Blood tests, throat tests, and X-rays are not necessary to diagnose a common cold, but they may sometimes be helpful in excluding other more serious diseases. Your caregiver will decide if any further tests are required. RISKS AND COMPLICATIONS  You may be at risk for a more severe case of the common cold if you smoke cigarettes, have chronic heart disease (such as heart failure) or lung disease (such as asthma), or if  you have a weakened immune system. The very young and very old are also at risk for more serious infections. Bacterial sinusitis, middle ear infections, and bacterial pneumonia can complicate the common cold. The common cold can worsen asthma and chronic obstructive pulmonary disease (COPD). Sometimes, these complications can require emergency medical care and may be life-threatening. PREVENTION  The best way to protect against getting a cold is to practice good hygiene. Avoid oral or hand contact with people with cold symptoms. Wash your hands often if contact occurs. There is no clear evidence that vitamin C, vitamin E, echinacea, or exercise reduces the chance of developing a cold. However, it is always recommended to get plenty of rest and practice good nutrition. TREATMENT  Treatment is directed at relieving symptoms. There is no cure. Antibiotics are not effective, because the infection is caused by a virus, not by bacteria. Treatment may include:  Increased fluid intake. Sports drinks offer valuable electrolytes, sugars, and fluids.  Breathing heated mist or steam (vaporizer or shower).  Eating chicken soup or other clear broths, and maintaining good nutrition.  Getting plenty of rest.  Using gargles or lozenges for comfort.  Controlling fevers with ibuprofen or acetaminophen as directed by your caregiver.  Increasing usage of your inhaler if you have asthma. Zinc gel and zinc lozenges, taken in the first 24 hours of the common cold, can shorten the duration and lessen the severity of symptoms. Pain medicines may help with fever, muscle aches, and throat pain. A variety of non-prescription medicines are available to treat congestion and runny nose. Your caregiver   can make recommendations and may suggest nasal or lung inhalers for other symptoms.  HOME CARE INSTRUCTIONS   Only take over-the-counter or prescription medicines for pain, discomfort, or fever as directed by your  caregiver.  Use a warm mist humidifier or inhale steam from a shower to increase air moisture. This may keep secretions moist and make it easier to breathe.  Drink enough water and fluids to keep your urine clear or pale yellow.  Rest as needed.  Return to work when your temperature has returned to normal or as your caregiver advises. You may need to stay home longer to avoid infecting others. You can also use a face mask and careful hand washing to prevent spread of the virus. SEEK MEDICAL CARE IF:   After the first few days, you feel you are getting worse rather than better.  You need your caregiver's advice about medicines to control symptoms.  You develop chills, worsening shortness of breath, or brown or red sputum. These may be signs of pneumonia.  You develop yellow or brown nasal discharge or pain in the face, especially when you bend forward. These may be signs of sinusitis.  You develop a fever, swollen neck glands, pain with swallowing, or white areas in the back of your throat. These may be signs of strep throat. SEEK IMMEDIATE MEDICAL CARE IF:   You have a fever.  You develop severe or persistent headache, ear pain, sinus pain, or chest pain.  You develop wheezing, a prolonged cough, cough up blood, or have a change in your usual mucus (if you have chronic lung disease).  You develop sore muscles or a stiff neck. Document Released: 11/20/2000 Document Revised: 08/19/2011 Document Reviewed: 09/01/2013 Exit[employer] Patient Information 2015 [employer]. This information is not intended to replace advice given to you by your health care provider. Make sure you discuss any questions you have with your health care provider.  

## 2014-10-21 ENCOUNTER — Other Ambulatory Visit: Payer: Self-pay | Admitting: *Deleted

## 2014-10-21 DIAGNOSIS — C9000 Multiple myeloma not having achieved remission: Secondary | ICD-10-CM

## 2014-10-24 ENCOUNTER — Inpatient Hospital Stay: Payer: Medicare Other | Attending: Oncology

## 2014-10-24 ENCOUNTER — Inpatient Hospital Stay (HOSPITAL_BASED_OUTPATIENT_CLINIC_OR_DEPARTMENT_OTHER): Payer: Medicare Other | Admitting: Oncology

## 2014-10-24 VITALS — BP 164/78 | HR 65 | Temp 95.5°F | Wt 102.7 lb

## 2014-10-24 DIAGNOSIS — M87180 Osteonecrosis due to drugs, jaw: Secondary | ICD-10-CM | POA: Diagnosis not present

## 2014-10-24 DIAGNOSIS — I2510 Atherosclerotic heart disease of native coronary artery without angina pectoris: Secondary | ICD-10-CM | POA: Diagnosis not present

## 2014-10-24 DIAGNOSIS — M1990 Unspecified osteoarthritis, unspecified site: Secondary | ICD-10-CM | POA: Diagnosis not present

## 2014-10-24 DIAGNOSIS — I4891 Unspecified atrial fibrillation: Secondary | ICD-10-CM

## 2014-10-24 DIAGNOSIS — Z8673 Personal history of transient ischemic attack (TIA), and cerebral infarction without residual deficits: Secondary | ICD-10-CM | POA: Diagnosis not present

## 2014-10-24 DIAGNOSIS — E785 Hyperlipidemia, unspecified: Secondary | ICD-10-CM | POA: Insufficient documentation

## 2014-10-24 DIAGNOSIS — Z79899 Other long term (current) drug therapy: Secondary | ICD-10-CM

## 2014-10-24 DIAGNOSIS — C9002 Multiple myeloma in relapse: Secondary | ICD-10-CM

## 2014-10-24 DIAGNOSIS — I10 Essential (primary) hypertension: Secondary | ICD-10-CM | POA: Insufficient documentation

## 2014-10-24 DIAGNOSIS — R262 Difficulty in walking, not elsewhere classified: Secondary | ICD-10-CM | POA: Insufficient documentation

## 2014-10-24 DIAGNOSIS — Z7982 Long term (current) use of aspirin: Secondary | ICD-10-CM | POA: Diagnosis not present

## 2014-10-24 DIAGNOSIS — D649 Anemia, unspecified: Secondary | ICD-10-CM | POA: Insufficient documentation

## 2014-10-24 DIAGNOSIS — J069 Acute upper respiratory infection, unspecified: Secondary | ICD-10-CM

## 2014-10-24 DIAGNOSIS — C9000 Multiple myeloma not having achieved remission: Secondary | ICD-10-CM

## 2014-10-24 LAB — COMPREHENSIVE METABOLIC PANEL
ALBUMIN: 3.9 g/dL (ref 3.5–5.0)
ALT: 15 U/L (ref 14–54)
AST: 17 U/L (ref 15–41)
Alkaline Phosphatase: 94 U/L (ref 38–126)
Anion gap: 7 (ref 5–15)
BILIRUBIN TOTAL: 0.5 mg/dL (ref 0.3–1.2)
BUN: 18 mg/dL (ref 6–20)
CALCIUM: 8.9 mg/dL (ref 8.9–10.3)
CHLORIDE: 101 mmol/L (ref 101–111)
CO2: 29 mmol/L (ref 22–32)
CREATININE: 0.78 mg/dL (ref 0.44–1.00)
GFR calc Af Amer: >60 mL/min (ref >60)
GFR calc non Af Amer: >60 mL/min (ref >60)
Glucose, Bld: 85 mg/dL (ref 65–99)
Potassium: 4.3 mmol/L (ref 3.5–5.1)
Sodium: 137 mmol/L (ref 135–145)
Total Protein: 6.9 g/dL (ref 6.5–8.1)

## 2014-10-24 LAB — CBC WITH DIFFERENTIAL/PLATELET
BASOS ABS: 0.1 10*3/uL (ref 0–0.1)
BASOS PCT: 2 %
EOS ABS: 0 10*3/uL (ref 0–0.7)
EOS PCT: 1 %
HEMATOCRIT: 39.5 % (ref 35.0–47.0)
Hemoglobin: 12.7 g/dL (ref 12.0–16.0)
Lymphocytes Relative: 18 %
Lymphs Abs: 0.6 10*3/uL — ABNORMAL LOW (ref 1.0–3.6)
MCH: 29.5 pg (ref 26.0–34.0)
MCHC: 32.2 g/dL (ref 32.0–36.0)
MCV: 91.5 fL (ref 80.0–100.0)
MONO ABS: 0.6 10*3/uL (ref 0.2–0.9)
MONOS PCT: 18 %
Neutro Abs: 2.2 10*3/uL (ref 1.4–6.5)
Neutrophils Relative %: 61 %
Platelets: 298 10*3/uL (ref 150–440)
RBC: 4.32 MIL/uL (ref 3.80–5.20)
RDW: 16 % — AB (ref 11.5–14.5)
WBC: 3.6 10*3/uL (ref 3.6–11.0)

## 2014-10-24 MED ORDER — AZITHROMYCIN 500 MG PO TABS: 500.0000 mg | ORAL_TABLET | Freq: Once | ORAL | Status: DC

## 2014-10-25 LAB — PROTEIN ELECTROPHORESIS, SERUM
A/G Ratio: 1.1 (ref 0.7–2.0)
ALBUMIN ELP: 3.2 g/dL (ref 3.2–5.6)
Alpha-1-Globulin: 0.3 g/dL (ref 0.1–0.4)
Alpha-2-Globulin: 0.7 g/dL (ref 0.4–1.2)
Beta Globulin: 1.1 g/dL (ref 0.6–1.3)
GLOBULIN, TOTAL: 2.9 g/dL (ref 2.0–4.5)
Gamma Globulin: 0.8 g/dL (ref 0.5–1.6)
M-Spike, %: 0.5 g/dL — ABNORMAL HIGH
Total Protein ELP: 6.1 g/dL (ref 6.0–8.5)

## 2014-10-25 LAB — KAPPA/LAMBDA LIGHT CHAINS
KAPPA, LAMDA LIGHT CHAIN RATIO: 1.29 (ref 0.26–1.65)
Kappa free light chain: 14.97 mg/L (ref 3.30–19.40)
LAMDA FREE LIGHT CHAINS: 11.56 mg/L (ref 5.71–26.30)

## 2014-10-26 ENCOUNTER — Telehealth: Payer: Self-pay | Admitting: *Deleted

## 2014-10-26 NOTE — Telephone Encounter (Signed)
[facility] needed to clarify if can dispense #6 of pomalyst capsules since patient already as #8 caps at home. Pt will need total #14 capsules to complete cycle of 14 days on, then 14 days off.

## 2014-10-29 ENCOUNTER — Encounter: Payer: Self-pay | Admitting: Oncology

## 2014-10-29 DIAGNOSIS — C9002 Multiple myeloma in relapse: Secondary | ICD-10-CM

## 2014-10-29 DIAGNOSIS — C9000 Multiple myeloma not having achieved remission: Secondary | ICD-10-CM | POA: Insufficient documentation

## 2014-10-29 HISTORY — DX: Multiple myeloma in relapse: C90.02

## 2014-10-29 NOTE — Progress Notes (Signed)
[place] [facility] Telephone:(336) [phone]  Fax:(336) [place]: 1924/11/03  MR#: [id number]  WEX#:[id number]  Patient Care Team: Derinda Late, MD as PCP - General (Family Medicine)  CHIEF COMPLAINT:  Chief Complaint  Patient presents with  . Follow-up      No flowsheet data found.  INTERVAL HISTORY: 81 year old lady with a history of multiple myeloma.  Taking  POMOLID .  Because of myelosuppression patient had been off treatment.  Had osteonecrosis of jaw bone recent admission in the hospital with pain and swelling on the left side of the neck which is resolved.  Appetite is improved.  Patient had be sort of TIA which is resolved.  Gradually declining performance status with more and more difficulty in ambulation and walking with the help of walker.  REVIEW OF SYSTEMS:   Gen. status: Performance status is 2.  Patient is feeling better.  Appetite is improving.  HEENT: Patient has osteonecrosis of jaw bone.  Swelling on the left side of the neck is improved.  Lungs: Shortness of breath on exertion.  Cardiac: No chest pain no palpitation.  GI: Appetite is getting better no nausea no vomiting no diarrhea or rectal bleeding.  GU: No dysuria or hematuria.  Neurological system: Continues to no problem with maintaining balance.  No tingling.  No numbness.  Lower extremity no swellings.  Skin: No rash.  No skeletal system joint pains continue to bother patient All other systems have been reviewed O significant family history  As per HPI. Otherwise, a complete review of systems is negatve.  PAST MEDICAL HISTORY: Past Medical History  Diagnosis Date  . Multiple myeloma   . Osteonecrosis of jaw     left  . Anxiety   . Coronary arteriosclerosis   . Hypertension   . Hyperlipidemia   . Arthritis   . Atrial fibrillation   . Multiple myeloma in relapse 10/29/2014    PAST SURGICAL HISTORY: Past Surgical History  Procedure Laterality Date  . Appendectomy    .  Abdominal hysterectomy    . Total knee revision Left   . 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     ADVANCED DIRECTIVES:    HEALTH MAINTENANCE: History  Substance Use Topics  . Smoking status: Never Smoker   . Smokeless tobacco: Never Used  . Alcohol Use: 1.2 oz/week    2 Glasses of wine per week      Allergies  Allergen Reactions  . Biaxin [Clarithromycin] Diarrhea and Nausea And Vomiting  . Demerol [Meperidine] Nausea And Vomiting  . Hydrocodone Nausea And Vomiting  . Sulfa Antibiotics Hives  . Tramadol Diarrhea and Nausea And Vomiting  . Ultracet [Tramadol-Acetaminophen] Other (See Comments)    Current Outpatient Prescriptions  Medication Sig Dispense Refill  . aspirin 81 MG chewable tablet Chew 81 mg by mouth daily.    . fluticasone (FLONASE) 50 MCG/ACT nasal spray Place 1 spray into both nostrils daily. 16 g 0  . ibuprofen (ADVIL,MOTRIN) 800 MG tablet     .  ondansetron (ZOFRAN) 4 MG tablet Take 4 mg by mouth every 8 (eight) hours as needed for nausea or vomiting.    . pomalidomide (POMALYST) 4 MG capsule Take 4 mg by mouth daily. Take with water on days 1-21. Repeat every 28 days.    Marland Kitchen azithromycin (ZITHROMAX) 500 MG tablet Take 1 tablet (500 mg total) by mouth once. 5 tablet 0  . Biotin 1 MG CAPS Take by mouth.     No current facility-administered medications for this visit.    OBJECTIVE:  Filed Vitals:   10/24/14 1221  BP: 164/78  Pulse: 65  Temp: 95.5 F (35.3 C)     Body mass index is 18.79 kg/(m^2).    ECOG FS:  2  PHYSICAL EXAM: GENERAL  status: Performance status is 2  Patient has not lost significant weight Walking with the help of walker. HEENT: No evidence of stomatitis.  No swelling on the left side of the neck.  Osteonecrosis of jaw bone is stable.  No evidence of infection.   Sclera and conjunctivae :: No jaundice.   pale looking . Lungs: Air  entry equal on both sides.  No rhonchi.  No rales.  Cardiac: Heart sounds are normal.  No pericardial rub.  No murmur. Lymphatic system: Cervical, axillary, inguinal, lymph nodes not palpable GI: Abdomen is soft.  No ascites.  Liver spleen not palpable.  No tenderness.  Bowel sounds are within normal limit Lower extremity: No edema Neurologically, the patient was awake, alert, and oriented to person, place and time. There were no obvious focal neurologic abnormalities.ct No evidence of peripheral neuropathy. Examination of the skin revealed no evidence of significant rashes, suspicious appearing nevi or other concerning lesions.     LAB RESULTS:  Appointment on 10/24/2014  Component Date Value Ref Range Status  . WBC 10/24/2014 3.6  3.6 - 11.0 K/uL Final  . RBC 10/24/2014 4.32  3.80 - 5.20 MIL/uL Final  . Hemoglobin 10/24/2014 12.7  12.0 - 16.0 g/dL Final  . HCT 10/24/2014 39.5  35.0 - 47.0 % Final  . MCV 10/24/2014 91.5  80.0 - 100.0 fL Final  . MCH 10/24/2014 29.5  26.0 - 34.0 pg Final  . MCHC 10/24/2014 32.2  32.0 - 36.0 g/dL Final  . RDW 10/24/2014 16.0* 11.5 - 14.5 % Final  . Platelets 10/24/2014 298  150 - 440 K/uL Final  . Neutrophils Relative % 10/24/2014 61   Final  . Neutro Abs 10/24/2014 2.2  1.4 - 6.5 K/uL Final  . Lymphocytes Relative 10/24/2014 18   Final  . Lymphs Abs 10/24/2014 0.6* 1.0 - 3.6 K/uL Final  . Monocytes Relative 10/24/2014 18   Final  . Monocytes Absolute 10/24/2014 0.6  0.2 - 0.9 K/uL Final  . Eosinophils Relative 10/24/2014 1   Final  . Eosinophils Absolute 10/24/2014 0.0  0 - 0.7 K/uL Final  . Basophils  Relative 10/24/2014 2   Final  . Basophils Absolute 10/24/2014 0.1  0 - 0.1 K/uL Final  . Sodium 10/24/2014 137  135 - 145 mmol/L Final  . Potassium 10/24/2014 4.3  3.5 - 5.1 mmol/L Final  . Chloride 10/24/2014 101  101 - 111 mmol/L Final  . CO2 10/24/2014 29  22 - 32 mmol/L Final  . Glucose, Bld 10/24/2014 85  65 - 99 mg/dL Final  . BUN 10/24/2014 18  6 - 20 mg/dL Final  . Creatinine, Ser 10/24/2014 0.78  0.44 - 1.00 mg/dL Final  . Calcium 10/24/2014 8.9  8.9 -  10.3 mg/dL Final  . Total Protein 10/24/2014 6.9  6.5 - 8.1 g/dL Final  . Albumin 10/24/2014 3.9  3.5 - 5.0 g/dL Final  . AST 10/24/2014 17  15 - 41 U/L Final  . ALT 10/24/2014 15  14 - 54 U/L Final  . Alkaline Phosphatase 10/24/2014 94  38 - 126 U/L Final  . Total Bilirubin 10/24/2014 0.5  0.3 - 1.2 mg/dL Final  . GFR calc non Af Amer 10/24/2014 >60  >60 mL/min Final  . GFR calc Af Amer 10/24/2014 >60  >60 mL/min Final   Comment: (NOTE) The eGFR has been calculated using the CKD EPI equation. This calculation has not been validated in all clinical situations. eGFR's persistently <60 mL/min signify possible Chronic Kidney Disease.   . Anion gap 10/24/2014 7  5 - 15 Final  . Kappa free light chain 10/24/2014 14.97  3.30 - 19.40 mg/L Final  . Lamda free light chains 10/24/2014 11.56  5.71 - 26.30 mg/L Final  . Kappa, lamda light chain ratio 10/24/2014 1.29  0.26 - 1.65 Final   Comment: (NOTE) Performed At: [facility] [place], [place] [id number] Lindon Romp MD YP:[id number]   . Total Protein ELP 10/24/2014 6.1  6.0 - 8.5 g/dL Final  . Albumin ELP 10/24/2014 3.2  3.2 - 5.6 g/dL Final  . Alpha-1-Globulin 10/24/2014 0.3  0.1 - 0.4 g/dL Final  . Alpha-2-Globulin 10/24/2014 0.7  0.4 - 1.2 g/dL Final  . Beta Globulin 10/24/2014 1.1  0.6 - 1.3 g/dL Final  . Gamma Globulin 10/24/2014 0.8  0.5 - 1.6 g/dL Final  . M-Spike, % 10/24/2014 0.5* Not Observed g/dL Final  . SPE Interp. 10/24/2014 Comment    Final   Comment: (NOTE) The SPE pattern demonstrates a single peak (M-spike) in the gamma region which may represent monoclonal protein. This peak may also be caused by circulating immune complexes, cryoglobulins, C-reactive protein, fibrinogen or hemolysis.  If clinically indicated, the presence of a monoclonal gammopathy may be confirmed by immuno- fixation, as well as an evaluation of the urine for the presence of Bence-Jones protein. Performed At: [facility] [place], [place] [id number] Lindon Romp MD AS:[id number]   . Comment 10/24/2014 Comment   Final   Comment: (NOTE) Protein electrophoresis scan will follow via computer, mail, or courier delivery.   Marland Kitchen GLOBULIN, TOTAL 10/24/2014 2.9  2.0 - 4.5 g/dL Corrected  . A/G Ratio 10/24/2014 1.1  0.7 - 2.0 Corrected      STUDIES: No results found.  ASSESSMENT: Multiple myeloma in relapse.  Patient had a myelosuppression secondary to POMOLIDOMIDE .  Myelosuppression is resolved. SIEP and light chain has been evaluated. Osteonecrosis of jaw bone stable  MEDICAL DECISION MAKING:  All lab data has been reviewed. Myelosuppression is just also proceed with  POMOLIDOMIDE 4 mg 2 weeks on and 2 week oOFF modified protocol. Osteonecrosis of jaw bone stable SIEP and light chain so stable disease Social issues and difficulty walking has been addressed Total duration of visit was 45 minutes.  50% or more time was spent in counseling patient and family regarding prognosis and options of treatment and available resources  Patient expressed understanding and was in agreement with this plan. She also understands that She can call clinic at any time with any questions, concerns, or complaints.    No matching staging information was found for the patient.  Forest Gleason, MD   10/29/2014 12:02 PM

## 2014-11-29 ENCOUNTER — Telehealth: Payer: Self-pay | Admitting: *Deleted

## 2014-11-29 NOTE — Telephone Encounter (Signed)
Hold pomalyst at this time until sees Dr. Oliva Bustard at next appt. We will let pt know at next appt if can restart pomalyst depending on blood counts.

## 2014-11-29 NOTE — Telephone Encounter (Signed)
Called patient to inform her that she should not begin her Pomalyst until she sees Dr. Oliva Bustard on Monday, June 27th and has her labs drawn. Patient verbalized understanding.

## 2014-12-05 ENCOUNTER — Inpatient Hospital Stay: Payer: Medicare Other

## 2014-12-05 ENCOUNTER — Inpatient Hospital Stay: Payer: Medicare Other | Attending: Oncology | Admitting: Oncology

## 2014-12-05 VITALS — BP 159/82 | HR 74 | Temp 96.6°F | Wt 101.4 lb

## 2014-12-05 DIAGNOSIS — M810 Age-related osteoporosis without current pathological fracture: Secondary | ICD-10-CM

## 2014-12-05 DIAGNOSIS — C9002 Multiple myeloma in relapse: Secondary | ICD-10-CM

## 2014-12-05 DIAGNOSIS — I4891 Unspecified atrial fibrillation: Secondary | ICD-10-CM

## 2014-12-05 DIAGNOSIS — I10 Essential (primary) hypertension: Secondary | ICD-10-CM

## 2014-12-05 DIAGNOSIS — M87180 Osteonecrosis due to drugs, jaw: Secondary | ICD-10-CM | POA: Diagnosis not present

## 2014-12-05 DIAGNOSIS — E785 Hyperlipidemia, unspecified: Secondary | ICD-10-CM

## 2014-12-05 DIAGNOSIS — F418 Other specified anxiety disorders: Secondary | ICD-10-CM | POA: Diagnosis not present

## 2014-12-05 DIAGNOSIS — Z7982 Long term (current) use of aspirin: Secondary | ICD-10-CM

## 2014-12-05 DIAGNOSIS — Z79899 Other long term (current) drug therapy: Secondary | ICD-10-CM | POA: Diagnosis not present

## 2014-12-05 DIAGNOSIS — C9000 Multiple myeloma not having achieved remission: Secondary | ICD-10-CM

## 2014-12-05 DIAGNOSIS — I2510 Atherosclerotic heart disease of native coronary artery without angina pectoris: Secondary | ICD-10-CM | POA: Diagnosis not present

## 2014-12-05 LAB — COMPREHENSIVE METABOLIC PANEL
ALK PHOS: 76 U/L (ref 38–126)
ALT: 12 U/L — AB (ref 14–54)
AST: 17 U/L (ref 15–41)
Albumin: 4 g/dL (ref 3.5–5.0)
Anion gap: 6 (ref 5–15)
BILIRUBIN TOTAL: 0.6 mg/dL (ref 0.3–1.2)
BUN: 17 mg/dL (ref 6–20)
CO2: 27 mmol/L (ref 22–32)
Calcium: 8.4 mg/dL — ABNORMAL LOW (ref 8.9–10.3)
Chloride: 100 mmol/L — ABNORMAL LOW (ref 101–111)
Creatinine, Ser: 0.72 mg/dL (ref 0.44–1.00)
GFR calc non Af Amer: >60 mL/min (ref >60)
GLUCOSE: 85 mg/dL (ref 65–99)
POTASSIUM: 3.8 mmol/L (ref 3.5–5.1)
SODIUM: 133 mmol/L — AB (ref 135–145)
Total Protein: 7.2 g/dL (ref 6.5–8.1)

## 2014-12-05 LAB — CBC WITH DIFFERENTIAL/PLATELET
BASOS ABS: 0.1 10*3/uL (ref 0–0.1)
Basophils Relative: 4 %
EOS ABS: 0 10*3/uL (ref 0–0.7)
EOS PCT: 1 %
HEMATOCRIT: 41.2 % (ref 35.0–47.0)
Hemoglobin: 13.3 g/dL (ref 12.0–16.0)
LYMPHS PCT: 23 %
Lymphs Abs: 0.8 10*3/uL — ABNORMAL LOW (ref 1.0–3.6)
MCH: 29.7 pg (ref 26.0–34.0)
MCHC: 32.4 g/dL (ref 32.0–36.0)
MCV: 91.7 fL (ref 80.0–100.0)
MONO ABS: 0.4 10*3/uL (ref 0.2–0.9)
Monocytes Relative: 11 %
Neutro Abs: 2.2 10*3/uL (ref 1.4–6.5)
Neutrophils Relative %: 61 %
PLATELETS: 271 10*3/uL (ref 150–440)
RBC: 4.49 MIL/uL (ref 3.80–5.20)
RDW: 15.8 % — AB (ref 11.5–14.5)
WBC: 3.6 10*3/uL (ref 3.6–11.0)

## 2014-12-05 NOTE — Progress Notes (Signed)
Patient does have living will. Never smoked. 

## 2014-12-06 ENCOUNTER — Other Ambulatory Visit: Payer: Self-pay | Admitting: *Deleted

## 2014-12-06 ENCOUNTER — Encounter: Payer: Self-pay | Admitting: Oncology

## 2014-12-06 DIAGNOSIS — C9002 Multiple myeloma in relapse: Secondary | ICD-10-CM

## 2014-12-06 LAB — KAPPA/LAMBDA LIGHT CHAINS
Kappa free light chain: 18.31 mg/L (ref 3.30–19.40)
Kappa, lambda light chain ratio: 1.56 (ref 0.26–1.65)
Lambda free light chains: 11.72 mg/L (ref 5.71–26.30)

## 2014-12-06 LAB — PROTEIN ELECTROPHORESIS, SERUM
A/G Ratio: 1.3 (ref 0.7–1.7)
ALBUMIN ELP: 3.6 g/dL (ref 2.9–4.4)
ALPHA-1-GLOBULIN: 0.2 g/dL (ref 0.0–0.4)
Alpha-2-Globulin: 0.6 g/dL (ref 0.4–1.0)
BETA GLOBULIN: 1 g/dL (ref 0.7–1.3)
Gamma Globulin: 0.9 g/dL (ref 0.4–1.8)
Globulin, Total: 2.7 g/dL (ref 2.2–3.9)
M-Spike, %: 0.6 g/dL — ABNORMAL HIGH
TOTAL PROTEIN ELP: 6.3 g/dL (ref 6.0–8.5)

## 2014-12-06 MED ORDER — POMALIDOMIDE 4 MG PO CAPS: 4.0000 mg | ORAL_CAPSULE | Freq: Every day | ORAL | Status: DC

## 2014-12-06 MED ORDER — DEXAMETHASONE 4 MG PO TABS: 20.0000 mg | ORAL_TABLET | Freq: Every day | ORAL | Status: DC

## 2014-12-06 NOTE — Progress Notes (Signed)
[place] @ [facility] [phone]  Fax:([phone]) [place] OB: 1924/10/13  MR#: [id number]  DGL#:[id number]  Patient Care Team: Derinda Late, MD as PCP - General (Family Medicine)  CHIEF COMPLAINT:  Chief Complaint  Patient presents with  . Follow-up      No flowsheet data found.  INTERVAL HISTORY: 81 year old lady with a history of multiple myeloma.  Taking  POMOLID .  Because of myelosuppression patient had been off treatment.  Had osteonecrosis of jaw bone recent admission in the hospital with pain and swelling on the left side of the neck which is resolved.  Appetite is improved.  Patient had be sort of TIA which is resolved.  Gradually declining performance status with more and more difficulty in ambulation and walking with the help of walker. December 05, 2014 Patient is here for further follow-up regarding multiple myeloma.  Gradually getting better.  Left jaw pain has improved.  No chills.  No fever.  Bony pensive improved.  Patient is here for ongoing evaluation and continuation of treatment regarding multiple myeloma  REVIEW OF SYSTEMS:   Gen. status: Performance status is 2.  Patient is feeling better.  Appetite is improving.  HEENT: Patient has osteonecrosis of jaw bone.  Swelling on the left side of the neck is improved.  Lungs: Shortness of breath on exertion.  Cardiac: No chest pain no palpitation.  GI: Appetite is getting better no nausea no vomiting no diarrhea or rectal bleeding.  GU: No dysuria or hematuria.  Neurological system: Continues to no problem with maintaining balance.  No tingling.  No numbness.  Lower extremity no swellings.  Skin: No rash.  No skeletal system joint pains continue to bother patient perstatus is improving. Swelling in the left jaw has been All other systems have been reviewed O significant family history  As per HPI. Otherwise, a complete review of systems is negatve.  PAST MEDICAL HISTORY: Past Medical History   Diagnosis Date  . Multiple myeloma   . Osteonecrosis of jaw     left  . Anxiety   . Coronary arteriosclerosis   . Hypertension   . Hyperlipidemia   . Arthritis   . Atrial fibrillation   . Multiple myeloma in relapse 10/29/2014    PAST SURGICAL HISTORY: Past Surgical History  Procedure Laterality Date  . Appendectomy    . Abdominal hysterectomy    . Total knee revision Left   . 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     ADVANCED DIRECTIVES:    HEALTH MAINTENANCE: History  Substance Use Topics  . Smoking status: Never Smoker   . Smokeless tobacco: Never Used  . Alcohol Use: 1.2 oz/week    2 Glasses of wine per week      Allergies  Allergen Reactions  . Biaxin [Clarithromycin] Diarrhea and Nausea And Vomiting  . Demerol [Meperidine] Nausea And Vomiting  . Hydrocodone Nausea And Vomiting  . Sulfa Antibiotics Hives  . Tramadol Diarrhea and Nausea And Vomiting  .  Ultracet [Tramadol-Acetaminophen] Other (See Comments)    Current Outpatient Prescriptions  Medication Sig Dispense Refill  . aspirin 81 MG chewable tablet Chew 81 mg by mouth daily.    . Biotin 1 MG CAPS Take by mouth.    Marland Kitchen ibuprofen (ADVIL,MOTRIN) 800 MG tablet     . ondansetron (ZOFRAN) 4 MG tablet Take 4 mg by mouth every 8 (eight) hours as needed for nausea or vomiting.    Marland Kitchen azithromycin (ZITHROMAX) 500 MG tablet Take 1 tablet (500 mg total) by mouth once. (Patient not taking: Reported on 12/05/2014) 5 tablet 0  . dexamethasone (DECADRON) 4 MG tablet Take 5 tablets (20  mg total) by mouth daily. Take on day 1, 8, 15    . fluticasone (FLONASE) 50 MCG/ACT nasal spray Place 1 spray into both nostrils daily. (Patient not taking: Reported on 12/05/2014) 16 g 0  . pomalidomide (POMALYST) 4 MG capsule Take 1 capsule (4 mg total) by mouth daily. Take with water on days 1-14. Then 14 days off.     No current facility-administered medications for this visit.    OBJECTIVE:  Filed Vitals:   12/05/14 1225  BP: 159/82  Pulse: 74  Temp: 96.6 F (35.9 C)     Body mass index is 18.54 kg/(m^2).    ECOG FS: 2  PHYSICAL EXAM: GENERAL  status: Performance status is 2  Patient has not lost significant weight Walking with the help of walker. HEENT: No evidence of stomatitis.  No swelling on the left side of the neck.  Osteonecrosis of jaw bone is stable.  No evidence of infection.   Sclera and conjunctivae :: No jaundice.   pale looking . Lungs: Air  entry equal on both sides.  No rhonchi.  No rales.  Cardiac: Heart sounds are normal.  No pericardial rub.  No murmur. Lymphatic system: Cervical, axillary, inguinal, lymph nodes not palpable GI: Abdomen is soft.  No ascites.  Liver spleen not palpable.  No tenderness.  Bowel sounds are within normal limit Lower extremity: No edema Neurologically, the patient was awake, alert, and oriented to person, place and time. There were no obvious focal neurologic abnormalities.ct No evidence of peripheral neuropathy. Examination of the skin revealed no evidence of significant rashes, suspicious appearing nevi or other concerning lesions.     LAB RESULTS:  Appointment on 12/05/2014  Component Date Value Ref Range Status  . WBC 12/05/2014 3.6  3.6 - 11.0 K/uL Final  . RBC 12/05/2014 4.49  3.80 - 5.20 MIL/uL Final  . Hemoglobin 12/05/2014 13.3  12.0 - 16.0 g/dL Final  . HCT 12/05/2014 41.2  35.0 - 47.0 % Final  . MCV 12/05/2014 91.7  80.0 - 100.0 fL Final  . MCH 12/05/2014 29.7  26.0 - 34.0 pg Final  . MCHC 12/05/2014 32.4   32.0 - 36.0 g/dL Final  . RDW 12/05/2014 15.8* 11.5 - 14.5 % Final  . Platelets 12/05/2014 271  150 - 440 K/uL Final  . Neutrophils Relative % 12/05/2014 61   Final  . Neutro Abs 12/05/2014 2.2  1.4 - 6.5 K/uL Final  . Lymphocytes Relative 12/05/2014 23   Final  . Lymphs Abs 12/05/2014 0.8* 1.0 - 3.6 K/uL Final  . Monocytes Relative 12/05/2014 11   Final  . Monocytes Absolute 12/05/2014 0.4  0.2 - 0.9 K/uL Final  . Eosinophils Relative 12/05/2014 1   Final  . Eosinophils Absolute 12/05/2014 0.0  0 - 0.7 K/uL Final  . Basophils Relative 12/05/2014 4  Final  . Basophils Absolute 12/05/2014 0.1  0 - 0.1 K/uL Final  . Sodium 12/05/2014 133* 135 - 145 mmol/L Final  . Potassium 12/05/2014 3.8  3.5 - 5.1 mmol/L Final  . Chloride 12/05/2014 100* 101 - 111 mmol/L Final  . CO2 12/05/2014 27  22 - 32 mmol/L Final  . Glucose, Bld 12/05/2014 85  65 - 99 mg/dL Final  . BUN 12/05/2014 17  6 - 20 mg/dL Final  . Creatinine, Ser 12/05/2014 0.72  0.44 - 1.00 mg/dL Final  . Calcium 12/05/2014 8.4* 8.9 - 10.3 mg/dL Final  . Total Protein 12/05/2014 7.2  6.5 - 8.1 g/dL Final  . Albumin 12/05/2014 4.0  3.5 - 5.0 g/dL Final  . AST 12/05/2014 17  15 - 41 U/L Final  . ALT 12/05/2014 12* 14 - 54 U/L Final  . Alkaline Phosphatase 12/05/2014 76  38 - 126 U/L Final  . Total Bilirubin 12/05/2014 0.6  0.3 - 1.2 mg/dL Final  . GFR calc non Af Amer 12/05/2014 >60  >60 mL/min Final  . GFR calc Af Amer 12/05/2014 >60  >60 mL/min Final   Comment: (NOTE) The eGFR has been calculated using the CKD EPI equation. This calculation has not been validated in all clinical situations. eGFR's persistently <60 mL/min signify possible Chronic Kidney Disease.   . Anion gap 12/05/2014 6  5 - 15 Final      STUDIES: No results found.  ASSESSMENT: Multiple myeloma in relapse.  Patient had a myelosuppression secondary to POMOLIDOMIDE .  Myelosuppression is resolved. We will proceed with pomolidomide and Decadron Evaluate  with SIEP and light chain  MEDICAL DECISION MAKING:  All lab data has been reviewed. Myelosuppression is just also proceed with  POMOLIDOMIDE 4 mg 2 weeks on and 2 week oOFF modified protocol. Osteonecrosis of jaw bone stable SIEP and light chain so stable disease Social issues and difficulty walking has been addressed Total duration of visit was 45 minutes.  50% or more time was spent in counseling patient and family regarding prognosis and options of treatment and available resources  Patient expressed understanding and was in agreement with this plan. She also understands that She can call clinic at any time with any questions, concerns, or complaints.    No matching staging information was found for the patient.  Forest Gleason, MD   12/06/2014 8:32 AM

## 2014-12-08 ENCOUNTER — Telehealth: Payer: Self-pay | Admitting: *Deleted

## 2014-12-08 DIAGNOSIS — C9002 Multiple myeloma in relapse: Secondary | ICD-10-CM

## 2014-12-08 MED ORDER — DEXAMETHASONE 4 MG PO TABS: 20.0000 mg | ORAL_TABLET | ORAL | Status: DC

## 2014-12-08 NOTE — Telephone Encounter (Signed)
Pt informed that rx sent to [facility]

## 2014-12-08 NOTE — Telephone Encounter (Signed)
Decadron refill escribed to [facility] in [place]

## 2014-12-23 ENCOUNTER — Telehealth: Payer: Self-pay | Admitting: *Deleted

## 2014-12-26 ENCOUNTER — Other Ambulatory Visit: Payer: Self-pay | Admitting: *Deleted

## 2014-12-26 DIAGNOSIS — C9002 Multiple myeloma in relapse: Secondary | ICD-10-CM

## 2014-12-27 ENCOUNTER — Telehealth: Payer: Self-pay | Admitting: *Deleted

## 2014-12-27 ENCOUNTER — Inpatient Hospital Stay: Payer: Medicare Other | Attending: Oncology

## 2014-12-27 DIAGNOSIS — M87180 Osteonecrosis due to drugs, jaw: Secondary | ICD-10-CM | POA: Insufficient documentation

## 2014-12-27 DIAGNOSIS — Z9221 Personal history of antineoplastic chemotherapy: Secondary | ICD-10-CM | POA: Insufficient documentation

## 2014-12-27 DIAGNOSIS — Z7982 Long term (current) use of aspirin: Secondary | ICD-10-CM | POA: Insufficient documentation

## 2014-12-27 DIAGNOSIS — I2510 Atherosclerotic heart disease of native coronary artery without angina pectoris: Secondary | ICD-10-CM | POA: Diagnosis not present

## 2014-12-27 DIAGNOSIS — I4891 Unspecified atrial fibrillation: Secondary | ICD-10-CM | POA: Diagnosis not present

## 2014-12-27 DIAGNOSIS — D759 Disease of blood and blood-forming organs, unspecified: Secondary | ICD-10-CM | POA: Insufficient documentation

## 2014-12-27 DIAGNOSIS — M1990 Unspecified osteoarthritis, unspecified site: Secondary | ICD-10-CM | POA: Diagnosis not present

## 2014-12-27 DIAGNOSIS — Z79899 Other long term (current) drug therapy: Secondary | ICD-10-CM | POA: Insufficient documentation

## 2014-12-27 DIAGNOSIS — C9002 Multiple myeloma in relapse: Secondary | ICD-10-CM | POA: Insufficient documentation

## 2014-12-27 DIAGNOSIS — R0609 Other forms of dyspnea: Secondary | ICD-10-CM | POA: Insufficient documentation

## 2014-12-27 LAB — CBC WITH DIFFERENTIAL/PLATELET
BASOS ABS: 0 10*3/uL (ref 0–0.1)
Basophils Relative: 1 %
Eosinophils Absolute: 0.1 10*3/uL (ref 0–0.7)
Eosinophils Relative: 2 %
HCT: 40 % (ref 35.0–47.0)
Hemoglobin: 12.8 g/dL (ref 12.0–16.0)
LYMPHS PCT: 30 %
Lymphs Abs: 0.8 10*3/uL — ABNORMAL LOW (ref 1.0–3.6)
MCH: 30 pg (ref 26.0–34.0)
MCHC: 32.1 g/dL (ref 32.0–36.0)
MCV: 93.4 fL (ref 80.0–100.0)
MONO ABS: 0.7 10*3/uL (ref 0.2–0.9)
MONOS PCT: 27 %
NEUTROS ABS: 1 10*3/uL — AB (ref 1.4–6.5)
Neutrophils Relative %: 40 %
Platelets: 195 10*3/uL (ref 150–440)
RBC: 4.28 MIL/uL (ref 3.80–5.20)
RDW: 16.5 % — ABNORMAL HIGH (ref 11.5–14.5)
WBC: 2.5 10*3/uL — AB (ref 3.6–11.0)

## 2014-12-27 NOTE — Telephone Encounter (Signed)
[facility] handling this per Dr Oliva Bustard

## 2014-12-27 NOTE — Telephone Encounter (Signed)
Notified patient that she can have tooth extracted.   Labs have been faxed to Dr. Delice Bison, [place].

## 2014-12-30 ENCOUNTER — Telehealth: Payer: Self-pay | Admitting: *Deleted

## 2014-12-30 NOTE — Telephone Encounter (Signed)
Restart date for Pomalyst is 01-04-15.  Needs refill per [place]. Call if questions: [phone]

## 2014-12-30 NOTE — Telephone Encounter (Signed)
Refill for pomalyst sent to [facility] this morning.

## 2015-01-02 ENCOUNTER — Inpatient Hospital Stay (HOSPITAL_BASED_OUTPATIENT_CLINIC_OR_DEPARTMENT_OTHER): Payer: Medicare Other | Admitting: Oncology

## 2015-01-02 ENCOUNTER — Other Ambulatory Visit: Payer: PRIVATE HEALTH INSURANCE

## 2015-01-02 ENCOUNTER — Inpatient Hospital Stay: Payer: Medicare Other

## 2015-01-02 ENCOUNTER — Ambulatory Visit: Payer: PRIVATE HEALTH INSURANCE | Admitting: Oncology

## 2015-01-02 VITALS — BP 152/81 | HR 71 | Temp 97.0°F | Wt 80.9 lb

## 2015-01-02 DIAGNOSIS — R0609 Other forms of dyspnea: Secondary | ICD-10-CM

## 2015-01-02 DIAGNOSIS — Z9221 Personal history of antineoplastic chemotherapy: Secondary | ICD-10-CM | POA: Diagnosis not present

## 2015-01-02 DIAGNOSIS — M87180 Osteonecrosis due to drugs, jaw: Secondary | ICD-10-CM

## 2015-01-02 DIAGNOSIS — C9002 Multiple myeloma in relapse: Secondary | ICD-10-CM

## 2015-01-02 DIAGNOSIS — D759 Disease of blood and blood-forming organs, unspecified: Secondary | ICD-10-CM

## 2015-01-02 DIAGNOSIS — I2510 Atherosclerotic heart disease of native coronary artery without angina pectoris: Secondary | ICD-10-CM

## 2015-01-02 DIAGNOSIS — Z7982 Long term (current) use of aspirin: Secondary | ICD-10-CM

## 2015-01-02 DIAGNOSIS — M1990 Unspecified osteoarthritis, unspecified site: Secondary | ICD-10-CM

## 2015-01-02 DIAGNOSIS — I4891 Unspecified atrial fibrillation: Secondary | ICD-10-CM

## 2015-01-02 DIAGNOSIS — Z79899 Other long term (current) drug therapy: Secondary | ICD-10-CM

## 2015-01-02 LAB — CBC WITH DIFFERENTIAL/PLATELET
Basophils Absolute: 0 10*3/uL (ref 0–0.1)
Basophils Relative: 2 %
Eosinophils Absolute: 0 10*3/uL (ref 0–0.7)
Eosinophils Relative: 1 %
HCT: 39.3 % (ref 35.0–47.0)
HEMOGLOBIN: 12.8 g/dL (ref 12.0–16.0)
LYMPHS ABS: 0.6 10*3/uL — AB (ref 1.0–3.6)
Lymphocytes Relative: 29 %
MCH: 30.2 pg (ref 26.0–34.0)
MCHC: 32.6 g/dL (ref 32.0–36.0)
MCV: 92.8 fL (ref 80.0–100.0)
MONO ABS: 0.8 10*3/uL (ref 0.2–0.9)
Monocytes Relative: 41 %
NEUTROS ABS: 0.5 10*3/uL — AB (ref 1.4–6.5)
Neutrophils Relative %: 27 %
PLATELETS: 298 10*3/uL (ref 150–440)
RBC: 4.23 MIL/uL (ref 3.80–5.20)
RDW: 16.4 % — AB (ref 11.5–14.5)
WBC: 1.9 10*3/uL — AB (ref 3.6–11.0)

## 2015-01-02 LAB — COMPREHENSIVE METABOLIC PANEL
ALBUMIN: 3.8 g/dL (ref 3.5–5.0)
ALK PHOS: 74 U/L (ref 38–126)
ALT: 10 U/L — ABNORMAL LOW (ref 14–54)
ANION GAP: 7 (ref 5–15)
AST: 15 U/L (ref 15–41)
BUN: 22 mg/dL — ABNORMAL HIGH (ref 6–20)
CALCIUM: 8.5 mg/dL — AB (ref 8.9–10.3)
CO2: 26 mmol/L (ref 22–32)
Chloride: 105 mmol/L (ref 101–111)
Creatinine, Ser: 0.85 mg/dL (ref 0.44–1.00)
GFR calc Af Amer: >60 mL/min (ref >60)
GFR, EST NON AFRICAN AMERICAN: 59 mL/min — AB (ref >60)
Glucose, Bld: 97 mg/dL (ref 65–99)
Potassium: 3.8 mmol/L (ref 3.5–5.1)
Sodium: 138 mmol/L (ref 135–145)
Total Bilirubin: 0.7 mg/dL (ref 0.3–1.2)
Total Protein: 6.9 g/dL (ref 6.5–8.1)

## 2015-01-02 NOTE — Progress Notes (Signed)
Patient does have living will. Never smoked. 

## 2015-01-03 ENCOUNTER — Other Ambulatory Visit: Payer: Self-pay | Admitting: *Deleted

## 2015-01-03 ENCOUNTER — Telehealth: Payer: Self-pay | Admitting: *Deleted

## 2015-01-03 DIAGNOSIS — J069 Acute upper respiratory infection, unspecified: Secondary | ICD-10-CM

## 2015-01-03 LAB — PROTEIN ELECTROPHORESIS, SERUM
A/G Ratio: 1.3 (ref 0.7–1.7)
ALPHA-1-GLOBULIN: 0.2 g/dL (ref 0.0–0.4)
Albumin ELP: 3.5 g/dL (ref 2.9–4.4)
Alpha-2-Globulin: 0.6 g/dL (ref 0.4–1.0)
BETA GLOBULIN: 1 g/dL (ref 0.7–1.3)
GLOBULIN, TOTAL: 2.8 g/dL (ref 2.2–3.9)
Gamma Globulin: 0.9 g/dL (ref 0.4–1.8)
M-SPIKE, %: 0.6 g/dL — AB
Total Protein ELP: 6.3 g/dL (ref 6.0–8.5)

## 2015-01-03 LAB — KAPPA/LAMBDA LIGHT CHAINS
KAPPA, LAMDA LIGHT CHAIN RATIO: 1.43 (ref 0.26–1.65)
Kappa free light chain: 16.04 mg/L (ref 3.30–19.40)
LAMDA FREE LIGHT CHAINS: 11.21 mg/L (ref 5.71–26.30)

## 2015-01-03 MED ORDER — IBUPROFEN 800 MG PO TABS: 800.0000 mg | ORAL_TABLET | Freq: Three times a day (TID) | ORAL | Status: DC | PRN

## 2015-01-03 NOTE — Telephone Encounter (Signed)
Refill has been sent last week to [facility]. Pt will need to call [facility] to deliver medication.

## 2015-01-03 NOTE — Telephone Encounter (Signed)
Is requesting a refill on Ibuprofen; has already been submitted.Marland KitchenMarland Kitchen

## 2015-01-03 NOTE — Telephone Encounter (Signed)
Pt states that she needs a refill on her Pomalyst ASAP.Marland KitchenMarland Kitchen

## 2015-01-08 ENCOUNTER — Encounter: Payer: Self-pay | Admitting: Oncology

## 2015-01-08 NOTE — Progress Notes (Signed)
[facility] [phone]  [phone]     [name] OB: 10/08/1924  MR#: [id number]  CSN#:[id number]  Patient Care Team: Marcus Babaoff, MD as PCP - General (Family Medicine)  CHIEF COMPLAINT:  Chief Complaint  Patient presents with  . Follow-up      No flowsheet data found.  INTERVAL HISTORY: 81-year-old lady with a history of multiple myeloma.  Taking  POMOLID .  Because of myelosuppression patient had been off treatment.  Had osteonecrosis of jaw bone recent admission in the hospital with pain and swelling on the left side of the neck which is resolved.  Appetite is improved.  Patient had be sort of TIA which is resolved.  Gradually declining performance status with more and more difficulty in ambulation and walking with the help of walker. December 05, 2014 Patient is here for further follow-up regarding multiple myeloma.  Gradually getting better.  Left jaw pain has improved.  No chills.  No fever.  Bony pensive improved.  Patient is here for ongoing evaluation and continuation of treatment regarding multiple myeloma In July, 2016 Patient is on slightly reduced dose of  pomolidomide.  No chills and fever.  Patient also had a recent tooth extraction which is healing well.  Osteonecrosis of jaw bone has been stabilized.  Patient Gen. been stabilized Year for further follow-up and treatment regarding relapse of recurrent multiple myeloma  REVIEW OF SYSTEMS:   Gen. status: Performance status is 2.  Patient is feeling better.  Appetite is improving.  HEENT: Patient has osteonecrosis of jaw bone.  Swelling on the left side of the neck is improved.  Lungs: Shortness of breath on exertion.  Cardiac: No chest pain no palpitation.  GI: Appetite is getting better no nausea no vomiting no diarrhea or rectal bleeding.  GU: No dysuria or hematuria.  Neurological system: Continues to no problem with maintaining balance.  No tingling.  No numbness.  Lower extremity no  swellings.  Skin: No rash.  No skeletal system joint pains continue to bother patient perstatus is improving. Swelling in the left jaw has been All other systems have been reviewed O significant family history  As per HPI. Otherwise, a complete review of systems is negatve.  PAST MEDICAL HISTORY: Past Medical History  Diagnosis Date  . Multiple myeloma   . Osteonecrosis of jaw     left  . Anxiety   . Coronary arteriosclerosis   . Hypertension   . Hyperlipidemia   . Arthritis   . Atrial fibrillation   . Multiple myeloma in relapse 10/29/2014    PAST SURGICAL HISTORY: Past Surgical History  Procedure Laterality Date  . Appendectomy    . Abdominal hysterectomy    . Total knee revision Left   . 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     ADVANCED DIRECTIVES:    HEALTH MAINTENANCE: History  Substance Use Topics  . Smoking status: Never Smoker   . Smokeless tobacco: Never Used  . Alcohol Use: 1.2 oz/week      2 Glasses of wine per week      Allergies  Allergen Reactions  . Biaxin [Clarithromycin] Diarrhea and Nausea And Vomiting  . Demerol [Meperidine] Nausea And Vomiting  . Hydrocodone Nausea And Vomiting  . Sulfa Antibiotics Hives  . Tramadol Diarrhea and Nausea And Vomiting  . Ultracet [Tramadol-Acetaminophen] Other (See Comments)    Current Outpatient Prescriptions  Medication Sig Dispense Refill  . aspirin 81 MG chewable tablet Chew 81 mg by mouth daily.    . azithromycin (ZITHROMAX) 500 MG tablet Take 1 tablet (500 mg total)  by mouth once. 5 tablet 0  . Biotin 1 MG CAPS Take by mouth.    . dexamethasone (DECADRON) 4 MG tablet Take 5 tablets (20 mg total) by mouth once a week. Take on day 1, 8, 15 15 tablet 6  . fluticasone (FLONASE) 50 MCG/ACT nasal spray Place 1 spray into both nostrils daily. 16 g 0  . ondansetron (ZOFRAN) 4 MG tablet Take 4 mg by mouth every 8 (eight) hours as needed for nausea or vomiting.    . pomalidomide (POMALYST) 4 MG capsule Take 1 capsule (4 mg total) by mouth daily. Take with water on days 1-14. Then 14 days off.    . ibuprofen (ADVIL,MOTRIN) 800 MG tablet Take 1 tablet (800 mg total) by mouth every 8 (eight) hours as needed for moderate pain. 30 tablet 3   No current facility-administered medications for this visit.    OBJECTIVE:  Filed Vitals:   01/02/15 1211  BP: 152/81  Pulse: 71  Temp: 97 F (36.1 C)     Body mass index is 14.79 kg/(m^2).    ECOG FS: 2  PHYSICAL EXAM: GENERAL  status: Performance status is 2  Patient has not lost significant weight Walking with the help of walker. HEENT: No evidence of stomatitis.  No swelling on the left side of the neck.  Osteonecrosis of jaw bone is stable.  No evidence of infection.   Sclera and conjunctivae :: No jaundice.   pale looking . Lungs: Air  entry equal on both sides.  No rhonchi.  No rales.  Cardiac: Heart sounds are normal.  No pericardial rub.  No murmur. Lymphatic system: Cervical, axillary, inguinal, lymph nodes not palpable GI: Abdomen is soft.  No ascites.  Liver spleen not palpable.  No tenderness.  Bowel sounds are within normal limit Lower extremity: No edema Neurologically, the patient was awake, alert, and oriented to person, place and time. There were no obvious focal neurologic abnormalities.ct No evidence of peripheral neuropathy. Examination of the skin revealed no evidence of significant rashes, suspicious appearing nevi or other concerning lesions.     LAB RESULTS:  Appointment on 01/02/2015    Component Date Value Ref Range Status  . WBC 01/02/2015 1.9* 3.6 - 11.0 K/uL Final  . RBC 01/02/2015 4.23  3.80 - 5.20 MIL/uL Final  . Hemoglobin 01/02/2015 12.8  12.0 - 16.0 g/dL Final  . HCT 01/02/2015 39.3  35.0 - 47.0 % Final  . MCV 01/02/2015 92.8  80.0 - 100.0 fL Final  . MCH 01/02/2015 30.2  26.0 - 34.0 pg Final  . MCHC 01/02/2015 32.6  32.0 - 36.0 g/dL Final  . RDW 01/02/2015 16.4* 11.5 - 14.5 % Final  . Platelets 01/02/2015 298  150 - 440 K/uL Final  . Neutrophils Relative % 01/02/2015 27   Final  . Neutro Abs 01/02/2015 0.5* 1.4 - 6.5 K/uL Final  . Lymphocytes Relative 01/02/2015 29   Final  .   Lymphs Abs 01/02/2015 0.6* 1.0 - 3.6 K/uL Final  . Monocytes Relative 01/02/2015 41   Final  . Monocytes Absolute 01/02/2015 0.8  0.2 - 0.9 K/uL Final  . Eosinophils Relative 01/02/2015 1   Final  . Eosinophils Absolute 01/02/2015 0.0  0 - 0.7 K/uL Final  . Basophils Relative 01/02/2015 2   Final  . Basophils Absolute 01/02/2015 0.0  0 - 0.1 K/uL Final  . Sodium 01/02/2015 138  135 - 145 mmol/L Final  . Potassium 01/02/2015 3.8  3.5 - 5.1 mmol/L Final  . Chloride 01/02/2015 105  101 - 111 mmol/L Final  . CO2 01/02/2015 26  22 - 32 mmol/L Final  . Glucose, Bld 01/02/2015 97  65 - 99 mg/dL Final  . BUN 01/02/2015 22* 6 - 20 mg/dL Final  . Creatinine, Ser 01/02/2015 0.85  0.44 - 1.00 mg/dL Final  . Calcium 01/02/2015 8.5* 8.9 - 10.3 mg/dL Final  . Total Protein 01/02/2015 6.9  6.5 - 8.1 g/dL Final  . Albumin 01/02/2015 3.8  3.5 - 5.0 g/dL Final  . AST 01/02/2015 15  15 - 41 U/L Final  . ALT 01/02/2015 10* 14 - 54 U/L Final  . Alkaline Phosphatase 01/02/2015 74  38 - 126 U/L Final  . Total Bilirubin 01/02/2015 0.7  0.3 - 1.2 mg/dL Final  . GFR calc non Af Amer 01/02/2015 59* >60 mL/min Final  . GFR calc Af Amer 01/02/2015 >60  >60 mL/min Final   Comment: (NOTE) The eGFR has been calculated using the CKD EPI equation. This calculation has not been validated in all clinical  situations. eGFR's persistently <60 mL/min signify possible Chronic Kidney Disease.   . Anion gap 01/02/2015 7  5 - 15 Final  . Kappa free light chain 01/02/2015 16.04  3.30 - 19.40 mg/L Final  . Lamda free light chains 01/02/2015 11.21  5.71 - 26.30 mg/L Final  . Kappa, lamda light chain ratio 01/02/2015 1.43  0.26 - 1.65 Final   Comment: (NOTE) Performed At: [facility] [place], [place] [id number] Lindon Romp MD IL:[id number]   . Total Protein ELP 01/02/2015 6.3  6.0 - 8.5 g/dL Final  . Albumin ELP 01/02/2015 3.5  2.9 - 4.4 g/dL Final  . Alpha-1-Globulin 01/02/2015 0.2  0.0 - 0.4 g/dL Final  . Alpha-2-Globulin 01/02/2015 0.6  0.4 - 1.0 g/dL Final  . Beta Globulin 01/02/2015 1.0  0.7 - 1.3 g/dL Final  . Gamma Globulin 01/02/2015 0.9  0.4 - 1.8 g/dL Final  . M-Spike, % 01/02/2015 0.6* Not Observed g/dL Final  . SPE Interp. 01/02/2015 Comment   Final   Comment: (NOTE) The SPE pattern demonstrates a single peak (M-spike) in the gamma region which may represent monoclonal protein. This peak may also be caused by circulating immune complexes, cryoglobulins, C-reactive protein, fibrinogen or hemolysis.  If clinically indicated, the presence of a monoclonal gammopathy may be confirmed by immuno- fixation, as well as an evaluation of the urine for the presence of Bence-Jones protein. Performed At: [facility] [place], [place] [id number] Lindon Romp MD FE:[id number]   . Comment 01/02/2015 Comment   Final   Comment: (NOTE) Protein electrophoresis scan will follow via computer, mail, or courier delivery.   Marland Kitchen GLOBULIN, TOTAL 01/02/2015 2.8  2.2 - 3.9 g/dL Corrected  . A/G Ratio 01/02/2015 1.3  0.7 - 1.7 Corrected      STUDIES: No results found.  ASSESSMENT: Multiple myeloma in relapse.  Patient had  a myelosuppression secondary to POMOLIDOMIDE .  Myelosuppression is resolved. We will proceed with pomolidomide and  Decadron Patient would be off, livedo might the white count improves.  Then will start 2 weeks on and 2 weeks program SIEP shows slightly increasing M spike and will be followed light chain is within acceptable range   MEDICAL DECISION MAKING:  All lab data including SIEP and light chain has been reviewed Continuing POMOLID and Decadron with reduced dose Osteonecrosis of jaw bone is stable Patient expressed understanding and was in agreement with this plan. She also understands that She can call clinic at any time with any questions, concerns, or complaints.    No matching staging information was found for the patient.  Forest Gleason, MD   01/08/2015 1:25 PM

## 2015-01-11 ENCOUNTER — Telehealth: Payer: Self-pay | Admitting: *Deleted

## 2015-01-11 DIAGNOSIS — R229 Localized swelling, mass and lump, unspecified: Secondary | ICD-10-CM

## 2015-01-11 NOTE — Telephone Encounter (Signed)
Referral made to Dr Jamal Collin per Dr Oliva Bustard as patient had no surgeon preference

## 2015-01-11 NOTE — Telephone Encounter (Signed)
statses that the "knot on her arm which Dr Oliva Bustard opened at her last visit has come back just as big adn that he mentioned a surgical consult if it come back. Wants it removed because it is painful

## 2015-01-16 ENCOUNTER — Encounter: Payer: Self-pay | Admitting: General Surgery

## 2015-01-16 ENCOUNTER — Ambulatory Visit (INDEPENDENT_AMBULATORY_CARE_PROVIDER_SITE_OTHER): Payer: Medicare Other | Admitting: General Surgery

## 2015-01-16 VITALS — BP 122/66 | HR 68 | Resp 13 | Ht 62.0 in | Wt 102.8 lb

## 2015-01-16 DIAGNOSIS — R2231 Localized swelling, mass and lump, right upper limb: Secondary | ICD-10-CM

## 2015-01-16 DIAGNOSIS — C44622 Squamous cell carcinoma of skin of right upper limb, including shoulder: Secondary | ICD-10-CM | POA: Diagnosis not present

## 2015-01-16 NOTE — Progress Notes (Signed)
Patient ID: [name], female   DOB: 29-Mar-1925, 81 y.o.   MRN: [id number]  Chief Complaint  Patient presents with  . Other    knot under right arm    HPI [name] is a 81 y.o. female here for problems with a knot on her right forearm. This was opened and drained on 01/02/15, but has returned. The patient has a history of multiple myeloma that relapsed in 10/2014.  HPI  Past Medical History  Diagnosis Date  . Osteonecrosis of jaw     left  . Anxiety   . Coronary arteriosclerosis   . Hypertension   . Hyperlipidemia   . Arthritis   . Atrial fibrillation   . Multiple myeloma   . Multiple myeloma in relapse 10/29/2014  . History of blood transfusion 2014    Past Surgical History  Procedure Laterality Date  . Appendectomy    . Abdominal hysterectomy    . Total knee revision Left   . Partial colectomy      Family History  Problem Relation Age of Onset  . Cancer Father   . Heart attack Mother     Social History History  Substance Use Topics  . Smoking status: Never Smoker   . Smokeless tobacco: Never Used  . Alcohol Use: 1.2 oz/week    2 Glasses of wine per week    Allergies  Allergen Reactions  . Biaxin [Clarithromycin] Diarrhea and Nausea And Vomiting  . Demerol [Meperidine] Nausea And Vomiting  . Hydrocodone Nausea And Vomiting  . Sulfa Antibiotics Hives  . Tramadol Diarrhea and Nausea And Vomiting  . Ultracet [Tramadol-Acetaminophen] Other (See Comments)    Current Outpatient Prescriptions  Medication Sig Dispense Refill  . aspirin 81 MG chewable tablet Chew 81 mg by mouth daily.    . Biotin 1 MG CAPS Take by mouth.    . dexamethasone (DECADRON) 4 MG tablet Take 5 tablets (20 mg total) by mouth once a week. Take on day 1, 8, 15 15 tablet 6  . fluticasone (FLONASE) 50 MCG/ACT nasal spray Place 1 spray into both nostrils daily. 16 g 0  . ibuprofen (ADVIL,MOTRIN) 800 MG tablet Take 1 tablet (800 mg total) by mouth every 8 (eight) hours as  needed for moderate pain. 30 tablet 3  . ondansetron (ZOFRAN) 4 MG tablet Take 4 mg by mouth every 8 (eight) hours as needed for nausea or vomiting.    . pomalidomide (POMALYST) 4 MG capsule Take 1 capsule (4 mg total) by mouth daily. Take with water on days 1-14. Then 14 days off.     No current facility-administered medications for this visit.    Review of Systems Review of Systems  Constitutional: Negative.   Respiratory: Negative.   Cardiovascular: Negative.     Blood pressure 122/66, pulse 68, resp. rate 13, height '5\' 2"'  (1.575 m), weight 102 lb 12.8 oz (46.63 kg).  Physical Exam Physical Exam  Constitutional: She is oriented to person, place, and time. She appears well-developed and well-nourished.  Eyes: Conjunctivae are normal.  Neck: Neck supple.  Lymphadenopathy:    She has no cervical adenopathy.  Neurological: She is alert and oriented to person, place, and time.  Skin: Skin is warm and dry.     1.5 cm firm tender nodule in the right forearm on the lateral border. A small scab in the middle but no drainage noted.     Data Reviewed   Assessment    Skin nodule of uncertain  nature in right forearm.     Plan    Procedure:  Patient was placed in supine position on the operating table. Area was injected with 6 mL of 1% Xylocaine and 0.5% Marcaine.  Area was prepped with chlorahexadine and covered with sterile dressing.  A 3cm long elliptical incision was made with 15 blade scalpel around the skin nodule. The nodule was then excised with at least 2-5m margins. Pressure was applied.  The excised tissue was tagged with nylon at the superior end and sent to pathology. Bleeders were cauterized with surgical cautery and two 3.0 Vicryl sutures.  Subcutaneous tissue closed with 3.0 Vicryl  Skin was closed with 5.0 Prolene Incision site was cleansed. Neosporin ointment, teflon, and tegaderm applied.  Patient tolerated procedure well with minimal discomfort. She was  advised on wound care and return in 10-14 days for suture removal. She will be notified of pathology when available. Advised to use ibuprofen as needed for pain.     PCP:  BCarlis Abbott8/01/2015, 4:21 PM

## 2015-01-16 NOTE — Patient Instructions (Addendum)
Can remove dressing in 2 days. Keep area clean   Follow up for removal of sutures in ten days.

## 2015-01-26 ENCOUNTER — Ambulatory Visit (INDEPENDENT_AMBULATORY_CARE_PROVIDER_SITE_OTHER): Payer: Medicare Other | Admitting: *Deleted

## 2015-01-26 DIAGNOSIS — R2231 Localized swelling, mass and lump, right upper limb: Secondary | ICD-10-CM

## 2015-01-26 NOTE — Patient Instructions (Signed)
The patient is aware to call back for any questions or concerns.  

## 2015-01-26 NOTE — Progress Notes (Signed)
Patient came in today for a wound check, excision right arm mass. Aware of pathology.  The wound is clean, with no signs of infection noted. Follow up as needed.

## 2015-02-02 ENCOUNTER — Inpatient Hospital Stay (HOSPITAL_BASED_OUTPATIENT_CLINIC_OR_DEPARTMENT_OTHER): Payer: Medicare Other | Admitting: Oncology

## 2015-02-02 ENCOUNTER — Inpatient Hospital Stay: Payer: Medicare Other | Attending: Oncology

## 2015-02-02 ENCOUNTER — Encounter: Payer: Self-pay | Admitting: Oncology

## 2015-02-02 ENCOUNTER — Ambulatory Visit: Payer: PRIVATE HEALTH INSURANCE | Admitting: Oncology

## 2015-02-02 ENCOUNTER — Other Ambulatory Visit: Payer: PRIVATE HEALTH INSURANCE

## 2015-02-02 VITALS — BP 172/79 | HR 57 | Temp 94.6°F | Wt 101.0 lb

## 2015-02-02 DIAGNOSIS — Z7982 Long term (current) use of aspirin: Secondary | ICD-10-CM

## 2015-02-02 DIAGNOSIS — C792 Secondary malignant neoplasm of skin: Secondary | ICD-10-CM | POA: Diagnosis not present

## 2015-02-02 DIAGNOSIS — I10 Essential (primary) hypertension: Secondary | ICD-10-CM | POA: Diagnosis not present

## 2015-02-02 DIAGNOSIS — E785 Hyperlipidemia, unspecified: Secondary | ICD-10-CM | POA: Insufficient documentation

## 2015-02-02 DIAGNOSIS — C7641 Malignant neoplasm of right upper limb: Secondary | ICD-10-CM

## 2015-02-02 DIAGNOSIS — Z79899 Other long term (current) drug therapy: Secondary | ICD-10-CM | POA: Insufficient documentation

## 2015-02-02 DIAGNOSIS — Z8739 Personal history of other diseases of the musculoskeletal system and connective tissue: Secondary | ICD-10-CM

## 2015-02-02 DIAGNOSIS — M1990 Unspecified osteoarthritis, unspecified site: Secondary | ICD-10-CM

## 2015-02-02 DIAGNOSIS — C9002 Multiple myeloma in relapse: Secondary | ICD-10-CM | POA: Insufficient documentation

## 2015-02-02 DIAGNOSIS — Z8673 Personal history of transient ischemic attack (TIA), and cerebral infarction without residual deficits: Secondary | ICD-10-CM

## 2015-02-02 DIAGNOSIS — C44629 Squamous cell carcinoma of skin of left upper limb, including shoulder: Secondary | ICD-10-CM | POA: Insufficient documentation

## 2015-02-02 DIAGNOSIS — I2510 Atherosclerotic heart disease of native coronary artery without angina pectoris: Secondary | ICD-10-CM | POA: Insufficient documentation

## 2015-02-02 LAB — CBC WITH DIFFERENTIAL/PLATELET
BASOS ABS: 0 10*3/uL (ref 0–0.1)
BASOS PCT: 1 %
Eosinophils Absolute: 0.1 10*3/uL (ref 0–0.7)
Eosinophils Relative: 3 %
HEMATOCRIT: 38.7 % (ref 35.0–47.0)
HEMOGLOBIN: 12.9 g/dL (ref 12.0–16.0)
Lymphocytes Relative: 26 %
Lymphs Abs: 0.5 10*3/uL — ABNORMAL LOW (ref 1.0–3.6)
MCH: 31.1 pg (ref 26.0–34.0)
MCHC: 33.4 g/dL (ref 32.0–36.0)
MCV: 93 fL (ref 80.0–100.0)
Monocytes Absolute: 0.9 10*3/uL (ref 0.2–0.9)
Monocytes Relative: 44 %
NEUTROS ABS: 0.5 10*3/uL — AB (ref 1.4–6.5)
NEUTROS PCT: 26 %
Platelets: 296 10*3/uL (ref 150–440)
RBC: 4.16 MIL/uL (ref 3.80–5.20)
RDW: 16.3 % — ABNORMAL HIGH (ref 11.5–14.5)
WBC: 2.1 10*3/uL — ABNORMAL LOW (ref 3.6–11.0)

## 2015-02-02 LAB — COMPREHENSIVE METABOLIC PANEL
ALBUMIN: 3.8 g/dL (ref 3.5–5.0)
ALK PHOS: 68 U/L (ref 38–126)
ALT: 15 U/L (ref 14–54)
AST: 18 U/L (ref 15–41)
Anion gap: 4 — ABNORMAL LOW (ref 5–15)
BILIRUBIN TOTAL: 0.7 mg/dL (ref 0.3–1.2)
BUN: 24 mg/dL — AB (ref 6–20)
CALCIUM: 8.3 mg/dL — AB (ref 8.9–10.3)
CO2: 28 mmol/L (ref 22–32)
Chloride: 102 mmol/L (ref 101–111)
Creatinine, Ser: 0.78 mg/dL (ref 0.44–1.00)
GFR calc Af Amer: >60 mL/min (ref >60)
GFR calc non Af Amer: >60 mL/min (ref >60)
GLUCOSE: 96 mg/dL (ref 65–99)
Potassium: 3.3 mmol/L — ABNORMAL LOW (ref 3.5–5.1)
Sodium: 134 mmol/L — ABNORMAL LOW (ref 135–145)
TOTAL PROTEIN: 6.6 g/dL (ref 6.5–8.1)

## 2015-02-02 NOTE — Progress Notes (Signed)
Patient does have living will.  Former smoker. 

## 2015-02-03 ENCOUNTER — Encounter: Payer: Self-pay | Admitting: Oncology

## 2015-02-03 LAB — PROTEIN ELECTROPHORESIS, SERUM
A/G Ratio: 1.3 (ref 0.7–1.7)
ALBUMIN ELP: 3.4 g/dL (ref 2.9–4.4)
ALPHA-1-GLOBULIN: 0.2 g/dL (ref 0.0–0.4)
Alpha-2-Globulin: 0.7 g/dL (ref 0.4–1.0)
BETA GLOBULIN: 1 g/dL (ref 0.7–1.3)
GAMMA GLOBULIN: 0.7 g/dL (ref 0.4–1.8)
Globulin, Total: 2.6 g/dL (ref 2.2–3.9)
M-SPIKE, %: 0.5 g/dL — AB
TOTAL PROTEIN ELP: 6 g/dL (ref 6.0–8.5)

## 2015-02-03 LAB — KAPPA/LAMBDA LIGHT CHAINS
KAPPA FREE LGHT CHN: 16.25 mg/L (ref 3.30–19.40)
KAPPA, LAMDA LIGHT CHAIN RATIO: 1.68 — AB (ref 0.26–1.65)
Lambda free light chains: 9.68 mg/L (ref 5.71–26.30)

## 2015-02-03 NOTE — Progress Notes (Signed)
[place] @ [facility] [phone]  Fax:([phone]) [place] OB: 09-08-1924  MR#: [id number]  GYF#:[id number]  Patient Care Team: Derinda Late, MD as PCP - General (Family Medicine) Forest Gleason, MD (Oncology) Seeplaputhur Robinette Haines, MD (General Surgery)  CHIEF COMPLAINT:  Chief Complaint  Patient presents with  . Follow-up      No flowsheet data found.  INTERVAL HISTORY: 81 year old lady with a history of multiple myeloma.  Taking  POMOLID .  Because of myelosuppression patient had been off treatment.  Had osteonecrosis of jaw bone recent admission in the hospital with pain and swelling on the left side of the neck which is resolved.  Appetite is improved.  Patient had be sort of TIA which is resolved.  Gradually declining performance status with more and more difficulty in ambulation and walking with the help of walker. December 05, 2014 Patient is here for further follow-up regarding multiple myeloma.  Gradually getting better.  Left jaw pain has improved.  No chills.  No fever.  Bony pensive improved.  Patient is here for ongoing evaluation and continuation of treatment regarding multiple myeloma In July, 2016 Patient is on slightly reduced dose of  pomolidomide.  No chills and fever.  Patient also had a recent tooth extraction which is healing well.  Osteonecrosis of jaw bone has been stabilized.  Patient Gen. been stabilized Year for further follow-up and treatment regarding relapse of recurrent multiple myeloma February 02, 2015 Patient is excision of the right upper extremity skin lesion which was squamous cell carcinoma.  Patient continues to have some difficulty maintaining balance.  Also myelosuppression secondary  To pomolidomide  Here for further follow-up and treatment consideration  REVIEW OF SYSTEMS:   Gen. status: Performance status is 2.  Patient is feeling better.  Appetite is improving.  HEENT: Patient has osteonecrosis of jaw bone.  Swelling  on the left side of the neck is improved.  Lungs: Shortness of breath on exertion.  Cardiac: No chest pain no palpitation.  GI: Appetite is getting better no nausea no vomiting no diarrhea or rectal bleeding.  GU: No dysuria or hematuria.  Neurological system: Continues to no problem with maintaining balance.  No tingling.  No numbness.  Lower extremity no swellings.  Skin: No rash.  No skeletal system joint pains continue to bother patient perstatus is improving. Swelling in the left jaw has been All other systems have been reviewed O significant family history  As per HPI. Otherwise, a complete review of systems is negatve.  PAST MEDICAL HISTORY: Past Medical History  Diagnosis Date  . Osteonecrosis of jaw     left  . Anxiety   . Coronary arteriosclerosis   . Hypertension   . Hyperlipidemia   . Arthritis   . Atrial fibrillation   . Multiple myeloma   . Multiple myeloma in relapse 10/29/2014  . History of blood transfusion 2014    PAST SURGICAL HISTORY: Past Surgical History  Procedure Laterality Date  . Appendectomy    . Abdominal hysterectomy    . Total knee revision Left   . 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 Patient is postmenopausal.     ADVANCED DIRECTIVES:    HEALTH MAINTENANCE: Social History  Substance Use Topics  . Smoking status: Never Smoker   . Smokeless tobacco: Never Used  . Alcohol Use: 1.2 oz/week    2 Glasses of wine per week      Allergies  Allergen Reactions  . Biaxin [Clarithromycin] Diarrhea and Nausea And  Vomiting  . Demerol [Meperidine] Nausea And Vomiting  . Hydrocodone Nausea And Vomiting  . Sulfa Antibiotics Hives  . Tramadol Diarrhea and Nausea And Vomiting  . Ultracet [Tramadol-Acetaminophen] Other (See Comments)    Current Outpatient Prescriptions  Medication Sig Dispense Refill  . aspirin 81 MG chewable tablet Chew 81 mg by mouth daily.    . Biotin 1 MG CAPS Take by mouth.    . dexamethasone (DECADRON) 4 MG tablet Take 5 tablets (20 mg total) by mouth once a week. Take on day 1, 8, 15 15 tablet 6  . fluticasone (FLONASE) 50 MCG/ACT nasal spray Place 1 spray into both nostrils daily. 16 g 0  . ibuprofen (ADVIL,MOTRIN) 800 MG tablet Take 1 tablet (800 mg total) by mouth every 8 (eight) hours as needed for moderate pain. 30 tablet 3  . ondansetron (ZOFRAN) 4 MG tablet Take 4 mg by mouth every 8 (eight) hours as needed for nausea or vomiting.    . pomalidomide (POMALYST) 4 MG capsule Take 1 capsule (4 mg total) by mouth daily. Take with water on days 1-14. Then 14 days off.     No current facility-administered medications for this visit.    OBJECTIVE:  Filed Vitals:   02/02/15 1136  BP: 172/79  Pulse: 57  Temp: 94.6 F (34.8 C)     Body mass index is 18.47 kg/(m^2).    ECOG FS: 2  PHYSICAL EXAM: GENERAL  status: Performance status is 2  Patient has not lost significant weight Walking with the help of walker. HEENT: No evidence of stomatitis.  No swelling on the left side of the neck.  Osteonecrosis of jaw bone is stable.  No evidence of infection.   Sclera and conjunctivae :: No jaundice.   pale looking . Lungs: Air  entry equal on both sides.  No rhonchi.  No rales.  Cardiac: Heart sounds are normal.  No pericardial rub.  No murmur. Lymphatic system: Cervical, axillary, inguinal, lymph nodes not palpable GI: Abdomen is soft.  No ascites.  Liver spleen not palpable.  No tenderness.  Bowel sounds are within normal limit Lower extremity: No edema Neurologically, the patient  was awake, alert, and oriented to person, place and time. There were no obvious focal neurologic abnormalities.ct No evidence of peripheral neuropathy. Examination of the skin revealed no evidence of significant rashes, suspicious appearing nevi or other concerning lesions.     LAB RESULTS:  Appointment on 02/02/2015  Component Date Value Ref Range Status  . WBC 02/02/2015 2.1* 3.6 - 11.0 K/uL Final  . RBC 02/02/2015 4.16  3.80 - 5.20 MIL/uL Final  . Hemoglobin 02/02/2015 12.9  12.0 - 16.0 g/dL Final  . HCT 02/02/2015 38.7  35.0 - 47.0 % Final  . MCV 02/02/2015 93.0  80.0 - 100.0 fL Final  . MCH 02/02/2015 31.1  26.0 - 34.0 pg Final  . MCHC 02/02/2015 33.4  32.0 - 36.0 g/dL Final  . RDW 02/02/2015 16.3* 11.5 - 14.5 %  Final  . Platelets 02/02/2015 296  150 - 440 K/uL Final  . Neutrophils Relative % 02/02/2015 26   Final  . Neutro Abs 02/02/2015 0.5* 1.4 - 6.5 K/uL Final  . Lymphocytes Relative 02/02/2015 26   Final  . Lymphs Abs 02/02/2015 0.5* 1.0 - 3.6 K/uL Final  . Monocytes Relative 02/02/2015 44   Final  . Monocytes Absolute 02/02/2015 0.9  0.2 - 0.9 K/uL Final  . Eosinophils Relative 02/02/2015 3   Final  . Eosinophils Absolute 02/02/2015 0.1  0 - 0.7 K/uL Final  . Basophils Relative 02/02/2015 1   Final  . Basophils Absolute 02/02/2015 0.0  0 - 0.1 K/uL Final  . Sodium 02/02/2015 134* 135 - 145 mmol/L Final  . Potassium 02/02/2015 3.3* 3.5 - 5.1 mmol/L Final  . Chloride 02/02/2015 102  101 - 111 mmol/L Final  . CO2 02/02/2015 28  22 - 32 mmol/L Final  . Glucose, Bld 02/02/2015 96  65 - 99 mg/dL Final  . BUN 02/02/2015 24* 6 - 20 mg/dL Final  . Creatinine, Ser 02/02/2015 0.78  0.44 - 1.00 mg/dL Final  . Calcium 02/02/2015 8.3* 8.9 - 10.3 mg/dL Final  . Total Protein 02/02/2015 6.6  6.5 - 8.1 g/dL Final  . Albumin 02/02/2015 3.8  3.5 - 5.0 g/dL Final  . AST 02/02/2015 18  15 - 41 U/L Final  . ALT 02/02/2015 15  14 - 54 U/L Final  . Alkaline Phosphatase 02/02/2015 68  38  - 126 U/L Final  . Total Bilirubin 02/02/2015 0.7  0.3 - 1.2 mg/dL Final  . GFR calc non Af Amer 02/02/2015 >60  >60 mL/min Final  . GFR calc Af Amer 02/02/2015 >60  >60 mL/min Final   Comment: (NOTE) The eGFR has been calculated using the CKD EPI equation. This calculation has not been validated in all clinical situations. eGFR's persistently <60 mL/min signify possible Chronic Kidney Disease.   . Anion gap 02/02/2015 4* 5 - 15 Final  . Kappa free light chain 02/02/2015 16.25  3.30 - 19.40 mg/L Final  . Lamda free light chains 02/02/2015 9.68  5.71 - 26.30 mg/L Final  . Kappa, lamda light chain ratio 02/02/2015 1.68* 0.26 - 1.65 Final   Comment: (NOTE) Performed At: [facility] [place], [place] [id number] Lindon Romp MD SN:[id number]   . Total Protein ELP 02/02/2015 6.0  6.0 - 8.5 g/dL Final  . Albumin ELP 02/02/2015 3.4  2.9 - 4.4 g/dL Final  . Alpha-1-Globulin 02/02/2015 0.2  0.0 - 0.4 g/dL Final  . Alpha-2-Globulin 02/02/2015 0.7  0.4 - 1.0 g/dL Final  . Beta Globulin 02/02/2015 1.0  0.7 - 1.3 g/dL Final  . Gamma Globulin 02/02/2015 0.7  0.4 - 1.8 g/dL Final  . M-Spike, % 02/02/2015 0.5* Not Observed g/dL Final  . SPE Interp. 02/02/2015 Comment   Final   Comment: (NOTE) The SPE pattern demonstrates a single peak (M-spike) in the gamma region which may represent monoclonal protein. This peak may also be caused by circulating immune complexes, cryoglobulins, C-reactive protein, fibrinogen or hemolysis.  If clinically indicated, the presence of a monoclonal gammopathy may be confirmed by immuno- fixation, as well as an evaluation of the urine for the presence of Bence-Jones protein. Performed At: [facility] [place], [place] [id number] Lindon Romp MD BD:[id number]   . Comment 02/02/2015 Comment   Final   Comment: (NOTE) Protein electrophoresis scan will follow via computer, mail, or courier delivery.   Marland Kitchen  GLOBULIN,  TOTAL 02/02/2015 2.6  2.2 - 3.9 g/dL Corrected  . A/G Ratio 02/02/2015 1.3  0.7 - 1.7 Corrected    Diagnosis Skin , right arm INVASIVE SQUAMOUS CELL CARCINOMA (KERATOACANTHOMA TYPE). MARGINS OF RESECTION ARE NEGATIVE. Casimer Lanius MD Pathologist, Electronic Signature (Case signed 01/20/2015)  STUDIES: ctrophoresis, serum  Status: EditedResult-FINAL Visible to patient:  Not Released Nextappt: 03/01/2015 at 11:00 AM in Oncology ([facility]) Dx:  Multiple myeloma in relapse              Ref Range 1d ago  60moago  285mogo     Total Protein ELP 6.0 - 8.5 g/dL 6.0 6.3 6.3    Albumin ELP 2.9 - 4.4 g/dL 3.4 3.5 3.6    Alpha-1-Globulin 0.0 - 0.4 g/dL 0.2 0.2 0.2    Alpha-2-Globulin 0.4 - 1.0 g/dL 0.7 0.6 0.6    Beta Globulin 0.7 - 1.3 g/dL 1.0 1.0 1.0    Gamma Globulin 0.4 - 1.8 g/dL 0.7 0.9 0.9    M-Spike, % Not Observed g/dL 0.5 (H) 0.6 0.6         Kappa/lambda light chains (Order [id number]     Kappa/lambda light chains  Status: Finalresult Visible to patient:  Not Released Nextappt: 03/01/2015 at 11:00 AM in Oncology ([facility]) Dx:  Multiple myeloma in relapse              Ref Range 1d ago  80m8moo  61mo35mo  40mo 680mo    Kappa free light chain 3.30 - 19.40 mg/L 16.25 16.04 18.31 14.97    Lamda free light chains 5.71 - 26.30 mg/L 9.68 11.21 11.72 11.56    Kappa, lamda light chain ratio 0.26 - 1.65  1.68 (H) 1.43CM 1.56CM 1.29CM         ASSESSMENT: Multiple myeloma in relapse.  Patient had a myelosuppression secondary to POMOLIDOMIDE .  Myelosuppression is resolved. We will proceed with pomolidomide and Decadron Patient would be off, livedo might the white count improves.  Then will start 2 weeks on and 2 weeks program SIEP shows slightly increasing M spike and will be followed light chain is within acceptable range    MEDICAL DECISION MAKING:  All lab data including SIEP and light chain has been  reviewed Continuing POMOLID and Decadron with reduced dose Osteonecrosis of jaw bone is stable  Myelosuppression Patient was instructed to call me if spikes I fever hold off further pomolidomide  till next visit when CBC will be rechecked. SIEP is stable Total duration of visit was 30 ,.  50% or more time was spent in counseling patient and family regarding prognosis and options of treatment and available resources Patient expressed understanding and was in agreement with this plan. She also understands that She can call clinic at any time with any questions, concerns, or complaints.    No matching staging information was found for the patient.  JanakForest Gleason  02/03/2015 4:53 PM

## 2015-02-14 ENCOUNTER — Telehealth: Payer: Self-pay | Admitting: *Deleted

## 2015-02-14 NOTE — Telephone Encounter (Signed)
Per Dr Oliva Bustard, see her tomorrow. [name] agrees to 200 appt 9/7

## 2015-02-14 NOTE — Telephone Encounter (Signed)
Tripped and fell 3 weeks ago and hurt the front of both legs. Asking to be seen today because the l leg is not healing and keeps bleeding and is very painful

## 2015-02-15 ENCOUNTER — Other Ambulatory Visit: Payer: Medicare Other

## 2015-02-15 ENCOUNTER — Inpatient Hospital Stay: Payer: Medicare Other | Attending: Oncology | Admitting: Oncology

## 2015-02-15 VITALS — BP 153/74 | HR 60 | Temp 96.8°F | Wt 103.0 lb

## 2015-02-15 DIAGNOSIS — R2689 Other abnormalities of gait and mobility: Secondary | ICD-10-CM | POA: Diagnosis not present

## 2015-02-15 DIAGNOSIS — E785 Hyperlipidemia, unspecified: Secondary | ICD-10-CM | POA: Diagnosis not present

## 2015-02-15 DIAGNOSIS — I2510 Atherosclerotic heart disease of native coronary artery without angina pectoris: Secondary | ICD-10-CM | POA: Diagnosis not present

## 2015-02-15 DIAGNOSIS — M1990 Unspecified osteoarthritis, unspecified site: Secondary | ICD-10-CM | POA: Diagnosis not present

## 2015-02-15 DIAGNOSIS — Z23 Encounter for immunization: Secondary | ICD-10-CM | POA: Insufficient documentation

## 2015-02-15 DIAGNOSIS — Z8739 Personal history of other diseases of the musculoskeletal system and connective tissue: Secondary | ICD-10-CM

## 2015-02-15 DIAGNOSIS — M87180 Osteonecrosis due to drugs, jaw: Secondary | ICD-10-CM | POA: Insufficient documentation

## 2015-02-15 DIAGNOSIS — Z7982 Long term (current) use of aspirin: Secondary | ICD-10-CM

## 2015-02-15 DIAGNOSIS — I4891 Unspecified atrial fibrillation: Secondary | ICD-10-CM | POA: Insufficient documentation

## 2015-02-15 DIAGNOSIS — T50995S Adverse effect of other drugs, medicaments and biological substances, sequela: Secondary | ICD-10-CM | POA: Diagnosis not present

## 2015-02-15 DIAGNOSIS — F419 Anxiety disorder, unspecified: Secondary | ICD-10-CM | POA: Diagnosis not present

## 2015-02-15 DIAGNOSIS — L03116 Cellulitis of left lower limb: Secondary | ICD-10-CM | POA: Insufficient documentation

## 2015-02-15 DIAGNOSIS — C9002 Multiple myeloma in relapse: Secondary | ICD-10-CM | POA: Diagnosis not present

## 2015-02-15 DIAGNOSIS — I10 Essential (primary) hypertension: Secondary | ICD-10-CM | POA: Diagnosis not present

## 2015-02-15 DIAGNOSIS — L03115 Cellulitis of right lower limb: Secondary | ICD-10-CM | POA: Insufficient documentation

## 2015-02-15 DIAGNOSIS — Z79899 Other long term (current) drug therapy: Secondary | ICD-10-CM | POA: Diagnosis not present

## 2015-02-15 LAB — CBC WITH DIFFERENTIAL/PLATELET
BASOS ABS: 0 10*3/uL (ref 0–0.1)
BASOS PCT: 0 %
EOS ABS: 0 10*3/uL (ref 0–0.7)
Eosinophils Relative: 0 %
HEMATOCRIT: 38.3 % (ref 35.0–47.0)
Hemoglobin: 12.6 g/dL (ref 12.0–16.0)
Lymphocytes Relative: 9 %
Lymphs Abs: 0.4 10*3/uL — ABNORMAL LOW (ref 1.0–3.6)
MCH: 30.9 pg (ref 26.0–34.0)
MCHC: 33 g/dL (ref 32.0–36.0)
MCV: 93.5 fL (ref 80.0–100.0)
MONO ABS: 1.1 10*3/uL — AB (ref 0.2–0.9)
Monocytes Relative: 23 %
NEUTROS ABS: 3.4 10*3/uL (ref 1.4–6.5)
NEUTROS PCT: 68 %
Platelets: 210 10*3/uL (ref 150–440)
RBC: 4.1 MIL/uL (ref 3.80–5.20)
RDW: 16.3 % — AB (ref 11.5–14.5)
WBC: 4.9 10*3/uL (ref 3.6–11.0)

## 2015-02-15 MED ORDER — CEPHALEXIN 500 MG PO CAPS: 500.0000 mg | ORAL_CAPSULE | Freq: Three times a day (TID) | ORAL | Status: DC

## 2015-02-15 NOTE — Progress Notes (Signed)
Patient does have living will.  Never smoked.  Patient here today for further evaluation regarding her legs.  She fell a few weeks ago and hurt lower legs.  Afraid that the fall affected her bones since she has bone cancer.

## 2015-02-15 NOTE — Progress Notes (Signed)
[place] @ [facility] [phone]  Fax:(336) [place] OB: 1925/04/10  MR#: [id number]  LTJ#:[id number]  Patient Care Team: Derinda Late, MD as PCP - General (Family Medicine) Forest Gleason, MD (Oncology) Seeplaputhur Robinette Haines, MD (General Surgery)  CHIEF COMPLAINT:  Chief Complaint  Patient presents with  . Follow-up      No flowsheet data found.  INTERVAL HISTORY: 81 year old lady with a history of multiple myeloma.  Taking  POMOLID .  Because of myelosuppression patient had been off treatment.  Had osteonecrosis of jaw bone recent admission in the hospital with pain and swelling on the left side of the neck which is resolved.  Appetite is improved.  Patient had be sort of TIA which is resolved.  Gradually declining performance status with more and more difficulty in ambulation and walking with the help of walker. December 05, 2014 Patient is here for further follow-up regarding multiple myeloma.  Gradually getting better.  Left jaw pain has improved.  No chills.  No fever.  Bony pensive improved.  Patient is here for ongoing evaluation and continuation of treatment regarding multiple myeloma In July, 2016 Patient is on slightly reduced dose of  pomolidomide.  No chills and fever.  Patient also had a recent tooth extraction which is healing well.  Osteonecrosis of jaw bone has been stabilized.  Patient Gen. been stabilized Year for further follow-up and treatment regarding relapse of recurrent multiple myeloma February 02, 2015 Patient is excision of the right upper extremity skin lesion which was squamous cell carcinoma.  Patient continues to have some difficulty maintaining balance.  Also myelosuppression secondary  To pomolidomide  Here for further follow-up and treatment consideration September, 2016 [place] is here for ongoing evaluation patient fell and ulceration in the both legs.  Which gradually getting worse.  Patient went through a period of  myelosuppression resulting into some cellulitis around it. Here for further follow-up having pain full ulceration of both lower extremity   REVIEW OF SYSTEMS:   Gen. status: Performance status is 2.  Patient is feeling better.  Appetite is improving.  HEENT: Patient has osteonecrosis of jaw bone.  Swelling on the left side of the neck is improved.  Lungs: Shortness of breath on exertion.  Cardiac: No chest pain no palpitation.  GI: Appetite is getting better no nausea no vomiting no diarrhea or rectal bleeding.  GU: No dysuria or hematuria.  Neurological system: Continues to no problem with maintaining balance.  No tingling.  No numbness.  Lower extremity no swellings.  Skin: No rash.  No skeletal system joint pains continue to bother patient perstatus is improving. Swelling in the left jaw has been All other systems have been reviewed O significant family history  As per HPI. Otherwise, a complete review of systems is negatve.  PAST MEDICAL HISTORY: Past Medical History  Diagnosis Date  . Osteonecrosis of jaw     left  . Anxiety   . Coronary arteriosclerosis   . Hypertension   . Hyperlipidemia   . Arthritis   . Atrial fibrillation   . Multiple myeloma   . Multiple myeloma in relapse 10/29/2014  . History of blood transfusion 2014    PAST SURGICAL HISTORY: Past Surgical History  Procedure Laterality Date  . Appendectomy    . Abdominal hysterectomy    . Total knee revision Left   . Partial colectomy    Significant History/PMH:   Multiple Myeloma:    Coronary Artherosclerosis:  Hypertension:    Hyperlipidemia:    arthritis.:    Atrial Fibrillation:    hysterectomy:    left total knee replacement: 18-Mar-2007   Surgery to lift dropped kidney: 1945   D&C:    appendectomy:    Partial colectomy:    FAMILY HISTORY No significant family history of colon cancer breast cancer or ovarian cancer  GYNECOLOGIC HISTORY:  No LMP recorded. Patient is postmenopausal.       ADVANCED DIRECTIVES:    HEALTH MAINTENANCE: Social History  Substance Use Topics  . Smoking status: Never Smoker   . Smokeless tobacco: Never Used  . Alcohol Use: 1.2 oz/week    2 Glasses of wine per week      Allergies  Allergen Reactions  . Biaxin [Clarithromycin] Diarrhea and Nausea And Vomiting  . Demerol [Meperidine] Nausea And Vomiting  . Hydrocodone Nausea And Vomiting  . Sulfa Antibiotics Hives  . Tramadol Diarrhea and Nausea And Vomiting  . Ultracet [Tramadol-Acetaminophen] Other (See Comments)    Current Outpatient Prescriptions  Medication Sig Dispense Refill  . aspirin 81 MG chewable tablet Chew 81 mg by mouth daily.    . Biotin 1 MG CAPS Take by mouth.    . dexamethasone (DECADRON) 4 MG tablet Take 5 tablets (20 mg total) by mouth once a week. Take on day 1, 8, 15 15 tablet 6  . fluticasone (FLONASE) 50 MCG/ACT nasal spray Place 1 spray into both nostrils daily. 16 g 0  . ibuprofen (ADVIL,MOTRIN) 800 MG tablet Take 1 tablet (800 mg total) by mouth every 8 (eight) hours as needed for moderate pain. 30 tablet 3  . ondansetron (ZOFRAN) 4 MG tablet Take 4 mg by mouth every 8 (eight) hours as needed for nausea or vomiting.    . pomalidomide (POMALYST) 4 MG capsule Take 1 capsule (4 mg total) by mouth daily. Take with water on days 1-14. Then 14 days off.     No current facility-administered medications for this visit.    OBJECTIVE:  Filed Vitals:   02/15/15 1436  BP: 153/74  Pulse: 60  Temp: 96.8 F (36 C)     Body mass index is 18.83 kg/(m^2).    ECOG FS: 2  PHYSICAL EXAM: GENERAL  status: Performance status is 2  Patient has not lost significant weight Walking with the help of walker. HEENT: No evidence of stomatitis.  No swelling on the left side of the neck.  Osteonecrosis of jaw bone is stable.  No evidence of infection.   Sclera and conjunctivae :: No jaundice.   pale looking . Lungs: Air  entry equal on both sides.  No rhonchi.  No rales.   Cardiac: Heart sounds are normal.  No pericardial rub.  No murmur. Lymphatic system: Cervical, axillary, inguinal, lymph nodes not palpable GI: Abdomen is soft.  No ascites.  Liver spleen not palpable.  No tenderness.  Bowel sounds are within normal limit Lower extremity:Patient has ulceration which is traumatic but gradually getting worse.  Redness around it  Neurologically, the patient was awake, alert, and oriented to person, place and time. There were no obvious focal neurologic abnormalities.ct No evidence of peripheral neuropathy. Examination of the skin revealed no evidence of significant rashes, suspicious appearing nevi or other concerning lesions.     LAB RESULTS:  No visits with results within 3 Day(s) from this visit. Latest known visit with results is:  Appointment on 02/02/2015  Component Date Value Ref Range Status  . WBC 02/02/2015 2.1* 3.6 -  11.0 K/uL Final  . RBC 02/02/2015 4.16  3.80 - 5.20 MIL/uL Final  . Hemoglobin 02/02/2015 12.9  12.0 - 16.0 g/dL Final  . HCT 02/02/2015 38.7  35.0 - 47.0 % Final  . MCV 02/02/2015 93.0  80.0 - 100.0 fL Final  . MCH 02/02/2015 31.1  26.0 - 34.0 pg Final  . MCHC 02/02/2015 33.4  32.0 - 36.0 g/dL Final  . RDW 02/02/2015 16.3* 11.5 - 14.5 % Final  . Platelets 02/02/2015 296  150 - 440 K/uL Final  . Neutrophils Relative % 02/02/2015 26   Final  . Neutro Abs 02/02/2015 0.5* 1.4 - 6.5 K/uL Final  . Lymphocytes Relative 02/02/2015 26   Final  . Lymphs Abs 02/02/2015 0.5* 1.0 - 3.6 K/uL Final  . Monocytes Relative 02/02/2015 44   Final  . Monocytes Absolute 02/02/2015 0.9  0.2 - 0.9 K/uL Final  . Eosinophils Relative 02/02/2015 3   Final  . Eosinophils Absolute 02/02/2015 0.1  0 - 0.7 K/uL Final  . Basophils Relative 02/02/2015 1   Final  . Basophils Absolute 02/02/2015 0.0  0 - 0.1 K/uL Final  . Sodium 02/02/2015 134* 135 - 145 mmol/L Final  . Potassium 02/02/2015 3.3* 3.5 - 5.1 mmol/L Final  . Chloride 02/02/2015 102  101 - 111  mmol/L Final  . CO2 02/02/2015 28  22 - 32 mmol/L Final  . Glucose, Bld 02/02/2015 96  65 - 99 mg/dL Final  . BUN 02/02/2015 24* 6 - 20 mg/dL Final  . Creatinine, Ser 02/02/2015 0.78  0.44 - 1.00 mg/dL Final  . Calcium 02/02/2015 8.3* 8.9 - 10.3 mg/dL Final  . Total Protein 02/02/2015 6.6  6.5 - 8.1 g/dL Final  . Albumin 02/02/2015 3.8  3.5 - 5.0 g/dL Final  . AST 02/02/2015 18  15 - 41 U/L Final  . ALT 02/02/2015 15  14 - 54 U/L Final  . Alkaline Phosphatase 02/02/2015 68  38 - 126 U/L Final  . Total Bilirubin 02/02/2015 0.7  0.3 - 1.2 mg/dL Final  . GFR calc non Af Amer 02/02/2015 >60  >60 mL/min Final  . GFR calc Af Amer 02/02/2015 >60  >60 mL/min Final   Comment: (NOTE) The eGFR has been calculated using the CKD EPI equation. This calculation has not been validated in all clinical situations. eGFR's persistently <60 mL/min signify possible Chronic Kidney Disease.   . Anion gap 02/02/2015 4* 5 - 15 Final  . Kappa free light chain 02/02/2015 16.25  3.30 - 19.40 mg/L Final  . Lamda free light chains 02/02/2015 9.68  5.71 - 26.30 mg/L Final  . Kappa, lamda light chain ratio 02/02/2015 1.68* 0.26 - 1.65 Final   Comment: (NOTE) Performed At: [facility] [place], [place] [id number] Lindon Romp MD VK:[id number]   . Total Protein ELP 02/02/2015 6.0  6.0 - 8.5 g/dL Final  . Albumin ELP 02/02/2015 3.4  2.9 - 4.4 g/dL Final  . Alpha-1-Globulin 02/02/2015 0.2  0.0 - 0.4 g/dL Final  . Alpha-2-Globulin 02/02/2015 0.7  0.4 - 1.0 g/dL Final  . Beta Globulin 02/02/2015 1.0  0.7 - 1.3 g/dL Final  . Gamma Globulin 02/02/2015 0.7  0.4 - 1.8 g/dL Final  . M-Spike, % 02/02/2015 0.5* Not Observed g/dL Final  . SPE Interp. 02/02/2015 Comment   Final   Comment: (NOTE) The SPE pattern demonstrates a single peak (M-spike) in the gamma region which may represent monoclonal protein. This peak may also be caused by circulating  immune complexes, cryoglobulins,  C-reactive protein, fibrinogen or hemolysis.  If clinically indicated, the presence of a monoclonal gammopathy may be confirmed by immuno- fixation, as well as an evaluation of the urine for the presence of Bence-Jones protein. Performed At: [facility] [place], [place] [id number] Lindon Romp MD HY:[id number]   . Comment 02/02/2015 Comment   Final   Comment: (NOTE) Protein electrophoresis scan will follow via computer, mail, or courier delivery.   Marland Kitchen GLOBULIN, TOTAL 02/02/2015 2.6  2.2 - 3.9 g/dL Corrected  . A/G Ratio 02/02/2015 1.3  0.7 - 1.7 Corrected    Diagnosis Skin , right arm INVASIVE SQUAMOUS CELL CARCINOMA (KERATOACANTHOMA TYPE). MARGINS OF RESECTION ARE NEGATIVE. Casimer Lanius MD Pathologist, Electronic Signature (Case signed 01/20/2015)  STUDIES: ctrophoresis, serum  Status: EditedResult-FINAL Visible to patient:  Not Released Nextappt: 03/01/2015 at 11:00 AM in Oncology ([facility]) Dx:  Multiple myeloma in relapse              Ref Range 1d ago  36moago  240mogo     Total Protein ELP 6.0 - 8.5 g/dL 6.0 6.3 6.3    Albumin ELP 2.9 - 4.4 g/dL 3.4 3.5 3.6    Alpha-1-Globulin 0.0 - 0.4 g/dL 0.2 0.2 0.2    Alpha-2-Globulin 0.4 - 1.0 g/dL 0.7 0.6 0.6    Beta Globulin 0.7 - 1.3 g/dL 1.0 1.0 1.0    Gamma Globulin 0.4 - 1.8 g/dL 0.7 0.9 0.9    M-Spike, % Not Observed g/dL 0.5 (H) 0.6 0.6         Kappa/lambda light chains (Order [id number]     Kappa/lambda light chains  Status: Finalresult Visible to patient:  Not Released Nextappt: 03/01/2015 at 11:00 AM in Oncology ([facility]) Dx:  Multiple myeloma in relapse              Ref Range 1d ago  34m267moo  62mo80mo  67mo 567mo    Kappa free light chain 3.30 - 19.40 mg/L 16.25 16.04 18.31 14.97    Lamda free light chains 5.71 - 26.30 mg/L 9.68 11.21 11.72 11.56    Kappa, lamda light chain ratio 0.26 - 1.65  1.68 (H) 1.43CM 1.56CM  1.29CM         ASSESSMENT: Multiple myeloma in relapse.  Patient had a myelosuppression secondary to POMOLIDOMIDE .  Myelosuppression is resolved. We will proceed with pomolidomide and Decadron Patient would be off, livedo might the white count improves.  Then will start 2 weeks on and 2 weeks program SIEP shows slightly increasing M spike and will be followed light chain is within acceptable range    MEDICAL DECISION MAKING:  All lab data including SIEP and light chain has been reviewed Continuing POMOLID and Decadron with reduced dose Osteonecrosis of jaw bone is stable  2.  Acute visit for nonhealing ulcers which to be keen where traumatic ulcers Patient was referred to surgical service Keflex 500 mg 3 times a day for 7 days Patient was myelosuppressive but now improved OFF pomolidomide   No matching staging information was found for the patient.  JanakForest Gleason  02/15/2015 3:05 PM

## 2015-02-21 ENCOUNTER — Ambulatory Visit (INDEPENDENT_AMBULATORY_CARE_PROVIDER_SITE_OTHER): Payer: Medicare Other | Admitting: General Surgery

## 2015-02-21 ENCOUNTER — Encounter: Payer: Self-pay | Admitting: General Surgery

## 2015-02-21 ENCOUNTER — Ambulatory Visit: Payer: Medicare Other | Admitting: General Surgery

## 2015-02-21 VITALS — BP 158/80 | HR 62 | Resp 16 | Ht 62.0 in | Wt 102.0 lb

## 2015-02-21 DIAGNOSIS — L97911 Non-pressure chronic ulcer of unspecified part of right lower leg limited to breakdown of skin: Secondary | ICD-10-CM | POA: Diagnosis not present

## 2015-02-21 DIAGNOSIS — L97921 Non-pressure chronic ulcer of unspecified part of left lower leg limited to breakdown of skin: Secondary | ICD-10-CM

## 2015-02-21 MED ORDER — SILVER SULFADIAZINE 1 % EX CREA: TOPICAL_CREAM | CUTANEOUS | Status: DC

## 2015-02-21 NOTE — Progress Notes (Signed)
Patient ID: [name], female   DOB: 06-Dec-1924, 81 y.o.   MRN: [id number]  Chief Complaint  Patient presents with  . Follow-up    bilateral leg wounds    HPI [name] is a 81 y.o. female.  Here today for evaluation of bilateral leg wounds. She states she fell about 5 weeks ago in the yard over a yard decoration. She states it cut both lower legs the areas have not healed. She states the left lower leg is very tender/painful. Sh has been using Desonide 0.05%. History of multiple myeloma.   HPI  Past Medical History  Diagnosis Date  . Osteonecrosis of jaw     left  . Anxiety   . Coronary arteriosclerosis   . Hypertension   . Hyperlipidemia   . Arthritis   . Atrial fibrillation   . Multiple myeloma   . Multiple myeloma in relapse 10/29/2014  . History of blood transfusion 2014    Past Surgical History  Procedure Laterality Date  . Appendectomy    . Abdominal hysterectomy    . Total knee revision Left   . Partial colectomy      Family History  Problem Relation Age of Onset  . Cancer Father   . Heart attack Mother     Social History Social History  Substance Use Topics  . Smoking status: Never Smoker   . Smokeless tobacco: Never Used  . Alcohol Use: 1.2 oz/week    2 Glasses of wine per week    Allergies  Allergen Reactions  . Biaxin [Clarithromycin] Diarrhea and Nausea And Vomiting  . Demerol [Meperidine] Nausea And Vomiting  . Hydrocodone Nausea And Vomiting  . Sulfa Antibiotics Hives  . Tramadol Diarrhea and Nausea And Vomiting  . Ultracet [Tramadol-Acetaminophen] Other (See Comments)    Current Outpatient Prescriptions  Medication Sig Dispense Refill  . aspirin 81 MG chewable tablet Chew 81 mg by mouth daily.    . Biotin 1 MG CAPS Take by mouth.    . Dexamethasone (DECADRON PO) Take by mouth as directed. With pomalyst    . fluticasone (FLONASE) 50 MCG/ACT nasal spray Place 1 spray into both nostrils daily. 16 g 0  . ibuprofen  (ADVIL,MOTRIN) 800 MG tablet Take 1 tablet (800 mg total) by mouth every 8 (eight) hours as needed for moderate pain. 30 tablet 3  . silver sulfADIAZINE (SILVADENE) 1 % cream Apply to affected area daily 50 g 1   No current facility-administered medications for this visit.    Review of Systems Review of Systems  Constitutional: Negative.   Respiratory: Negative.   Cardiovascular: Negative.     Blood pressure 158/80, pulse 62, resp. rate 16, height '5\' 2"'  (1.575 m), weight 102 lb (46.267 kg).  Physical Exam Physical Exam  Constitutional: She is oriented to person, place, and time. She appears well-developed and well-nourished.  HENT:  Mouth/Throat: Oropharynx is clear and moist.  Eyes: Conjunctivae are normal. No scleral icterus.  Cardiovascular:  Pulses:      Dorsalis pedis pulses are 2+ on the right side, and 2+ on the left side.  Neurological: She is alert and oriented to person, place, and time.  Skin: Skin is warm and dry.  1 1.5 cm irregular superficial bilateral leg ulcers overlying anterior border tibia, no sing infection, base fibrinous tissue.  Psychiatric: Her behavior is normal.    Data Reviewed Progress notes.  Assessment    Bilateral superficial leg ulcers    Plan  Silvadene cream, apply daily. Pt instructed on this Follow up in 10 days.     PCP:  [name] Ref Dr [name]   [name] 02/21/2015, 1:05 PM

## 2015-02-21 NOTE — Patient Instructions (Signed)
The patient is aware to call back for any questions or concerns.  

## 2015-02-24 ENCOUNTER — Other Ambulatory Visit: Payer: Self-pay | Admitting: *Deleted

## 2015-02-24 DIAGNOSIS — C9002 Multiple myeloma in relapse: Secondary | ICD-10-CM

## 2015-03-01 ENCOUNTER — Inpatient Hospital Stay (HOSPITAL_BASED_OUTPATIENT_CLINIC_OR_DEPARTMENT_OTHER): Payer: Medicare Other | Admitting: Oncology

## 2015-03-01 ENCOUNTER — Encounter: Payer: Self-pay | Admitting: Oncology

## 2015-03-01 ENCOUNTER — Inpatient Hospital Stay: Payer: Medicare Other

## 2015-03-01 ENCOUNTER — Ambulatory Visit: Payer: Medicare Other

## 2015-03-01 VITALS — BP 176/90 | HR 67 | Temp 96.3°F | Wt 103.0 lb

## 2015-03-01 DIAGNOSIS — E785 Hyperlipidemia, unspecified: Secondary | ICD-10-CM

## 2015-03-01 DIAGNOSIS — I4891 Unspecified atrial fibrillation: Secondary | ICD-10-CM

## 2015-03-01 DIAGNOSIS — Z23 Encounter for immunization: Secondary | ICD-10-CM | POA: Diagnosis not present

## 2015-03-01 DIAGNOSIS — T50995S Adverse effect of other drugs, medicaments and biological substances, sequela: Secondary | ICD-10-CM | POA: Diagnosis not present

## 2015-03-01 DIAGNOSIS — I2510 Atherosclerotic heart disease of native coronary artery without angina pectoris: Secondary | ICD-10-CM

## 2015-03-01 DIAGNOSIS — L03115 Cellulitis of right lower limb: Secondary | ICD-10-CM | POA: Diagnosis not present

## 2015-03-01 DIAGNOSIS — C9002 Multiple myeloma in relapse: Secondary | ICD-10-CM | POA: Diagnosis not present

## 2015-03-01 DIAGNOSIS — L03116 Cellulitis of left lower limb: Secondary | ICD-10-CM

## 2015-03-01 DIAGNOSIS — Z79899 Other long term (current) drug therapy: Secondary | ICD-10-CM

## 2015-03-01 DIAGNOSIS — M1990 Unspecified osteoarthritis, unspecified site: Secondary | ICD-10-CM

## 2015-03-01 DIAGNOSIS — M87180 Osteonecrosis due to drugs, jaw: Secondary | ICD-10-CM | POA: Diagnosis not present

## 2015-03-01 DIAGNOSIS — R2689 Other abnormalities of gait and mobility: Secondary | ICD-10-CM

## 2015-03-01 DIAGNOSIS — I10 Essential (primary) hypertension: Secondary | ICD-10-CM

## 2015-03-01 LAB — COMPREHENSIVE METABOLIC PANEL
ALK PHOS: 87 U/L (ref 38–126)
ALT: 12 U/L — AB (ref 14–54)
AST: 19 U/L (ref 15–41)
Albumin: 3.8 g/dL (ref 3.5–5.0)
Anion gap: 11 (ref 5–15)
BILIRUBIN TOTAL: 0.6 mg/dL (ref 0.3–1.2)
BUN: 17 mg/dL (ref 6–20)
CALCIUM: 8.6 mg/dL — AB (ref 8.9–10.3)
CO2: 24 mmol/L (ref 22–32)
CREATININE: 0.78 mg/dL (ref 0.44–1.00)
Chloride: 104 mmol/L (ref 101–111)
GFR calc Af Amer: >60 mL/min (ref >60)
Glucose, Bld: 74 mg/dL (ref 65–99)
POTASSIUM: 3.8 mmol/L (ref 3.5–5.1)
Sodium: 139 mmol/L (ref 135–145)
TOTAL PROTEIN: 6.9 g/dL (ref 6.5–8.1)

## 2015-03-01 LAB — CBC WITH DIFFERENTIAL/PLATELET
BASOS ABS: 0.1 10*3/uL (ref 0–0.1)
Basophils Relative: 3 %
Eosinophils Absolute: 0.1 10*3/uL (ref 0–0.7)
Eosinophils Relative: 3 %
HEMATOCRIT: 40.2 % (ref 35.0–47.0)
Hemoglobin: 13.5 g/dL (ref 12.0–16.0)
LYMPHS PCT: 14 %
Lymphs Abs: 0.5 10*3/uL — ABNORMAL LOW (ref 1.0–3.6)
MCH: 32 pg (ref 26.0–34.0)
MCHC: 33.6 g/dL (ref 32.0–36.0)
MCV: 95.3 fL (ref 80.0–100.0)
MONO ABS: 0.3 10*3/uL (ref 0.2–0.9)
Monocytes Relative: 9 %
NEUTROS ABS: 2.6 10*3/uL (ref 1.4–6.5)
Neutrophils Relative %: 71 %
Platelets: 290 10*3/uL (ref 150–440)
RBC: 4.22 MIL/uL (ref 3.80–5.20)
RDW: 16.4 % — AB (ref 11.5–14.5)
WBC: 3.7 10*3/uL (ref 3.6–11.0)

## 2015-03-01 MED ORDER — SODIUM CHLORIDE 0.9 % IJ SOLN
10.0000 mL | INTRAMUSCULAR | Status: AC | PRN
  Administered 2015-03-01: 10 mL via INTRAVENOUS
  Filled 2015-03-01: qty 10

## 2015-03-01 MED ORDER — INFLUENZA VAC SPLIT QUAD 0.5 ML IM SUSY
0.5000 mL | PREFILLED_SYRINGE | Freq: Once | INTRAMUSCULAR | Status: AC
  Administered 2015-03-01: 0.5 mL via INTRAMUSCULAR

## 2015-03-01 MED ORDER — HEPARIN SOD (PORK) LOCK FLUSH 100 UNIT/ML IV SOLN
500.0000 [IU] | Freq: Once | INTRAVENOUS | Status: AC | PRN
  Administered 2015-03-01: 500 [IU] via INTRAVENOUS

## 2015-03-01 MED ORDER — HYDROCODONE-ACETAMINOPHEN 5-325 MG PO TABS: 1.0000 | ORAL_TABLET | Freq: Once | ORAL | Status: DC

## 2015-03-01 NOTE — Progress Notes (Signed)
Patient does have living will.  Former smoker.  Patient here today for follow up regarding leg pain due to injury.  Saw Dr. Jamal Collin and has been using Silvadene cream on bilateral legs.  Patient states she is still having severe pain.

## 2015-03-01 NOTE — Progress Notes (Signed)
[place] @ [facility] Telephone:([phone]  Fax:(336) [place] OB: January 04, 1925  MR#: [id number]  JKD#:[id number]  Patient Care Team: Derinda Late, MD as PCP - General (Family Medicine) Forest Gleason, MD (Oncology) Seeplaputhur Robinette Haines, MD (General Surgery)  CHIEF COMPLAINT:  Multiple myeloma on  POMOLIDOMIDE Osteonecrosis of jaw bone pain secondary to biphosphonate therapy with Zometa     No flowsheet data found.  INTERVAL HISTORY:  81 year old lady came today further follow-up for assessment of multiple myeloma. Patient also has a nonhealing ulcer in both submitted lower extremity which is being evaluated by surgeon.  Patient complains of significant pain in the left lower extremity. No chills.  No fever.  No nausea.  No vomiting. Patient also had a recent evaluation regarding squamous cell carcinoma and right upper extremity. Patient was myelosuppressed  secondary to thalidomide therapy No chills.  No fever.   REVIEW OF SYSTEMS:   Gen. status: Performance status is 2.  Patient is feeling better.  Appetite is improving.  HEENT: Patient has osteonecrosis of jaw bone.  Swelling on the left side of the neck is improved.  Lungs: Shortness of breath on exertion.  Cardiac: No chest pain no palpitation.  GI: Appetite is getting better no nausea no vomiting no diarrhea or rectal bleeding.  GU: No dysuria or hematuria.  Neurological system: Continues to no problem with maintaining balance.  No tingling.  No numbness.  Lower extremity no swellings.  Skin: No rash.  No skeletal system joint pains continue to bother patient perstatus is improving. Swelling in the left jaw has been All other systems have been reviewed O significant family history  As per HPI. Otherwise, a complete review of systems is negatve.  PAST MEDICAL HISTORY: Past Medical History  Diagnosis Date  . Osteonecrosis of jaw     left  . Anxiety   . Coronary arteriosclerosis   . Hypertension    . Hyperlipidemia   . Arthritis   . Atrial fibrillation   . Multiple myeloma   . Multiple myeloma in relapse 10/29/2014  . History of blood transfusion 2014    PAST SURGICAL HISTORY: Past Surgical History  Procedure Laterality Date  . Appendectomy    . Abdominal hysterectomy    . Total knee revision Left   . 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 Patient is postmenopausal.     ADVANCED DIRECTIVES:    HEALTH MAINTENANCE: Social History  Substance Use Topics  . Smoking status: Never Smoker   . Smokeless tobacco: Never Used  . Alcohol Use: 1.2 oz/week    2 Glasses of wine per week      Allergies  Allergen Reactions  . Biaxin [Clarithromycin] Diarrhea and Nausea And Vomiting  . Demerol [Meperidine] Nausea And Vomiting  . Hydrocodone Nausea And Vomiting  . Sulfa Antibiotics Hives  . Tramadol Diarrhea and Nausea And Vomiting  . Ultracet [Tramadol-Acetaminophen] Other (See Comments)    Current Outpatient Prescriptions  Medication Sig Dispense Refill  . aspirin 81  MG chewable tablet Chew 81 mg by mouth daily.    . Biotin 1 MG CAPS Take by mouth.    . Dexamethasone (DECADRON PO) Take by mouth as directed. With pomalyst    . fluticasone (FLONASE) 50 MCG/ACT nasal spray Place 1 spray into both nostrils daily. 16 g 0  . ibuprofen (ADVIL,MOTRIN) 800 MG tablet Take 1 tablet (800 mg total) by mouth every 8 (eight) hours as needed for moderate pain. 30 tablet 3  . silver sulfADIAZINE (SILVADENE) 1 % cream Apply to affected area daily 50 g 1  .  cephALEXin (KEFLEX) 500 MG capsule      No current facility-administered medications for this visit.    OBJECTIVE:  Filed Vitals:   03/01/15 1137  BP: 176/90  Pulse: 67  Temp: 96.3 F (35.7 C)     Body mass index is 18.83 kg/(m^2).    ECOG FS: 2  PHYSICAL EXAM: GENERAL  status: Performance status is 2  Patient has not lost significant weight Walking with the help of walker. HEENT: No evidence of stomatitis.  No swelling on the left side of the neck.  Osteonecrosis of jaw bone is stable.  No evidence of infection.   Sclera and conjunctivae :: No jaundice.   pale looking . Lungs: Air  entry equal on both sides.  No rhonchi.  No rales.  Cardiac: Heart sounds are normal.  No pericardial rub.  No murmur. Lymphatic system: Cervical, axillary, inguinal, lymph nodes not palpable GI: Abdomen is soft.  No ascites.  Liver spleen not palpable.  No tenderness.  Bowel sounds are within normal limit Lower extremity:Patient has ulceration which is traumatic but gradually getting worse.  Redness around it  Neurologically, the patient was awake, alert, and oriented to person, place and time. There were no obvious focal neurologic abnormalities.ct No evidence of peripheral neuropathy. Examination of the skin revealed no evidence of significant rashes, suspicious appearing nevi or other concerning lesions.     LAB RESULTS: March 01, 2015  WBC 3.7 Neutrophil count 2.6 Platelet count 290  Review of chemistry lab data is been reported to be negative Iron lambda light chain are within acceptable range M spike is 0.5  Diagnosis Skin , right arm INVASIVE SQUAMOUS CELL CARCINOMA (KERATOACANTHOMA TYPE). MARGINS OF RESECTION ARE NEGATIVE. Casimer Lanius MD Pathologist, Electronic Signature (Case signed 01/20/2015)  STUDIES: ctrophoresis, serum  Status: EditedResult-FINAL Visible to patient:  Not Released Nextappt: 03/01/2015 at 11:00 AM in Oncology ([facility]) Dx:  Multiple  myeloma in relapse              Ref Range 1d ago  59moago  223mogo     Total Protein ELP 6.0 - 8.5 g/dL 6.0 6.3 6.3    Albumin ELP 2.9 - 4.4 g/dL 3.4 3.5 3.6    Alpha-1-Globulin 0.0 - 0.4 g/dL 0.2 0.2 0.2    Alpha-2-Globulin 0.4 - 1.0 g/dL 0.7 0.6 0.6    Beta Globulin 0.7 - 1.3 g/dL 1.0 1.0 1.0    Gamma Globulin 0.4 - 1.8 g/dL 0.7 0.9 0.9    M-Spike, % Not Observed g/dL 0.5 (H) 0.6 0.6         Kappa/lambda light chains (Order [id number]     Kappa/lambda light chains  Status: Finalresult Visible to patient:  Not Released Nextappt: 03/01/2015 at 11:00 AM in Oncology ([facility]) Dx:  Multiple myeloma in relapse              Ref Range 1d  ago  38moago  272mogo  78m44moo     Kappa free light chain 3.30 - 19.40 mg/L 16.25 16.04 18.31 14.97    Lamda free light chains 5.71 - 26.30 mg/L 9.68 11.21 11.72 11.56    Kappa, lamda light chain ratio 0.26 - 1.65  1.68 (H) 1.43CM 1.56CM 1.29CM         ASSESSMENT: Multiple myeloma in relaps  Stable disease with stable M spike.  On pomolidomide Finney C cup this cycle.  Patient was advised to not to start new cycle until also seen of both lower extremity completely heals 2.  Traumatic nonhealing ulcer in both lower extremity being followed by surgeon left extremity is significant pain.  Pain medication with hydrocodone was given._0 3.  Osteonecrosis of jaw bone stable Patient was reassured about traumatic ulcer that it is nothing to do with multiple myeloma as patient is extremely worried about bone involvement       No matching staging information was found for the patient.  JanForest GleasonD   03/01/2015 12:02 PM

## 2015-03-02 ENCOUNTER — Ambulatory Visit: Payer: Medicare Other

## 2015-03-02 ENCOUNTER — Other Ambulatory Visit: Payer: Medicare Other

## 2015-03-02 ENCOUNTER — Ambulatory Visit: Payer: Medicare Other | Admitting: Oncology

## 2015-03-02 LAB — MULTIPLE MYELOMA PANEL, SERUM
ALBUMIN SERPL ELPH-MCNC: 3.4 g/dL (ref 2.9–4.4)
ALPHA 1: 0.3 g/dL (ref 0.0–0.4)
Albumin/Glob SerPl: 1.3 (ref 0.7–1.7)
Alpha2 Glob SerPl Elph-Mcnc: 0.7 g/dL (ref 0.4–1.0)
B-Globulin SerPl Elph-Mcnc: 1.1 g/dL (ref 0.7–1.3)
GLOBULIN, TOTAL: 2.8 g/dL (ref 2.2–3.9)
Gamma Glob SerPl Elph-Mcnc: 0.7 g/dL (ref 0.4–1.8)
IGA: 64 mg/dL (ref 64–422)
IGM, SERUM: 64 mg/dL (ref 26–217)
IgG (Immunoglobin G), Serum: 749 mg/dL (ref 700–1600)
M Protein SerPl Elph-Mcnc: 0.4 g/dL — ABNORMAL HIGH
Total Protein ELP: 6.2 g/dL (ref 6.0–8.5)

## 2015-03-02 LAB — KAPPA/LAMBDA LIGHT CHAINS
KAPPA, LAMDA LIGHT CHAIN RATIO: 1.44 (ref 0.26–1.65)
Kappa free light chain: 14.23 mg/L (ref 3.30–19.40)
LAMDA FREE LIGHT CHAINS: 9.9 mg/L (ref 5.71–26.30)

## 2015-03-03 LAB — PROTEIN ELECTROPHORESIS, SERUM
A/G RATIO SPE: 1.2 (ref 0.7–1.7)
ALBUMIN ELP: 3.5 g/dL (ref 2.9–4.4)
Alpha-1-Globulin: 0.3 g/dL (ref 0.0–0.4)
Alpha-2-Globulin: 0.8 g/dL (ref 0.4–1.0)
Beta Globulin: 1.1 g/dL (ref 0.7–1.3)
GAMMA GLOBULIN: 0.8 g/dL (ref 0.4–1.8)
Globulin, Total: 2.9 g/dL (ref 2.2–3.9)
M-Spike, %: 0.4 g/dL — ABNORMAL HIGH
TOTAL PROTEIN ELP: 6.4 g/dL (ref 6.0–8.5)

## 2015-03-07 ENCOUNTER — Telehealth: Payer: Self-pay | Admitting: *Deleted

## 2015-03-07 ENCOUNTER — Encounter: Payer: Self-pay | Admitting: General Surgery

## 2015-03-07 ENCOUNTER — Other Ambulatory Visit: Payer: Self-pay | Admitting: *Deleted

## 2015-03-07 ENCOUNTER — Ambulatory Visit (INDEPENDENT_AMBULATORY_CARE_PROVIDER_SITE_OTHER): Payer: Medicare Other | Admitting: General Surgery

## 2015-03-07 VITALS — BP 130/60 | HR 88 | Resp 14 | Ht 62.0 in | Wt 102.8 lb

## 2015-03-07 DIAGNOSIS — L97911 Non-pressure chronic ulcer of unspecified part of right lower leg limited to breakdown of skin: Secondary | ICD-10-CM | POA: Diagnosis not present

## 2015-03-07 DIAGNOSIS — L97921 Non-pressure chronic ulcer of unspecified part of left lower leg limited to breakdown of skin: Secondary | ICD-10-CM

## 2015-03-07 DIAGNOSIS — C9002 Multiple myeloma in relapse: Secondary | ICD-10-CM

## 2015-03-07 NOTE — Telephone Encounter (Signed)
Patient states she was told to get an x-ray on her leg but was not told where or when to go.  Please advise.   Spoke with Angie Fava, RN for Dr. Oliva Bustard - order put in today for X-ray.  Scheduler to call patient with appt.  Called patient and informed her that scheduler would be calling with appointment date/time. Patient verbalized understanding.

## 2015-03-07 NOTE — Progress Notes (Signed)
She is here today for follow up lower leg wounds. She states the area looks better but is still very painful. She has been using the silvadene cream twice a day. She never had xrays done on the legs since her injury.  Wounds on anterior lower tibial surface are improving.   Right: 1.5 cm, dry and starting to scab  Left: 1 cm, superficial and clean  Still complaining of a lot of pain, especially over the left leg ulcer. Likely nerve involvement is causing her extreme pain.  Silvadene and new dressings today in clinic. Prescription of Vicodin today. Consulted with Dr. Oliva Bustard, who will arrange xrays.  Follow up 2 weeks.  PCP:  Derinda Late

## 2015-03-07 NOTE — Patient Instructions (Addendum)
Call back for any questions or concerns. Continue applying silvadene cream twice a day. Dr. Metro Kung office will contact you about xrays. If you do not hear from them by tomorrow, you can call his office. Follow-up with Dr. Jamal Collin in 2 weeks.

## 2015-03-08 ENCOUNTER — Ambulatory Visit: Payer: Medicare Other

## 2015-03-08 ENCOUNTER — Encounter: Payer: Self-pay | Admitting: Emergency Medicine

## 2015-03-08 ENCOUNTER — Ambulatory Visit
Admission: EM | Admit: 2015-03-08 | Discharge: 2015-03-08 | Disposition: A | Discharge status: Home / Self Care | Payer: Medicare Other | Attending: Family Medicine | Admitting: Family Medicine

## 2015-03-08 DIAGNOSIS — T148 Other injury of unspecified body region: Secondary | ICD-10-CM | POA: Diagnosis not present

## 2015-03-08 DIAGNOSIS — Z7982 Long term (current) use of aspirin: Secondary | ICD-10-CM | POA: Insufficient documentation

## 2015-03-08 DIAGNOSIS — F419 Anxiety disorder, unspecified: Secondary | ICD-10-CM | POA: Insufficient documentation

## 2015-03-08 DIAGNOSIS — I4891 Unspecified atrial fibrillation: Secondary | ICD-10-CM | POA: Diagnosis not present

## 2015-03-08 DIAGNOSIS — S81802A Unspecified open wound, left lower leg, initial encounter: Secondary | ICD-10-CM | POA: Insufficient documentation

## 2015-03-08 DIAGNOSIS — E785 Hyperlipidemia, unspecified: Secondary | ICD-10-CM | POA: Diagnosis not present

## 2015-03-08 DIAGNOSIS — S81801A Unspecified open wound, right lower leg, initial encounter: Secondary | ICD-10-CM | POA: Insufficient documentation

## 2015-03-08 DIAGNOSIS — C9002 Multiple myeloma in relapse: Secondary | ICD-10-CM | POA: Insufficient documentation

## 2015-03-08 DIAGNOSIS — W19XXXA Unspecified fall, initial encounter: Secondary | ICD-10-CM | POA: Diagnosis not present

## 2015-03-08 DIAGNOSIS — T148XXA Other injury of unspecified body region, initial encounter: Secondary | ICD-10-CM

## 2015-03-08 DIAGNOSIS — I2510 Atherosclerotic heart disease of native coronary artery without angina pectoris: Secondary | ICD-10-CM | POA: Diagnosis not present

## 2015-03-08 DIAGNOSIS — I10 Essential (primary) hypertension: Secondary | ICD-10-CM | POA: Insufficient documentation

## 2015-03-08 DIAGNOSIS — M79662 Pain in left lower leg: Secondary | ICD-10-CM | POA: Diagnosis not present

## 2015-03-08 DIAGNOSIS — M79661 Pain in right lower leg: Secondary | ICD-10-CM | POA: Diagnosis not present

## 2015-03-08 MED ORDER — DOXYCYCLINE HYCLATE 100 MG PO CAPS
100.0000 mg | ORAL_CAPSULE | Freq: Two times a day (BID) | ORAL | Status: DC
Start: 2015-03-08 — End: 2015-03-29

## 2015-03-08 MED ORDER — MUPIROCIN 2 % EX OINT: 1.0000 "application " | TOPICAL_OINTMENT | Freq: Three times a day (TID) | CUTANEOUS | Status: DC

## 2015-03-08 NOTE — ED Notes (Signed)
Pt fell x 7  Weeks ago hitting lower legs still having pain

## 2015-03-08 NOTE — Discharge Instructions (Signed)
Wound Infection °A wound infection happens when a type of germ (bacteria) starts growing in the wound. In some cases, this can cause the wound to break open. If cared for properly, the infected wound will heal from the inside to the outside. Wound infections need treatment. °CAUSES °An infection is caused by bacteria growing in the wound.  °SYMPTOMS  °· Increase in redness, swelling, or pain at the wound site. °· Increase in drainage at the wound site. °· Wound or bandage (dressing) starts to smell bad. °· Fever. °· Feeling tired or fatigued. °· Pus draining from the wound. °TREATMENT  °Your health care provider will prescribe antibiotic medicine. The wound infection should improve within 24 to 48 hours. Any redness around the wound should stop spreading and the wound should be less painful.  °HOME CARE INSTRUCTIONS  °· Only take over-the-counter or prescription medicines for pain, discomfort, or fever as directed by your health care provider. °· Take your antibiotics as directed. Finish them even if you start to feel better. °· Gently wash the area with mild soap and water 2 times a day, or as directed. Rinse off the soap. Pat the area dry with a clean towel. Do not rub the wound. This may cause bleeding. °· Follow your health care provider's instructions for how often you need to change the dressing. °· Apply ointment and a dressing to the wound as directed. °· If the dressing sticks, moisten it with soapy water and gently remove it. °· Change the bandage right away if it becomes wet, dirty, or develops a bad smell. °· Take showers. Do not take tub baths, swim, or do anything that may soak the wound until it is healed. °· Avoid exercises that make you sweat heavily. °· Use anti-itch medicine as directed by your health care provider. The wound may itch when it is healing. Do not pick or scratch at the wound. °· Follow up with your health care provider to get your wound rechecked as directed. °SEEK MEDICAL CARE  IF: °· You have an increase in swelling, pain, or redness around the wound. °· You have an increase in the amount of pus coming from the wound. °· There is a bad smell coming from the wound. °· More of the wound breaks open. °· You have a fever. °MAKE SURE YOU:  °· Understand these instructions. °· Will watch your condition. °· Will get help right away if you are not doing well or get worse. °Document Released: 02/23/2003 Document Revised: 06/01/2013 Document Reviewed: 09/30/2010 °[employer]® Patient Information ©2015 [employer]. This information is not intended to replace advice given to you by your health care provider. Make sure you discuss any questions you have with your health care provider. ° °

## 2015-03-08 NOTE — ED Provider Notes (Signed)
CSN: [id number]     Arrival date & time 03/08/15  1552 History   First MD Initiated Contact with Patient 03/08/15 1639     Chief Complaint  Patient presents with  . Leg Injury   (Consider location/radiation/quality/duration/timing/severity/associated sxs/prior Treatment) HPI  This an 81 year old female who fell against a iron bar in her yard hitting her lower anterior shins. Since that time she's had an open sore on both legs and has been progressively worsening and been coming more painful and tender. She is a cancer patient and she has both bone and blood cancer being treated on chemotherapy type drugs and having a difficult time maintaining her WBC count. After 7 weeks she still has bilateral nonhealing wounds over her anterior tibia. She's been applying Silvadene and covering with a dry gauze bandage. Is recommended that she have x-rays so she came here today. States she's been in misery for the 7 weeks but today is actually the first day that she is actually felt better. The right wound appears to be healing better than the left despite her diligence in her home wound care performed twice a day. She has no history of peripheral vascular disease. Past Medical History  Diagnosis Date  . Osteonecrosis of jaw     left  . Anxiety   . Coronary arteriosclerosis   . Hypertension   . Hyperlipidemia   . Arthritis   . Atrial fibrillation   . Multiple myeloma   . Multiple myeloma in relapse 10/29/2014  . History of blood transfusion 2014   Past Surgical History  Procedure Laterality Date  . Appendectomy    . Abdominal hysterectomy    . Total knee revision Left   . Partial colectomy     Family History  Problem Relation Age of Onset  . Cancer Father   . Heart attack Mother    Social History  Substance Use Topics  . Smoking status: Never Smoker   . Smokeless tobacco: Never Used  . Alcohol Use: 1.2 oz/week    2 Glasses of wine per week   OB History    Gravida Para Term Preterm AB TAB  SAB Ectopic Multiple Living   2         2     Review of Systems  Constitutional: Positive for activity change. Negative for fever, chills and fatigue.  Skin: Positive for wound.  All other systems reviewed and are negative.   Allergies  Biaxin; Demerol; Hydrocodone; Sulfa antibiotics; Tramadol; and Ultracet  Home Medications   Prior to Admission medications   Medication Sig Start Date End Date Taking? Authorizing Provider  aspirin 81 MG chewable tablet Chew 81 mg by mouth daily.    Historical Provider, MD  Biotin 1 MG CAPS Take by mouth.    Historical Provider, MD  Dexamethasone (DECADRON PO) Take by mouth as directed. With pomalyst    Historical Provider, MD  doxycycline (VIBRAMYCIN) 100 MG capsule Take 1 capsule (100 mg total) by mouth 2 (two) times daily. 03/08/15   Lorin Picket, PA-C  fluticasone (FLONASE) 50 MCG/ACT nasal spray Place 1 spray into both nostrils daily. 10/16/14   Sherlene Shams, MD  ibuprofen (ADVIL,MOTRIN) 800 MG tablet Take 1 tablet (800 mg total) by mouth every 8 (eight) hours as needed for moderate pain. 01/03/15   Forest Gleason, MD  mupirocin ointment (BACTROBAN) 2 % Apply 1 application topically 3 (three) times daily. 03/08/15   Lorin Picket, PA-C  silver sulfADIAZINE (SILVADENE) 1 % cream  Apply to affected area daily 02/21/15 02/21/16  Seeplaputhur Robinette Haines, MD   Meds Ordered and Administered this Visit  Medications - No data to display  BP 127/67 mmHg  Pulse 90  Temp(Src) 98.9 F (37.2 C) (Tympanic)  Resp 20  Ht '5\' 2"'  (1.575 m)  Wt 103 lb (46.72 kg)  BMI 18.83 kg/m2  SpO2 99% No data found.   Physical Exam  Constitutional: She is oriented to person, place, and time. She appears well-developed and well-nourished.  HENT:  Head: Normocephalic and atraumatic.  Eyes: Pupils are equal, round, and reactive to light.  Cardiovascular:  Dorsalis and posterior tibialis pulses are palpable today in the office bilaterally  Musculoskeletal:  Examination  of the bilateral lower tibia and distal thirds shows 2 nonhealing wounds covered with Silvadene cream. There is a large amount of erythema surrounding the wounds. Her skin is very thin. She is able to ambulate.  Neurological: She is alert and oriented to person, place, and time.  Skin: Skin is warm and dry. There is erythema.  Psychiatric: She has a normal mood and affect. Her behavior is normal. Judgment and thought content normal.  Nursing note and vitals reviewed.   ED Course  Procedures (including critical care time)  Labs Review Labs Reviewed - No data to display  Imaging Review Dg Tibia/fibula Left  03/08/2015   CLINICAL DATA:  Bilateral lower leg pain after fall 7 weeks prior. Nonhealing wound of the anterior tibial bilaterally.  EXAM: LEFT TIBIA AND FIBULA - 2 VIEW  COMPARISON:  None.  FINDINGS: The patient is status post left total knee arthroplasty, with no evidence hardware fracture or loosening in the visualized portions. No fracture or periosteal reaction is seen in the left tibia or left fibula. There is a subcentimeter sclerotic lesion in the left medial malleolus with a narrow zone of transition, nonspecific, favor a benign bone island. No aggressive appearing focal osseous lesion. No malalignment is seen at the left knee or left ankle on the provided views. Vascular calcifications are noted in the posterior soft tissues at the level of the mid tibial shaft, which likely represent a combination of atherosclerotic arterial calcifications and calcified venous phleboliths.  IMPRESSION: 1. No fracture in the left tibia or left fibula. No radiographic evidence of osteomyelitis in the left tibia or left fibula. 2. Nonspecific tiny sclerotic lesion in the left medial malleolus, favor a benign lesion such as a bone island. No aggressive appearing focal osseous lesions.   Electronically Signed   By: Ilona Sorrel M.D.   On: 03/08/2015 17:47   Dg Tibia/fibula Right  03/08/2015   CLINICAL DATA:   Bilateral lower leg pain after falling 7 weeks ago.  EXAM: RIGHT TIBIA AND FIBULA - 2 VIEW  COMPARISON:  None.  FINDINGS: No fracture of the tibia or fibula. Knee joint and ankle joint appear normal on two views.  IMPRESSION: No fracture or dislocation.   Electronically Signed   By: Suzy Bouchard M.D.   On: 03/08/2015 17:45     Visual Acuity Review  Right Eye Distance:   Left Eye Distance:   Bilateral Distance:    Right Eye Near:   Left Eye Near:    Bilateral Near:         MDM   1. Nonhealing nonsurgical wound with fat layer exposed     New Prescriptions   DOXYCYCLINE (VIBRAMYCIN) 100 MG CAPSULE    Take 1 capsule (100 mg total) by mouth 2 (two) times daily.  MUPIROCIN OINTMENT (BACTROBAN) 2 %    Apply 1 application topically 3 (three) times daily.  Plan: 1. Test/x-ray results and diagnosis reviewed with patient 2. rx as per orders; risks, benefits, potential side effects reviewed with patient 3. Recommend supportive treatment with washing 3 times a day drying and applying Bactroban 4. F/u at [facility] wound care center. Referrals made through Epic today and I will see if I can get her in sooner since they are leaving for the beach on Saturday to prolong her care by one more week. She is intent on going to the beach since it may be this at last time she'll be able to spend time with both of her sons. The meantime I have asked her to stop the use of Silvadene we will switch her to Bactroban ointment I will also start her on some doxycycline to assist with healing of the wound.  Lorin Picket, PA-C 03/08/15 1817  Lorin Picket, PA-C 03/09/15 2028

## 2015-03-10 ENCOUNTER — Encounter: Payer: Medicare Other | Attending: Surgery | Admitting: Surgery

## 2015-03-10 DIAGNOSIS — X58XXXA Exposure to other specified factors, initial encounter: Secondary | ICD-10-CM | POA: Insufficient documentation

## 2015-03-10 DIAGNOSIS — C9000 Multiple myeloma not having achieved remission: Secondary | ICD-10-CM | POA: Insufficient documentation

## 2015-03-10 DIAGNOSIS — L97222 Non-pressure chronic ulcer of left calf with fat layer exposed: Secondary | ICD-10-CM | POA: Diagnosis not present

## 2015-03-10 DIAGNOSIS — L0889 Other specified local infections of the skin and subcutaneous tissue: Secondary | ICD-10-CM | POA: Diagnosis not present

## 2015-03-10 DIAGNOSIS — S81812A Laceration without foreign body, left lower leg, initial encounter: Secondary | ICD-10-CM | POA: Insufficient documentation

## 2015-03-10 DIAGNOSIS — M1990 Unspecified osteoarthritis, unspecified site: Secondary | ICD-10-CM | POA: Diagnosis not present

## 2015-03-10 DIAGNOSIS — I4891 Unspecified atrial fibrillation: Secondary | ICD-10-CM | POA: Diagnosis not present

## 2015-03-10 DIAGNOSIS — L97212 Non-pressure chronic ulcer of right calf with fat layer exposed: Secondary | ICD-10-CM | POA: Diagnosis not present

## 2015-03-10 DIAGNOSIS — S81811A Laceration without foreign body, right lower leg, initial encounter: Secondary | ICD-10-CM | POA: Diagnosis not present

## 2015-03-10 DIAGNOSIS — I2510 Atherosclerotic heart disease of native coronary artery without angina pectoris: Secondary | ICD-10-CM | POA: Insufficient documentation

## 2015-03-10 DIAGNOSIS — I10 Essential (primary) hypertension: Secondary | ICD-10-CM | POA: Diagnosis not present

## 2015-03-11 NOTE — Progress Notes (Signed)
[name] ([id number]) Visit Report for 03/10/2015 Allergy List Details Patient Name: [name]. Date of Service: 03/10/2015 1:30 PM Medical Record Number: [id number] Patient Account Number: [id number] Date of Birth/Sex: [birth year] (81 y.o. Female) Treating RN: Cornell Barman Primary Care Physician: Derinda Late Other Clinician: Referring Physician: Crecencio Mc Treating Physician/Extender: Frann Rider in Treatment: 0 Allergies Active Allergies Biaxin Demerol hydrocodone Sulfa (Sulfonamide Antibiotics) tramadol Ultracet Allergy Notes Electronic Signature(s) Signed: 03/10/2015 8:43:22 AM By: Gretta Cool, RN, BSN, Kim RN, BSN Entered By: Gretta Cool, RN, BSN, Kim on 03/10/2015 08:43:22 [name] ([id number]) -------------------------------------------------------------------------------- Arrival Information Details Patient Name: Roads, Penny Coder M. Date of Service: 03/10/2015 1:30 PM Medical Record Number: [id number] Patient Account Number: [id number] Date of Birth/Sex: 09/06/1924 (81 y.o. Female) Treating RN: Baruch Gouty, RN, BSN, Velva Harman Primary Care Physician: Derinda Late Other Clinician: Referring Physician: Crecencio Mc Treating Physician/Extender: Frann Rider in Treatment: 0 Visit Information Patient Arrived: [place] Ple[place] Time: 13:32 Accompanied By: friend Transfer Assistance: None Patient Identification Verified: Yes Secondary Verification Process Completed: Yes Patient Requires Transmission-Based No Precautions: Patient Has Alerts: No Electronic Signature(s) Signed: 03/10/2015 1:32:38 PM By: Regan Lemming BSN, RN Entered By: Regan Lemming on 03/10/2015 13:32:38 [name] ([id number]) -------------------------------------------------------------------------------- Clinic Level of Care Assessment Details Patient Name: Limones, Penny Coder M. Date of Service: 03/10/2015 1:30 PM Medical Record Number: [id number] Patient Account Number:  [id number] Date of Birth/Sex: November 11, 1924 (81 y.o. Female) Treating RN: Afful, RN, BSN, Velva Harman Primary Care Physician: Derinda Late Other Clinician: Referring Physician: Crecencio Mc Treating Physician/Extender: Frann Rider in Treatment: 0 Clinic Level of Care Assessment Items TOOL 1 Quantity Score []  - Use when EandM and Procedure is performed on INITIAL visit 0 ASSESSMENTS - Nursing Assessment / Reassessment X - General Physical Exam (combine w/ comprehensive assessment (listed just 1 20 below) when performed on new pt. evals) X - Comprehensive Assessment (HX, ROS, Risk Assessments, Wounds Hx, etc.) 1 25 ASSESSMENTS - Wound and Skin Assessment / Reassessment []  - Dermatologic / Skin Assessment (not related to wound area) 0 ASSESSMENTS - Ostomy and/or Continence Assessment and Care []  - Incontinence Assessment and Management 0 []  - Ostomy Care Assessment and Management (repouching, etc.) 0 PROCESS - Coordination of Care X - Simple Patient / Family Education for ongoing care 1 15 []  - Complex (extensive) Patient / Family Education for ongoing care 0 X - Staff obtains Consents, Records, Test Results / Process Orders 1 10 []  - Staff telephones HHA, Nursing Homes / Clarify orders / etc 0 []  - Routine Transfer to another Facility (non-emergent condition) 0 []  - Routine Hospital Admission (non-emergent condition) 0 X - New Admissions / Biomedical engineer / Ordering NPWT, Apligraf, etc. 1 15 []  - Emergency Hospital Admission (emergent condition) 0 PROCESS - Special Needs []  - Pediatric / Minor Patient Management 0 []  - Isolation Patient Management 0 [name], [name] ([id number]) []  - Hearing / Language / Visual special needs 0 []  - Assessment of Community assistance (transportation, D/C planning, etc.) 0 []  - Additional assistance / Altered mentation 0 []  - Support Surface(s) Assessment (bed, cushion, seat, etc.) 0 INTERVENTIONS - Miscellaneous []  - External ear exam  0 []  - Patient Transfer (multiple staff / Civil Service fast streamer / Similar devices) 0 []  - Simple Staple / Suture removal (25 or less) 0 []  - Complex Staple / Suture removal (26 or more) 0 []  - Hypo/Hyperglycemic Management (do not check if billed separately) 0 []  - Ankle / Brachial Index (ABI) - do not check if billed separately  0 Has the patient been seen at the hospital within the last three years: Yes Total Score: 85 Level Of Care: New/Established - Level 3 Electronic Signature(s) Signed: 03/10/2015 2:32:37 PM By: Regan Lemming BSN, RN Entered By: Regan Lemming on 03/10/2015 14:32:37 [name] ([id number]) -------------------------------------------------------------------------------- Encounter Discharge Information Details Patient Name: [name], [name] Date of Service: 03/10/2015 1:30 PM Medical Record Number: [id number] Patient Account Number: [id number] Date of Birth/Sex: Dec 18, 1924 (81 y.o. Female) Treating RN: Baruch Gouty, RN, BSN, Velva Harman Primary Care Physician: Derinda Late Other Clinician: Referring Physician: Crecencio Mc Treating Physician/Extender: Frann Rider in Treatment: 0 Encounter Discharge Information Items Discharge Pain Level: 0 Discharge Condition: Stable Ambulatory Status: Cane Discharge Destination: Home Transportation: Private Auto Accompanied By: friend Schedule Follow-up Appointment: No Medication Reconciliation completed No and provided to Patient/Care Provider: Provided on Clinical Summary of Care: 03/10/2015 Form Type Recipient Paper Patient MM Electronic Signature(s) Signed: 03/10/2015 2:25:48 PM By: Ruthine Dose Previous Signature: 03/10/2015 2:14:07 PM Version By: Regan Lemming BSN, RN Entered By: Ruthine Dose on 03/10/2015 14:25:48 [name] ([id number]) -------------------------------------------------------------------------------- Lower Extremity Assessment Details Patient Name: [name], [name] Date of Service: 03/10/2015 1:30  PM Medical Record Number: [id number] Patient Account Number: [id number] Date of Birth/Sex: Aug 11, 1924 (81 y.o. Female) Treating RN: Afful, RN, BSN, [place] Primary Care Physician: Derinda Late Other Clinician: Referring Physician: Crecencio Mc Treating Physician/Extender: Frann Rider in Treatment: 0 Edema Assessment Assessed: [Left: No] [Right: No] Edema: [Left: No] [Right: No] Calf Left: Right: Point of Measurement: 32 cm From Medial Instep 32 cm 32 cm Ankle Left: Right: Point of Measurement: 8 cm From Medial Instep 19 cm 19 cm Vascular Assessment Pulses: Posterior Tibial Palpable: [Left:Yes] [Right:Yes] Doppler: [Left:Multiphasic] [Right:Multiphasic] Dorsalis Pedis Palpable: [Left:Yes] [Right:Yes] Doppler: [Left:Multiphasic] [Right:Multiphasic] Extremity colors, hair growth, and conditions: Extremity Color: [Left:Normal] [Right:Normal] Hair Growth on Extremity: [Left:No] [Right:No] Temperature of Extremity: [Left:Warm] [Right:Warm] Capillary Refill: [Left:< 3 seconds] [Right:< 3 seconds] Toe Nail Assessment Left: Right: Thick: No No Discolored: No No Deformed: No No Improper Length and Hygiene: No No Notes Patient complaining of severe pain, ABI not completed. Will obtain next visit. Balis, [name] ([id number]) Electronic Signature(s) Signed: 03/10/2015 1:46:36 PM By: Regan Lemming BSN, RN Entered By: Regan Lemming on 03/10/2015 13:46:36 Kuiken, [name] ([id number]) -------------------------------------------------------------------------------- Multi Wound Chart Details Patient Name: Yambao, Penny Coder M. Date of Service: 03/10/2015 1:30 PM Medical Record Number: [id number] Patient Account Number: [id number] Date of Birth/Sex: 29-Sep-1924 (81 y.o. Female) Treating RN: Baruch Gouty, RN, BSN, Velva Harman Primary Care Physician: Derinda Late Other Clinician: Referring Physician: Crecencio Mc Treating Physician/Extender: Frann Rider in Treatment: 0 Vital  Signs Height(in): 62 Pulse(bpm): 91 Weight(lbs): 103 Blood Pressure 97/64 (mmHg): Body Mass Index(BMI): 19 Temperature(F): 98.1 Respiratory Rate 17 (breaths/min): Photos: [1:No Photos] [2:No Photos] [N/A:N/A] Wound Location: [1:Left Lower Leg - Anterior] [2:Right Lower Leg - Anterior N/A] Wounding Event: [1:Trauma] [2:Trauma] [N/A:N/A] Primary Etiology: [1:Trauma, Other] [2:Trauma, Other] [N/A:N/A] Comorbid History: [1:Anemia, Arrhythmia, Coronary Artery Disease, Hypertension, Osteoarthritis, Received Chemotherapy] [2:Anemia, Arrhythmia, Coronary Artery Disease, Hypertension, Osteoarthritis, Received Chemotherapy] [N/A:N/A] Date Acquired: [1:02/07/2015] [2:02/07/2015] [N/A:N/A] Weeks of Treatment: [1:0] [2:0] [N/A:N/A] Wound Status: [1:Open] [2:Open] [N/A:N/A] Measurements L x W x D 1.2x1.2x0.1 [2:1.3x1x0.1] [N/A:N/A] (cm) Area (cm) : [1:1.131] [2:1.021] [N/A:N/A] Volume (cm) : [1:0.113] [2:0.102] [N/A:N/A] % Reduction in Area: [1:0.00%] [2:0.00%] [N/A:N/A] % Reduction in Volume: 0.00% [2:0.00%] [N/A:N/A] Classification: [1:Full Thickness Without Exposed Support Structures] [2:Full Thickness Without Exposed Support Structures] [N/A:N/A] Exudate Amount: [1:Medium] [2:Medium] [N/A:N/A] Exudate Type: [1:Serosanguineous] [2:Serosanguineous] [N/A:N/A] Exudate Color: [1:red, brown] [  2:red, brown] [N/A:N/A] Wound Margin: [1:Distinct, outline attached] [2:Distinct, outline attached] [N/A:N/A] Granulation Amount: [1:Medium (34-66%)] [2:Medium (34-66%)] [N/A:N/A] Granulation Quality: [1:Pink, Pale] [2:Pink, Pale] [N/A:N/A] Necrotic Amount: [1:Medium (34-66%)] [2:Medium (34-66%)] [N/A:N/A] Exposed Structures: [1:Fascia: No Fat: No] [2:Fascia: No Fat: No] [N/A:N/A] Tendon: No Tendon: No Muscle: No Muscle: No Joint: No Joint: No Bone: No Bone: No Limited to Skin Limited to Skin Breakdown Breakdown Epithelialization: None None N/A Periwound Skin Texture: Edema: No Edema: No  N/A Excoriation: No Excoriation: No Induration: No Induration: No Callus: No Callus: No Crepitus: No Crepitus: No Fluctuance: No Fluctuance: No Friable: No Friable: No Rash: No Rash: No Scarring: No Scarring: No Periwound Skin Moist: Yes Moist: Yes N/A Moisture: Maceration: No Maceration: No Dry/Scaly: No Dry/Scaly: No Periwound Skin Color: Atrophie Blanche: No Atrophie Blanche: No N/A Cyanosis: No Cyanosis: No Ecchymosis: No Ecchymosis: No Erythema: No Erythema: No Hemosiderin Staining: No Hemosiderin Staining: No Mottled: No Mottled: No Pallor: No Pallor: No Rubor: No Rubor: No Temperature: No Abnormality No Abnormality N/A Tenderness on Yes No N/A Palpation: Wound Preparation: Ulcer Cleansing: Ulcer Cleansing: N/A Rinsed/Irrigated with Rinsed/Irrigated with Saline Saline Topical Anesthetic Topical Anesthetic Applied: Other: lidocaine Applied: Other: lidocaine 4% 4% Treatment Notes Electronic Signature(s) Signed: 03/10/2015 1:59:33 PM By: Regan Lemming BSN, RN Entered By: Regan Lemming on 03/10/2015 13:59:33 [place], [name] ([id number]) -------------------------------------------------------------------------------- [name] Details Patient Name: Messer, Penny Coder M. Date of Service: 03/10/2015 1:30 PM Medical Record Number: [id number] Patient Account Number: [id number] Date of Birth/Sex: Jun 18, 1924 (81 y.o. Female) Treating RN: Afful, RN, BSN, Velva Harman Primary Care Physician: Derinda Late Other Clinician: Referring Physician: Crecencio Mc Treating Physician/Extender: Frann Rider in Treatment: 0 Active Inactive Abuse / Safety / Falls / Self Care Management Nursing Diagnoses: Impaired physical mobility Potential for falls Self care deficit: actual or potential Goals: Patient/caregiver will verbalize understanding of skin care regimen Date Initiated: 03/10/2015 Goal Status: Active Patient/caregiver will  verbalize/demonstrate measure taken to improve self care Date Initiated: 03/10/2015 Goal Status: Active Patient/caregiver will verbalize/demonstrate measures taken to improve the patient's personal safety Date Initiated: 03/10/2015 Goal Status: Active Patient/caregiver will verbalize/demonstrate measures taken to prevent injury and/or falls Date Initiated: 03/10/2015 Goal Status: Active Patient/caregiver will verbalize/demonstrate understanding of what to do in case of emergency Date Initiated: 03/10/2015 Goal Status: Active Interventions: Assess fall risk on admission and as needed Assess self care needs on admission and as needed Provide education on basic hygiene Provide education on fall prevention Provide education on personal and home safety Provide education on safe transfers Notes: Orientation to the [place], [name]. ([id number]) Nursing Diagnoses: Knowledge deficit related to the wound healing center program Goals: Patient/caregiver will verbalize understanding of the [place] Program Date Initiated: 03/10/2015 Goal Status: Active Interventions: Provide education on orientation to the wound center Notes: Wound/Skin Impairment Nursing Diagnoses: Impaired tissue integrity Knowledge deficit related to ulceration/compromised skin integrity Goals: Patient/caregiver will verbalize understanding of skin care regimen Date Initiated: 03/10/2015 Goal Status: Active Ulcer/skin breakdown will have a volume reduction of 30% by week 4 Date Initiated: 03/10/2015 Goal Status: Active Ulcer/skin breakdown will have a volume reduction of 50% by week 8 Date Initiated: 03/10/2015 Goal Status: Active Ulcer/skin breakdown will have a volume reduction of 80% by week 12 Date Initiated: 03/10/2015 Goal Status: Active Ulcer/skin breakdown will heal within 14 weeks Date Initiated: 03/10/2015 Goal Status: Active Interventions: Assess patient/caregiver ability to  obtain necessary supplies Assess ulceration(s) every visit Provide education on ulcer and skin care Treatment Activities:  Referred to DME provider for dressing supplies : 03/10/2015 Skin care regimen initiated : 03/10/2015 Topical wound management initiated : 03/10/2015 [place], [name] ([id number]) Notes: Electronic Signature(s) Signed: 03/10/2015 1:59:16 PM By: Regan Lemming BSN, RN Entered By: Regan Lemming on 03/10/2015 13:59:16 Rolon, [name] ([id number]) -------------------------------------------------------------------------------- Pain Assessment Details Patient Name: [name], [name] M. Date of Service: 03/10/2015 1:30 PM Medical Record Number: [id number] Patient Account Number: [id number] Date of Birth/Sex: 22-Dec-1924 (81 y.o. Female) Treating RN: Baruch Gouty, RN, BSN, Velva Harman Primary Care Physician: Derinda Late Other Clinician: Referring Physician: Crecencio Mc Treating Physician/Extender: Frann Rider in Treatment: 0 Active Problems Location of Pain Severity and Description of Pain Patient Has Paino Yes Site Locations Pain Location: Pain in Ulcers Rate the pain. Current Pain Level: 6 Worst Pain Level: 8 Character of Pain Describe the Pain: Aching, Tender Pain Management and Medication Current Pain Management: Medication: Yes Cold Application: No Rest: Yes Massage: No Activity: No T.E.N.S.: No Heat Application: No Leg drop or elevation: No Is the Current Pain Management Inadequate Adequate: How does your pain impact your activities of daily livingo Sleep: Yes Bathing: Yes Appetite: Yes Relationship With Others: Yes Bladder Continence: Yes Emotions: Yes Bowel Continence: Yes Work: Yes Toileting: Yes Drive: Yes Dressing: Yes Hobbies: Yes Electronic Signature(s) Signed: 03/10/2015 1:36:17 PM By: Regan Lemming BSN, RN Hoel, [name] ([id number]) Entered By: Regan Lemming on 03/10/2015 13:36:17 Rager, [name]  ([id number]) -------------------------------------------------------------------------------- Patient/Caregiver Education Details Patient Name: Cannedy, Penny Coder M. Date of Service: 03/10/2015 1:30 PM Medical Record Number: [id number] Patient Account Number: [id number] Date of Birth/Gender: 12/13/1924 (81 y.o. Female) Treating RN: Baruch Gouty, RN, BSN, Velva Harman Primary Care Physician: Derinda Late Other Clinician: Referring Physician: Crecencio Mc Treating Physician/Extender: Frann Rider in Treatment: 0 Education Assessment Education Provided To: Patient Education Topics Provided Basic Hygiene: Methods: Explain/Verbal Responses: State content correctly Safety: Methods: Explain/Verbal Responses: State content correctly Welcome To [facility] [place]: Methods: Explain/Verbal Wound/Skin Impairment: Methods: Explain/Verbal Responses: State content correctly Electronic Signature(s) Signed: 03/10/2015 2:14:30 PM By: Regan Lemming BSN, RN Entered By: Regan Lemming on 03/10/2015 14:14:30 [place], [name] ([id number]) -------------------------------------------------------------------------------- Wound Assessment Details Patient Name: Malcolm, [name] Date of Service: 03/10/2015 1:30 PM Medical Record Number: [id number] Patient Account Number: [id number] Date of Birth/Sex: 31-Mar-1925 (81 y.o. Female) Treating RN: Afful, RN, BSN, [place] Primary Care Physician: Derinda Late Other Clinician: Referring Physician: Crecencio Mc Treating Physician/Extender: Frann Rider in Treatment: 0 Wound Status Wound Number: 1 Primary Trauma, Other Etiology: Wound Location: Left Lower Leg - Anterior Wound Open Wounding Event: Trauma Status: Date Acquired: 02/07/2015 Comorbid Anemia, Arrhythmia, Coronary Artery Weeks Of Treatment: 0 History: Disease, Hypertension, Osteoarthritis, Clustered Wound: No Received Chemotherapy Photos Photo Uploaded By: Regan Lemming on 03/10/2015  14:33:40 Wound Measurements Length: (cm) 1.2 Width: (cm) 1.2 Depth: (cm) 0.1 Area: (cm) 1.131 Volume: (cm) 0.113 % Reduction in Area: 0% % Reduction in Volume: 0% Epithelialization: None Tunneling: No Wound Description Full Thickness Without Exposed Classification: Support Structures Wound Margin: Distinct, outline attached Exudate Medium Amount: Exudate Type: Serosanguineous Exudate Color: red, brown Foul Odor After Cleansing: No Wound Bed Granulation Amount: Medium (34-66%) Exposed Structure Granulation Quality: Pink, Pale Fascia Exposed: No Rew, Mao M. ([id number]) Necrotic Amount: Medium (34-66%) Fat Layer Exposed: No Necrotic Quality: Adherent Slough Tendon Exposed: No Muscle Exposed: No Joint Exposed: No Bone Exposed: No Limited to Skin Breakdown Periwound Skin Texture Texture Color No Abnormalities Noted: No No Abnormalities Noted: No Callus: No Atrophie Blanche: No Crepitus: No Cyanosis: No Excoriation: No Ecchymosis:  No Fluctuance: No Erythema: No Friable: No Hemosiderin Staining: No Induration: No Mottled: No Localized Edema: No Pallor: No Rash: No Rubor: No Scarring: No Temperature / Pain Moisture Temperature: No Abnormality No Abnormalities Noted: No Tenderness on Palpation: Yes Dry / Scaly: No Maceration: No Moist: Yes Wound Preparation Ulcer Cleansing: Rinsed/Irrigated with Saline Topical Anesthetic Applied: Other: lidocaine 4%, Treatment Notes Wound #1 (Left, Anterior Lower Leg) 1. Cleansed with: Clean wound with Normal Saline 4. Dressing Applied: Iodosorb Ointment 5. Secondary Dressing Applied Gauze and Kerlix/Conform 7. Secured with Recruitment consultant) Signed: 03/10/2015 1:48:46 PM By: Regan Lemming BSN, RN Entered By: Regan Lemming on 03/10/2015 13:48:46 [place], [name] ([id number]) -------------------------------------------------------------------------------- Wound Assessment Details Patient Name:  Kussman, [name] Date of Service: 03/10/2015 1:30 PM Medical Record Number: [id number] Patient Account Number: [id number] Date of Birth/Sex: 06-04-1925 (81 y.o. Female) Treating RN: Afful, RN, BSN, [place] Primary Care Physician: Derinda Late Other Clinician: Referring Physician: Crecencio Mc Treating Physician/Extender: Frann Rider in Treatment: 0 Wound Status Wound Number: 2 Primary Trauma, Other Etiology: Wound Location: Right Lower Leg - Anterior Wound Open Wounding Event: Trauma Status: Date Acquired: 02/07/2015 Comorbid Anemia, Arrhythmia, Coronary Artery Weeks Of Treatment: 0 History: Disease, Hypertension, Osteoarthritis, Clustered Wound: No Received Chemotherapy Photos Photo Uploaded By: Regan Lemming on 03/10/2015 14:34:06 Wound Measurements Length: (cm) 1.3 Width: (cm) 1 Depth: (cm) 0.1 Area: (cm) 1.021 Volume: (cm) 0.102 % Reduction in Area: 0% % Reduction in Volume: 0% Epithelialization: None Tunneling: No Undermining: No Wound Description Full Thickness Without Exposed Classification: Support Structures Wound Margin: Distinct, outline attached Exudate Medium Amount: Exudate Type: Serosanguineous Exudate Color: red, brown Foul Odor After Cleansing: No Wound Bed Granulation Amount: Medium (34-66%) Exposed Structure Granulation Quality: Pink, Pale Fascia Exposed: No Goetsch, Ivanna M. ([id number]) Necrotic Amount: Medium (34-66%) Fat Layer Exposed: No Necrotic Quality: Adherent Slough Tendon Exposed: No Muscle Exposed: No Joint Exposed: No Bone Exposed: No Limited to Skin Breakdown Periwound Skin Texture Texture Color No Abnormalities Noted: No No Abnormalities Noted: No Callus: No Atrophie Blanche: No Crepitus: No Cyanosis: No Excoriation: No Ecchymosis: No Fluctuance: No Erythema: No Friable: No Hemosiderin Staining: No Induration: No Mottled: No Localized Edema: No Pallor: No Rash: No Rubor: No Scarring: No  Temperature / Pain Moisture Temperature: No Abnormality No Abnormalities Noted: No Tenderness on Palpation: Yes Dry / Scaly: No Maceration: No Moist: Yes Wound Preparation Ulcer Cleansing: Rinsed/Irrigated with Saline Topical Anesthetic Applied: Other: lidocaine 4%, Treatment Notes Wound #2 (Right, Anterior Lower Leg) 1. Cleansed with: Clean wound with Normal Saline 4. Dressing Applied: Iodosorb Ointment 5. Secondary Dressing Applied Gauze and Kerlix/Conform 7. Secured with Recruitment consultant) Signed: 03/10/2015 2:00:04 PM By: Regan Lemming BSN, RN Previous Signature: 03/10/2015 1:50:02 PM Version By: Regan Lemming BSN, RN Entered By: Regan Lemming on 03/10/2015 14:00:04 [place], [name] ([id number]) -------------------------------------------------------------------------------- Vitals Details Patient Name: Spinney, Ocean M. Date of Service: 03/10/2015 1:30 PM Medical Record Number: [id number] Patient Account Number: [id number] Date of Birth/Sex: February 22, 1925 (81 y.o. Female) Treating RN: Afful, RN, BSN, [place] Primary Care Physician: Derinda Late Other Clinician: Referring Physician: Crecencio Mc Treating Physician/Extender: Frann Rider in Treatment: 0 Vital Signs Time Taken: 13:41 Temperature (F): 98.1 Height (in): 62 Pulse (bpm): 91 Source: Stated Respiratory Rate (breaths/min): 17 Weight (lbs): 103 Blood Pressure (mmHg): 97/64 Source: Stated Reference Range: 80 - 120 mg / dl Body Mass Index (BMI): 18.8 Electronic Signature(s) Signed: 03/10/2015 1:42:03 PM By: Regan Lemming BSN, RN Entered By: Regan Lemming on 03/10/2015 13:42:03

## 2015-03-11 NOTE — Progress Notes (Signed)
Belling, YANELLE SOUSA ([id number]) Visit Report for 03/10/2015 Abuse/Suicide Risk Screen Details Patient Name: Knappenberger, ARIZONA SORN. Date of Service: 03/10/2015 1:30 PM Medical Record Patient Account Number: [id number] Number: Afful, RN, BSN, Treating RN: 1925-04-09 (81 y.o. Velva Harman Date of Birth/Sex: Female) Other Clinician: Primary Care Physician: BABAOFF, MARCUS Treating Britto, Errol Referring Physician: Crecencio Mc Physician/Extender: Weeks in Treatment: 0 Abuse/Suicide Risk Screen Items Answer ABUSE/SUICIDE RISK SCREEN: Has anyone close to you tried to hurt or harm you recentlyo No Do you feel uncomfortable with anyone in your familyo No Has anyone forced you do things that you didnot want to doo No Do you have any thoughts of harming yourselfo No Patient displays signs or symptoms of abuse and/or neglect. No Electronic Signature(s) Signed: 03/10/2015 1:42:11 PM By: Regan Lemming BSN, RN Entered By: Regan Lemming on 03/10/2015 13:42:11 Barresi, [name] ([id number]) -------------------------------------------------------------------------------- Activities of Daily Living Details Patient Name: Uemura, Brynnlee M. Date of Service: 03/10/2015 1:30 PM Medical Record Patient Account Number: [id number] Number: Afful, RN, BSN, Treating RN: January 04, 1925 (81 y.o. Velva Harman Date of Birth/Sex: Female) Other Clinician: Primary Care Physician: BABAOFF, MARCUS Treating Christin Fudge Referring Physician: Crecencio Mc Physician/Extender: Weeks in Treatment: 0 Activities of Daily Living Items Answer Activities of Daily Living (Please select one for each item) Drive Automobile Need Assistance Take Medications Completely Able Use Telephone Completely Able Care for Appearance Completely Able Use Toilet Completely Able Bath / Shower Completely Able Dress Self Completely Able Feed Self Completely Able Walk Completely Able Get In / Out Bed Completely Able Housework Completely  Able Prepare Meals Completely Able Handle Money Completely Able Shop for Self Completely Able Electronic Signature(s) Signed: 03/10/2015 1:42:52 PM By: Regan Lemming BSN, RN Entered By: Regan Lemming on 03/10/2015 13:42:51 Tusing, [name] ([id number]) -------------------------------------------------------------------------------- Education Assessment Details Patient Name: Millican, Debe Coder M. Date of Service: 03/10/2015 1:30 PM Medical Record Patient Account Number: [id number] Number: Afful, RN, BSN, Treating RN: 11/02/1924 (81 y.o. Velva Harman Date of Birth/Sex: Female) Other Clinician: Primary Care Physician: BABAOFF, MARCUS Treating Christin Fudge Referring Physician: Crecencio Mc Physician/Extender: Weeks in Treatment: 0 Primary Learner Assessed: Patient Learning Preferences/Education Level/Primary Language Learning Preference: Explanation Highest Education Level: High School Preferred Language: English Cognitive Barrier Assessment/Beliefs Language Barrier: No Physical Barrier Assessment Impaired Vision: Yes Glasses Impaired Hearing: No Decreased Hand dexterity: No Knowledge/Comprehension Assessment Knowledge Level: High Comprehension Level: High Ability to understand written High instructions: Ability to understand verbal High instructions: Motivation Assessment Anxiety Level: Anxious Cooperation: Cooperative Education Importance: Acknowledges Need Interest in Health Problems: Asks Questions Willingness to Engage in Self- High Management Activities: Readiness to Engage in Self- High Management Activities: Electronic Signature(s) Signed: 03/10/2015 1:43:15 PM By: Regan Lemming BSN, RN Entered By: Regan Lemming on 03/10/2015 13:43:15 [place], [name] ([id number]) -------------------------------------------------------------------------------- Fall Risk Assessment Details Patient Name: Fuller, Debe Coder M. Date of Service: 03/10/2015 1:30 PM Medical Record Patient  Account Number: [id number] Number: Afful, RN, BSN, Treating RN: 09/17/1924 (81 y.o. Velva Harman Date of Birth/Sex: Female) Other Clinician: Primary Care Physician: BABAOFF, MARCUS Treating Britto, Errol Referring Physician: Crecencio Mc Physician/Extender: Weeks in Treatment: 0 Fall Risk Assessment Items FALL RISK ASSESSMENT: History of falling - immediate or within 3 months 25 Yes Secondary diagnosis 0 No Ambulatory aid None/bed rest/wheelchair/nurse 0 No Crutches/cane/walker 15 Yes Furniture 0 No IV Access/Saline Lock 0 No Gait/Training Normal/bed rest/immobile 0 Yes Weak 0 No Impaired 0 No Mental Status Oriented to own ability 0 Yes Electronic Signature(s) Signed: 03/10/2015 1:43:29 PM By: Regan Lemming BSN, RN Entered  ByRegan Lemming on 03/10/2015 13:43:29 Cayson, [name] ([id number]) -------------------------------------------------------------------------------- Foot Assessment Details Patient Name: Leuthold, Jaelyne M. Date of Service: 03/10/2015 1:30 PM Medical Record Patient Account Number: [id number] Number: Afful, RN, BSN, Treating RN: 01-Oct-1924 (81 y.o. Velva Harman Date of Birth/Sex: Female) Other Clinician: Primary Care Physician: BABAOFF, MARCUS Treating Britto, Errol Referring Physician: Crecencio Mc Physician/Extender: Weeks in Treatment: 0 Foot Assessment Items Site Locations + = Sensation present, - = Sensation absent, C = Callus, U = Ulcer R = Redness, W = Warmth, M = Maceration, PU = Pre-ulcerative lesion F = Fissure, S = Swelling, D = Dryness Assessment Right: Left: Other Deformity: No No Prior Foot Ulcer: No No Prior Amputation: No No Charcot Joint: No No Ambulatory Status: Ambulatory With Help Assistance Device: Cane Gait: Administrator, arts) Signed: 03/10/2015 1:43:45 PM By: Regan Lemming BSN, RN Entered By: Regan Lemming on 03/10/2015 13:43:45 Blick, Clydia M. ([id number]) Castronova, Miranda M.  ([id number]) -------------------------------------------------------------------------------- Nutrition Risk Assessment Details Patient Name: Swarey, Maybree M. Date of Service: 03/10/2015 1:30 PM Medical Record Patient Account Number: [id number] Number: Afful, RN, BSN, Treating RN: 29-Nov-1924 (81 y.o. Velva Harman Date of Birth/Sex: Female) Other Clinician: Primary Care Physician: BABAOFF, MARCUS Treating Britto, Errol Referring Physician: Crecencio Mc Physician/Extender: Weeks in Treatment: 0 Height (in): 62 Weight (lbs): 103 Body Mass Index (BMI): 18.8 Nutrition Risk Assessment Items NUTRITION RISK SCREEN: I have an illness or condition that made me change the kind and/or 0 No amount of food I eat I eat fewer than two meals per day 0 No I eat few fruits and vegetables, or milk products 0 No I have three or more drinks of beer, liquor or wine almost every day 0 No I have tooth or mouth problems that make it hard for me to eat 0 No I don't always have enough money to buy the food I need 0 No I eat alone most of the time 0 No I take three or more different prescribed or over-the-counter drugs a 0 No day Without wanting to, I have lost or gained 10 pounds in the last six 0 No months I am not always physically able to shop, cook and/or feed myself 0 No Nutrition Protocols Good Risk Protocol 0 No interventions needed Moderate Risk Protocol Electronic Signature(s) Signed: 03/10/2015 1:43:35 PM By: Regan Lemming BSN, RN Entered By: Regan Lemming on 03/10/2015 13:43:35

## 2015-03-15 NOTE — Progress Notes (Signed)
[name], [name] ([id number]) Visit Report for 03/10/2015 Chief Complaint Document Details Patient Name: [name], Penny [facility] M. Date of Service: 03/10/2015 1:30 PM Medical Record Patient Account Number: [id number] Number: Afful, RN, BSN, Treating RN: July 23, 1924 (81 y.o. [name] Date of Birth/Sex: Female) Other Clinician: Primary Care Physician: BABAOFF, MARCUS Treating Christin Fudge Referring Physician: Crecencio Mc Physician/Extender: Weeks in Treatment: 0 Information Obtained from: Patient Chief Complaint Patient presents to the wound care center for a consult due non healing wound She had a fall and injured both lower extremities 8 weeks ago Electronic Signature(s) Signed: 03/10/2015 3:04:44 PM By: Christin Fudge MD, FACS Entered By: Christin Fudge on 03/10/2015 15:04:44 [name] ([id number]) -------------------------------------------------------------------------------- Debridement Details Patient Name: [name], [name] Date of Service: 03/10/2015 1:30 PM Medical Record Patient Account Number: [id number] Number: Afful, RN, BSN, Treating RN: 15-Aug-1924 (81 y.o. [name] Date of Birth/Sex: Female) Other Clinician: Primary Care Physician: BABAOFF, MARCUS Treating [name] Referring Physician: Crecencio Mc Physician/Extender: Weeks in Treatment: 0 Debridement Performed for Wound #1 Left,Anterior Lower Leg Assessment: Performed By: Physician Pat Patrick., MD Debridement: Debridement Pre-procedure Yes Verification/Time Out Taken: Start Time: 14:03 Pain Control: Lidocaine 4% Topical Solution Level: Skin/Subcutaneous Tissue Total Area Debrided (L x 1.2 (cm) x 1.2 (cm) = 1.44 (cm) W): Tissue and other Non-Viable, Exudate, Fibrin/Slough, Subcutaneous material debrided: Instrument: Curette Bleeding: Minimum Hemostasis Achieved: Pressure End Time: 14:07 Procedural Pain: 4 Post Procedural Pain: 4 Response to Treatment: Procedure was  tolerated well Post Debridement Measurements of Total Wound Length: (cm) 1.2 Width: (cm) 1.2 Depth: (cm) 0.1 Volume: (cm) 0.113 Post Procedure Diagnosis Same as Pre-procedure Electronic Signature(s) Signed: 03/10/2015 3:04:09 PM By: Christin Fudge MD, FACS Signed: 03/14/2015 5:04:50 PM By: Regan Lemming BSN, RN Previous Signature: 03/10/2015 2:10:51 PM Version By: Regan Lemming BSN, RN Entered By: Christin Fudge on 03/10/2015 15:04:09 [name], [name] ([id number]) [name], [name] ([id number]) -------------------------------------------------------------------------------- Debridement Details Patient Name: [name], [name] Date of Service: 03/10/2015 1:30 PM Medical Record Patient Account Number: [id number] Number: Afful, RN, BSN, Treating RN: 1924/11/13 (81 y.o. [name] Date of Birth/Sex: Female) Other Clinician: Primary Care Physician: BABAOFF, MARCUS Treating [name] Referring Physician: Crecencio Mc Physician/Extender: Weeks in Treatment: 0 Debridement Performed for Wound #2 Right,Anterior Lower Leg Assessment: Performed By: Physician Pat Patrick., MD Debridement: Debridement Pre-procedure Yes Verification/Time Out Taken: Start Time: 14:00 Pain Control: Lidocaine 4% Topical Solution Level: Skin/Subcutaneous Tissue Total Area Debrided (L x 1.3 (cm) x 1 (cm) = 1.3 (cm) W): Tissue and other Non-Viable, Exudate, Fibrin/Slough, Subcutaneous material debrided: Instrument: Curette Bleeding: Minimum Hemostasis Achieved: Pressure End Time: 14:03 Procedural Pain: 4 Post Procedural Pain: 4 Response to Treatment: Procedure was tolerated well Post Debridement Measurements of Total Wound Length: (cm) 1.3 Width: (cm) 1 Depth: (cm) 0.1 Volume: (cm) 0.102 Post Procedure Diagnosis Same as Pre-procedure Electronic Signature(s) Signed: 03/10/2015 3:04:17 PM By: Christin Fudge MD, FACS Signed: 03/14/2015 5:04:50 PM By: Regan Lemming BSN, RN Previous Signature:  03/10/2015 2:10:01 PM Version By: Regan Lemming BSN, RN Entered By: Christin Fudge on 03/10/2015 15:04:17 [name], [name] ([id number]) [name], [name] ([id number]) -------------------------------------------------------------------------------- HPI Details Patient Name: [name], [name] Date of Service: 03/10/2015 1:30 PM Medical Record Patient Account Number: [id number] Number: Afful, RN, BSN, Treating RN: 07/02/1924 (81 y.o. [name] Date of Birth/Sex: Female) Other Clinician: Primary Care Physician: BABAOFF, MARCUS Treating Christin Fudge Referring Physician: Crecencio Mc Physician/Extender: Weeks in Treatment: 0 History of Present Illness Location: both lower legs Quality: Patient reports experiencing a sharp pain  to affected area(s). Severity: Patient states wound are getting worse. Duration: Patient has had the wound for < 8 weeks prior to presenting for treatment Timing: Pain in wound is constant (hurts all the time) Context: The wound occurred when the patient tripped and fell and had lacerations on both lower extremities Modifying Factors: Other treatment(s) tried include:seen in the ER recently and was put on doxycycline Associated Signs and Symptoms: Patient reports having difficulty standing for long periods. HPI Description: pleasant 81 year old patient who had a lacerated wound to both lower extremities about 8 weeks ago and has been treating it with local antibody, ointment. She was seen by her oncologist recently who referred her to a surgeon for an unassociated problem and also asked her to get some x-rays which did not show any bone involvement. The patient has recently been put on doxycycline and some pain medications. She has multiple myeloma and is been on treatment for this for several years. Electronic Signature(s) Signed: 03/10/2015 3:06:44 PM By: Christin Fudge MD, FACS Entered By: Christin Fudge on 03/10/2015 15:06:43 [name], [name]  ([id number]) -------------------------------------------------------------------------------- Physical Exam Details Patient Name: [name], [name] Date of Service: 03/10/2015 1:30 PM Medical Record Patient Account Number: [id number] Number: Afful, RN, BSN, Treating RN: 11/07/1924 (81 y.o. [name] Date of Birth/Sex: Female) Other Clinician: Primary Care Physician: BABAOFF, MARCUS Treating Christin Fudge Referring Physician: Crecencio Mc Physician/Extender: Weeks in Treatment: 0 Constitutional . Pulse regular. Respirations normal and unlabored. Afebrile. . Eyes Nonicteric. Reactive to light. Ears, Nose, Mouth, and Throat Lips, teeth, and gums WNL.Marland Kitchen Moist mucosa without lesions . Neck supple and nontender. No palpable supraclavicular or cervical adenopathy. Normal sized without goiter. Respiratory WNL. No retractions.. Cardiovascular Pedal Pulses WNL. ABI could not be measured because of the tenderness both lower legs. No clubbing, cyanosis or edema. Gastrointestinal (GI) Abdomen without masses or tenderness.. No liver or spleen enlargement or tenderness.. Lymphatic No adneopathy. No adenopathy. No adenopathy. Musculoskeletal Adexa without tenderness or enlargement.. Digits and nails w/o clubbing, cyanosis, infection, petechiae, ischemia, or inflammatory conditions.. Integumentary (Hair, Skin) No suspicious lesions. No crepitus or fluctuance. No peri-wound warmth or erythema. No masses.Marland Kitchen Psychiatric Judgement and insight Intact.. No evidence of depression, anxiety, or agitation.. Notes She has significant abrasions both lower extremities down to her subcutaneous tissue and they're covered with slough and eschar. Electronic Signature(s) Signed: 03/10/2015 3:08:02 PM By: Christin Fudge MD, FACS Entered By: Christin Fudge on 03/10/2015 15:08:02 Sturges, [name] ([id number]) -------------------------------------------------------------------------------- Physician Orders  Details Patient Name: [name], Debe Coder M. Date of Service: 03/10/2015 1:30 PM Medical Record Patient Account Number: [id number] Number: Afful, RN, BSN, Treating RN: 02-26-1925 (81 y.o. [name] Date of Birth/Sex: Female) Other Clinician: Primary Care Physician: BABAOFF, MARCUS Treating Christin Fudge Referring Physician: Crecencio Mc Physician/Extender: Weeks in Treatment: 0 Verbal / Phone Orders: Yes Clinician: Afful, RN, BSN, Rita Read Back and Verified: Yes Diagnosis Coding Wound Cleansing Wound #1 Left,Anterior Lower Leg o Cleanse wound with mild soap and water o May Shower, gently pat wound dry prior to applying new dressing. Wound #2 Right,Anterior Lower Leg o Cleanse wound with mild soap and water o May Shower, gently pat wound dry prior to applying new dressing. Primary Wound Dressing Wound #1 Left,Anterior Lower Leg o Iodosorb Ointment Wound #2 Right,Anterior Lower Leg o Iodosorb Ointment Secondary Dressing Wound #1 Left,Anterior Lower Leg o Gauze and Kerlix/Conform Wound #2 Right,Anterior Lower Leg o Gauze and Kerlix/Conform Dressing Change Frequency Wound #1 Left,Anterior Lower Leg o Change dressing every other day. Wound #  2 Right,Anterior Lower Leg o Change dressing every other day. Follow-up Appointments Wound #1 Left,Anterior Lower Leg o Return Appointment in 2 weeks. - March 21, 2015 AVRIEL, KANDEL [place]. ([id number]) Wound #2 Right,Anterior Lower Leg o Return Appointment in 2 weeks. - March 21, 2015 Electronic Signature(s) Signed: 03/10/2015 2:13:13 PM By: Regan Lemming BSN, RN Signed: 03/10/2015 4:17:16 PM By: Christin Fudge MD, FACS Entered By: Regan Lemming on 03/10/2015 14:13:13 Southern, [name] ([id number]) -------------------------------------------------------------------------------- Problem List Details Patient Name: Verga, Merilynn M. Date of Service: 03/10/2015 1:30 PM Medical Record Patient Account Number:  [id number] Number: Afful, RN, BSN, Treating RN: [birth year] (81 y.o. 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 Date of Service: 03/10/2015 1:30 PM Medical Record Patient Account Number: [id number] Number: Afful, RN, BSN, Treating RN: [birth year] (81 y.o. [name] Date of Birth/Sex: Female) Other Clinician: Primary Care Physician: BABAOFF, MARCUS Treating Christin Fudge Referring Physician: Crecencio Mc Physician/Extender: Weeks in Treatment: 0 Subjective Chief Complaint Information obtained from Patient Patient presents to the wound care center for a consult due non healing wound She had a fall and injured both lower extremities 8 weeks ago History of Present Illness (HPI) The following HPI elements were documented for the patient's wound: Location: both lower legs Quality: Patient reports experiencing a sharp pain to  affected area(s). Severity: Patient states wound are getting worse. Duration: Patient has had the wound for < 8 weeks prior to presenting for treatment Timing: Pain in wound is constant (hurts all the time) Context: The wound occurred when the patient tripped and fell and had lacerations on both lower extremities Modifying Factors: Other treatment(s) tried include:seen in the ER recently and was put on doxycycline Associated Signs and Symptoms: Patient reports having difficulty standing for long periods. pleasant 81 year old patient who had a lacerated wound to both lower extremities about 8 weeks ago and has been treating it with local antibody, ointment. She was seen by her oncologist recently who referred her to a surgeon for an unassociated problem and also asked her to get some x-rays which did not show any bone involvement. The patient has recently been put on doxycycline and some pain medications. She has multiple myeloma and is been on treatment for this for several years. Wound History Patient presents with 2 open wounds that have been present for approximately 7weeks. Patient has been treating wounds in the following manner: SSD. Laboratory tests have been performed in the last month. Patient reportedly has tested positive for an antibiotic resistant organism. Patient reportedly has not tested positive for osteomyelitis. Patient reportedly has not had testing performed to evaluate circulation in the legs. Patient experiences the following problems associated with their wounds: infection. Patient History Information obtained from Patient, Chart. Allergies Mcalexander, Maghen  M. ([id number]) Biaxin, Demerol, hydrocodone, Sulfa (Sulfonamide Antibiotics), tramadol, Ultracet Family History Cancer - Mother, Heart Disease - Father, No family history of Hereditary Spherocytosis, Hypertension, Kidney Disease, Lung Disease, Seizures, Stroke, Thyroid Problems, Tuberculosis. Social  History Never smoker, Alcohol Use - Moderate - 2 glasses of wine per week, Drug Use - No History, Caffeine Use - Moderate. Medical History Ear/Nose/Mouth/Throat Denies history of Chronic sinus problems/congestion, Middle ear problems Hematologic/Lymphatic Patient has history of Anemia Denies history of Hemophilia, Human Immunodeficiency Virus, Lymphedema, Sickle Cell Disease Respiratory Denies history of Aspiration, Asthma, Chronic Obstructive Pulmonary Disease (COPD), Pneumothorax, Sleep Apnea, Tuberculosis Cardiovascular Patient has history of Arrhythmia - afib, Coronary Artery Disease, Hypertension Gastrointestinal Denies history of Cirrhosis , Colitis, Crohn s, Hepatitis A, Hepatitis B, Hepatitis C Endocrine Denies history of Type I Diabetes, Type II Diabetes Genitourinary Denies history of End Stage Renal Disease Immunological Denies history of Lupus Erythematosus, Raynaud s, Scleroderma Integumentary (Skin) Denies history of History of Burn, History of pressure wounds Musculoskeletal Patient has history of Osteoarthritis Neurologic Denies history of Dementia, Neuropathy, Quadriplegia, Paraplegia, Seizure Disorder Oncologic Patient has history of Received Chemotherapy Psychiatric Denies history of Anorexia/bulimia, Confinement Anxiety Medical And Surgical History Notes Constitutional Symptoms (General Health) anxiety; Coronory arteriosclerosis; Hypertension; Hyperlipidemia; Arthritis; A-fib; Apendectomy; Hysterectomy; total knee revision; partial colectomy Cardiovascular blood transfusion Gastrointestinal appendectomy, partial colectomy Oncologic Koranda, Tytionna M. ([id number]) multiple myeloma Review of Systems (ROS) Constitutional Symptoms (General Health) The patient has no complaints or symptoms. Ear/Nose/Mouth/Throat The patient has no complaints or symptoms. Respiratory The patient has no complaints or symptoms. Cardiovascular The patient has no complaints  or symptoms. Gastrointestinal The patient has no complaints or symptoms. Endocrine The patient has no complaints or symptoms. Genitourinary The patient has no complaints or symptoms. Immunological The patient has no complaints or symptoms. Integumentary (Skin) Complains or has symptoms of Wounds, Bleeding or bruising tendency, Breakdown. Musculoskeletal The patient has no complaints or symptoms. Neurologic The patient has no complaints or symptoms. Psychiatric Complains or has symptoms of Anxiety. Medications dexamethasone oral oral Adult Low Dose Aspirin 81 mg tablet,delayed release oral tablet,delayed release (DR/EC) oral fluticasone 50 mcg/actuation nasal spray,suspension nasal spray,suspension nasal ibuprofen 800 mg tablet oral tablet oral doxycycline hyclate 100 mg capsule oral 1 capsule oral twice daily Bactroban 2 % topical cream topical cream topical mupirocin 2 % topical ointment topical ointment topical biotin 1 mg capsule oral capsule oral Objective Constitutional Pulse regular. Respirations normal and unlabored. Afebrile. Spagna, Damoni M. ([id number]) Vitals Time Taken: 1:41 PM, Height: 62 in, Source: Stated, Weight: 103 lbs, Source: Stated, BMI: 18.8, Temperature: 98.1 F, Pulse: 91 bpm, Respiratory Rate: 17 breaths/min, Blood Pressure: 97/64 mmHg. Eyes Nonicteric. Reactive to light. Ears, Nose, Mouth, and Throat Lips, teeth, and gums WNL.Marland Kitchen Moist mucosa without lesions . Neck supple and nontender. No palpable supraclavicular or cervical adenopathy. Normal sized without goiter. Respiratory WNL. No retractions.. Cardiovascular Pedal Pulses WNL. ABI could not be measured because of the tenderness both lower legs. No clubbing, cyanosis or edema. Gastrointestinal (GI) Abdomen without masses or tenderness.. No liver or spleen enlargement or tenderness.. Lymphatic No adneopathy. No adenopathy. No adenopathy. Musculoskeletal Adexa without tenderness or  enlargement.. Digits and nails w/o clubbing, cyanosis, infection, petechiae, ischemia, or inflammatory conditions.Marland Kitchen Psychiatric Judgement and insight Intact.. No evidence of depression, anxiety, or agitation.. General Notes: She has significant abrasions both lower extremities down to her subcutaneous tissue and they're covered with slough and eschar. Integumentary (Hair, Skin) No suspicious lesions. No crepitus or fluctuance. No peri-wound warmth  or erythema. No masses.. Wound #1 status is Open. Original cause of wound was Trauma. The wound is located on the Left,Anterior Lower Leg. The wound measures 1.2cm length x 1.2cm width x 0.1cm depth; 1.131cm^2 area and 0.113cm^3 volume. The wound is limited to skin breakdown. There is no tunneling noted. There is a medium amount of serosanguineous drainage noted. The wound margin is distinct with the outline attached to the wound base. There is medium (34-66%) pink, pale granulation within the wound bed. There is a medium (34-66%) amount of necrotic tissue within the wound bed including Adherent Slough. The periwound skin appearance exhibited: Moist. The periwound skin appearance did not exhibit: Callus, Crepitus, Excoriation, Fluctuance, Friable, Induration, Localized Edema, Rash, Scarring, Dry/Scaly, Maceration, Atrophie Yoshito Gaza, Cyanosis, Ecchymosis, Hemosiderin Staining, Mottled, Pallor, Rubor, Erythema. Periwound temperature was noted as No Abnormality. The periwound has tenderness on Pasquarelli, Audris M. ([id number]) palpation. Wound #2 status is Open. Original cause of wound was Trauma. The wound is located on the Right,Anterior Lower Leg. The wound measures 1.3cm length x 1cm width x 0.1cm depth; 1.021cm^2 area and 0.102cm^3 volume. The wound is limited to skin breakdown. There is no tunneling or undermining noted. There is a medium amount of serosanguineous drainage noted. The wound margin is distinct with the outline attached to the wound  base. There is medium (34-66%) pink, pale granulation within the wound bed. There is a medium (34-66%) amount of necrotic tissue within the wound bed including Adherent Slough. The periwound skin appearance exhibited: Moist. The periwound skin appearance did not exhibit: Callus, Crepitus, Excoriation, Fluctuance, Friable, Induration, Localized Edema, Rash, Scarring, Dry/Scaly, Maceration, Atrophie Jordanna Hendrie, Cyanosis, Ecchymosis, Hemosiderin Staining, Mottled, Pallor, Rubor, Erythema. Periwound temperature was noted as No Abnormality. The periwound has tenderness on palpation. 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 There was a Skin/Subcutaneous Tissue Debridement ([id number]) debridement with total area of 1.44 sq cm performed by Pat Patrick., MD. with the following instrument(s): Curette to remove Non-Viable tissue/material including Exudate, Fibrin/Slough, and Subcutaneous after achieving pain control using Lidocaine 4% Topical Solution. A time out was conducted prior to the start of the procedure. A Minimum amount of bleeding was controlled with Pressure. The procedure was tolerated well with a pain level of 4 throughout and a pain level of 4 following the procedure. Post Debridement Measurements: 1.2cm length x 1.2cm width x 0.1cm depth; 0.113cm^3 volume. Post procedure Diagnosis Wound #1: Same as Pre-Procedure Wound #2 Wound #2 is a Trauma, Other located on the Right,Anterior Lower Leg . There was a Skin/Subcutaneous Tissue Debridement  ([id number]) debridement with total area of 1.3 sq cm performed by Pat Patrick., Olexa, [name] ([id number]) MD. with the following instrument(s): Curette to remove Non-Viable tissue/material including Exudate, Fibrin/Slough, and Subcutaneous after achieving pain control using Lidocaine 4% Topical Solution. A time out was conducted prior to the start of the procedure. A Minimum amount of bleeding was controlled with Pressure. The procedure was tolerated well with a pain level of 4 throughout and a pain level of 4 following the procedure. Post Debridement Measurements: 1.3cm length x 1cm width x 0.1cm depth; 0.102cm^3  volume. Post procedure Diagnosis Wound #2: Same as Pre-Procedure The wound is rather dry and eschar covered after debriding it as recommended and his appointment and bordered foam over this as she is going to be out of town and won't be back for over 10 days. We have discussed her showering and doing the dressing and all details have been discussed with her and her caregiver. Will finish her course of antibiotics Plan Wound Cleansing: Wound #1 Left,Anterior Lower Leg: Cleanse wound with mild soap and water May Shower, gently pat wound dry prior to applying new dressing. Wound #2 Right,Anterior Lower Leg: Cleanse wound with mild soap and water May Shower, gently pat wound dry prior to applying new dressing. Primary Wound Dressing: Wound #1 Left,Anterior Lower Leg: Iodosorb Ointment Wound #2 Right,Anterior Lower Leg: Iodosorb Ointment Secondary Dressing: Wound #1 Left,Anterior Lower Leg: Gauze and Kerlix/Conform Wound #2 Right,Anterior Lower Leg: Gauze and Kerlix/Conform Dressing Change Frequency: Wound #1 Left,Anterior Lower Leg: Change dressing every other day. Wound #2 Right,Anterior Lower Leg: Change dressing every other day. Follow-up Appointments: Wound #1 Left,Anterior Lower Leg: Return Appointment in 2 weeks. - March 21, 2015 Wound #2 Right,Anterior  Lower Leg: Rill, Jimmi M. ([id number]) Return Appointment in 2 weeks. - March 21, 2015 The wound is rather dry and eschar covered after debriding it as recommended and his appointment and bordered foam over this as she is going to be out of town and won't be back for over 10 days. We have discussed her showering and doing the dressing and all details have been discussed with her and her caregiver. Will finish her course of antibiotics. She will see me when she is back from her vacation. Electronic Signature(s) Signed: 03/10/2015 3:20:13 PM By: Christin Fudge MD, FACS Entered By: Christin Fudge on 03/10/2015 15:20:13 Straus, [name] ([id number]) -------------------------------------------------------------------------------- ROS/PFSH Details Patient Name: Depass, Debe Coder M. Date of Service: 03/10/2015 1:30 PM Medical Record Number: [id number] Patient Account Number: [id number] Date of Birth/Sex: 04-Jun-1925 (81 y.o. Female) Treating RN: Cornell Barman Primary Care Physician: Derinda Late Other Clinician: Referring Physician: Crecencio Mc Treating Physician/Extender: Frann Rider in Treatment: 0 Information Obtained From Patient Chart Wound History Do you currently have one or more open woundso Yes How many open wounds do you currently haveo 2 Approximately how long have you had your woundso 7weeks How have you been treating your wound(s) until nowo SSD Has your wound(s) ever healed and then re-openedo No Have you tested positive for osteomyelitis (bone infection)o No Have you had any tests for circulation on your legso No Have you had other problems associated with your woundso Infection Integumentary (Skin) Complaints and Symptoms: Positive for: Wounds; Bleeding or bruising tendency; Breakdown Medical History: Negative for: History of Burn; History of pressure wounds Psychiatric Complaints and Symptoms: Positive for: Anxiety Medical History: Negative for:  Anorexia/bulimia; Confinement Anxiety Constitutional Symptoms (General Health) Complaints and Symptoms: No Complaints or Symptoms Medical History: Past Medical History Notes: anxiety; Coronory arteriosclerosis; Hypertension; Hyperlipidemia; Arthritis; A-fib; Apendectomy; Hysterectomy; total knee revision; partial colectomy Ear/Nose/Mouth/Throat Complaints and Symptoms: No Complaints or Symptoms Clardy, Giani M. ([id number]) Medical History: Negative for: Chronic sinus problems/congestion; Middle ear problems Hematologic/Lymphatic Medical History: Positive for: Anemia Negative for: Hemophilia; Human Immunodeficiency Virus; Lymphedema; Sickle Cell Disease Respiratory Complaints and Symptoms: No Complaints or Symptoms Medical History: Negative for: Aspiration; Asthma; Chronic Obstructive Pulmonary Disease (COPD); Pneumothorax; Sleep Apnea; Tuberculosis Cardiovascular Complaints and Symptoms: No Complaints or Symptoms Medical History: Positive for: Arrhythmia - afib; Coronary Artery Disease; Hypertension Past Medical  History Notes: blood transfusion Gastrointestinal Complaints and Symptoms: No Complaints or Symptoms Medical History: Negative for: Cirrhosis ; Colitis; Crohnos; Hepatitis A; Hepatitis B; Hepatitis C Past Medical History Notes: appendectomy, partial colectomy Endocrine Complaints and Symptoms: No Complaints or Symptoms Medical History: Negative for: Type I Diabetes; Type II Diabetes Genitourinary Complaints and Symptoms: No Complaints or Symptoms [place], Arminta M. ([id number]) Medical History: Negative for: End Stage Renal Disease Immunological Complaints and Symptoms: No Complaints or Symptoms Medical History: Negative for: Lupus Erythematosus; Raynaudos; Scleroderma Musculoskeletal Complaints and Symptoms: No Complaints or Symptoms Medical History: Positive for: Osteoarthritis Neurologic Complaints and Symptoms: No Complaints or Symptoms Medical  History: Negative for: Dementia; Neuropathy; Quadriplegia; Paraplegia; Seizure Disorder Oncologic Medical History: Positive for: Received Chemotherapy Past Medical History Notes: multiple myeloma Family and Social History Cancer: Yes - Mother; Heart Disease: Yes - Father; Hereditary Spherocytosis: No; Hypertension: No; Kidney Disease: No; Lung Disease: No; Seizures: No; Stroke: No; Thyroid Problems: No; Tuberculosis: No; Never smoker; Alcohol Use: Moderate - 2 glasses of wine per week; Drug Use: No History; Caffeine Use: Moderate; Financial Concerns: No; Food, Clothing or Shelter Needs: No; Support System Lacking: No; Transportation Concerns: No Physician Affirmation I have reviewed and agree with the above information. Electronic Signature(s) Signed: 03/10/2015 3:09:43 PM By: Christin Fudge MD, FACS Signed: 03/10/2015 4:53:29 PM By: Gretta Cool RN, BSN, Kim RN, BSN Previous Signature: 03/10/2015 1:28:52 PM Version By: Regan Lemming BSN, RN Previous Signature: 03/10/2015 1:24:50 PM Version By: Regan Lemming BSN, RN Previous Signature: 03/10/2015 8:41:38 AM Version By: Gretta Cool RN, BSN, Kim RN, BSN Entered By: Christin Fudge on 03/10/2015 15:09:42 Terra, Royelle M. ([id number]) Avey, [name] ([id number]) -------------------------------------------------------------------------------- SuperBill Details Patient Name: Nauta, Samoria M. Date of Service: 03/10/2015 Medical Record Patient Account Number: [id number] Number: Afful, RN, BSN, Treating RN: 12-04-1924 (81 y.o. 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 Facility Procedures CPT4 Code Description: [id number] - WOUND CARE VISIT-LEV 3 EST PT Modifier: Quantity: 1 CPT4 Code Description: [id number] 11042 - [name] SUBQ TISSUE 20 SQ CM/< ICD-10 Description Diagnosis S81.811A Laceration without foreign body, right lower leg, init S81.812A Laceration without foreign body, left lower leg, initi L97.212 Non-pressure chronic  ulcer of right calf with fat laye L97.222 Non-pressure chronic ulcer of left calf with fat layer Modifier: ial encounter al encounter r exposed exposed Quantity: 1 Physician Procedures CPT4 Code Description: [id number] - WC PHYS LEVEL 4 - NEW PT ICD-10 Description Diagnosis S81.811A Laceration without foreign body, right lower leg, init S81.812A Laceration without foreign body, left lower leg, initi L97.212 Non-pressure chronic  ulcer of right calf with fat laye L97.222 Non-pressure chronic ulcer of left calf with fat layer Modifier: ial encounter al encounter r exposed exposed Quantity: 1 CPT4 Code Description: [id number] 11042 - WC PHYS SUBQ TISS 20 SQ CM Description Frisbie, Damary M. ([id number]) Modifier: Quantity: 1 Electronic Signature(s) Signed: 03/10/2015 3:20:41 PM By: Christin Fudge MD, FACS Entered By: Christin Fudge on 03/10/2015 15:20:41

## 2015-03-21 ENCOUNTER — Ambulatory Visit: Payer: Medicare Other | Admitting: General Surgery

## 2015-03-21 ENCOUNTER — Encounter: Payer: Medicare Other | Attending: Surgery | Admitting: Surgery

## 2015-03-21 DIAGNOSIS — S81812D Laceration without foreign body, left lower leg, subsequent encounter: Secondary | ICD-10-CM | POA: Insufficient documentation

## 2015-03-21 DIAGNOSIS — I2510 Atherosclerotic heart disease of native coronary artery without angina pectoris: Secondary | ICD-10-CM | POA: Insufficient documentation

## 2015-03-21 DIAGNOSIS — I4891 Unspecified atrial fibrillation: Secondary | ICD-10-CM | POA: Insufficient documentation

## 2015-03-21 DIAGNOSIS — C9000 Multiple myeloma not having achieved remission: Secondary | ICD-10-CM | POA: Insufficient documentation

## 2015-03-21 DIAGNOSIS — I10 Essential (primary) hypertension: Secondary | ICD-10-CM | POA: Insufficient documentation

## 2015-03-21 DIAGNOSIS — S81811D Laceration without foreign body, right lower leg, subsequent encounter: Secondary | ICD-10-CM | POA: Diagnosis not present

## 2015-03-21 DIAGNOSIS — X58XXXD Exposure to other specified factors, subsequent encounter: Secondary | ICD-10-CM | POA: Insufficient documentation

## 2015-03-21 DIAGNOSIS — L97212 Non-pressure chronic ulcer of right calf with fat layer exposed: Secondary | ICD-10-CM | POA: Insufficient documentation

## 2015-03-21 DIAGNOSIS — L97222 Non-pressure chronic ulcer of left calf with fat layer exposed: Secondary | ICD-10-CM | POA: Insufficient documentation

## 2015-03-21 DIAGNOSIS — M1990 Unspecified osteoarthritis, unspecified site: Secondary | ICD-10-CM | POA: Diagnosis not present

## 2015-03-21 DIAGNOSIS — S81811A Laceration without foreign body, right lower leg, initial encounter: Secondary | ICD-10-CM | POA: Diagnosis not present

## 2015-03-21 DIAGNOSIS — S81812A Laceration without foreign body, left lower leg, initial encounter: Secondary | ICD-10-CM | POA: Diagnosis not present

## 2015-03-22 NOTE — Progress Notes (Signed)
Lamartina, [name] ([id number]) Visit Report for 03/21/2015 Chief Complaint Document Details Patient Name: [name], [name]. 03/21/2015 10:15 Date of Service: AM Medical Record [id number] Number: Patient Account Number: [id number] 05-11-1925 (81 y.o. Treating RN: Montey Hora Date of Birth/Sex: Female) Other Clinician: Primary Care Physician: BABAOFF, MARCUS Treating Garyn Waguespack Referring Physician: Derinda Late Physician/Extender: Weeks in Treatment: 1 Information Obtained from: Patient Chief Complaint Patient presents to the wound care center for a consult due non healing wound She had a fall and injured both lower extremities 8 weeks ago Electronic Signature(s) Signed: 03/21/2015 11:18:43 AM By: Christin Fudge MD, FACS Entered By: Christin Fudge on 03/21/2015 11:18:43 [name], [name] ([id number]) -------------------------------------------------------------------------------- Debridement Details Patient Name: [name]. 03/21/2015 10:15 Date of Service: AM Medical Record [id number] Number: Patient Account Number: [id number] 01-06-1925 (81 y.o. Treating RN: Montey Hora Date of Birth/Sex: Female) Other Clinician: Primary Care Physician: BABAOFF, MARCUS Treating Jeyren Danowski Referring Physician: BABAOFF, MARCUS Physician/Extender: Weeks in Treatment: 1 Debridement Performed for Wound #1 Left,Anterior Lower Leg Assessment: Performed By: Physician Christin Fudge, MD Debridement: Debridement Pre-procedure Yes Verification/Time Out Taken: Start Time: 11:10 Pain Control: Lidocaine 4% Topical Solution Level: Skin/Subcutaneous Tissue Total Area Debrided (L x 0.9 (cm) x 0.9 (cm) = 0.81 (cm) W): Tissue and other Viable, Non-Viable, Fibrin/Slough, Other, Subcutaneous material debrided: Instrument: Forceps Bleeding: None End Time: 11:12 Procedural Pain: 0 Post Procedural Pain: 0 Response to Treatment: Procedure was tolerated well Post Debridement  Measurements of Total Wound Length: (cm) 0.9 Width: (cm) 0.9 Depth: (cm) 0.1 Volume: (cm) 0.064 Post Procedure Diagnosis Same as Pre-procedure Electronic Signature(s) Signed: 03/21/2015 11:18:29 AM By: Christin Fudge MD, FACS Signed: 03/21/2015 5:00:37 PM By: Montey Hora Entered By: Christin Fudge on 03/21/2015 11:18:29 [name], [name] ([id number]) -------------------------------------------------------------------------------- Debridement Details Patient Name: [name]. 03/21/2015 10:15 Date of Service: AM Medical Record [id number] Number: Patient Account Number: [id number] June 07, 1925 (81 y.o. Treating RN: Montey Hora Date of Birth/Sex: Female) Other Clinician: Primary Care Physician: BABAOFF, MARCUS Treating Divine Hansley Referring Physician: BABAOFF, MARCUS Physician/Extender: Weeks in Treatment: 1 Debridement Performed for Wound #2 Right,Anterior Lower Leg Assessment: Performed By: Physician Christin Fudge, MD Debridement: Debridement Pre-procedure Yes Verification/Time Out Taken: Start Time: 11:12 Pain Control: Lidocaine 4% Topical Solution Level: Skin/Subcutaneous Tissue Total Area Debrided (L x 1.4 (cm) x 1.3 (cm) = 1.82 (cm) W): Tissue and other Viable, Non-Viable, Fibrin/Slough, Other, Subcutaneous material debrided: Instrument: Forceps Bleeding: None End Time: 11:14 Procedural Pain: 0 Post Procedural Pain: 0 Response to Treatment: Procedure was tolerated well Post Debridement Measurements of Total Wound Length: (cm) 1.4 Width: (cm) 1.3 Depth: (cm) 0.1 Volume: (cm) 0.143 Post Procedure Diagnosis Same as Pre-procedure Electronic Signature(s) Signed: 03/21/2015 11:18:36 AM By: Christin Fudge MD, FACS Signed: 03/21/2015 5:00:37 PM By: Montey Hora Entered By: Christin Fudge on 03/21/2015 11:18:36 [name], [name] ([id number]) -------------------------------------------------------------------------------- HPI Details Patient Name:  [name]. 03/21/2015 10:15 Date of Service: AM Medical Record [id number] Number: Patient Account Number: [id number] 1924-11-24 (81 y.o. Treating RN: Montey Hora Date of Birth/Sex: Female) Other Clinician: Primary Care Physician: BABAOFF, MARCUS Treating Branon Sabine Referring Physician: BABAOFF, MARCUS Physician/Extender: Weeks in Treatment: 1 History of Present Illness Location: both lower legs Quality: Patient reports experiencing a sharp pain to affected area(s). Severity: Patient states wound are getting worse. Duration: Patient has had the wound for < 8 weeks prior to presenting for treatment Timing: Pain in wound is constant (hurts all the time) Context: The wound occurred when the patient tripped and fell and had lacerations  on both lower extremities Modifying Factors: Other treatment(s) tried include:seen in the ER recently and was put on doxycycline Associated Signs and Symptoms: Patient reports having difficulty standing for long periods. HPI Description: pleasant 81 year old patient who had a lacerated wound to both lower extremities about 8 weeks ago and has been treating it with local antibody, ointment. She was seen by her oncologist recently who referred her to a surgeon for an unassociated problem and also asked her to get some x-rays which did not show any bone involvement. The patient has recently been put on doxycycline and some pain medications. She has multiple myeloma and is been on treatment for this for several years. Electronic Signature(s) Signed: 03/21/2015 11:18:50 AM By: Christin Fudge MD, FACS Entered By: Christin Fudge on 03/21/2015 11:18:49 [name], [name] ([id number]) -------------------------------------------------------------------------------- Physical Exam Details Patient Name: [name]. 03/21/2015 10:15 Date of Service: AM Medical Record [id number] Number: Patient Account Number: [id number] 1924/11/18 (81 y.o. Treating  RN: Montey Hora Date of Birth/Sex: Female) Other Clinician: Primary Care Physician: BABAOFF, MARCUS Treating Zayda Angell Referring Physician: BABAOFF, MARCUS Physician/Extender: Weeks in Treatment: 1 Constitutional . Pulse regular. Respirations normal and unlabored. Afebrile. . Eyes Nonicteric. Reactive to light. Ears, Nose, Mouth, and Throat Lips, teeth, and gums WNL.Marland Kitchen Moist mucosa without lesions . Neck supple and nontender. No palpable supraclavicular or cervical adenopathy. Normal sized without goiter. Respiratory WNL. No retractions.. Cardiovascular Pedal Pulses WNL. No clubbing, cyanosis or edema. Lymphatic No adneopathy. No adenopathy. No adenopathy. Musculoskeletal Adexa without tenderness or enlargement.. Digits and nails w/o clubbing, cyanosis, infection, petechiae, ischemia, or inflammatory conditions.. Integumentary (Hair, Skin) No suspicious lesions. No crepitus or fluctuance. No peri-wound warmth or erythema. No masses.Marland Kitchen Psychiatric Judgement and insight Intact.. No evidence of depression, anxiety, or agitation.. Notes There is less slough covering the wounds and the eschar is significant at the sides. Debridement is going to be done sharply. Electronic Signature(s) Signed: 03/21/2015 11:19:49 AM By: Christin Fudge MD, FACS Entered By: Christin Fudge on 03/21/2015 11:19:48 [name], [name] ([id number]) -------------------------------------------------------------------------------- Physician Orders Details Patient Name: [name]. 03/21/2015 10:15 Date of Service: AM Medical Record [id number] Number: Patient Account Number: [id number] 12-02-1924 (81 y.o. Treating RN: Montey Hora Date of Birth/Sex: Female) Other Clinician: Primary Care Physician: BABAOFF, MARCUS Treating Bettey Muraoka Referring Physician: Derinda Late Physician/Extender: Weeks in Treatment: 1 Verbal / Phone Orders: Yes Clinician: Montey Hora Read Back and Verified:  Yes Diagnosis Coding Wound Cleansing Wound #1 Left,Anterior Lower Leg o Cleanse wound with mild soap and water o May Shower, gently pat wound dry prior to applying new dressing. Wound #2 Right,Anterior Lower Leg o Cleanse wound with mild soap and water o May Shower, gently pat wound dry prior to applying new dressing. Anesthetic Wound #1 Left,Anterior Lower Leg o Topical Lidocaine 4% cream applied to wound bed prior to debridement Wound #2 Right,Anterior Lower Leg o Topical Lidocaine 4% cream applied to wound bed prior to debridement Primary Wound Dressing Wound #1 Left,Anterior Lower Leg o Iodosorb Ointment Wound #2 Right,Anterior Lower Leg o Iodosorb Ointment Secondary Dressing Wound #1 Left,Anterior Lower Leg o Gauze and Kerlix/Conform Wound #2 Right,Anterior Lower Leg o Gauze and Kerlix/Conform Dressing Change Frequency Wound #1 Left,Anterior Lower Leg o Change dressing every other day. Opperman, Aanika [name]. ([id number]) Wound #2 Right,Anterior Lower Leg o Change dressing every other day. Follow-up Appointments Wound #1 Left,Anterior Lower Leg o Return Appointment in 1 week. Wound #2 Right,Anterior Lower Leg o Return Appointment in 1 week. Electronic Signature(s) Signed: 03/21/2015  4:43:21 PM By: Christin Fudge MD, FACS Signed: 03/21/2015 5:00:37 PM By: Montey Hora Entered By: Montey Hora on 03/21/2015 11:16:56 [name], [name] ([id number]) -------------------------------------------------------------------------------- Problem List Details Patient Name: [name]. 03/21/2015 10:15 Date of Service: AM Medical Record [id number] Number: Patient Account Number: [id number] 1925-06-04 (81 y.o. 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 03/21/2015 10:15 Date of Service: AM Medical Record [id number] Number: Patient Account Number: [id number] 06/15/1924 (81 y.o. Treating RN: Montey Hora Date of Birth/Sex: Female) Other Clinician: Primary Care Physician: BABAOFF, MARCUS Treating Janalynn Eder Referring Physician: BABAOFF, MARCUS Physician/Extender: Weeks in Treatment: 1 Subjective Chief Complaint Information obtained from Patient Patient presents to the wound care center for a consult due non healing wound She had a fall and injured both lower extremities 8 weeks ago History of Present Illness (HPI) The following HPI elements were documented for the patient's wound: Location: both lower legs Quality: Patient reports experiencing a sharp pain to affected area(s). Severity: Patient states wound are getting worse. Duration: Patient has had the wound for < 8 weeks prior to presenting for treatment Timing: Pain in wound is constant (hurts all the time) Context: The wound occurred when the patient tripped and fell and had lacerations on both lower extremities Modifying Factors: Other treatment(s) tried  include:seen in the ER recently and was put on doxycycline Associated Signs and Symptoms: Patient reports having difficulty standing for long periods. pleasant 81 year old patient who had a lacerated wound to both lower extremities about 8 weeks ago and has been treating it with local antibody, ointment. She was seen by her oncologist recently who referred her to a surgeon for an unassociated problem and also asked her to get some x-rays which did not show any bone involvement. The patient has recently been put on doxycycline and some pain medications. She has multiple myeloma and is been on treatment for this for several years. Objective Constitutional Pulse regular. Respirations normal and unlabored. Afebrile. Vitals Time Taken: 10:52 AM, Height: 62 in, Weight: 103 lbs, BMI: 18.8, Temperature: 98.3 F, Pulse: 73 bpm, Respiratory Rate: 16 breaths/min, Blood Pressure: 146/67 mmHg. Batterson, [name]. ([id number]) Eyes Nonicteric. Reactive to light. Ears, Nose, Mouth, and Throat Lips, teeth, and gums WNL.Marland Kitchen Moist mucosa without lesions . Neck supple and nontender. No palpable supraclavicular or cervical adenopathy. Normal sized without goiter. Respiratory WNL. No retractions.. Cardiovascular Pedal Pulses WNL. No clubbing, cyanosis or edema. Lymphatic No adneopathy. No adenopathy. No adenopathy. Musculoskeletal Adexa without tenderness or enlargement.. Digits and nails w/o clubbing, cyanosis, infection, petechiae, ischemia, or inflammatory conditions.Marland Kitchen Psychiatric Judgement and insight Intact.. No evidence of depression, anxiety, or agitation.. General Notes: There is less slough covering the wounds and the eschar is significant at the sides. Debridement is going to be done sharply. Integumentary (Hair, Skin) No  suspicious lesions. No crepitus or fluctuance. No peri-wound warmth or erythema. No masses.. Wound #1 status is Open. Original cause of wound was Trauma. The wound is located on  the Left,Anterior Lower Leg. The wound measures 0.9cm length x 0.9cm width x 0.1cm depth; 0.636cm^2 area and 0.064cm^3 volume. The wound is limited to skin breakdown. There is no tunneling or undermining noted. There is a medium amount of serosanguineous drainage noted. The wound margin is distinct with the outline attached to the wound base. There is medium (34-66%) pink, pale granulation within the wound bed. There is a medium (34-66%) amount of necrotic tissue within the wound bed including Adherent Slough. The periwound skin appearance exhibited: Moist. The periwound skin appearance did not exhibit: Callus, Crepitus, Excoriation, Fluctuance, Friable, Induration, Localized Edema, Rash, Scarring, Dry/Scaly, Maceration, Atrophie Blanche, Cyanosis, Ecchymosis, Hemosiderin Staining, Mottled, Pallor, Rubor, Erythema. Periwound temperature was noted as No Abnormality. The periwound has tenderness on palpation. Wound #2 status is Open. Original cause of wound was Trauma. The wound is located on the Right,Anterior Lower Leg. The wound measures 1.4cm length x 1.3cm width x 0.1cm depth; 1.429cm^2 area and 0.143cm^3 volume. The wound is limited to skin breakdown. There is no tunneling or undermining noted. There is a medium amount of serosanguineous drainage noted. The wound margin is distinct with the outline attached to the wound base. There is medium (34-66%) pink, pale granulation within the wound [name], [name]. ([id number]) bed. There is a medium (34-66%) amount of necrotic tissue within the wound bed including Adherent Slough. The periwound skin appearance exhibited: Moist. The periwound skin appearance did not exhibit: Callus, Crepitus, Excoriation, Fluctuance, Friable, Induration, Localized Edema, Rash, Scarring, Dry/Scaly, Maceration, Atrophie Blanche, Cyanosis, Ecchymosis, Hemosiderin Staining, Mottled, Pallor, Rubor, Erythema. Periwound temperature was noted as No Abnormality. The periwound  has tenderness on palpation. 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 I will continue with Iodosorb ointment and a foam dressing on top of this. She will come back and see me next week. Procedures Wound #1 Wound #1 is a Trauma, Other located on the Left,Anterior Lower Leg . There was a Skin/Subcutaneous Tissue Debridement ([id number]) debridement with total area of 0.81 sq cm performed by Christin Fudge, MD. with the following instrument(s): Forceps to remove Viable and Non-Viable tissue/material including Fibrin/Slough, Other, and Subcutaneous after achieving pain control using Lidocaine 4% Topical Solution. A time out was conducted prior to the start of the procedure. There was no bleeding. The procedure was tolerated well with a pain level of 0 throughout and a pain level of 0 following the procedure. Post Debridement Measurements: 0.9cm length x 0.9cm width x 0.1cm depth; 0.064cm^3 volume. Post procedure Diagnosis Wound #1: Same as Pre-Procedure Wound #2 Wound #2 is a Trauma, Other located on the Right,Anterior Lower Leg . There was a Skin/Subcutaneous Tissue Debridement ([id number]) debridement with total area of 1.82 sq cm performed by Christin Fudge, [name], [name]. ([id number]) MD. with the following instrument(s): Forceps to remove Viable and Non-Viable tissue/material including Fibrin/Slough, Other, and Subcutaneous after achieving pain control using Lidocaine 4% Topical Solution. A time  out was conducted prior to  the start of the procedure. There was no bleeding. The procedure was tolerated well with a pain level of 0 throughout and a pain level of 0 following the procedure. Post Debridement Measurements: 1.4cm length x 1.3cm width x 0.1cm depth; 0.143cm^3 volume. Post procedure Diagnosis Wound #2: Same as Pre-Procedure Plan Wound Cleansing: Wound #1 Left,Anterior Lower Leg: Cleanse wound with mild soap and water May Shower, gently pat wound dry prior to applying new dressing. Wound #2 Right,Anterior Lower Leg: Cleanse wound with mild soap and water May Shower, gently pat wound dry prior to applying new dressing. Anesthetic: Wound #1 Left,Anterior Lower Leg: Topical Lidocaine 4% cream applied to wound bed prior to debridement Wound #2 Right,Anterior Lower Leg: Topical Lidocaine 4% cream applied to wound bed prior to debridement Primary Wound Dressing: Wound #1 Left,Anterior Lower Leg: Iodosorb Ointment Wound #2 Right,Anterior Lower Leg: Iodosorb Ointment Secondary Dressing: Wound #1 Left,Anterior Lower Leg: Gauze and Kerlix/Conform Wound #2 Right,Anterior Lower Leg: Gauze and Kerlix/Conform Dressing Change Frequency: Wound #1 Left,Anterior Lower Leg: Change dressing every other day. Wound #2 Right,Anterior Lower Leg: Change dressing every other day. Follow-up Appointments: Wound #1 Left,Anterior Lower Leg: Return Appointment in 1 week. Wound #2 Right,Anterior Lower Leg: Return Appointment in 1 week. Gasser, Virgie [name]. ([id number]) After sharply debriding this area the base of the ulcer is looking fairly clean and all the eschar has been removed. I will continue with Iodosorb ointment and a foam dressing on top of this. She will come back and see me next week. Electronic Signature(s) Signed: 03/21/2015 11:20:54 AM By: Christin Fudge MD, FACS Entered By: Christin Fudge on 03/21/2015 11:20:54 Sgroi, [name]  ([id number]) -------------------------------------------------------------------------------- SuperBill Details Patient Name: Kellenberger, Brehanna [name]. Date of Service: 03/21/2015 Medical Record Number: [id number] Patient Account Number: [id number] Date of Birth/Sex: 09-10-1924 (81 y.o. 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 Facility Procedures CPT4 Code Description: [id number] 11042 - DEB SUBQ TISSUE 20 SQ CM/< ICD-10 Description Diagnosis S81.811A Laceration without foreign body, right lower leg, init S81.812A Laceration without foreign body, left lower leg, initi L97.212 Non-pressure chronic  ulcer of right calf with fat laye L97.222 Non-pressure chronic ulcer of left calf with fat layer Modifier: ial encounte al encounter r exposed exposed Quantity: 1 r Physician Procedures CPT4 Code Description: [id number] 11042 - WC PHYS SUBQ TISS 20 SQ CM ICD-10 Description Diagnosis S81.811A Laceration without foreign body, right lower leg, init S81.812A Laceration without foreign body, left lower leg, initi L97.212 Non-pressure chronic  ulcer of right calf with fat laye L97.222 Non-pressure chronic ulcer of left calf with fat layer Modifier: ial encounte al encounter r exposed exposed Quantity: 1 r Electronic Signature(s) Signed: 03/21/2015 11:21:17 AM By: Christin Fudge MD, FACS Entered By: Christin Fudge on 03/21/2015 11:21:16

## 2015-03-22 NOTE — Progress Notes (Signed)
[name], [name] ([id number]) Visit Report for 03/21/2015 Arrival Information Details Patient Name: [name], [name]. Date of Service: 03/21/2015 10:15 AM Medical Record Number: [id number] Patient Account Number: [id number] Date of Birth/Sex: 1925/01/25 (81 y.o. Female) Treating RN: Montey Hora Primary Care Physician: Derinda Late Other Clinician: Referring Physician: BABAOFF, MARCUS Treating Physician/Extender: Frann Rider in Treatment: 1 Visit Information History Since Last Visit Added or deleted any medications: No Patient Arrived: Cane Any new allergies or adverse reactions: No Arrival Time: 10:50 Had a fall or experienced change in No Accompanied By: friend activities of daily living that may affect Transfer Assistance: None risk of falls: Patient Identification Verified: Yes Signs or symptoms of abuse/neglect since last No Secondary Verification Process Completed: Yes visito Patient Requires Transmission-Based No Hospitalized since last visit: No Precautions: Pain Present Now: No Patient Has Alerts: No Electronic Signature(s) Signed: 03/21/2015 5:00:37 PM By: Montey Hora Entered By: Montey Hora on 03/21/2015 10:51:53 Territo, [name] ([id number]) -------------------------------------------------------------------------------- Encounter Discharge Information Details Patient Name: [name], [name] Date of Service: 03/21/2015 10:15 AM Medical Record Number: [id number] Patient Account Number: [id number] Date of Birth/Sex: [birth year] (81 y.o. Female) Treating RN: Montey Hora Primary Care Physician: BABAOFF, MARCUS Other Clinician: Referring Physician: BABAOFF, MARCUS Treating Physician/Extender: Frann Rider in Treatment: 1 Encounter Discharge Information Items Discharge Pain Level: 0 Discharge Condition: Stable Ambulatory Status: Cane Discharge Destination: Home Transportation: Private Auto Accompanied By: friend Schedule Follow-up  Appointment: Yes Medication Reconciliation completed and provided to Patient/Care No Genie Wenke: Provided on Clinical Summary of Care: 03/21/2015 Form Type Recipient Paper Patient MM Electronic Signature(s) Signed: 03/21/2015 11:28:47 AM By: Ruthine Dose Entered By: Ruthine Dose on 03/21/2015 11:28:46 Peffley, Wealthy M. ([id number]) -------------------------------------------------------------------------------- Lower Extremity Assessment Details Patient Name: [name], [name] Date of Service: 03/21/2015 10:15 AM Medical Record Number: [id number] Patient Account Number: [id number] Date of Birth/Sex: Oct 19, 1924 (81 y.o. Female) Treating RN: Montey Hora Primary Care Physician: BABAOFF, MARCUS Other Clinician: Referring Physician: BABAOFF, MARCUS Treating Physician/Extender: Frann Rider in Treatment: 1 Edema Assessment Assessed: [Left: No] [Right: No] E[Left: dema] [Right: :] Calf Left: Right: Point of Measurement: 32 cm From Medial Instep cm cm Ankle Left: Right: Point of Measurement: 8 cm From Medial Instep cm cm Vascular Assessment Pulses: Posterior Tibial Dorsalis Pedis Palpable: [Left:Yes] [Right:Yes] Extremity colors, hair growth, and conditions: Extremity Color: [Left:Mottled] [Right:Mottled] Hair Growth on Extremity: [Left:No] [Right:No] Temperature of Extremity: [Left:Warm] [Right:Warm] Capillary Refill: [Left:< 3 seconds] [Right:< 3 seconds] Toe Nail Assessment Left: Right: Thick: No No Discolored: No No Deformed: No No Improper Length and Hygiene: No No Electronic Signature(s) Signed: 03/21/2015 5:00:37 PM By: Montey Hora Entered By: Montey Hora on 03/21/2015 10:59:33 Sporn, Kelci M. ([id number]) -------------------------------------------------------------------------------- Multi Wound Chart Details Patient Name: [name], [name] Date of Service: 03/21/2015 10:15 AM Medical Record Number: [id number] Patient Account Number:  [id number] Date of Birth/Sex: August 10, 1924 (81 y.o. Female) Treating RN: Montey Hora Primary Care Physician: BABAOFF, MARCUS Other Clinician: Referring Physician: BABAOFF, MARCUS Treating Physician/Extender: Frann Rider in Treatment: 1 Vital Signs Height(in): 62 Pulse(bpm): 73 Weight(lbs): 103 Blood Pressure 146/67 (mmHg): Body Mass Index(BMI): 19 Temperature(F): 98.3 Respiratory Rate 16 (breaths/min): Photos: [1:No Photos] [2:No Photos] [N/A:N/A] Wound Location: [1:Left Lower Leg - Anterior] [2:Right Lower Leg - Anterior N/A] Wounding Event: [1:Trauma] [2:Trauma] [N/A:N/A] Primary Etiology: [1:Trauma, Other] [2:Trauma, Other] [N/A:N/A] Comorbid History: [1:Anemia, Arrhythmia, Coronary Artery Disease, Hypertension, Osteoarthritis, Received Chemotherapy] [2:Anemia, Arrhythmia, Coronary Artery Disease, Hypertension, Osteoarthritis, Received Chemotherapy] [N/A:N/A] Date Acquired: [1:02/07/2015] [2:02/07/2015] [N/A:N/A] Weeks of Treatment: [1:1] [2:1] [N/A:N/A] Wound  Status: [1:Open] [2:Open] [N/A:N/A] Measurements L x W x D 0.9x0.9x0.1 [2:1.4x1.3x0.1] [N/A:N/A] (cm) Area (cm) : [1:0.636] [2:1.429] [N/A:N/A] Volume (cm) : [1:0.064] [2:0.143] [N/A:N/A] % Reduction in Area: [1:43.80%] [2:-40.00%] [N/A:N/A] % Reduction in Volume: 43.40% [2:-40.20%] [N/A:N/A] Classification: [1:Full Thickness Without Exposed Support Structures] [2:Full Thickness Without Exposed Support Structures] [N/A:N/A] Exudate Amount: [1:Medium] [2:Medium] [N/A:N/A] Exudate Type: [1:Serosanguineous] [2:Serosanguineous] [N/A:N/A] Exudate Color: [1:red, brown] [2:red, brown] [N/A:N/A] Wound Margin: [1:Distinct, outline attached] [2:Distinct, outline attached] [N/A:N/A] Granulation Amount: [1:Medium (34-66%)] [2:Medium (34-66%)] [N/A:N/A] Granulation Quality: [1:Pink, Pale] [2:Pink, Pale] [N/A:N/A] Necrotic Amount: [1:Medium (34-66%)] [2:Medium (34-66%)] [N/A:N/A] Exposed Structures: [1:Fascia: No Fat:  No] [2:Fascia: No Fat: No] [N/A:N/A] Tendon: No Tendon: No Muscle: No Muscle: No Joint: No Joint: No Bone: No Bone: No Limited to Skin Limited to Skin Breakdown Breakdown Epithelialization: None None N/A Periwound Skin Texture: Edema: No Edema: No N/A Excoriation: No Excoriation: No Induration: No Induration: No Callus: No Callus: No Crepitus: No Crepitus: No Fluctuance: No Fluctuance: No Friable: No Friable: No Rash: No Rash: No Scarring: No Scarring: No Periwound Skin Moist: Yes Moist: Yes N/A Moisture: Maceration: No Maceration: No Dry/Scaly: No Dry/Scaly: No Periwound Skin Color: Atrophie Blanche: No Atrophie Blanche: No N/A Cyanosis: No Cyanosis: No Ecchymosis: No Ecchymosis: No Erythema: No Erythema: No Hemosiderin Staining: No Hemosiderin Staining: No Mottled: No Mottled: No Pallor: No Pallor: No Rubor: No Rubor: No Temperature: No Abnormality No Abnormality N/A Tenderness on Yes Yes N/A Palpation: Wound Preparation: Ulcer Cleansing: Ulcer Cleansing: N/A Rinsed/Irrigated with Rinsed/Irrigated with Saline Saline Topical Anesthetic Topical Anesthetic Applied: Other: lidocaine Applied: Other: lidocaine 4% 4% Treatment Notes Electronic Signature(s) Signed: 03/21/2015 5:00:37 PM By: Montey Hora Entered By: Montey Hora on 03/21/2015 11:02:00 [name], [name] ([id number]) -------------------------------------------------------------------------------- [place] Details Patient Name: [name], [name] Date of Service: 03/21/2015 10:15 AM Medical Record Number: [id number] Patient Account Number: [id number] Date of Birth/Sex: [birth year] (81 y.o. Female) Treating RN: Montey Hora Primary Care Physician: BABAOFF, MARCUS Other Clinician: Referring Physician: BABAOFF, MARCUS Treating Physician/Extender: Frann Rider in Treatment: 1 Active Inactive Abuse / Safety / Falls / Self Care Management Nursing  Diagnoses: Impaired physical mobility Potential for falls Self care deficit: actual or potential Goals: Patient/caregiver will verbalize understanding of skin care regimen Date Initiated: 03/10/2015 Goal Status: Active Patient/caregiver will verbalize/demonstrate measure taken to improve self care Date Initiated: 03/10/2015 Goal Status: Active Patient/caregiver will verbalize/demonstrate measures taken to improve the patient's personal safety Date Initiated: 03/10/2015 Goal Status: Active Patient/caregiver will verbalize/demonstrate measures taken to prevent injury and/or falls Date Initiated: 03/10/2015 Goal Status: Active Patient/caregiver will verbalize/demonstrate understanding of what to do in case of emergency Date Initiated: 03/10/2015 Goal Status: Active Interventions: Assess fall risk on admission and as needed Assess self care needs on admission and as needed Provide education on basic hygiene Provide education on fall prevention Provide education on personal and home safety Provide education on safe transfers Treatment Activities: Education provided on Basic Hygiene : 03/10/2015 [name], [name] ([id number]) Notes: Orientation to the Wound Care Program Nursing Diagnoses: Knowledge deficit related to the wound healing center program Goals: Patient/caregiver will verbalize understanding of the [facility] Date Initiated: 03/10/2015 Goal Status: Active Interventions: Provide education on orientation to the wound center Notes: Wound/Skin Impairment Nursing Diagnoses: Impaired tissue integrity Knowledge deficit related to ulceration/compromised skin integrity Goals: Patient/caregiver will verbalize understanding of skin care regimen Date Initiated: 03/10/2015 Goal Status: Active Ulcer/skin breakdown will have a volume reduction of 30% by week 4 Date Initiated: 03/10/2015 Goal  Status: Active Ulcer/skin breakdown will have a volume reduction of 50% by  week 8 Date Initiated: 03/10/2015 Goal Status: Active Ulcer/skin breakdown will have a volume reduction of 80% by week 12 Date Initiated: 03/10/2015 Goal Status: Active Ulcer/skin breakdown will heal within 14 weeks Date Initiated: 03/10/2015 Goal Status: Active Interventions: Assess patient/caregiver ability to obtain necessary supplies Assess ulceration(s) every visit Provide education on ulcer and skin care Treatment Activities: [name], [name] ([id number]) Referred to [facility] for dressing supplies : 03/21/2015 Skin care regimen initiated : 03/21/2015 Topical wound management initiated : 03/21/2015 Notes: Electronic Signature(s) Signed: 03/21/2015 5:00:37 PM By: Montey Hora Entered By: Montey Hora on 03/21/2015 11:01:51 [name], [name] ([id number]) -------------------------------------------------------------------------------- Patient/Caregiver Education Details Patient Name: [name], Penny Coder M. Date of Service: 03/21/2015 10:15 AM Medical Record Number: [id number] Patient Account Number: [id number] Date of Birth/Gender: Jan 26, 1925 (81 y.o. Female) Treating RN: Montey Hora Primary Care Physician: BABAOFF, MARCUS Other Clinician: Referring Physician: BABAOFF, MARCUS Treating Physician/Extender: Frann Rider in Treatment: 1 Education Assessment Education Provided To: Patient and Caregiver Education Topics Provided Wound/Skin Impairment: Handouts: Other: wound care as ordered Methods: Demonstration, Explain/Verbal Responses: State content correctly Electronic Signature(s) Signed: 03/21/2015 5:00:37 PM By: Montey Hora Entered By: Montey Hora on 03/21/2015 11:27:44 Albornoz, Saffron M. ([id number]) -------------------------------------------------------------------------------- Wound Assessment Details Patient Name: Perrelli, Elky M. Date of Service: 03/21/2015 10:15 AM Medical Record Number: [id number] Patient Account Number: [id number] Date of  Birth/Sex: January 19, 1925 (81 y.o. Female) Treating RN: Montey Hora Primary Care Physician: BABAOFF, MARCUS Other Clinician: Referring Physician: BABAOFF, MARCUS Treating Physician/Extender: Frann Rider in Treatment: 1 Wound Status Wound Number: 1 Primary Trauma, Other Etiology: Wound Location: Left Lower Leg - Anterior Wound Open Wounding Event: Trauma Status: Date Acquired: 02/07/2015 Comorbid Anemia, Arrhythmia, Coronary Artery Weeks Of Treatment: 1 History: Disease, Hypertension, Osteoarthritis, Clustered Wound: No Received Chemotherapy Photos Photo Uploaded By: Montey Hora on 03/21/2015 13:03:20 Wound Measurements Length: (cm) 0.9 Width: (cm) 0.9 Depth: (cm) 0.1 Area: (cm) 0.636 Volume: (cm) 0.064 % Reduction in Area: 43.8% % Reduction in Volume: 43.4% Epithelialization: None Tunneling: No Undermining: No Wound Description Full Thickness Without Exposed Classification: Support Structures Wound Margin: Distinct, outline attached Exudate Medium Amount: Exudate Type: Serosanguineous Exudate Color: red, brown Foul Odor After Cleansing: No Wound Bed Granulation Amount: Medium (34-66%) Exposed Structure Granulation Quality: Pink, Pale Fascia Exposed: No Menzer, Jolean M. ([id number]) Necrotic Amount: Medium (34-66%) Fat Layer Exposed: No Necrotic Quality: Adherent Slough Tendon Exposed: No Muscle Exposed: No Joint Exposed: No Bone Exposed: No Limited to Skin Breakdown Periwound Skin Texture Texture Color No Abnormalities Noted: No No Abnormalities Noted: No Callus: No Atrophie Blanche: No Crepitus: No Cyanosis: No Excoriation: No Ecchymosis: No Fluctuance: No Erythema: No Friable: No Hemosiderin Staining: No Induration: No Mottled: No Localized Edema: No Pallor: No Rash: No Rubor: No Scarring: No Temperature / Pain Moisture Temperature: No Abnormality No Abnormalities Noted: No Tenderness on Palpation: Yes Dry / Scaly:  No Maceration: No Moist: Yes Wound Preparation Ulcer Cleansing: Rinsed/Irrigated with Saline Topical Anesthetic Applied: Other: lidocaine 4%, Treatment Notes Wound #1 (Left, Anterior Lower Leg) 1. Cleansed with: Clean wound with Normal Saline 2. Anesthetic Topical Lidocaine 4% cream to wound bed prior to debridement 4. Dressing Applied: Iodosorb Ointment 5. Secondary Dressing Applied Gauze and Kerlix/Conform 7. Secured with Recruitment consultant) Signed: 03/21/2015 5:00:37 PM By: Montey Hora Entered By: Montey Hora on 03/21/2015 11:00:44 Lague, Livianna M. ([id number]) Rainford, [name] ([id number]) -------------------------------------------------------------------------------- Wound Assessment Details Patient Name: Antilla, Alle M. Date of Service:  03/21/2015 10:15 AM Medical Record Number: [id number] Patient Account Number: [id number] Date of Birth/Sex: Feb 17, 1925 (81 y.o. Female) Treating RN: Montey Hora Primary Care Physician: BABAOFF, MARCUS Other Clinician: Referring Physician: BABAOFF, MARCUS Treating Physician/Extender: Frann Rider in Treatment: 1 Wound Status Wound Number: 2 Primary Trauma, Other Etiology: Wound Location: Right Lower Leg - Anterior Wound Open Wounding Event: Trauma Status: Date Acquired: 02/07/2015 Comorbid Anemia, Arrhythmia, Coronary Artery Weeks Of Treatment: 1 History: Disease, Hypertension, Osteoarthritis, Clustered Wound: No Received Chemotherapy Photos Photo Uploaded By: Montey Hora on 03/21/2015 13:03:53 Wound Measurements Length: (cm) 1.4 Width: (cm) 1.3 Depth: (cm) 0.1 Area: (cm) 1.429 Volume: (cm) 0.143 % Reduction in Area: -40% % Reduction in Volume: -40.2% Epithelialization: None Tunneling: No Undermining: No Wound Description Full Thickness Without Exposed Classification: Support Structures Wound Margin: Distinct, outline attached Exudate Medium Amount: Exudate Type:  Serosanguineous Exudate Color: red, brown Foul Odor After Cleansing: No Wound Bed Granulation Amount: Medium (34-66%) Exposed Structure Granulation Quality: Pink, Pale Fascia Exposed: No Bastien, Kaylianna M. ([id number]) Necrotic Amount: Medium (34-66%) Fat Layer Exposed: No Necrotic Quality: Adherent Slough Tendon Exposed: No Muscle Exposed: No Joint Exposed: No Bone Exposed: No Limited to Skin Breakdown Periwound Skin Texture Texture Color No Abnormalities Noted: No No Abnormalities Noted: No Callus: No Atrophie Blanche: No Crepitus: No Cyanosis: No Excoriation: No Ecchymosis: No Fluctuance: No Erythema: No Friable: No Hemosiderin Staining: No Induration: No Mottled: No Localized Edema: No Pallor: No Rash: No Rubor: No Scarring: No Temperature / Pain Moisture Temperature: No Abnormality No Abnormalities Noted: No Tenderness on Palpation: Yes Dry / Scaly: No Maceration: No Moist: Yes Wound Preparation Ulcer Cleansing: Rinsed/Irrigated with Saline Topical Anesthetic Applied: Other: lidocaine 4%, Treatment Notes Wound #2 (Right, Anterior Lower Leg) 1. Cleansed with: Clean wound with Normal Saline 2. Anesthetic Topical Lidocaine 4% cream to wound bed prior to debridement 4. Dressing Applied: Iodosorb Ointment 5. Secondary Dressing Applied Gauze and Kerlix/Conform 7. Secured with Recruitment consultant) Signed: 03/21/2015 5:00:37 PM By: Montey Hora Entered By: Montey Hora on 03/21/2015 11:00:55 Jeune, [name] ([id number]) Jedlicka, [name] ([id number]) -------------------------------------------------------------------------------- Vitals Details Patient Name: Banbury, Tedra M. Date of Service: 03/21/2015 10:15 AM Medical Record Number: [id number] Patient Account Number: [id number] Date of Birth/Sex: 1925/03/20 (81 y.o. Female) Treating RN: Montey Hora Primary Care Physician: BABAOFF, MARCUS Other Clinician: Referring Physician: BABAOFF,  MARCUS Treating Physician/Extender: Frann Rider in Treatment: 1 Vital Signs Time Taken: 10:52 Temperature (F): 98.3 Height (in): 62 Pulse (bpm): 73 Weight (lbs): 103 Respiratory Rate (breaths/min): 16 Body Mass Index (BMI): 18.8 Blood Pressure (mmHg): 146/67 Reference Range: 80 - 120 mg / dl Electronic Signature(s) Signed: 03/21/2015 5:00:37 PM By: Montey Hora Entered By: Montey Hora on 03/21/2015 10:53:42

## 2015-03-28 ENCOUNTER — Encounter: Payer: Medicare Other | Admitting: Surgery

## 2015-03-28 DIAGNOSIS — L97222 Non-pressure chronic ulcer of left calf with fat layer exposed: Secondary | ICD-10-CM | POA: Diagnosis not present

## 2015-03-28 DIAGNOSIS — S81811D Laceration without foreign body, right lower leg, subsequent encounter: Secondary | ICD-10-CM | POA: Diagnosis not present

## 2015-03-28 DIAGNOSIS — I2510 Atherosclerotic heart disease of native coronary artery without angina pectoris: Secondary | ICD-10-CM | POA: Diagnosis not present

## 2015-03-28 DIAGNOSIS — S81811A Laceration without foreign body, right lower leg, initial encounter: Secondary | ICD-10-CM | POA: Diagnosis not present

## 2015-03-28 DIAGNOSIS — S81812D Laceration without foreign body, left lower leg, subsequent encounter: Secondary | ICD-10-CM | POA: Diagnosis not present

## 2015-03-28 DIAGNOSIS — L97212 Non-pressure chronic ulcer of right calf with fat layer exposed: Secondary | ICD-10-CM | POA: Diagnosis not present

## 2015-03-28 DIAGNOSIS — C9000 Multiple myeloma not having achieved remission: Secondary | ICD-10-CM | POA: Diagnosis not present

## 2015-03-28 DIAGNOSIS — S81812A Laceration without foreign body, left lower leg, initial encounter: Secondary | ICD-10-CM | POA: Diagnosis not present

## 2015-03-29 ENCOUNTER — Inpatient Hospital Stay: Payer: Medicare Other | Attending: Oncology

## 2015-03-29 ENCOUNTER — Encounter: Payer: Self-pay | Admitting: Oncology

## 2015-03-29 ENCOUNTER — Inpatient Hospital Stay (HOSPITAL_BASED_OUTPATIENT_CLINIC_OR_DEPARTMENT_OTHER): Payer: Medicare Other | Admitting: Oncology

## 2015-03-29 VITALS — BP 152/80 | HR 71 | Temp 96.4°F | Wt 101.9 lb

## 2015-03-29 DIAGNOSIS — T50995S Adverse effect of other drugs, medicaments and biological substances, sequela: Secondary | ICD-10-CM | POA: Diagnosis not present

## 2015-03-29 DIAGNOSIS — I4891 Unspecified atrial fibrillation: Secondary | ICD-10-CM | POA: Diagnosis not present

## 2015-03-29 DIAGNOSIS — L538 Other specified erythematous conditions: Secondary | ICD-10-CM | POA: Insufficient documentation

## 2015-03-29 DIAGNOSIS — F419 Anxiety disorder, unspecified: Secondary | ICD-10-CM | POA: Insufficient documentation

## 2015-03-29 DIAGNOSIS — I2510 Atherosclerotic heart disease of native coronary artery without angina pectoris: Secondary | ICD-10-CM | POA: Diagnosis not present

## 2015-03-29 DIAGNOSIS — C9002 Multiple myeloma in relapse: Secondary | ICD-10-CM

## 2015-03-29 DIAGNOSIS — S81802S Unspecified open wound, left lower leg, sequela: Secondary | ICD-10-CM

## 2015-03-29 DIAGNOSIS — I10 Essential (primary) hypertension: Secondary | ICD-10-CM

## 2015-03-29 DIAGNOSIS — S81801S Unspecified open wound, right lower leg, sequela: Secondary | ICD-10-CM | POA: Diagnosis not present

## 2015-03-29 DIAGNOSIS — M87180 Osteonecrosis due to drugs, jaw: Secondary | ICD-10-CM | POA: Diagnosis not present

## 2015-03-29 DIAGNOSIS — X58XXXS Exposure to other specified factors, sequela: Secondary | ICD-10-CM | POA: Insufficient documentation

## 2015-03-29 DIAGNOSIS — M064 Inflammatory polyarthropathy: Secondary | ICD-10-CM | POA: Insufficient documentation

## 2015-03-29 DIAGNOSIS — Z79899 Other long term (current) drug therapy: Secondary | ICD-10-CM | POA: Diagnosis not present

## 2015-03-29 DIAGNOSIS — E785 Hyperlipidemia, unspecified: Secondary | ICD-10-CM | POA: Insufficient documentation

## 2015-03-29 DIAGNOSIS — M069 Rheumatoid arthritis, unspecified: Secondary | ICD-10-CM | POA: Insufficient documentation

## 2015-03-29 LAB — CBC WITH DIFFERENTIAL/PLATELET
BASOS ABS: 0 10*3/uL (ref 0–0.1)
Basophils Relative: 1 %
Eosinophils Absolute: 0.1 10*3/uL (ref 0–0.7)
Eosinophils Relative: 1 %
HEMATOCRIT: 38.3 % (ref 35.0–47.0)
Hemoglobin: 12.8 g/dL (ref 12.0–16.0)
LYMPHS PCT: 12 %
Lymphs Abs: 0.5 10*3/uL — ABNORMAL LOW (ref 1.0–3.6)
MCH: 31.4 pg (ref 26.0–34.0)
MCHC: 33.4 g/dL (ref 32.0–36.0)
MCV: 94.1 fL (ref 80.0–100.0)
Monocytes Absolute: 0.6 10*3/uL (ref 0.2–0.9)
Monocytes Relative: 14 %
NEUTROS ABS: 3.1 10*3/uL (ref 1.4–6.5)
NEUTROS PCT: 72 %
Platelets: 231 10*3/uL (ref 150–440)
RBC: 4.07 MIL/uL (ref 3.80–5.20)
RDW: 15 % — ABNORMAL HIGH (ref 11.5–14.5)
WBC: 4.3 10*3/uL (ref 3.6–11.0)

## 2015-03-29 LAB — COMPREHENSIVE METABOLIC PANEL
ALBUMIN: 3.9 g/dL (ref 3.5–5.0)
ALT: 12 U/L — AB (ref 14–54)
AST: 19 U/L (ref 15–41)
Alkaline Phosphatase: 75 U/L (ref 38–126)
Anion gap: 6 (ref 5–15)
BILIRUBIN TOTAL: 0.6 mg/dL (ref 0.3–1.2)
BUN: 14 mg/dL (ref 6–20)
CHLORIDE: 105 mmol/L (ref 101–111)
CO2: 26 mmol/L (ref 22–32)
CREATININE: 0.94 mg/dL (ref 0.44–1.00)
Calcium: 8.3 mg/dL — ABNORMAL LOW (ref 8.9–10.3)
GFR calc Af Amer: >60 mL/min (ref >60)
GFR, EST NON AFRICAN AMERICAN: 53 mL/min — AB (ref >60)
GLUCOSE: 81 mg/dL (ref 65–99)
Potassium: 4.2 mmol/L (ref 3.5–5.1)
Sodium: 137 mmol/L (ref 135–145)
TOTAL PROTEIN: 6.9 g/dL (ref 6.5–8.1)

## 2015-03-29 MED ORDER — POMALYST 4 MG PO CAPS: 4.0000 mg | ORAL_CAPSULE | Freq: Every day | ORAL | Status: DC

## 2015-03-29 NOTE — Progress Notes (Signed)
Palau, SAHEJ HAUSWIRTH ([id number]) Visit Report for 03/28/2015 Arrival Information Details Patient Name: [name], [name]. Date of Service: 03/28/2015 11:00 AM Medical Record Number: [id number] Patient Account Number: [id number] Date of Birth/Sex: [birth year] (81 y.o. Female) Treating RN: Montey Hora Primary Care Physician: Derinda Late Other Clinician: Referring Physician: BABAOFF, MARCUS Treating Physician/Extender: Frann Rider in Treatment: 2 Visit Information History Since Last Visit Added or deleted any medications: No Patient Arrived: Cane Any new allergies or adverse reactions: No Arrival Time: 11:22 Had a fall or experienced change in No Accompanied By: friend activities of daily living that may affect Transfer Assistance: None risk of falls: Patient Identification Verified: Yes Signs or symptoms of abuse/neglect since last No Secondary Verification Process Completed: Yes visito Patient Requires Transmission-Based No Hospitalized since last visit: No Precautions: Pain Present Now: No Patient Has Alerts: No Electronic Signature(s) Signed: 03/28/2015 5:20:05 PM By: Montey Hora Entered By: Montey Hora on 03/28/2015 11:27:01 [name], [name] ([id number]) -------------------------------------------------------------------------------- Encounter Discharge Information Details Patient Name: [name], [name] Date of Service: 03/28/2015 11:00 AM Medical Record Number: [id number] Patient Account Number: [id number] Date of Birth/Sex: 03/21/1925 (81 y.o. Female) Treating RN: Montey Hora Primary Care Physician: BABAOFF, MARCUS Other Clinician: Referring Physician: BABAOFF, MARCUS Treating Physician/Extender: Frann Rider in Treatment: 2 Encounter Discharge Information Items Discharge Pain Level: 0 Discharge Condition: Stable Ambulatory Status: Cane Discharge Destination: Home Transportation: Private Auto Accompanied By: friend Schedule Follow-up  Appointment: Yes Medication Reconciliation completed and provided to Patient/Care No Jaxsyn Azam: Provided on Clinical Summary of Care: 03/28/2015 Form Type Recipient Paper Patient MM Electronic Signature(s) Signed: 03/28/2015 11:53:11 AM By: Ruthine Dose Entered By: Ruthine Dose on 03/28/2015 11:53:11 Wysong, Kennadee M. ([id number]) -------------------------------------------------------------------------------- Lower Extremity Assessment Details Patient Name: Talavera, Aubree M. Date of Service: 03/28/2015 11:00 AM Medical Record Number: [id number] Patient Account Number: [id number] Date of Birth/Sex: [birth year] (81 y.o. Female) Treating RN: Montey Hora Primary Care Physician: BABAOFF, MARCUS Other Clinician: Referring Physician: BABAOFF, MARCUS Treating Physician/Extender: Frann Rider in Treatment: 2 Edema Assessment Assessed: [Left: No] [Right: No] Edema: [Left: No] [Right: No] Calf Left: Right: Point of Measurement: 32 cm From Medial Instep 31.8 cm 32.2 cm Ankle Left: Right: Point of Measurement: 8 cm From Medial Instep 18.7 cm 19 cm Vascular Assessment Pulses: Posterior Tibial Dorsalis Pedis Palpable: [Left:Yes] [Right:Yes] Extremity colors, hair growth, and conditions: Extremity Color: [Left:Normal] [Right:Normal] Hair Growth on Extremity: [Left:No] [Right:No] Temperature of Extremity: [Left:Warm] [Right:Warm] Capillary Refill: [Left:< 3 seconds] [Right:< 3 seconds] Toe Nail Assessment Left: Right: Thick: No No Discolored: No No Deformed: No No Improper Length and Hygiene: No No Electronic Signature(s) Signed: 03/28/2015 5:20:05 PM By: Montey Hora Entered By: Montey Hora on 03/28/2015 11:32:08 Raboin, [name] ([id number]) -------------------------------------------------------------------------------- Multi Wound Chart Details Patient Name: Kehm, Armenta M. Date of Service: 03/28/2015 11:00 AM Medical Record Number: [id number] Patient  Account Number: [id number] Date of Birth/Sex: 1924-11-05 (81 y.o. Female) Treating RN: Montey Hora Primary Care Physician: BABAOFF, MARCUS Other Clinician: Referring Physician: BABAOFF, MARCUS Treating Physician/Extender: Frann Rider in Treatment: 2 Vital Signs Height(in): 62 Pulse(bpm): 96 Weight(lbs): 103 Blood Pressure 155/78 (mmHg): Body Mass Index(BMI): 19 Temperature(F): 98.1 Respiratory Rate 18 (breaths/min): Photos: [1:No Photos] [2:No Photos] [N/A:N/A] Wound Location: [1:Left Lower Leg - Anterior] [2:Right Lower Leg - Anterior N/A] Wounding Event: [1:Trauma] [2:Trauma] [N/A:N/A] Primary Etiology: [1:Trauma, Other] [2:Trauma, Other] [N/A:N/A] Comorbid History: [1:Anemia, Arrhythmia, Coronary Artery Disease, Hypertension, Osteoarthritis, Received Chemotherapy] [2:Anemia, Arrhythmia, Coronary Artery Disease, Hypertension, Osteoarthritis, Received Chemotherapy] [N/A:N/A] Date Acquired: [1:02/07/2015] [2:02/07/2015] [N/A:N/A] Weeks of  Treatment: [1:2] [2:2] [N/A:N/A] Wound Status: [1:Open] [2:Open] [N/A:N/A] Measurements L x W x D 0.7x0.6x0.1 [2:1x0.9x0.1] [N/A:N/A] (cm) Area (cm) : [1:0.33] [7:1.696] [N/A:N/A] Volume (cm) : [1:0.033] [2:0.071] [N/A:N/A] % Reduction in Area: [1:70.80%] [2:30.80%] [N/A:N/A] % Reduction in Volume: 70.80% [2:30.40%] [N/A:N/A] Classification: [1:Full Thickness Without Exposed Support Structures] [2:Full Thickness Without Exposed Support Structures] [N/A:N/A] Exudate Amount: [1:Medium] [2:Medium] [N/A:N/A] Exudate Type: [1:Serosanguineous] [2:Serosanguineous] [N/A:N/A] Exudate Color: [1:red, brown] [2:red, brown] [N/A:N/A] Wound Margin: [1:Distinct, outline attached] [2:Distinct, outline attached] [N/A:N/A] Granulation Amount: [1:Large (67-100%)] [2:Large (67-100%)] [N/A:N/A] Granulation Quality: [1:Pink, Pale] [2:Pink, Pale] [N/A:N/A] Necrotic Amount: [1:Small (1-33%)] [2:Small (1-33%)] [N/A:N/A] Exposed Structures: [1:Fascia:  No Fat: No] [2:Fascia: No Fat: No] [N/A:N/A] Tendon: No Tendon: No Muscle: No Muscle: No Joint: No Joint: No Bone: No Bone: No Limited to Skin Limited to Skin Breakdown Breakdown Epithelialization: None None N/A Periwound Skin Texture: Edema: No Edema: No N/A Excoriation: No Excoriation: No Induration: No Induration: No Callus: No Callus: No Crepitus: No Crepitus: No Fluctuance: No Fluctuance: No Friable: No Friable: No Rash: No Rash: No Scarring: No Scarring: No Periwound Skin Moist: Yes Moist: Yes N/A Moisture: Maceration: No Maceration: No Dry/Scaly: No Dry/Scaly: No Periwound Skin Color: Atrophie Blanche: No Atrophie Blanche: No N/A Cyanosis: No Cyanosis: No Ecchymosis: No Ecchymosis: No Erythema: No Erythema: No Hemosiderin Staining: No Hemosiderin Staining: No Mottled: No Mottled: No Pallor: No Pallor: No Rubor: No Rubor: No Temperature: No Abnormality No Abnormality N/A Tenderness on Yes Yes N/A Palpation: Wound Preparation: Ulcer Cleansing: Ulcer Cleansing: N/A Rinsed/Irrigated with Rinsed/Irrigated with Saline Saline Topical Anesthetic Topical Anesthetic Applied: Other: lidocaine Applied: Other: lidocaine 4% 4% Treatment Notes Electronic Signature(s) Signed: 03/28/2015 5:20:05 PM By: Montey Hora Entered By: Montey Hora on 03/28/2015 11:34:22 [name], [name] ([id number]) -------------------------------------------------------------------------------- [place] Details Patient Name: Obryan, Penny Coder M. Date of Service: 03/28/2015 11:00 AM Medical Record Number: [id number] Patient Account Number: [id number] Date of Birth/Sex: 1924/07/09 (81 y.o. Female) Treating RN: Montey Hora Primary Care Physician: BABAOFF, MARCUS Other Clinician: Referring Physician: BABAOFF, MARCUS Treating Physician/Extender: Frann Rider in Treatment: 2 Active Inactive Abuse / Safety / Falls / Self Care Management Nursing  Diagnoses: Impaired physical mobility Potential for falls Self care deficit: actual or potential Goals: Patient/caregiver will verbalize understanding of skin care regimen Date Initiated: 03/10/2015 Goal Status: Active Patient/caregiver will verbalize/demonstrate measure taken to improve self care Date Initiated: 03/10/2015 Goal Status: Active Patient/caregiver will verbalize/demonstrate measures taken to improve the patient's personal safety Date Initiated: 03/10/2015 Goal Status: Active Patient/caregiver will verbalize/demonstrate measures taken to prevent injury and/or falls Date Initiated: 03/10/2015 Goal Status: Active Patient/caregiver will verbalize/demonstrate understanding of what to do in case of emergency Date Initiated: 03/10/2015 Goal Status: Active Interventions: Assess fall risk on admission and as needed Assess self care needs on admission and as needed Provide education on basic hygiene Provide education on fall prevention Provide education on personal and home safety Provide education on safe transfers Treatment Activities: Education provided on Basic Hygiene : 03/10/2015 Chien, GURTHA PICKER ([id number]) Notes: Orientation to the Wound Care Program Nursing Diagnoses: Knowledge deficit related to the wound healing center program Goals: Patient/caregiver will verbalize understanding of the [place] Program Date Initiated: 03/10/2015 Goal Status: Active Interventions: Provide education on orientation to the wound center Notes: Wound/Skin Impairment Nursing Diagnoses: Impaired tissue integrity Knowledge deficit related to ulceration/compromised skin integrity Goals: Patient/caregiver will verbalize understanding of skin care regimen Date Initiated: 03/10/2015 Goal Status: Active Ulcer/skin breakdown will have a volume reduction of 30% by week  4 Date Initiated: 03/10/2015 Goal Status: Active Ulcer/skin breakdown will have a volume reduction of 50% by  week 8 Date Initiated: 03/10/2015 Goal Status: Active Ulcer/skin breakdown will have a volume reduction of 80% by week 12 Date Initiated: 03/10/2015 Goal Status: Active Ulcer/skin breakdown will heal within 14 weeks Date Initiated: 03/10/2015 Goal Status: Active Interventions: Assess patient/caregiver ability to obtain necessary supplies Assess ulceration(s) every visit Provide education on ulcer and skin care Treatment Activities: [name], [name] ([id number]) Referred to DME Shalissa Easterwood for dressing supplies : 03/28/2015 Skin care regimen initiated : 03/28/2015 Topical wound management initiated : 03/28/2015 Notes: Electronic Signature(s) Signed: 03/28/2015 5:20:05 PM By: Montey Hora Entered By: Montey Hora on 03/28/2015 11:34:15 Raia, [name] ([id number]) -------------------------------------------------------------------------------- Patient/Caregiver Education Details Patient Name: [name], Penny Coder M. Date of Service: 03/28/2015 11:00 AM Medical Record Number: [id number] Patient Account Number: [id number] Date of Birth/Gender: 1924-09-28 (81 y.o. Female) Treating RN: Montey Hora Primary Care Physician: BABAOFF, MARCUS Other Clinician: Referring Physician: BABAOFF, MARCUS Treating Physician/Extender: Frann Rider in Treatment: 2 Education Assessment Education Provided To: Patient Education Topics Provided Safety: Handouts: Other: better shoes for fall prevention Methods: Explain/Verbal Responses: State content correctly Wound/Skin Impairment: Handouts: Other: wound care as ordered Methods: Demonstration, Explain/Verbal Responses: State content correctly Electronic Signature(s) Signed: 03/28/2015 5:20:05 PM By: Montey Hora Entered By: Montey Hora on 03/28/2015 11:52:48 [name], [name] ([id number]) -------------------------------------------------------------------------------- Wound Assessment Details Patient Name: [name], [name] Date of  Service: 03/28/2015 11:00 AM Medical Record Number: [id number] Patient Account Number: [id number] Date of Birth/Sex: 1924-08-14 (81 y.o. Female) Treating RN: Montey Hora Primary Care Physician: BABAOFF, MARCUS Other Clinician: Referring Physician: BABAOFF, MARCUS Treating Physician/Extender: Frann Rider in Treatment: 2 Wound Status Wound Number: 1 Primary Trauma, Other Etiology: Wound Location: Left Lower Leg - Anterior Wound Open Wounding Event: Trauma Status: Date Acquired: 02/07/2015 Comorbid Anemia, Arrhythmia, Coronary Artery Weeks Of Treatment: 2 History: Disease, Hypertension, Osteoarthritis, Clustered Wound: No Received Chemotherapy Photos Photo Uploaded By: Montey Hora on 03/28/2015 12:04:09 Wound Measurements Length: (cm) 0.7 Width: (cm) 0.6 Depth: (cm) 0.1 Area: (cm) 0.33 Volume: (cm) 0.033 % Reduction in Area: 70.8% % Reduction in Volume: 70.8% Epithelialization: None Tunneling: No Undermining: No Wound Description Full Thickness Without Exposed Classification: Support Structures Wound Margin: Distinct, outline attached Exudate Medium Amount: Exudate Type: Serosanguineous Exudate Color: red, brown Foul Odor After Cleansing: No Wound Bed Granulation Amount: Large (67-100%) Exposed Structure Granulation Quality: Pink, Pale Fascia Exposed: No Makarewicz, [name] ([id number]) Necrotic Amount: Small (1-33%) Fat Layer Exposed: No Necrotic Quality: Adherent Slough Tendon Exposed: No Muscle Exposed: No Joint Exposed: No Bone Exposed: No Limited to Skin Breakdown Periwound Skin Texture Texture Color No Abnormalities Noted: No No Abnormalities Noted: No Callus: No Atrophie Blanche: No Crepitus: No Cyanosis: No Excoriation: No Ecchymosis: No Fluctuance: No Erythema: No Friable: No Hemosiderin Staining: No Induration: No Mottled: No Localized Edema: No Pallor: No Rash: No Rubor: No Scarring: No Temperature / Pain Moisture  Temperature: No Abnormality No Abnormalities Noted: No Tenderness on Palpation: Yes Dry / Scaly: No Maceration: No Moist: Yes Wound Preparation Ulcer Cleansing: Rinsed/Irrigated with Saline Topical Anesthetic Applied: Other: lidocaine 4%, Treatment Notes Wound #1 (Left, Anterior Lower Leg) 1. Cleansed with: Clean wound with Normal Saline 2. Anesthetic Topical Lidocaine 4% cream to wound bed prior to debridement 4. Dressing Applied: Iodoflex 5. Secondary Dressing Applied Gauze and Kerlix/Conform 7. Secured with Recruitment consultant) Signed: 03/28/2015 5:20:05 PM By: Montey Hora Entered By: Montey Hora on 03/28/2015 11:32:27 [name], [name] ([id number])  [name], [name] ([id number]) -------------------------------------------------------------------------------- Wound Assessment Details Patient Name: [name], [name] Date of Service: 03/28/2015 11:00 AM Medical Record Number: [id number] Patient Account Number: [id number] Date of Birth/Sex: 1925-04-04 (81 y.o. Female) Treating RN: Montey Hora Primary Care Physician: BABAOFF, MARCUS Other Clinician: Referring Physician: BABAOFF, MARCUS Treating Physician/Extender: Frann Rider in Treatment: 2 Wound Status Wound Number: 2 Primary Trauma, Other Etiology: Wound Location: Right Lower Leg - Anterior Wound Open Wounding Event: Trauma Status: Date Acquired: 02/07/2015 Comorbid Anemia, Arrhythmia, Coronary Artery Weeks Of Treatment: 2 History: Disease, Hypertension, Osteoarthritis, Clustered Wound: No Received Chemotherapy Photos Photo Uploaded By: Montey Hora on 03/28/2015 12:06:24 Wound Measurements Length: (cm) 1 Width: (cm) 0.9 Depth: (cm) 0.1 Area: (cm) 0.707 Volume: (cm) 0.071 % Reduction in Area: 30.8% % Reduction in Volume: 30.4% Epithelialization: None Tunneling: No Undermining: No Wound Description Full Thickness Without Exposed Classification: Support Structures Wound  Margin: Distinct, outline attached Exudate Medium Amount: Exudate Type: Serosanguineous Exudate Color: red, brown Foul Odor After Cleansing: No Wound Bed Granulation Amount: Large (67-100%) Exposed Structure Granulation Quality: Pink, Pale Fascia Exposed: No Galea, Shaune M. ([id number]) Necrotic Amount: Small (1-33%) Fat Layer Exposed: No Necrotic Quality: Adherent Slough Tendon Exposed: No Muscle Exposed: No Joint Exposed: No Bone Exposed: No Limited to Skin Breakdown Periwound Skin Texture Texture Color No Abnormalities Noted: No No Abnormalities Noted: No Callus: No Atrophie Blanche: No Crepitus: No Cyanosis: No Excoriation: No Ecchymosis: No Fluctuance: No Erythema: No Friable: No Hemosiderin Staining: No Induration: No Mottled: No Localized Edema: No Pallor: No Rash: No Rubor: No Scarring: No Temperature / Pain Moisture Temperature: No Abnormality No Abnormalities Noted: No Tenderness on Palpation: Yes Dry / Scaly: No Maceration: No Moist: Yes Wound Preparation Ulcer Cleansing: Rinsed/Irrigated with Saline Topical Anesthetic Applied: Other: lidocaine 4%, Treatment Notes Wound #2 (Right, Anterior Lower Leg) 1. Cleansed with: Clean wound with Normal Saline 2. Anesthetic Topical Lidocaine 4% cream to wound bed prior to debridement 4. Dressing Applied: Iodoflex 5. Secondary Dressing Applied Gauze and Kerlix/Conform 7. Secured with Recruitment consultant) Signed: 03/28/2015 5:20:05 PM By: Montey Hora Entered By: Montey Hora on 03/28/2015 11:32:38 Winders, [name] ([id number]) Soden, [name] ([id number]) -------------------------------------------------------------------------------- Vitals Details Patient Name: Weakland, Saphronia M. Date of Service: 03/28/2015 11:00 AM Medical Record Number: [id number] Patient Account Number: [id number] Date of Birth/Sex: [birth year] (81 y.o. Female) Treating RN: Montey Hora Primary Care Physician:  BABAOFF, MARCUS Other Clinician: Referring Physician: BABAOFF, MARCUS Treating Physician/Extender: Frann Rider in Treatment: 2 Vital Signs Time Taken: 11:27 Temperature (F): 98.1 Height (in): 62 Pulse (bpm): 96 Weight (lbs): 103 Respiratory Rate (breaths/min): 18 Body Mass Index (BMI): 18.8 Blood Pressure (mmHg): 155/78 Reference Range: 80 - 120 mg / dl Electronic Signature(s) Signed: 03/28/2015 5:20:05 PM By: Montey Hora Entered By: Montey Hora on 03/28/2015 11:27:44

## 2015-03-29 NOTE — Progress Notes (Signed)
[name], [name] ([id number]) Visit Report for 03/28/2015 Chief Complaint Document Details Patient Name: [name]. 03/28/2015 11:00 Date of Service: AM Medical Record [id number] Number: Patient Account Number: [id number] August 13, 1924 (82 y.o. Treating RN: Montey Hora Date of Birth/Sex: Female) Other Clinician: Primary Care Physician: BABAOFF, MARCUS Treating Skyler Dusing Referring Physician: Derinda Late Physician/Extender: Weeks in Treatment: 2 Information Obtained from: Patient Chief Complaint Patient presents to the wound care center for a consult due non healing wound She had a fall and injured both lower extremities 8 weeks ago Electronic Signature(s) Signed: 03/28/2015 12:09:50 PM By: Christin Fudge MD, FACS Entered By: Christin Fudge on 03/28/2015 12:09:49 [name], [name] ([id number]) -------------------------------------------------------------------------------- Debridement Details Patient Name: Penny Boss M. 03/28/2015 11:00 Date of Service: AM Medical Record [id number] Number: Patient Account Number: [id number] 1924-07-28 (81 y.o. Treating RN: Montey Hora Date of Birth/Sex: Female) Other Clinician: Primary Care Physician: BABAOFF, MARCUS Treating Zabian Swayne Referring Physician: Derinda Late Physician/Extender: Weeks in Treatment: 2 Debridement Performed for Wound #2 Right,Anterior Lower Leg Assessment: Performed By: Physician Christin Fudge, MD Debridement: Open Wound/Selective Debridement Selective Description: Pre-procedure Yes Verification/Time Out Taken: Start Time: 11:40 Pain Control: Lidocaine 4% Topical Solution Level: Skin/Epidermis Total Area Debrided (L x 1 (cm) x 0.9 (cm) = 0.9 (cm) W): Tissue and other Non-Viable, Eschar, Fibrin/Slough material debrided: Instrument: Forceps Bleeding: Minimum Hemostasis Achieved: Pressure End Time: 11:41 Procedural Pain: 0 Post Procedural Pain: 0 Response to Treatment:  Procedure was tolerated well Post Debridement Measurements of Total Wound Length: (cm) 1 Width: (cm) 0.9 Depth: (cm) 0.1 Volume: (cm) 0.071 Post Procedure Diagnosis Same as Pre-procedure Electronic Signature(s) Signed: 03/28/2015 12:09:31 PM By: Christin Fudge MD, FACS Signed: 03/28/2015 5:20:05 PM By: Montey Hora Entered By: Christin Fudge on 03/28/2015 12:09:30 D[place], [name] ([id number]) [name], [name] ([id number]) -------------------------------------------------------------------------------- Debridement Details Patient Name: Penny Boss M. 03/28/2015 11:00 Date of Service: AM Medical Record [id number] Number: Patient Account Number: [id number] June 09, 1925 (81 y.o. Treating RN: Montey Hora Date of Birth/Sex: Female) Other Clinician: Primary Care Physician: BABAOFF, MARCUS Treating Carmie Lanpher Referring Physician: Derinda Late Physician/Extender: Weeks in Treatment: 2 Debridement Performed for Wound #1 Left,Anterior Lower Leg Assessment: Performed By: Physician Christin Fudge, MD Debridement: Open Wound/Selective Debridement Selective Description: Pre-procedure Yes Verification/Time Out Taken: Start Time: 11:39 Pain Control: Lidocaine 4% Topical Solution Level: Skin/Epidermis Total Area Debrided (L x 0.7 (cm) x 0.6 (cm) = 0.42 (cm) W): Tissue and other Non-Viable, Eschar, Fibrin/Slough material debrided: Instrument: Forceps Bleeding: Minimum Hemostasis Achieved: Pressure End Time: 11:40 Procedural Pain: 0 Post Procedural Pain: 0 Response to Treatment: Procedure was tolerated well Post Debridement Measurements of Total Wound Length: (cm) 0.7 Width: (cm) 0.6 Depth: (cm) 0.1 Volume: (cm) 0.033 Post Procedure Diagnosis Same as Pre-procedure Electronic Signature(s) Signed: 03/28/2015 12:09:43 PM By: Christin Fudge MD, FACS Signed: 03/28/2015 5:20:05 PM By: Montey Hora Previous Signature: 03/28/2015 12:09:04 PM Version By: Christin Fudge  MD, FACS [name], [name] ([id number]) Entered By: Christin Fudge on 03/28/2015 12:09:43 [name], [name] ([id number]) -------------------------------------------------------------------------------- HPI Details Patient Name: Penny Boss M. 03/28/2015 11:00 Date of Service: AM Medical Record [id number] Number: Patient Account Number: [id number] 1924/06/25 (81 y.o. Treating RN: Montey Hora Date of Birth/Sex: Female) Other Clinician: Primary Care Physician: BABAOFF, MARCUS Treating Cerrone Debold Referring Physician: BABAOFF, MARCUS Physician/Extender: Weeks in Treatment: 2 History of Present Illness Location: both lower legs Quality: Patient reports experiencing a sharp pain to affected area(s). Severity: Patient states wound are getting worse. Duration: Patient has had the wound for < 8 weeks prior  to presenting for treatment Timing: Pain in wound is constant (hurts all the time) Context: The wound occurred when the patient tripped and fell and had lacerations on both lower extremities Modifying Factors: Other treatment(s) tried include:seen in the ER recently and was put on doxycycline Associated Signs and Symptoms: Patient reports having difficulty standing for long periods. HPI Description: pleasant 81 year old patient who had a lacerated wound to both lower extremities about 8 weeks ago and has been treating it with local antibody, ointment. She was seen by her oncologist recently who referred her to a surgeon for an unassociated problem and also asked her to get some x-rays which did not show any bone involvement. The patient has recently been put on doxycycline and some pain medications. She has multiple myeloma and is been on treatment for this for several years. Electronic Signature(s) Signed: 03/28/2015 12:09:54 PM By: Christin Fudge MD, FACS Entered By: Christin Fudge on 03/28/2015 12:09:54 [name], [name]  ([id number]) -------------------------------------------------------------------------------- Physical Exam Details Patient Name: Penny Boss M. 03/28/2015 11:00 Date of Service: AM Medical Record [id number] Number: Patient Account Number: [id number] 12-14-1924 (81 y.o. Treating RN: Montey Hora Date of Birth/Sex: Female) Other Clinician: Primary Care Physician: BABAOFF, MARCUS Treating Dina Mobley Referring Physician: BABAOFF, MARCUS Physician/Extender: Weeks in Treatment: 2 Constitutional . Pulse regular. Respirations normal and unlabored. Afebrile. . Eyes Nonicteric. Reactive to light. Ears, Nose, Mouth, and Throat Lips, teeth, and gums WNL.Marland Kitchen Moist mucosa without lesions . Neck supple and nontender. No palpable supraclavicular or cervical adenopathy. Normal sized without goiter. Respiratory WNL. No retractions.. Cardiovascular Pedal Pulses WNL. No clubbing, cyanosis or edema. Chest Breasts symmetical and no nipple discharge.. Breast tissue WNL, no masses, lumps, or tenderness.. Gastrointestinal (GI) Abdomen without masses or tenderness.. No liver or spleen enlargement or tenderness.. Genitourinary (GU) No hydrocele, spermatocele, tenderness of the cord, or testicular mass.Marland Kitchen Penis without lesions.Lowella Fairy without lesions. No cystocele, or rectocele. Pelvic support intact, no discharge. Marland Kitchen Urethra without masses, tenderness or scarring.Marland Kitchen Lymphatic No adneopathy. No adenopathy. No adenopathy. Musculoskeletal Adexa without tenderness or enlargement.. Digits and nails w/o clubbing, cyanosis, infection, petechiae, ischemia, or inflammatory conditions.. Integumentary (Hair, Skin) No suspicious lesions. No crepitus or fluctuance. No peri-wound warmth or erythema. No masses.Marland Kitchen Psychiatric Judgement and insight Intact.. No evidence of depression, anxiety, or agitation.. Notes there is minimal eschar over the edges of the wound and the actual wound is looking very  clean. [name], [name] ([id number]) Electronic Signature(s) Signed: 03/28/2015 12:10:28 PM By: Christin Fudge MD, FACS Entered By: Christin Fudge on 03/28/2015 12:10:27 [name], [name] ([id number]) -------------------------------------------------------------------------------- Physician Orders Details Patient Name: Penny Boss M. 03/28/2015 11:00 Date of Service: AM Medical Record [id number] Number: Patient Account Number: [id number] 01/01/1925 (81 y.o. Treating RN: Montey Hora Date of Birth/Sex: Female) Other Clinician: Primary Care Physician: BABAOFF, MARCUS Treating Loreli Debruler Referring Physician: Derinda Late Physician/Extender: Suella [place] in Treatment: 2 Verbal / Phone Orders: Yes Clinician: Montey Hora Read Back and Verified: Yes Diagnosis Coding Wound Cleansing Wound #1 Left,Anterior Lower Leg o Cleanse wound with mild soap and water o May Shower, gently pat wound dry prior to applying new dressing. Wound #2 Right,Anterior Lower Leg o Cleanse wound with mild soap and water o May Shower, gently pat wound dry prior to applying new dressing. Anesthetic Wound #1 Left,Anterior Lower Leg o Topical Lidocaine 4% cream applied to wound bed prior to debridement Wound #2 Right,Anterior Lower Leg o Topical Lidocaine 4% cream applied to wound bed prior to debridement Primary Wound Dressing Wound #1 Left,Anterior  Lower Leg o Iodosorb Ointment Wound #2 Right,Anterior Lower Leg o Iodosorb Ointment Secondary Dressing Wound #1 Left,Anterior Lower Leg o Gauze and Kerlix/Conform Wound #2 Right,Anterior Lower Leg o Gauze and Kerlix/Conform Dressing Change Frequency Wound #1 Left,Anterior Lower Leg o Change dressing every other day. [name], [name] ([id number]) Wound #2 Right,Anterior Lower Leg o Change dressing every other day. Follow-up Appointments Wound #1 Left,Anterior Lower Leg o Return Appointment in 1 week. Wound #2 Right,Anterior  Lower Leg o Return Appointment in 1 week. Electronic Signature(s) Signed: 03/28/2015 4:16:00 PM By: Christin Fudge MD, FACS Signed: 03/28/2015 5:20:05 PM By: Montey Hora Entered By: Montey Hora on 03/28/2015 11:41:28 Pasc[place], [name] ([id number]) -------------------------------------------------------------------------------- Problem List Details Patient Name: PEACE, NOYES M. 03/28/2015 11:00 Date of Service: AM Medical Record [id number] Number: Patient Account Number: [id number] [birth year] (81 y.o. 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 03/28/2015 11:00 Date of Service: AM Medical Record [id number] Number: Patient Account Number: [id number] Dec 14, 1924 (81 y.o. Treating RN: Montey Hora Date of Birth/Sex: Female) Other Clinician: Primary Care Physician: BABAOFF, MARCUS Treating Rosetta Rupnow Referring Physician: BABAOFF, MARCUS Physician/Extender: Weeks in Treatment:  2 Subjective Chief Complaint Information obtained from Patient Patient presents to the wound care center for a consult due non healing wound She had a fall and injured both lower extremities 8 weeks ago History of Present Illness (HPI) The following HPI elements were documented for the patient's wound: Location: both lower legs Quality: Patient reports experiencing a sharp pain to affected area(s). Severity: Patient states wound are getting worse. Duration: Patient has had the wound for < 8 weeks prior to presenting for treatment Timing: Pain in wound is constant (hurts all the time) Context: The wound occurred when the patient tripped and fell and had lacerations on both lower extremities Modifying Factors: Other treatment(s) tried include:seen in the ER recently and was put on doxycycline Associated Signs and Symptoms: Patient reports having difficulty standing for long periods. pleasant 81 year old patient who had a lacerated wound to both lower extremities about 8 weeks ago and has been treating it with local antibody, ointment. She was seen by her oncologist recently who referred her to a surgeon for an unassociated problem and also asked her to get some x-rays which did not show any bone involvement. The patient has recently been put on doxycycline and some pain medications. She has multiple myeloma and is been on treatment for this for several years. Objective Constitutional Pulse regular. Respirations normal and unlabored. Afebrile. Vitals Time Taken: 11:27 AM, Height: 62 in, Weight: 103 lbs, BMI: 18.8, Temperature: 98.1 F, Pulse: 96 bpm, Respiratory Rate: 18 breaths/min, Blood Pressure: 155/78 mmHg. [name], [name] ([id number]) Eyes Nonicteric. Reactive to light. Ears, Nose, Mouth, and Throat Lips, teeth, and gums WNL.Marland Kitchen Moist mucosa without lesions . Neck supple  and nontender. No palpable supraclavicular or cervical adenopathy. Normal sized without  goiter. Respiratory WNL. No retractions.. Cardiovascular Pedal Pulses WNL. No clubbing, cyanosis or edema. Chest Breasts symmetical and no nipple discharge.. Breast tissue WNL, no masses, lumps, or tenderness.. Gastrointestinal (GI) Abdomen without masses or tenderness.. No liver or spleen enlargement or tenderness.. Genitourinary (GU) No hydrocele, spermatocele, tenderness of the cord, or testicular mass.Marland Kitchen Penis without lesions.Lowella Fairy without lesions. No cystocele, or rectocele. Pelvic support intact, no discharge. Marland Kitchen Urethra without masses, tenderness or scarring.Marland Kitchen Lymphatic No adneopathy. No adenopathy. No adenopathy. Musculoskeletal Adexa without tenderness or enlargement.. Digits and nails w/o clubbing, cyanosis, infection, petechiae, ischemia, or inflammatory conditions.Marland Kitchen Psychiatric Judgement and insight Intact.. No evidence of depression, anxiety, or agitation.. General Notes: there is minimal eschar over the edges of the wound and the actual wound is looking very clean. Integumentary (Hair, Skin) No suspicious lesions. No crepitus or fluctuance. No peri-wound warmth or erythema. No masses.. Wound #1 status is Open. Original cause of wound was Trauma. The wound is located on the Left,Anterior Lower Leg. The wound measures 0.7cm length x 0.6cm width x 0.1cm depth; 0.33cm^2 area and 0.033cm^3 volume. The wound is limited to skin breakdown. There is no tunneling or undermining noted. There is a medium amount of serosanguineous drainage noted. The wound margin is distinct with the outline attached to the wound base. There is large (67-100%) pink, pale granulation within the wound bed. There is a small (1-33%) amount of necrotic tissue within the wound bed including Adherent Slough. The [name], [name] ([id number]) periwound skin appearance exhibited: Moist. The periwound skin appearance did not exhibit: Callus, Crepitus, Excoriation, Fluctuance, Friable, Induration, Localized  Edema, Rash, Scarring, Dry/Scaly, Maceration, Atrophie Blanche, Cyanosis, Ecchymosis, Hemosiderin Staining, Mottled, Pallor, Rubor, Erythema. Periwound temperature was noted as No Abnormality. The periwound has tenderness on palpation. Wound #2 status is Open. Original cause of wound was Trauma. The wound is located on the Right,Anterior Lower Leg. The wound measures 1cm length x 0.9cm width x 0.1cm depth; 0.707cm^2 area and 0.071cm^3 volume. The wound is limited to skin breakdown. There is no tunneling or undermining noted. There is a medium amount of serosanguineous drainage noted. The wound margin is distinct with the outline attached to the wound base. There is large (67-100%) pink, pale granulation within the wound bed. There is a small (1-33%) amount of necrotic tissue within the wound bed including Adherent Slough. The periwound skin appearance exhibited: Moist. The periwound skin appearance did not exhibit: Callus, Crepitus, Excoriation, Fluctuance, Friable, Induration, Localized Edema, Rash, Scarring, Dry/Scaly, Maceration, Atrophie Blanche, Cyanosis, Ecchymosis, Hemosiderin Staining, Mottled, Pallor, Rubor, Erythema. Periwound temperature was noted as No Abnormality. The periwound has tenderness on palpation. 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 There was a Skin/Epidermis Open Wound/Selective [phone]) debridement with total area of 0.42 sq cm performed by Christin Fudge, MD. with the following instrument(s): Forceps to remove Non-Viable  tissue/material including Fibrin/Slough and Eschar after achieving pain control using Lidocaine 4% Topical Solution. A time out was conducted prior to the start of the procedure. A Minimum amount of bleeding was controlled with Pressure.  The procedure was tolerated well with a pain level of 0 throughout and a pain level of 0 following the procedure. Post Debridement Measurements: 0.7cm length x 0.6cm width x 0.1cm depth; 0.033cm^3 volume. Post procedure Diagnosis Wound #1: Same as Pre-Procedure [name], [name] ([id number]) Wound #2 Wound #2 is a Trauma, Other located on the Right,Anterior Lower Leg . There was a Skin/Epidermis Open Wound/Selective [phone]) debridement with total area of 0.9 sq cm performed by Christin Fudge, MD. with the following instrument(s): Forceps to remove Non-Viable tissue/material including Fibrin/Slough and Eschar after achieving pain control using Lidocaine 4% Topical Solution. A time out was conducted prior to the start of the procedure. A Minimum amount of bleeding was controlled with Pressure. The procedure was tolerated well with a pain level of 0 throughout and a pain level of 0 following the procedure. Post Debridement Measurements: 1cm length x 0.9cm width x 0.1cm depth; 0.071cm^3 volume. Post procedure Diagnosis Wound #2: Same as Pre-Procedure Plan Wound Cleansing: Wound #1 Left,Anterior Lower Leg: Cleanse wound with mild soap and water May Shower, gently pat wound dry prior to applying new dressing. Wound #2 Right,Anterior Lower Leg: Cleanse wound with mild soap and water May Shower, gently pat wound dry prior to applying new dressing. Anesthetic: Wound #1 Left,Anterior Lower Leg: Topical Lidocaine 4% cream applied to wound bed prior to debridement Wound #2 Right,Anterior Lower Leg: Topical Lidocaine 4% cream applied to wound bed prior to debridement Primary Wound Dressing: Wound #1 Left,Anterior Lower Leg: Iodosorb Ointment Wound #2  Right,Anterior Lower Leg: Iodosorb Ointment Secondary Dressing: Wound #1 Left,Anterior Lower Leg: Gauze and Kerlix/Conform Wound #2 Right,Anterior Lower Leg: Gauze and Kerlix/Conform Dressing Change Frequency: Wound #1 Left,Anterior Lower Leg: Change dressing every other day. Wound #2 Right,Anterior Lower Leg: Change dressing every other day. Follow-up Appointments: Wound #1 Left,Anterior Lower Leg: Return Appointment in 1 week. Wound #2 Right,Anterior Lower Leg: Return Appointment in 1 week. Rylee, Cyann M. ([id number]) After sharply debriding this area the base of the ulcer is looking fairly clean and all the eschar has been removed. I will continue with Iodosorb ointment and a foam dressing on top of this. She will come back and see me next week. Electronic Signature(s) Signed: 03/28/2015 12:11:09 PM By: Christin Fudge MD, FACS Entered By: Christin Fudge on 03/28/2015 12:11:09 Weir, [name] ([id number]) -------------------------------------------------------------------------------- SuperBill Details Patient Name: Bittman, Skyla M. Date of Service: 03/28/2015 Medical Record Number: [id number] Patient Account Number: [id number] Date of Birth/Sex: 1925/04/16 (81 y.o. 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 Facility Procedures CPT4 Code Description: [id number] - DEBRIDE WOUND 1ST 20 SQ CM OR < ICD-10 Description Diagnosis  S81.811A Laceration without foreign body, right lower leg, init S81.812A Laceration without foreign body, left lower leg, initi C90.00 Multiple myeloma  not having achieved remission L97.212 Non-pressure chronic ulcer of right calf with fat laye Modifier: ial encounte al encounter r exposed Quantity: 1 r Physician Procedures CPT4 Code Description: [id number] - WC PHYS DEBR WO ANESTH 20 SQ CM ICD-10 Description Diagnosis S81.811A Laceration without foreign body, right lower leg, init S81.812A Laceration  without foreign body, left lower leg, initi C90.00 Multiple myeloma  not having achieved remission L97.212 Non-pressure chronic ulcer of right calf with fat laye Modifier: ial encounte al encounter r exposed Quantity: 1 r Electronic Signature(s) Signed: 03/28/2015 12:11:20 PM By: Christin Fudge MD, FACS Entered By: Christin Fudge on 03/28/2015 12:11:20

## 2015-04-01 ENCOUNTER — Encounter: Payer: Self-pay | Admitting: Oncology

## 2015-04-01 NOTE — Progress Notes (Signed)
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  INTERVAL HISTORY:  81 year old lady came today further follow-up for assessment of multiple myeloma. Patient also has nonhealing ulcers in both lower extremity being evaluated by wound care.  Center According to her wound is healing well particularly on the left side.  Right side is also feeling better No nausea no vomiting or diarrhea appetite has been improving.  No chills.  No fever. Patient has improved from the myelosuppression   REVIEW OF SYSTEMS:   Gen. status: Performance status is 2.  Patient is feeling better.  Appetite is improving.  HEENT: Patient has osteonecrosis of jaw bone.  Swelling on the left side of the neck is improved.  Lungs: Shortness of breath on exertion.  Cardiac: No chest pain no palpitation.  GI: Appetite is getting better no nausea no vomiting no diarrhea or rectal bleeding.  GU: No dysuria or hematuria.  Neurological system: Continues to no problem with maintaining balance.  No tingling.  No numbness.  Lower extremity no swellings.  Skin: No rash.  No skeletal system joint pains continue to bother patient perstatus is improving. Swelling in the left jaw has been All other systems have been reviewed O significant family history  As per HPI. Otherwise, a complete review of systems is negatve.  PAST MEDICAL HISTORY: Past Medical History  Diagnosis Date  . Osteonecrosis of jaw ([place])     left  . Anxiety   . Coronary arteriosclerosis   . Hypertension   . Hyperlipidemia   . Arthritis   . Atrial fibrillation  ([place])   . Multiple myeloma ([place])   . Multiple myeloma in relapse ([place]) 10/29/2014  . History of blood transfusion 2014    PAST SURGICAL HISTORY: Past Surgical History  Procedure Laterality Date  . Appendectomy    . Abdominal hysterectomy    . Total knee revision Left   . 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 Patient is postmenopausal.     ADVANCED DIRECTIVES:   Patient does have advance healthcare directive, Patient   does not desire to make any changes Patient did bring power of attorney and living will which has been copied and scanned in the chart HEALTH MAINTENANCE: Social History  Substance Use Topics  . Smoking status: Never Smoker   . Smokeless tobacco: Never Used  . Alcohol Use: 1.2 oz/week    2 Glasses of wine per week      Allergies  Allergen Reactions  . Biaxin [Clarithromycin] Diarrhea and Nausea And Vomiting  . Demerol [Meperidine] Nausea And Vomiting  . Hydrocodone Nausea And Vomiting  . Sulfa Antibiotics Hives  .  Tramadol Diarrhea and Nausea And Vomiting  . Ultracet [Tramadol-Acetaminophen] Other (See Comments)    Current Outpatient Prescriptions  Medication Sig Dispense Refill  . Biotin 1 MG CAPS Take by mouth.    . Dexamethasone (DECADRON PO) Take by mouth as directed. With pomalyst    . ibuprofen (ADVIL,MOTRIN) 800 MG tablet Take 1 tablet (800 mg total) by mouth every 8 (eight) hours as needed for moderate pain. 30 tablet 3  . POMALYST 4 MG capsule Take 1 capsule (4 mg total) by mouth daily. For 2 weeks then 2 weeks  off.  0   No current facility-administered medications for this visit.   Facility-Administered Medications Ordered in Other Visits  Medication Dose Route Frequency Provider Last Rate Last Dose  . sodium chloride 0.9 % injection 10 mL  10 mL Intravenous PRN Forest Gleason, MD   10 mL at 03/01/15 1305    OBJECTIVE:  Filed Vitals:   03/29/15 1222  BP: 152/80  Pulse: 71  Temp: 96.4 F (35.8 C)     Body mass index is 18.62 kg/(m^2).    ECOG FS: 2  PHYSICAL EXAM: GENERAL  status: Performance status is 2  Patient has not lost significant weight Walking with the help of walker. HEENT: No evidence of stomatitis.  No swelling on the left side of the neck.  Osteonecrosis of jaw bone is stable.  No evidence of infection.   Sclera and conjunctivae :: No jaundice.   pale looking . Lungs: Air  entry equal on both sides.  No rhonchi.  No rales.  Cardiac: Heart sounds are normal.  No pericardial rub.  No murmur. Lymphatic system: Cervical, axillary, inguinal, lymph nodes not palpable GI: Abdomen is soft.  No ascites.  Liver spleen not palpable.  No tenderness.  Bowel sounds are within normal limit Lower extremity:Patient has ulceration which is traumatic but gradually getting worse.  Redness around it  Neurologically, the patient was awake, alert, and oriented to person, place and time. There were no obvious focal neurologic abnormalities.ct No evidence of peripheral neuropathy. Examination of the skin revealed no evidence of significant rashes, suspicious appearing nevi or other concerning lesions.       Diagnosis Skin , right arm INVASIVE SQUAMOUS CELL CARCINOMA (KERATOACANTHOMA TYPE). MARGINS OF RESECTION ARE NEGATIVE. Casimer Lanius MD Pathologist, Electronic Signature (Case signed 01/20/2015)  STUDIES: ctrophoresis, serum  Status: EditedResult-FINAL Visible to patient:  Not Released Nextappt: 03/01/2015 at 11:00 AM in Oncology ([facility]) Dx:  Multiple myeloma in  relapse              Ref Range 1d ago  43moago  247mogo     Total Protein ELP 6.0 - 8.5 g/dL 6.0 6.3 6.3    Albumin ELP 2.9 - 4.4 g/dL 3.4 3.5 3.6    Alpha-1-Globulin 0.0 - 0.4 g/dL 0.2 0.2 0.2    Alpha-2-Globulin 0.4 - 1.0 g/dL 0.7 0.6 0.6    Beta Globulin 0.7 - 1.3 g/dL 1.0 1.0 1.0    Gamma Globulin 0.4 - 1.8 g/dL 0.7 0.9 0.9    M-Spike, % Not Observed g/dL 0.5 (H) 0.6 0.6         Kappa/lambda light chains (Order [id number]     Kappa/lambda light chains  Status: Finalresult Visible to patient:  Not Released Nextappt: 03/01/2015 at 11:00 AM in Oncology ([facility]) Dx:  Multiple myeloma in relapse              Ref Range 1d ago  758moago  225mogo  58m17moo     Kappa free light chain 3.30 - 19.40 mg/L 16.25 16.04 18.31 14.97    Lamda free light chains 5.71 - 26.30 mg/L 9.68 11.21 11.72 11.56    Kappa, lamda light chain ratio 0.26 - 1.65  1.68 (H) 1.43CM 1.56CM 1.29CM         ASSESSMENT: Multiple myeloma in relaps  Stable disease with stable M spike.  On pomolidomide [place] C cup this cycle.  Patient was advised to not to start new cycle until also seen of both lower extremity completely heals 2.  Traumatic nonhealing ulcer in both lower extremity being followed by surgeon left extremity is significant pain.  Pain medication with hydrocodone was given.\ 3.  Osteonecrosis of jaw bone stable 4.  Bilateral traumatic ulcer which were nonhealing.  Patient is taking help of bone care physician and there is significant change in the ulcers left extremity is healing well.  Right extremity is also doing better Will continue   pomolidomide  2 weeks on and 2 weeks off Reevaluation with [facility]IEP Discussed that in detail with the patient.  All lab data has been reviewed. Wound care physicians and nurses note had been evaluated  Reevaluation in 4 weeks or before if needed      No matching staging information was found for the patient.  JanForest GleasonD   04/01/2015 5:39 PM

## 2015-04-04 ENCOUNTER — Encounter: Payer: Medicare Other | Admitting: Surgery

## 2015-04-04 DIAGNOSIS — I2510 Atherosclerotic heart disease of native coronary artery without angina pectoris: Secondary | ICD-10-CM | POA: Diagnosis not present

## 2015-04-04 DIAGNOSIS — S81811A Laceration without foreign body, right lower leg, initial encounter: Secondary | ICD-10-CM | POA: Diagnosis not present

## 2015-04-04 DIAGNOSIS — L97222 Non-pressure chronic ulcer of left calf with fat layer exposed: Secondary | ICD-10-CM | POA: Diagnosis not present

## 2015-04-04 DIAGNOSIS — C9000 Multiple myeloma not having achieved remission: Secondary | ICD-10-CM | POA: Diagnosis not present

## 2015-04-04 DIAGNOSIS — L97212 Non-pressure chronic ulcer of right calf with fat layer exposed: Secondary | ICD-10-CM | POA: Diagnosis not present

## 2015-04-04 DIAGNOSIS — S81812A Laceration without foreign body, left lower leg, initial encounter: Secondary | ICD-10-CM | POA: Diagnosis not present

## 2015-04-04 DIAGNOSIS — S81812D Laceration without foreign body, left lower leg, subsequent encounter: Secondary | ICD-10-CM | POA: Diagnosis not present

## 2015-04-04 DIAGNOSIS — S81811D Laceration without foreign body, right lower leg, subsequent encounter: Secondary | ICD-10-CM | POA: Diagnosis not present

## 2015-04-06 NOTE — Progress Notes (Signed)
Silas, LAURABETH YIP ([id number]) Visit Report for 04/04/2015 Chief Complaint Document Details Patient Name: [name], [name]. 04/04/2015 2:15 Date of Service: PM Medical Record [id number] Number: Patient Account Number: [id number] Nov 08, 1924 (81 y.o. Treating RN: Montey Hora Date of Birth/Sex: Female) Other Clinician: Primary Care Physician: BABAOFF, MARCUS Treating Jeffory Snelgrove Referring Physician: Derinda Late Physician/Extender: Weeks in Treatment: 3 Information Obtained from: Patient Chief Complaint Patient presents to the wound care center for a consult due non healing wound She had a fall and injured both lower extremities 8 weeks ago Electronic Signature(s) Signed: 04/04/2015 2:38:01 PM By: Christin Fudge MD, FACS Entered By: Christin Fudge on 04/04/2015 14:38:01 [name], [name] ([id number]) -------------------------------------------------------------------------------- Debridement Details Patient Name: Penny Boss M. 04/04/2015 2:15 Date of Service: PM Medical Record [id number] Number: Patient Account Number: [id number] 1924-06-26 (81 y.o. Treating RN: Montey Hora Date of Birth/Sex: Female) Other Clinician: Primary Care Physician: BABAOFF, MARCUS Treating Juni Glaab Referring Physician: Derinda Late Physician/Extender: Weeks in Treatment: 3 Debridement Performed for Wound #1 Left,Anterior Lower Leg Assessment: Performed By: Physician Christin Fudge, MD Debridement: Open Wound/Selective Debridement Selective Description: Pre-procedure Yes Verification/Time Out Taken: Start Time: 14:25 Pain Control: Other : lidocaine 4% Level: Non-Viable Tissue Total Area Debrided (L x 0.3 (cm) x 0.2 (cm) = 0.06 (cm) W): Tissue and other Non-Viable, Eschar, Exudate material debrided: Instrument: Forceps Bleeding: None End Time: 14:30 Procedural Pain: 0 Post Procedural Pain: 0 Response to Treatment: Procedure was tolerated well Post Debridement  Measurements of Total Wound Length: (cm) 0.3 Width: (cm) 0.2 Depth: (cm) 0.1 Volume: (cm) 0.005 Post Procedure Diagnosis Same as Pre-procedure Electronic Signature(s) Signed: 04/04/2015 2:37:00 PM By: Christin Fudge MD, FACS Signed: 04/04/2015 5:44:39 PM By: Montey Hora Entered By: Christin Fudge on 04/04/2015 14:37:00 [name], [name] ([id number]) [name], [name] ([id number]) -------------------------------------------------------------------------------- Debridement Details Patient Name: Penny Boss M. 04/04/2015 2:15 Date of Service: PM Medical Record [id number] Number: Patient Account Number: [id number] Mar 07, 1925 (81 y.o. Treating RN: Montey Hora Date of Birth/Sex: Female) Other Clinician: Primary Care Physician: BABAOFF, MARCUS Treating Khrystina Bonnes Referring Physician: Derinda Late Physician/Extender: Weeks in Treatment: 3 Debridement Performed for Wound #2 Right,Anterior Lower Leg Assessment: Performed By: Physician Christin Fudge, MD Debridement: Open Wound/Selective Debridement Selective Description: Pre-procedure Yes Verification/Time Out Taken: Start Time: 14:25 Pain Control: Other : lidocaine 4% Level: Non-Viable Tissue Total Area Debrided (L x 0.3 (cm) x 0.4 (cm) = 0.12 (cm) W): Tissue and other Non-Viable, Eschar, Exudate material debrided: Instrument: Forceps Bleeding: None End Time: 14:30 Procedural Pain: 0 Post Procedural Pain: 0 Response to Treatment: Procedure was tolerated well Post Debridement Measurements of Total Wound Length: (cm) 0.3 Width: (cm) 0.4 Depth: (cm) 0.2 Volume: (cm) 0.019 Post Procedure Diagnosis Same as Pre-procedure Electronic Signature(s) Signed: 04/04/2015 2:37:36 PM By: Christin Fudge MD, FACS Signed: 04/04/2015 5:44:39 PM By: Montey Hora Entered By: Christin Fudge on 04/04/2015 14:37:36 [name], [name] ([id number]) [name], [name]  ([id number]) -------------------------------------------------------------------------------- HPI Details Patient Name: Penny Boss M. 04/04/2015 2:15 Date of Service: PM Medical Record [id number] Number: Patient Account Number: [id number] 1925/04/01 (81 y.o. Treating RN: Montey Hora Date of Birth/Sex: Female) Other Clinician: Primary Care Physician: BABAOFF, MARCUS Treating Avarey Yaeger Referring Physician: BABAOFF, MARCUS Physician/Extender: Weeks in Treatment: 3 History of Present Illness Location: both lower legs Quality: Patient reports experiencing a sharp pain to affected area(s). Severity: Patient states wound are getting worse. Duration: Patient has had the wound for < 8 weeks prior to presenting for treatment Timing: Pain in wound is constant (hurts all the time) Context:  The wound occurred when the patient tripped and fell and had lacerations on both lower extremities Modifying Factors: Other treatment(s) tried include:seen in the ER recently and was put on doxycycline Associated Signs and Symptoms: Patient reports having difficulty standing for long periods. HPI Description: pleasant 81 year old patient who had a lacerated wound to both lower extremities about 8 weeks ago and has been treating it with local antibody, ointment. She was seen by her oncologist recently who referred her to a surgeon for an unassociated problem and also asked her to get some x-rays which did not show any bone involvement. The patient has recently been put on doxycycline and some pain medications. She has multiple myeloma and is been on treatment for this for several years. Electronic Signature(s) Signed: 04/04/2015 2:38:06 PM By: Christin Fudge MD, FACS Entered By: Christin Fudge on 04/04/2015 14:38:06 [name], [name] ([id number]) -------------------------------------------------------------------------------- Physical Exam Details Patient Name: Penny Boss M. 04/04/2015 2:15 Date  of Service: PM Medical Record [id number] Number: Patient Account Number: [id number] April 14, 1925 (81 y.o. Treating RN: Montey Hora Date of Birth/Sex: Female) Other Clinician: Primary Care Physician: BABAOFF, MARCUS Treating Kyson Kupper Referring Physician: BABAOFF, MARCUS Physician/Extender: Weeks in Treatment: 3 Constitutional . Pulse regular. Respirations normal and unlabored. Afebrile. . Eyes Nonicteric. Reactive to light. Ears, Nose, Mouth, and Throat Lips, teeth, and gums WNL.Marland Kitchen Moist mucosa without lesions . Neck supple and nontender. No palpable supraclavicular or cervical adenopathy. Normal sized without goiter. Respiratory WNL. No retractions.. Cardiovascular Pedal Pulses WNL. No clubbing, cyanosis or edema. Chest Breasts symmetical and no nipple discharge.. Breast tissue WNL, no masses, lumps, or tenderness.. Lymphatic No adneopathy. No adenopathy. No adenopathy. Musculoskeletal Adexa without tenderness or enlargement.. Digits and nails w/o clubbing, cyanosis, infection, petechiae, ischemia, or inflammatory conditions.. Integumentary (Hair, Skin) No suspicious lesions. No crepitus or fluctuance. No peri-wound warmth or erythema. No masses.Marland Kitchen Psychiatric Judgement and insight Intact.. No evidence of depression, anxiety, or agitation.. Notes once the eschar was removed the wound is looking very clean and is much smaller. Electronic Signature(s) Signed: 04/04/2015 2:38:33 PM By: Christin Fudge MD, FACS Entered By: Christin Fudge on 04/04/2015 14:38:33 [name], [name] ([id number]) -------------------------------------------------------------------------------- Physician Orders Details Patient Name: Penny Boss M. 04/04/2015 2:15 Date of Service: PM Medical Record [id number] Number: Patient Account Number: [id number] 30-Mar-1925 (81 y.o. Treating RN: Cornell Barman Date of Birth/Sex: Female) Other Clinician: Primary Care Physician: BABAOFF, MARCUS Treating Christin Fudge Referring Physician: Derinda Late Physician/Extender: Suella [place] in Treatment: 3 Verbal / Phone Orders: Yes Clinician: Cornell Barman Read Back and Verified: Yes Diagnosis Coding Wound Cleansing Wound #1 Left,Anterior Lower Leg o Cleanse wound with mild soap and water o May Shower, gently pat wound dry prior to applying new dressing. Wound #2 Right,Anterior Lower Leg o Cleanse wound with mild soap and water o May Shower, gently pat wound dry prior to applying new dressing. Anesthetic Wound #1 Left,Anterior Lower Leg o Topical Lidocaine 4% cream applied to wound bed prior to debridement Wound #2 Right,Anterior Lower Leg o Topical Lidocaine 4% cream applied to wound bed prior to debridement Primary Wound Dressing Wound #1 Left,Anterior Lower Leg o Prisma Ag Wound #2 Right,Anterior Lower Leg o Prisma Ag Secondary Dressing Wound #1 Left,Anterior Lower Leg o Gauze and Kerlix/Conform Wound #2 Right,Anterior Lower Leg o Gauze and Kerlix/Conform Dressing Change Frequency Wound #1 Left,Anterior Lower Leg o Change dressing every other day. [name], [name] ([id number]) Wound #2 Right,Anterior Lower Leg o Change dressing every other day. Follow-up Appointments Wound #1 Left,Anterior Lower Leg o  Return Appointment in 1 week. Wound #2 Right,Anterior Lower Leg o Return Appointment in 1 week. Electronic Signature(s) Signed: 04/04/2015 4:26:55 PM By: Christin Fudge MD, FACS Signed: 04/05/2015 5:15:26 PM By: Gretta Cool RN, BSN, Kim RN, BSN Entered By: Gretta Cool, RN, BSN, Kim on 04/04/2015 14:34:20 [name], [name] ([id number]) -------------------------------------------------------------------------------- Problem List Details Patient Name: MARCHE, HOTTENSTEIN. 04/04/2015 2:15 Date of Service: PM Medical Record [id number] Number: Patient Account Number: [id number] Apr 27, 1925 (81 y.o. 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 04/04/2015 2:15 Date of Service: PM Medical Record [id number] Number: Patient Account Number: [id number] Nov 24, 1924 (81 y.o. Treating RN: Montey Hora Date of Birth/Sex: Female) Other Clinician: Primary Care Physician: BABAOFF, MARCUS Treating Jafari Mckillop Referring Physician: BABAOFF, MARCUS Physician/Extender: Weeks in Treatment: 3 Subjective Chief Complaint Information obtained from Patient Patient presents to the wound care center for a consult due non healing wound She had a fall and injured both lower extremities 8 weeks ago History of Present Illness (HPI) The following HPI elements were documented for the patient's wound: Location: both lower legs Quality: Patient reports experiencing a sharp pain to affected area(s). Severity: Patient states wound are getting worse. Duration: Patient has had the wound for < 8 weeks prior to presenting for treatment Timing: Pain in wound is constant (hurts all the time) Context: The  wound occurred when the patient tripped and fell and had lacerations on both lower extremities Modifying Factors: Other treatment(s) tried include:seen in the ER recently and was put on doxycycline Associated Signs and Symptoms: Patient reports having difficulty standing for long periods. pleasant 81 year old patient who had a lacerated wound to both lower extremities about 8 weeks ago and has been treating it with local antibody, ointment. She was seen by her oncologist recently who referred her to a surgeon for an unassociated problem and also asked her to get some x-rays which did not show any bone involvement. The patient has recently been put on doxycycline and some pain medications. She has multiple myeloma and is been on treatment for this for several years. Objective Constitutional Pulse regular. Respirations normal and unlabored. Afebrile. Vitals Time Taken: 2:20 PM, Height: 62 in, Weight: 103 lbs, BMI: 18.8, Temperature: 97.9 F, Pulse: 93 bpm, Respiratory Rate: 18 breaths/min, Blood Pressure: 130/60 mmHg. [name], [name] ([id number]) Eyes Nonicteric. Reactive to light. Ears, Nose, Mouth, and Throat Lips, teeth, and gums WNL.Marland Kitchen Moist mucosa without lesions . Neck supple and nontender. No palpable supraclavicular or cervical adenopathy. Normal sized without goiter. Respiratory WNL. No retractions.. Cardiovascular Pedal Pulses WNL. No clubbing, cyanosis or edema. Chest Breasts symmetical and no nipple discharge.. Breast tissue WNL, no masses, lumps, or tenderness.. Lymphatic No adneopathy. No adenopathy. No adenopathy. Musculoskeletal Adexa without tenderness or enlargement.. Digits and nails w/o clubbing, cyanosis, infection, petechiae, ischemia, or inflammatory  conditions.Marland Kitchen Psychiatric Judgement and insight Intact.. No evidence of depression, anxiety, or agitation.. General Notes: once the eschar was removed the wound is looking very clean and is much  smaller. Integumentary (Hair, Skin) No suspicious lesions. No crepitus or fluctuance. No peri-wound warmth or erythema. No masses.. Wound #1 status is Open. Original cause of wound was Trauma. The wound is located on the Left,Anterior Lower Leg. The wound measures 0.3cm length x 0.2cm width x 0.1cm depth; 0.047cm^2 area and 0.005cm^3 volume. Wound #2 status is Open. Original cause of wound was Trauma. The wound is located on the Right,Anterior Lower Leg. The wound measures 0.3cm length x 0.4cm width x 0.2cm depth; 0.094cm^2 area and 0.019cm^3 volume. Assessment Kydd, Sheron M. 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 She will come back to see me next week. Procedures Wound #1 Wound #1 is a Trauma, Other located on the Left,Anterior Lower Leg . There was a Non-Viable Tissue Open Wound/Selective [phone]) debridement with total area of 0.06 sq cm performed by Christin Fudge, MD. with the following instrument(s): Forceps to remove Non-Viable tissue/material including Exudate and Eschar after achieving pain control using Other (lidocaine 4%). A time out was conducted prior to the start of the procedure. There was no bleeding. The procedure was tolerated well with a pain level of 0 throughout and a pain level of 0 following the procedure. Post Debridement Measurements: 0.3cm length x 0.2cm width x 0.1cm depth; 0.005cm^3 volume. Post procedure Diagnosis Wound #1: Same as Pre-Procedure Wound #2 Wound #2 is a Trauma, Other located on  the Right,Anterior Lower Leg . There was a Non-Viable Tissue Open Wound/Selective [phone]) debridement with total area of 0.12 sq cm performed by Christin Fudge, MD. with the following instrument(s): Forceps to remove Non-Viable tissue/material including Exudate and Eschar after achieving pain control using Other (lidocaine 4%). A time out was conducted prior to the start of the procedure. There was no bleeding. The procedure was tolerated well with a pain level of 0 throughout and a pain level of 0 following the procedure. Post Debridement Measurements: 0.3cm length x 0.4cm width x 0.2cm depth; 0.019cm^3 volume. Post procedure Diagnosis Wound #2: Same as Pre-Procedure Plan Goeden, Seren M. ([id number]) Wound Cleansing: Wound #1 Left,Anterior Lower Leg: Cleanse wound with mild soap and water May Shower, gently pat wound dry prior to applying new dressing. Wound #2 Right,Anterior Lower Leg: Cleanse wound with mild soap and water May Shower, gently pat wound dry prior to applying new dressing. Anesthetic: Wound #1 Left,Anterior Lower Leg: Topical Lidocaine 4% cream applied to wound bed prior to debridement Wound #2 Right,Anterior Lower Leg: Topical Lidocaine 4% cream applied to wound bed prior to debridement Primary Wound Dressing: Wound #1 Left,Anterior Lower Leg: Prisma Ag Wound #2 Right,Anterior Lower Leg: Prisma Ag Secondary Dressing: Wound #1 Left,Anterior Lower Leg: Gauze and Kerlix/Conform Wound #2 Right,Anterior Lower Leg: Gauze and Kerlix/Conform Dressing Change Frequency: Wound #1 Left,Anterior Lower Leg: Change dressing every other day. Wound #2 Right,Anterior Lower Leg: Change dressing every other day. Follow-up Appointments: Wound #1 Left,Anterior Lower Leg: Return Appointment in 1 week. Wound #2  Right,Anterior Lower Leg: Return Appointment in 1 week. I have recommended Prisma Hg over the wounds to be changed on alternate days with a bordered  foam dressing. She will come back to see me next week. Electronic Signature(s) Signed: 04/04/2015 2:39:14 PM By: Christin Fudge MD, FACS Entered By: Christin Fudge on 04/04/2015 14:39:14 Strawser, [name] ([id number]) Baskett, [name] ([id number]) -------------------------------------------------------------------------------- SuperBill Details Patient Name: Espey, Breeona M. Date of Service: 04/04/2015 Medical Record Number: [id number] Patient Account Number: [id number] Date of Birth/Sex: 01/06/1925 (81 y.o. 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 Facility Procedures CPT4 Code Description: [id number] - DEBRIDE WOUND 1ST 20 SQ CM OR < ICD-10 Description Diagnosis S81.811A Laceration without foreign body, right lower leg, init S81.812A Laceration without foreign body, left lower leg, initi L97.212 Non-pressure  chronic ulcer of right calf with fat laye L97.222 Non-pressure chronic ulcer of left calf with fat layer Modifier: ial encounte al encounter r exposed exposed Quantity: 1 r Physician Procedures CPT4 Code Description: [id number] - WC PHYS DEBR WO ANESTH 20 SQ CM ICD-10 Description Diagnosis S81.811A Laceration without foreign body, right lower leg, init S81.812A Laceration without foreign body, left lower leg, initi L97.212 Non-pressure  chronic ulcer of right calf with fat laye L97.222 Non-pressure chronic ulcer of left calf with  fat layer Modifier: ial encounte al encounter r exposed exposed Quantity: 1 r Electronic Signature(s) Signed: 04/04/2015 2:39:42 PM By: Christin Fudge MD, FACS Entered By: Christin Fudge on 04/04/2015 14:39:41

## 2015-04-06 NOTE — Progress Notes (Signed)
[name] ([id number]) Visit Report for 04/04/2015 Arrival Information Details Patient Name: [name], [name]. Date of Service: 04/04/2015 2:15 PM Medical Record Number: [id number] Patient Account Number: [id number] Date of Birth/Sex: 1925-03-01 (81 y.o. Female) Treating RN: Cornell Barman Primary Care Physician: Derinda Late Other Clinician: Referring Physician: BABAOFF, MARCUS Treating Physician/Extender: Frann Rider in Treatment: 3 Visit Information History Since Last Visit Added or deleted any medications: No Patient Arrived: Ambulatory Any new allergies or adverse reactions: No Arrival Time: 14:19 Had a fall or experienced change in No Accompanied By: friend activities of daily living that may affect Transfer Assistance: None risk of falls: Patient Identification Verified: Yes Signs or symptoms of abuse/neglect since last No Secondary Verification Process Yes visito Completed: Hospitalized since last visit: No Patient Requires Transmission-Based No Has Dressing in Place as Prescribed: Yes Precautions: Pain Present Now: No Patient Has Alerts: No Electronic Signature(s) Signed: 04/05/2015 5:15:26 PM By: Gretta Cool, RN, BSN, Kim RN, BSN Entered By: Gretta Cool, RN, BSN, Kim on 04/04/2015 14:21:22 Valdez, [name] ([id number]) -------------------------------------------------------------------------------- Encounter Discharge Information Details Patient Name: [name], [name] Date of Service: 04/04/2015 2:15 PM Medical Record Number: [id number] Patient Account Number: [id number] Date of Birth/Sex: Aug 14, 1924 (81 y.o. Female) Treating RN: Montey Hora Primary Care Physician: BABAOFF, MARCUS Other Clinician: Referring Physician: BABAOFF, MARCUS Treating Physician/Extender: Frann Rider in Treatment: 3 Encounter Discharge Information Items Discharge Pain Level: 0 Discharge Condition: Stable Ambulatory Status: Cane Discharge Destination:  Home Transportation: Private Auto Accompanied By: friend Schedule Follow-up Appointment: Yes Medication Reconciliation completed and provided to Patient/Care Yes Ayven Pheasant: Provided on Clinical Summary of Care: 04/04/2015 Form Type Recipient Paper Patient MM Electronic Signature(s) Signed: 04/05/2015 5:15:26 PM By: Gretta Cool RN, BSN, Kim RN, BSN Previous Signature: 04/04/2015 2:41:29 PM Version By: Ruthine Dose Entered By: Gretta Cool RN, BSN, Kim on 04/04/2015 14:51:31 Gaskins, [name] ([id number]) -------------------------------------------------------------------------------- Lower Extremity Assessment Details Patient Name: Crapps, Sanyla M. Date of Service: 04/04/2015 2:15 PM Medical Record Number: [id number] Patient Account Number: [id number] Date of Birth/Sex: 04-Aug-1924 (81 y.o. Female) Treating RN: Cornell Barman Primary Care Physician: Derinda Late Other Clinician: Referring Physician: BABAOFF, MARCUS Treating Physician/Extender: Frann Rider in Treatment: 3 Vascular Assessment Pulses: Posterior Tibial Dorsalis Pedis Palpable: [Left:Yes] [Right:Yes] Extremity colors, hair growth, and conditions: Extremity Color: [Left:Normal] [Right:Normal] Hair Growth on Extremity: [Left:No] [Right:No] Temperature of Extremity: [Left:Cool] [Right:Cool] Capillary Refill: [Left:< 3 seconds] [Right:< 3 seconds] Toe Nail Assessment Left: Right: Thick: No No Discolored: No No Deformed: No No Improper Length and Hygiene: No No Electronic Signature(s) Signed: 04/05/2015 5:15:26 PM By: Gretta Cool, RN, BSN, Kim RN, BSN Entered By: Gretta Cool, RN, BSN, Kim on 04/04/2015 14:22:48 Merkel, Valery M. ([id number]) -------------------------------------------------------------------------------- Multi Wound Chart Details Patient Name: [name], Penny Coder M. Date of Service: 04/04/2015 2:15 PM Medical Record Number: [id number] Patient Account Number: [id number] Date of Birth/Sex: 12-31-1924 (81 y.o.  Female) Treating RN: Cornell Barman Primary Care Physician: Derinda Late Other Clinician: Referring Physician: BABAOFF, MARCUS Treating Physician/Extender: Frann Rider in Treatment: 3 Vital Signs Height(in): 62 Pulse(bpm): 93 Weight(lbs): 103 Blood Pressure 130/60 (mmHg): Body Mass Index(BMI): 19 Temperature(F): 97.9 Respiratory Rate 18 (breaths/min): Photos: [1:No Photos] [2:No Photos] [N/A:N/A] Wound Location: [1:Left, Anterior Lower Leg] [2:Right, Anterior Lower Leg] [N/A:N/A] Wounding Event: [1:Trauma] [2:Trauma] [N/A:N/A] Primary Etiology: [1:Trauma, Other] [2:Trauma, Other] [N/A:N/A] Date Acquired: [1:02/07/2015] [2:02/07/2015] [N/A:N/A] Weeks of Treatment: [1:3] [2:3] [N/A:N/A] Wound Status: [1:Open] [2:Open] [N/A:N/A] Measurements L x W x D 0.3x0.2x0.1 [2:0.3x0.4x0.2] [N/A:N/A] (cm) Area (cm) : [1:0.047] [2:0.094] [N/A:N/A] Volume (cm) : [1:0.005] [2:0.019] [N/A:N/A] %  Reduction in Area: [1:95.80%] [2:90.80%] [N/A:N/A] % Reduction in Volume: 95.60% [2:81.40%] [N/A:N/A] Classification: [1:Full Thickness Without Exposed Support Structures] [2:Full Thickness Without Exposed Support Structures] [N/A:N/A] Periwound Skin Texture: No Abnormalities Noted [2:No Abnormalities Noted] [N/A:N/A] Periwound Skin [1:No Abnormalities Noted] [2:No Abnormalities Noted] [N/A:N/A] Moisture: Periwound Skin Color: No Abnormalities Noted [2:No Abnormalities Noted] [N/A:N/A] Tenderness on [1:No] [2:No] [N/A:N/A] Treatment Notes Electronic Signature(s) Signed: 04/05/2015 5:15:26 PM By: Gretta Cool, RN, BSN, Kim RN, BSN Entered By: Gretta Cool, RN, BSN, Kim on 04/04/2015 14:26:25 Halling, Kery M. ([id number]) Swaney, [name] ([id number]) -------------------------------------------------------------------------------- [place] Details Patient Name: [name], [name] Date of Service: 04/04/2015 2:15 PM Medical Record Number: [id number] Patient Account Number:  [id number] Date of Birth/Sex: 1924/08/26 (81 y.o. Female) Treating RN: Cornell Barman Primary Care Physician: Derinda Late Other Clinician: Referring Physician: BABAOFF, MARCUS Treating Physician/Extender: Frann Rider in Treatment: 3 Active Inactive Abuse / Safety / Falls / Self Care Management Nursing Diagnoses: Impaired physical mobility Potential for falls Self care deficit: actual or potential Goals: Patient/caregiver will verbalize understanding of skin care regimen Date Initiated: 03/10/2015 Goal Status: Active Patient/caregiver will verbalize/demonstrate measure taken to improve self care Date Initiated: 03/10/2015 Goal Status: Active Patient/caregiver will verbalize/demonstrate measures taken to improve the patient's personal safety Date Initiated: 03/10/2015 Goal Status: Active Patient/caregiver will verbalize/demonstrate measures taken to prevent injury and/or falls Date Initiated: 03/10/2015 Goal Status: Active Patient/caregiver will verbalize/demonstrate understanding of what to do in case of emergency Date Initiated: 03/10/2015 Goal Status: Active Interventions: Assess fall risk on admission and as needed Assess self care needs on admission and as needed Provide education on basic hygiene Provide education on fall prevention Provide education on personal and home safety Provide education on safe transfers Treatment Activities: Education provided on Basic Hygiene : 03/10/2015 Herren, GWYNEVERE LIZANA ([id number]) Notes: Orientation to the Wound Care Program Nursing Diagnoses: Knowledge deficit related to the wound healing center program Goals: Patient/caregiver will verbalize understanding of the [facility] Date Initiated: 03/10/2015 Goal Status: Active Interventions: Provide education on orientation to the wound center Notes: Wound/Skin Impairment Nursing Diagnoses: Impaired tissue integrity Knowledge deficit related to ulceration/compromised  skin integrity Goals: Patient/caregiver will verbalize understanding of skin care regimen Date Initiated: 03/10/2015 Goal Status: Active Ulcer/skin breakdown will have a volume reduction of 30% by week 4 Date Initiated: 03/10/2015 Goal Status: Active Ulcer/skin breakdown will have a volume reduction of 50% by week 8 Date Initiated: 03/10/2015 Goal Status: Active Ulcer/skin breakdown will have a volume reduction of 80% by week 12 Date Initiated: 03/10/2015 Goal Status: Active Ulcer/skin breakdown will heal within 14 weeks Date Initiated: 03/10/2015 Goal Status: Active Interventions: Assess patient/caregiver ability to obtain necessary supplies Assess ulceration(s) every visit Provide education on ulcer and skin care Treatment Activities: MONEA, PESANTEZ ([id number]) Referred to DME Kaedynce Tapp for dressing supplies : 04/04/2015 Skin care regimen initiated : 04/04/2015 Topical wound management initiated : 04/04/2015 Notes: Electronic Signature(s) Signed: 04/05/2015 5:15:26 PM By: Gretta Cool, RN, BSN, Kim RN, BSN Entered By: Gretta Cool, RN, BSN, Kim on 04/04/2015 14:26:20 Bickley, Dewanna M. ([id number]) -------------------------------------------------------------------------------- Pain Assessment Details Patient Name: Urich, Denielle M. Date of Service: 04/04/2015 2:15 PM Medical Record Number: [id number] Patient Account Number: [id number] Date of Birth/Sex: March 09, 1925 (81 y.o. Female) Treating RN: Cornell Barman Primary Care Physician: Derinda Late Other Clinician: Referring Physician: Derinda Late Treating Physician/Extender: Frann Rider in Treatment: 3 Active Problems Location of Pain Severity and Description of Pain Patient Has Paino No Site Locations Pain Management and Medication Current Pain Management: Electronic  Signature(s) Signed: 04/05/2015 5:15:26 PM By: Gretta Cool, RN, BSN, Kim RN, BSN Entered By: Gretta Cool, RN, BSN, Kim on 04/04/2015 14:21:29 Beers, [name]  ([id number]) -------------------------------------------------------------------------------- Patient/Caregiver Education Details Patient Name: Bondarenko, Penny Coder M. Date of Service: 04/04/2015 2:15 PM Medical Record Number: [id number] Patient Account Number: [id number] Date of Birth/Gender: July 03, 1924 (81 y.o. Female) Treating RN: Cornell Barman Primary Care Physician: Derinda Late Other Clinician: Referring Physician: Derinda Late Treating Physician/Extender: Frann Rider in Treatment: 3 Education Assessment Education Provided To: Patient Education Topics Provided Wound/Skin Impairment: Handouts: Caring for Your Ulcer, Other: continue wound care as prescribed Methods: Demonstration Responses: State content correctly Electronic Signature(s) Signed: 04/05/2015 5:15:26 PM By: Gretta Cool, RN, BSN, Kim RN, BSN Entered By: Gretta Cool, RN, BSN, Kim on 04/04/2015 14:51:52 Wirtanen, [name] ([id number]) -------------------------------------------------------------------------------- Wound Assessment Details Patient Name: Oldfield, Nathali M. Date of Service: 04/04/2015 2:15 PM Medical Record Number: [id number] Patient Account Number: [id number] Date of Birth/Sex: [birth year] (81 y.o. Female) Treating RN: Cornell Barman Primary Care Physician: Derinda Late Other Clinician: Referring Physician: BABAOFF, MARCUS Treating Physician/Extender: Frann Rider in Treatment: 3 Wound Status Wound Number: 1 Primary Etiology: Trauma, Other Wound Location: Left, Anterior Lower Leg Wound Status: Open Wounding Event: Trauma Date Acquired: 02/07/2015 Weeks Of Treatment: 3 Clustered Wound: No Photos Photo Uploaded By: Gretta Cool, RN, BSN, Kim on 04/04/2015 17:04:53 Wound Measurements Length: (cm) 0.3 Width: (cm) 0.2 Depth: (cm) 0.1 Area: (cm) 0.047 Volume: (cm) 0.005 % Reduction in Area: 95.8% % Reduction in Volume: 95.6% Wound Description Full Thickness Without Exposed Classification: Support  Structures Periwound Skin Texture Texture Color No Abnormalities Noted: No No Abnormalities Noted: No Moisture No Abnormalities Noted: No Treatment Notes Wound #1 (Left, Anterior Lower Leg) Limbach, Yuliet M. ([id number]) 1. Cleansed with: Clean wound with Normal Saline 2. Anesthetic Topical Lidocaine 4% cream to wound bed prior to debridement 4. Dressing Applied: Prisma Ag 5. Secondary Dressing Applied Bordered Foam Dressing Electronic Signature(s) Signed: 04/05/2015 5:15:26 PM By: Gretta Cool, RN, BSN, Kim RN, BSN Entered By: Gretta Cool, RN, BSN, Kim on 04/04/2015 14:24:45 Holle, [name] ([id number]) -------------------------------------------------------------------------------- Wound Assessment Details Patient Name: Nipper, Anihya M. Date of Service: 04/04/2015 2:15 PM Medical Record Number: [id number] Patient Account Number: [id number] Date of Birth/Sex: Oct 20, 1924 (81 y.o. Female) Treating RN: Cornell Barman Primary Care Physician: Derinda Late Other Clinician: Referring Physician: BABAOFF, MARCUS Treating Physician/Extender: Frann Rider in Treatment: 3 Wound Status Wound Number: 2 Primary Etiology: Trauma, Other Wound Location: Right, Anterior Lower Leg Wound Status: Open Wounding Event: Trauma Date Acquired: 02/07/2015 Weeks Of Treatment: 3 Clustered Wound: No Photos Photo Uploaded By: Gretta Cool, RN, BSN, Kim on 04/04/2015 17:09:39 Wound Measurements Length: (cm) 0.3 Width: (cm) 0.4 Depth: (cm) 0.2 Area: (cm) 0.094 Volume: (cm) 0.019 % Reduction in Area: 90.8% % Reduction in Volume: 81.4% Wound Description Full Thickness Without Exposed Classification: Support Structures Periwound Skin Texture Texture Color No Abnormalities Noted: No No Abnormalities Noted: No Moisture No Abnormalities Noted: No Treatment Notes Wound #2 (Right, Anterior Lower Leg) Midgley, Koreena M. ([id number]) 1. Cleansed with: Clean wound with Normal Saline 2. Anesthetic Topical  Lidocaine 4% cream to wound bed prior to debridement 4. Dressing Applied: Prisma Ag 5. Secondary Dressing Applied Bordered Foam Dressing Electronic Signature(s) Signed: 04/05/2015 5:15:26 PM By: Gretta Cool, RN, BSN, Kim RN, BSN Entered By: Gretta Cool, RN, BSN, Kim on 04/04/2015 14:24:45 Earnhart, [name] ([id number]) -------------------------------------------------------------------------------- Vitals Details Patient Name: Daisey, Penny Coder M. Date of Service: 04/04/2015 2:15 PM Medical Record Number: [id number] Patient Account Number: [id number] Date of Birth/Sex: [birth year] (81  y.o. Female) Treating RN: Cornell Barman Primary Care Physician: Derinda Late Other Clinician: Referring Physician: BABAOFF, MARCUS Treating Physician/Extender: Frann Rider in Treatment: 3 Vital Signs Time Taken: 14:20 Temperature (F): 97.9 Height (in): 62 Pulse (bpm): 93 Weight (lbs): 103 Respiratory Rate (breaths/min): 18 Body Mass Index (BMI): 18.8 Blood Pressure (mmHg): 130/60 Reference Range: 80 - 120 mg / dl Electronic Signature(s) Signed: 04/05/2015 5:15:26 PM By: Gretta Cool, RN, BSN, Kim RN, BSN Entered By: Gretta Cool, RN, BSN, Kim on 04/04/2015 14:22:13

## 2015-04-11 ENCOUNTER — Encounter: Payer: Medicare Other | Attending: Surgery | Admitting: Surgery

## 2015-04-11 DIAGNOSIS — M1990 Unspecified osteoarthritis, unspecified site: Secondary | ICD-10-CM | POA: Insufficient documentation

## 2015-04-11 DIAGNOSIS — L97222 Non-pressure chronic ulcer of left calf with fat layer exposed: Secondary | ICD-10-CM | POA: Insufficient documentation

## 2015-04-11 DIAGNOSIS — I10 Essential (primary) hypertension: Secondary | ICD-10-CM | POA: Diagnosis not present

## 2015-04-11 DIAGNOSIS — S81811D Laceration without foreign body, right lower leg, subsequent encounter: Secondary | ICD-10-CM | POA: Insufficient documentation

## 2015-04-11 DIAGNOSIS — I2510 Atherosclerotic heart disease of native coronary artery without angina pectoris: Secondary | ICD-10-CM | POA: Insufficient documentation

## 2015-04-11 DIAGNOSIS — X58XXXD Exposure to other specified factors, subsequent encounter: Secondary | ICD-10-CM | POA: Insufficient documentation

## 2015-04-11 DIAGNOSIS — S81812A Laceration without foreign body, left lower leg, initial encounter: Secondary | ICD-10-CM | POA: Diagnosis not present

## 2015-04-11 DIAGNOSIS — I4891 Unspecified atrial fibrillation: Secondary | ICD-10-CM | POA: Insufficient documentation

## 2015-04-11 DIAGNOSIS — S81812D Laceration without foreign body, left lower leg, subsequent encounter: Secondary | ICD-10-CM | POA: Insufficient documentation

## 2015-04-11 DIAGNOSIS — L97212 Non-pressure chronic ulcer of right calf with fat layer exposed: Secondary | ICD-10-CM | POA: Insufficient documentation

## 2015-04-11 DIAGNOSIS — C9000 Multiple myeloma not having achieved remission: Secondary | ICD-10-CM | POA: Insufficient documentation

## 2015-04-11 DIAGNOSIS — S81811A Laceration without foreign body, right lower leg, initial encounter: Secondary | ICD-10-CM | POA: Diagnosis not present

## 2015-04-11 NOTE — Progress Notes (Signed)
Plaugher, ZEVA LEBER ([id number]) Visit Report for 04/11/2015 Chief Complaint Document Details Patient Name: [name], [name] M. Date of Service: 04/11/2015 2:15 PM Medical Record Number: [id number] Patient Account Number: [id number] Date of Birth/Sex: [birth year] (81 y.o. Female) Treating RN: Cornell Barman Primary Care Physician: Derinda Late Other Clinician: Referring Physician: Derinda Late Treating Physician/Extender: Frann Rider in Treatment: 4 Information Obtained from: Patient Chief Complaint Patient presents to the wound care center for a consult due non healing wound She had a fall and injured both lower extremities 8 weeks ago Electronic Signature(s) Signed: 04/11/2015 2:40:16 PM By: Christin Fudge MD, FACS Entered By: Christin Fudge on 04/11/2015 14:40:16 Rowlands, [name] ([id number]) -------------------------------------------------------------------------------- HPI Details Patient Name: Bergin, Laylah M. Date of Service: 04/11/2015 2:15 PM Medical Record Number: [id number] Patient Account Number: [id number] Date of Birth/Sex: 01/05/1925 (81 y.o. Female) Treating RN: Cornell Barman Primary Care Physician: Derinda Late Other Clinician: Referring Physician: BABAOFF, MARCUS Treating Physician/Extender: Frann Rider in Treatment: 4 History of Present Illness Location: both lower legs Quality: Patient reports experiencing a sharp pain to affected area(s). Severity: Patient states wound are getting worse. Duration: Patient has had the wound for < 8 weeks prior to presenting for treatment Timing: Pain in wound is constant (hurts all the time) Context: The wound occurred when the patient tripped and fell and had lacerations on both lower extremities Modifying Factors: Other treatment(s) tried include:seen in the ER recently and was put on doxycycline Associated Signs and Symptoms: Patient reports having difficulty standing for long periods. HPI Description: pleasant  81 year old patient who had a lacerated wound to both lower extremities about 8 weeks ago and has been treating it with local antibody, ointment. She was seen by her oncologist recently who referred her to a surgeon for an unassociated problem and also asked her to get some x-rays which did not show any bone involvement. The patient has recently been put on doxycycline and some pain medications. She has multiple myeloma and is been on treatment for this for several years. Electronic Signature(s) Signed: 04/11/2015 2:40:22 PM By: Christin Fudge MD, FACS Entered By: Christin Fudge on 04/11/2015 14:40:22 Vastine, [name] ([id number]) -------------------------------------------------------------------------------- Physical Exam Details Patient Name: [name], [name] Date of Service: 04/11/2015 2:15 PM Medical Record Number: [id number] Patient Account Number: [id number] Date of Birth/Sex: [birth year] (81 y.o. Female) Treating RN: Cornell Barman Primary Care Physician: Derinda Late Other Clinician: Referring Physician: BABAOFF, MARCUS Treating Physician/Extender: Frann Rider in Treatment: 4 Constitutional . Pulse regular. Respirations normal and unlabored. Afebrile. . Eyes Nonicteric. Reactive to light. Ears, Nose, Mouth, and Throat Lips, teeth, and gums WNL.Marland Kitchen Moist mucosa without lesions . Neck supple and nontender. No palpable supraclavicular or cervical adenopathy. Normal sized without goiter. Respiratory WNL. No retractions.. Cardiovascular Pedal Pulses WNL. No clubbing, cyanosis or edema. Lymphatic No adneopathy. No adenopathy. No adenopathy. Musculoskeletal Adexa without tenderness or enlargement.. Digits and nails w/o clubbing, cyanosis, infection, petechiae, ischemia, or inflammatory conditions.. Integumentary (Hair, Skin) No suspicious lesions. No crepitus or fluctuance. No peri-wound warmth or erythema. No masses.Marland Kitchen Psychiatric Judgement and insight Intact.. No evidence  of depression, anxiety, or agitation.. Notes her left leg is completely healed. The right leg is a tiny area open and epithelializing well. Electronic Signature(s) Signed: 04/11/2015 2:41:38 PM By: Christin Fudge MD, FACS Entered By: Christin Fudge on 04/11/2015 14:41:37 Louks, [name] ([id number]) -------------------------------------------------------------------------------- Physician Orders Details Patient Name: Carton, Debe Coder M. Date of Service: 04/11/2015 2:15 PM Medical Record Number: [id number] Patient Account Number: [id number] Date of Birth/Sex:  1925/01/07 (81 y.o. Female) Treating RN: Cornell Barman Primary Care Physician: Derinda Late Other Clinician: Referring Physician: BABAOFF, MARCUS Treating Physician/Extender: Frann Rider in Treatment: 4 Verbal / Phone Orders: Yes Clinician: Cornell Barman Read Back and Verified: Yes Diagnosis Coding Wound Cleansing Wound #1 Left,Anterior Lower Leg o Clean wound with Normal Saline. Primary Wound Dressing Wound #1 Left,Anterior Lower Leg o Other: - Mepitel Secondary Dressing Wound #1 Left,Anterior Lower Leg o Boardered Foam Dressing Dressing Change Frequency Wound #1 Left,Anterior Lower Leg o Change dressing every week - Do not change dressing unless it gets wet. Follow-up Appointments Wound #1 Left,Anterior Lower Leg o Return Appointment in 1 week. Electronic Signature(s) Signed: 04/11/2015 4:23:10 PM By: Gretta Cool RN, BSN, Kim RN, BSN Signed: 04/11/2015 4:24:37 PM By: Christin Fudge MD, FACS Entered By: Gretta Cool RN, BSN, Kim on 04/11/2015 14:37:58 Redondo, [name] ([id number]) -------------------------------------------------------------------------------- Problem List Details Patient Name: Jarvis, Takeesha M. Date of Service: 04/11/2015 2:15 PM Medical Record Number: [id number] Patient Account Number: [id number] Date of Birth/Sex: 03/10/1925 (81 y.o. 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 Date of Service: 04/11/2015 2:15 PM Medical Record Number: [id number] Patient Account Number: [id number] Date of Birth/Sex: 1924-11-02 (81 y.o. Female) Treating RN: Cornell Barman Primary Care Physician: Derinda Late Other Clinician: Referring Physician: BABAOFF, MARCUS Treating Physician/Extender: Frann Rider in Treatment: 4 Subjective Chief Complaint Information obtained from Patient Patient presents to the wound care center for a consult due non healing wound She had a fall and injured both lower extremities 8 weeks ago History of Present Illness (HPI) The following HPI elements were documented for the patient's wound: Location: both lower legs Quality: Patient reports experiencing a sharp pain to affected area(s). Severity: Patient states wound are getting worse. Duration: Patient has had the wound for < 8 weeks prior to presenting for treatment Timing: Pain in wound is constant (hurts all the time) Context: The wound occurred  when the patient tripped and fell and had lacerations on both lower extremities Modifying Factors: Other treatment(s) tried include:seen in the ER recently and was put on doxycycline Associated Signs and Symptoms: Patient reports having difficulty standing for long periods. pleasant 81 year old patient who had a lacerated wound to both lower extremities about 8 weeks ago and has been treating it with local antibody, ointment. She was seen by her oncologist recently who referred her to a surgeon for an unassociated problem and also asked her to get some x-rays which did not show any bone involvement. The patient has recently been put on doxycycline and some pain medications. She has multiple myeloma and is been on treatment for this for several years. Objective Constitutional Pulse regular. Respirations normal and unlabored. Afebrile. Vitals Time Taken: 2:21 PM, Height: 62 in, Weight: 103 lbs, BMI: 18.8, Temperature: 98.1 F, Pulse: 71 bpm, Respiratory  Rate: 18 breaths/min, Blood Pressure: 148/64 mmHg. Forstrom, Lamis M. ([id number]) Eyes Nonicteric. Reactive to light. Ears, Nose, Mouth, and Throat Lips, teeth, and gums WNL.Marland Kitchen Moist mucosa without lesions . Neck supple and nontender. No palpable supraclavicular or cervical adenopathy. Normal sized without goiter. Respiratory WNL. No retractions.. Cardiovascular Pedal Pulses WNL. No clubbing, cyanosis or edema. Lymphatic No adneopathy. No adenopathy. No adenopathy. Musculoskeletal Adexa without tenderness or enlargement.. Digits and nails w/o clubbing, cyanosis, infection, petechiae, ischemia, or inflammatory conditions.Marland Kitchen Psychiatric Judgement and insight Intact.. No evidence of depression, anxiety, or agitation.. General Notes: her left leg is completely healed. The right leg is a tiny area open and epithelializing well. Integumentary (Hair, Skin) No suspicious lesions. No crepitus or fluctuance. No peri-wound warmth or erythema. No  masses.. Wound #1 status is Open. Original cause of wound was Trauma. The wound is located on the Left,Anterior Lower Leg. The wound measures 0.3cm length x 0.2cm width x 0.1cm depth; 0.047cm^2 area and 0.005cm^3 volume. The wound is limited to skin breakdown. There is a none present amount of drainage noted. The wound margin is indistinct and nonvisible. There is large (67-100%) granulation within the wound bed. There is a small (1-33%) amount of necrotic tissue within the wound bed including Eschar. The periwound skin appearance did not exhibit: Callus, Crepitus, Excoriation, Fluctuance, Friable, Induration, Localized Edema, Rash, Scarring, Dry/Scaly, Maceration, Moist, Atrophie Blanche, Cyanosis, Ecchymosis, Hemosiderin Staining, Mottled, Pallor, Rubor, Erythema. General Notes: Dressing stuck to wound. Wound #2 status is Healed - Epithelialized. Original cause of wound was Trauma. The wound is located on the Right,Anterior Lower Leg. The wound measures 0cm length x 0cm width x 0cm depth; 0cm^2 area and 0cm^3 volume. Assessment Feick, Morayma M. 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 I anticipate she will be healed by next week. Plan Wound Cleansing: Wound #1 Left,Anterior Lower Leg: Clean wound with Normal Saline. Primary Wound Dressing: Wound #1 Left,Anterior Lower Leg: Other: - Mepitel Secondary Dressing: Wound #1 Left,Anterior Lower Leg: Boardered Foam Dressing Dressing Change Frequency: Wound #1  Left,Anterior Lower Leg: Change dressing every week - Do not change dressing unless it gets wet. Follow-up Appointments: Wound #1 Left,Anterior Lower Leg: Return Appointment in 1 week. I have recommended a piece of Mepitel and bordered foam over the right leg and to leave it intact for a week if possible. I anticipate she will be healed by next week. Electronic Signature(s) Signed: 04/11/2015 2:42:16 PM By: Christin Fudge MD, FACS Entered By: Christin Fudge on 04/11/2015 14:42:16 Stout, [name] ([id number]) Debold, [name] ([id number]) -------------------------------------------------------------------------------- SuperBill Details Patient Name: Wrobleski, Leianne M. Date of Service: 04/11/2015 Medical Record Number: [id number] Patient Account Number: [id number] Date of Birth/Sex: 1924-12-05 (81 y.o. Female) Treating RN: Cornell Barman Primary Care Physician: Derinda Late Other Clinician: Referring Physician: BABAOFF, MARCUS Treating Physician/Extender: Frann Rider in Treatment: 4 Diagnosis Coding ICD-10 Codes Code Description [phone] Laceration without foreign body, right lower leg, initial encounter S81.812A Laceration without foreign body, left lower leg, initial encounter C90.00 Multiple myeloma not having achieved remission L97.212 Non-pressure chronic ulcer of right  calf with fat layer exposed L97.222 Non-pressure chronic ulcer of left calf with fat layer exposed Facility Procedures CPT4 Code: [id number] Description: [phone] - WOUND CARE VISIT-LEV 2 EST PT Modifier: Quantity: 1 Physician Procedures CPT4 Code Description: [id number] - WC PHYS LEVEL 3 - EST PT ICD-10 Description Diagnosis S81.811A Laceration without foreign body, right lower leg, init S81.812A Laceration without foreign body, left lower leg, initi C90.00 Multiple myeloma not  having achieved remission L97.212 Non-pressure chronic ulcer of right calf with fat laye Modifier: ial encounte al encounter r  exposed Quantity: 1 r Electronic Signature(s) Signed: 04/11/2015 2:42:34 PM By: Christin Fudge MD, FACS Entered By: Christin Fudge on 04/11/2015 14:42:34

## 2015-04-11 NOTE — Progress Notes (Addendum)
[name], [name] ([id number]) Visit Report for 04/11/2015 Arrival Information Details Patient Name: [name]. Date of Service: 04/11/2015 2:15 PM Medical Record Number: [id number] Patient Account Number: [id number] Date of Birth/Sex: Sep 08, 1924 (81 y.o. Female) Treating RN: Cornell Barman Primary Care Physician: Derinda Late Other Clinician: Referring Physician: BABAOFF, MARCUS Treating Physician/Extender: Frann Rider in Treatment: 4 Visit Information History Since Last Visit Added or deleted any medications: No Patient Arrived: Cane Any new allergies or adverse reactions: No Arrival Time: 14:20 Had a fall or experienced change in No Accompanied By: friend activities of daily living that may affect Transfer Assistance: None risk of falls: Patient Identification Verified: Yes Signs or symptoms of abuse/neglect since last No Secondary Verification Process Completed: Yes visito Patient Requires Transmission-Based No Has Dressing in Place as Prescribed: Yes Precautions: Has Compression in Place as Prescribed: Yes Patient Has Alerts: No Pain Present Now: No Electronic Signature(s) Signed: 04/11/2015 4:23:10 PM By: Gretta Cool, RN, BSN, Kim RN, BSN Entered By: Gretta Cool, RN, BSN, Kim on 04/11/2015 14:20:42 [name] ([id number]) -------------------------------------------------------------------------------- Clinic Level of Care Assessment Details Patient Name: [name], [name] Date of Service: 04/11/2015 2:15 PM Medical Record Number: [id number] Patient Account Number: [id number] Date of Birth/Sex: [birth year] (81 y.o. Female) Treating RN: Cornell Barman Primary Care Physician: Derinda Late Other Clinician: Referring Physician: BABAOFF, MARCUS Treating Physician/Extender: Frann Rider in Treatment: 4 Clinic Level of Care Assessment Items TOOL 4 Quantity Score []  - Use when only an EandM is performed on FOLLOW-UP visit 0 ASSESSMENTS - Nursing Assessment /  Reassessment []  - Reassessment of Co-morbidities (includes updates in patient status) 0 X - Reassessment of Adherence to Treatment Plan 1 5 ASSESSMENTS - Wound and Skin Assessment / Reassessment X - Simple Wound Assessment / Reassessment - one wound 1 5 []  - Complex Wound Assessment / Reassessment - multiple wounds 0 []  - Dermatologic / Skin Assessment (not related to wound area) 0 ASSESSMENTS - Focused Assessment []  - Circumferential Edema Measurements - multi extremities 0 []  - Nutritional Assessment / Counseling / Intervention 0 []  - Lower Extremity Assessment (monofilament, tuning fork, pulses) 0 []  - Peripheral Arterial Disease Assessment (using hand held doppler) 0 ASSESSMENTS - Ostomy and/or Continence Assessment and Care []  - Incontinence Assessment and Management 0 []  - Ostomy Care Assessment and Management (repouching, etc.) 0 PROCESS - Coordination of Care X - Simple Patient / Family Education for ongoing care 1 15 []  - Complex (extensive) Patient / Family Education for ongoing care 0 []  - Staff obtains Programmer, systems, Records, Test Results / Process Orders 0 []  - Staff telephones HHA, Nursing Homes / Clarify orders / etc 0 []  - Routine Transfer to another Facility (non-emergent condition) 0 [name], [name] ([id number]) []  - Routine Hospital Admission (non-emergent condition) 0 []  - New Admissions / Biomedical engineer / Ordering NPWT, Apligraf, etc. 0 []  - Emergency Hospital Admission (emergent condition) 0 X - Simple Discharge Coordination 1 10 []  - Complex (extensive) Discharge Coordination 0 PROCESS - Special Needs []  - Pediatric / Minor Patient Management 0 []  - Isolation Patient Management 0 []  - Hearing / Language / Visual special needs 0 []  - Assessment of Community assistance (transportation, D/C planning, etc.) 0 []  - Additional assistance / Altered mentation 0 []  - Support Surface(s) Assessment (bed, cushion, seat, etc.) 0 INTERVENTIONS - Wound Cleansing /  Measurement X - Simple Wound Cleansing - one wound 1 5 []  - Complex Wound Cleansing - multiple wounds 0 X - Wound Imaging (photographs - any number  of wounds) 1 5 []  - Wound Tracing (instead of photographs) 0 X - Simple Wound Measurement - one wound 1 5 []  - Complex Wound Measurement - multiple wounds 0 INTERVENTIONS - Wound Dressings X - Small Wound Dressing one or multiple wounds 1 10 []  - Medium Wound Dressing one or multiple wounds 0 []  - Large Wound Dressing one or multiple wounds 0 []  - Application of Medications - topical 0 []  - Application of Medications - injection 0 INTERVENTIONS - Miscellaneous []  - External ear exam 0 [name], [name] ([id number]) []  - Specimen Collection (cultures, biopsies, blood, body fluids, etc.) 0 []  - Specimen(s) / Culture(s) sent or taken to Lab for analysis 0 []  - Patient Transfer (multiple staff / Harrel Lemon Lift / Similar devices) 0 []  - Simple Staple / Suture removal (25 or less) 0 []  - Complex Staple / Suture removal (26 or more) 0 []  - Hypo / Hyperglycemic Management (close monitor of Blood Glucose) 0 []  - Ankle / Brachial Index (ABI) - do not check if billed separately 0 X - Vital Signs 1 5 Has the patient been seen at the hospital within the last three years: Yes Total Score: 65 Level Of Care: New/Established - Level 2 Electronic Signature(s) Signed: 04/11/2015 4:23:10 PM By: Gretta Cool, RN, BSN, Kim RN, BSN Entered By: Gretta Cool, RN, BSN, Kim on 04/11/2015 14:38:43 [name] ([id number]) -------------------------------------------------------------------------------- Encounter Discharge Information Details Patient Name: [name], [name] Date of Service: 04/11/2015 2:15 PM Medical Record Number: [id number] Patient Account Number: [id number] Date of Birth/Sex: [birth year] (81 y.o. Female) Treating RN: Cornell Barman Primary Care Physician: Derinda Late Other Clinician: Referring Physician: BABAOFF, MARCUS Treating Physician/Extender: Frann Rider in Treatment: 4 Encounter Discharge Information Items Discharge Pain Level: 0 Discharge Condition: Stable Ambulatory Status: Ambulatory Discharge Destination: Home Transportation: Other Accompanied By: friend Schedule Follow-up Appointment: Yes Medication Reconciliation completed and provided to Patient/Care Yes Mercer Peifer: Provided on Clinical Summary of Care: 04/11/2015 Form Type Recipient Paper Patient MM Electronic Signature(s) Signed: 04/12/2015 11:40:48 AM By: Ruthine Dose Previous Signature: 04/11/2015 2:47:18 PM Version By: Ruthine Dose Entered By: Ruthine Dose on 04/12/2015 11:40:48 Ainley, Dameisha M. ([id number]) -------------------------------------------------------------------------------- Lower Extremity Assessment Details Patient Name: Mangual, Jereline M. Date of Service: 04/11/2015 2:15 PM Medical Record Number: [id number] Patient Account Number: [id number] Date of Birth/Sex: [birth year] (81 y.o. Female) Treating RN: Cornell Barman Primary Care Physician: Derinda Late Other Clinician: Referring Physician: BABAOFF, MARCUS Treating Physician/Extender: Frann Rider in Treatment: 4 Vascular Assessment Pulses: Posterior Tibial Dorsalis Pedis Palpable: [Left:Yes] [Right:Yes] Extremity colors, hair growth, and conditions: Extremity Color: [Left:Normal] [Right:Normal] Hair Growth on Extremity: [Left:No] [Right:No] Temperature of Extremity: [Left:Cool] [Right:Cool] Capillary Refill: [Left:< 3 seconds] [Right:< 3 seconds] Toe Nail Assessment Left: Right: Thick: No No Discolored: No No Deformed: No No Improper Length and Hygiene: No No Electronic Signature(s) Signed: 04/11/2015 4:23:10 PM By: Gretta Cool, RN, BSN, Kim RN, BSN Entered By: Gretta Cool, RN, BSN, Kim on 04/11/2015 14:27:31 Daher, Telissa M. ([id number]) -------------------------------------------------------------------------------- Multi Wound Chart Details Patient Name: Boddy, Debe Coder M. Date of  Service: 04/11/2015 2:15 PM Medical Record Number: [id number] Patient Account Number: [id number] Date of Birth/Sex: 1925/06/02 (81 y.o. Female) Treating RN: Cornell Barman Primary Care Physician: Derinda Late Other Clinician: Referring Physician: BABAOFF, MARCUS Treating Physician/Extender: Frann Rider in Treatment: 4 Vital Signs Height(in): 62 Pulse(bpm): 71 Weight(lbs): 103 Blood Pressure 148/64 (mmHg): Body Mass Index(BMI): 19 Temperature(F): 98.1 Respiratory Rate 18 (breaths/min): Photos: [1:No Photos] [2:No Photos] [N/A:N/A] Wound Location: [1:Left Lower Leg - Anterior] [2:Right, Anterior  Lower Leg] [N/A:N/A] Wounding Event: [1:Trauma] [2:Trauma] [N/A:N/A] Primary Etiology: [1:Trauma, Other] [2:Trauma, Other] [N/A:N/A] Comorbid History: [1:Anemia, Arrhythmia, Coronary Artery Disease, Hypertension, Osteoarthritis, Received Chemotherapy] [2:N/A] [N/A:N/A] Date Acquired: [1:02/07/2015] [2:02/07/2015] [N/A:N/A] Weeks of Treatment: [1:4] [2:4] [N/A:N/A] Wound Status: [1:Open] [2:Healed - Epithelialized] [N/A:N/A] Measurements L x W x D 0.2x0.2x0.1 [2:0x0x0] [N/A:N/A] (cm) Area (cm) : [1:0.031] [2:0] [N/A:N/A] Volume (cm) : [1:0.003] [2:0] [N/A:N/A] % Reduction in Area: [1:97.30%] [2:N/A] [N/A:N/A] % Reduction in Volume: 97.30% [2:N/A] [N/A:N/A] Classification: [1:Full Thickness Without Exposed Support Structures] [2:Full Thickness Without Exposed Support Structures] [N/A:N/A] Exudate Amount: [1:None Present] [2:N/A] [N/A:N/A] Wound Margin: [1:Indistinct, nonvisible] [2:N/A] [N/A:N/A] Granulation Amount: [1:Large (67-100%)] [2:N/A] [N/A:N/A] Necrotic Amount: [1:Small (1-33%)] [2:N/A] [N/A:N/A] Necrotic Tissue: [1:Eschar] [2:N/A] [N/A:N/A] Exposed Structures: [1:Fascia: No Fat: No Tendon: No Muscle: No] [2:N/A] [N/A:N/A] Joint: No Bone: No Limited to Skin Breakdown Periwound Skin Texture: Edema: No No Abnormalities Noted N/A Excoriation: No Induration:  No Callus: No Crepitus: No Fluctuance: No Friable: No Rash: No Scarring: No Periwound Skin Maceration: No No Abnormalities Noted N/A Moisture: Moist: No Dry/Scaly: No Periwound Skin Color: Atrophie Blanche: No No Abnormalities Noted N/A Cyanosis: No Ecchymosis: No Erythema: No Hemosiderin Staining: No Mottled: No Pallor: No Rubor: No Tenderness on No No N/A Palpation: Wound Preparation: Ulcer Cleansing: N/A N/A Rinsed/Irrigated with Saline Topical Anesthetic Applied: Other: ldocaine 4% Assessment Notes: Dressing stuck to wound. N/A N/A Treatment Notes Electronic Signature(s) Signed: 04/11/2015 4:23:10 PM By: Gretta Cool, RN, BSN, Kim RN, BSN Entered By: Gretta Cool, RN, BSN, Kim on 04/11/2015 14:29:57 Berke, Towanda [name] ([id number]) -------------------------------------------------------------------------------- [place] Details Patient Name: Wlodarczyk, Briceida M. Date of Service: 04/11/2015 2:15 PM Medical Record Number: [id number] Patient Account Number: [id number] Date of Birth/Sex: Aug 12, 1924 (81 y.o. Female) Treating RN: Cornell Barman Primary Care Physician: Derinda Late Other Clinician: Referring Physician: BABAOFF, MARCUS Treating Physician/Extender: Frann Rider in Treatment: 4 Active Inactive Abuse / Safety / Falls / Self Care Management Nursing Diagnoses: Impaired physical mobility Potential for falls Self care deficit: actual or potential Goals: Patient/caregiver will verbalize understanding of skin care regimen Date Initiated: 03/10/2015 Goal Status: Active Patient/caregiver will verbalize/demonstrate measure taken to improve self care Date Initiated: 03/10/2015 Goal Status: Active Patient/caregiver will verbalize/demonstrate measures taken to improve the patient's personal safety Date Initiated: 03/10/2015 Goal Status: Active Patient/caregiver will verbalize/demonstrate measures taken to prevent injury and/or falls Date Initiated:  03/10/2015 Goal Status: Active Patient/caregiver will verbalize/demonstrate understanding of what to do in case of emergency Date Initiated: 03/10/2015 Goal Status: Active Interventions: Assess fall risk on admission and as needed Assess self care needs on admission and as needed Provide education on basic hygiene Provide education on fall prevention Provide education on personal and home safety Provide education on safe transfers Treatment Activities: Education provided on Basic Hygiene : 03/10/2015 Amory, CHRYS LANDGREBE ([id number]) Notes: Orientation to the Wound Care Program Nursing Diagnoses: Knowledge deficit related to the wound healing center program Goals: Patient/caregiver will verbalize understanding of the [place] Date Initiated: 03/10/2015 Goal Status: Active Interventions: Provide education on orientation to the wound center Notes: Wound/Skin Impairment Nursing Diagnoses: Impaired tissue integrity Knowledge deficit related to ulceration/compromised skin integrity Goals: Patient/caregiver will verbalize understanding of skin care regimen Date Initiated: 03/10/2015 Goal Status: Active Ulcer/skin breakdown will have a volume reduction of 30% by week 4 Date Initiated: 03/10/2015 Goal Status: Active Ulcer/skin breakdown will have a volume reduction of 50% by week 8 Date Initiated: 03/10/2015 Goal Status: Active Ulcer/skin breakdown will have a volume reduction of 80% by  week 12 Date Initiated: 03/10/2015 Goal Status: Active Ulcer/skin breakdown will heal within 14 weeks Date Initiated: 03/10/2015 Goal Status: Active Interventions: Assess patient/caregiver ability to obtain necessary supplies Assess ulceration(s) every visit Provide education on ulcer and skin care Treatment Activities: DIAVION, LABRADOR ([id number]) Referred to DME Sabino Denning for dressing supplies : 04/11/2015 Skin care regimen initiated : 04/11/2015 Topical wound management initiated  : 04/11/2015 Notes: Electronic Signature(s) Signed: 04/11/2015 4:23:10 PM By: Gretta Cool, RN, BSN, Kim RN, BSN Entered By: Gretta Cool, RN, BSN, Kim on 04/11/2015 14:36:33 Erichsen, Towanda [name] ([id number]) -------------------------------------------------------------------------------- Pain Assessment Details Patient Name: Rowen, Pernell M. Date of Service: 04/11/2015 2:15 PM Medical Record Number: [id number] Patient Account Number: [id number] Date of Birth/Sex: 1925-04-04 (81 y.o. Female) Treating RN: Cornell Barman Primary Care Physician: Derinda Late Other Clinician: Referring Physician: Derinda Late Treating Physician/Extender: Frann Rider in Treatment: 4 Active Problems Location of Pain Severity and Description of Pain Patient Has Paino No Site Locations Pain Management and Medication Current Pain Management: Electronic Signature(s) Signed: 04/11/2015 4:23:10 PM By: Gretta Cool, RN, BSN, Kim RN, BSN Entered By: Gretta Cool, RN, BSN, Kim on 04/11/2015 14:20:48 Armentor, Towanda [name] ([id number]) -------------------------------------------------------------------------------- Patient/Caregiver Education Details Patient Name: Bourke, Debe Coder M. Date of Service: 04/11/2015 2:15 PM Medical Record Number: [id number] Patient Account Number: [id number] Date of Birth/Gender: January 31, 1925 (81 y.o. Female) Treating RN: Cornell Barman Primary Care Physician: Derinda Late Other Clinician: Referring Physician: Derinda Late Treating Physician/Extender: Frann Rider in Treatment: 4 Education Assessment Education Provided To: Patient Education Topics Provided Wound/Skin Impairment: Handouts: Caring for Your Ulcer, Other: continue wound care as prescribed Methods: Demonstration Responses: State content correctly Electronic Signature(s) Signed: 04/11/2015 4:23:10 PM By: Gretta Cool, RN, BSN, Kim RN, BSN Entered By: Gretta Cool, RN, BSN, Kim on 04/11/2015 14:40:42 Radloff, Towanda [name]  ([id number]) -------------------------------------------------------------------------------- Wound Assessment Details Patient Name: Larose, Margorie M. Date of Service: 04/11/2015 2:15 PM Medical Record Number: [id number] Patient Account Number: [id number] Date of Birth/Sex: 11-21-1924 (81 y.o. Female) Treating RN: Cornell Barman Primary Care Physician: Derinda Late Other Clinician: Referring Physician: BABAOFF, MARCUS Treating Physician/Extender: Frann Rider in Treatment: 4 Wound Status Wound Number: 1 Primary Trauma, Other Etiology: Wound Location: Left, Anterior Lower Leg Wound Open Wounding Event: Trauma Status: Date Acquired: 02/07/2015 Comorbid Anemia, Arrhythmia, Coronary Artery Weeks Of Treatment: 4 History: Disease, Hypertension, Osteoarthritis, Clustered Wound: No Received Chemotherapy Photos Photo Uploaded By: Gretta Cool, RN, BSN, Kim on 04/11/2015 17:04:14 Wound Measurements Length: (cm) 0.3 Width: (cm) 0.2 Depth: (cm) 0.1 Area: (cm) 0.047 Volume: (cm) 0.005 % Reduction in Area: 95.8% % Reduction in Volume: 95.6% Wound Description Full Thickness Without Exposed Classification: Support Structures Wound Margin: Indistinct, nonvisible None Present Basden, Jaleeyah M. ([id number]) Exudate Amount: Wound Bed Granulation Amount: Large (67-100%) Exposed Structure Necrotic Amount: Small (1-33%) Fascia Exposed: No Necrotic Quality: Eschar Fat Layer Exposed: No Tendon Exposed: No Muscle Exposed: No Joint Exposed: No Bone Exposed: No Limited to Skin Breakdown Periwound Skin Texture Texture Color No Abnormalities Noted: No No Abnormalities Noted: No Callus: No Atrophie Blanche: No Crepitus: No Cyanosis: No Excoriation: No Ecchymosis: No Fluctuance: No Erythema: No Friable: No Hemosiderin Staining: No Induration: No Mottled: No Localized Edema: No Pallor: No Rash: No Rubor: No Scarring: No Moisture No Abnormalities Noted: No Dry / Scaly:  No Maceration: No Moist: No Wound Preparation Ulcer Cleansing: Rinsed/Irrigated with Saline Topical Anesthetic Applied: Other: ldocaine 4%, Assessment Notes Dressing stuck to wound. Treatment Notes Wound #1 (Left, Anterior Lower Leg) 1. Cleansed with: Clean wound with Normal Saline 2. Anesthetic Topical Lidocaine  4% cream to wound bed prior to debridement 4. Dressing Applied: Other dressing (specify in notes) 5. Secondary Dressing Applied Knipfer, Jhane M. ([id number]) Bordered Foam Dressing Notes Mepilex and bordered foam dressing for one week Electronic Signature(s) Signed: 04/11/2015 4:23:10 PM By: Gretta Cool, RN, BSN, Kim RN, BSN Entered By: Gretta Cool, RN, BSN, Kim on 04/11/2015 14:32:24 Gadberry, Towanda [name] ([id number]) -------------------------------------------------------------------------------- Wound Assessment Details Patient Name: Purdie, Aminah M. Date of Service: 04/11/2015 2:15 PM Medical Record Number: [id number] Patient Account Number: [id number] Date of Birth/Sex: September 19, 1924 (81 y.o. Female) Treating RN: Cornell Barman Primary Care Physician: Derinda Late Other Clinician: Referring Physician: BABAOFF, MARCUS Treating Physician/Extender: Frann Rider in Treatment: 4 Wound Status Wound Number: 2 Primary Etiology: Trauma, Other Wound Location: Right, Anterior Lower Leg Wound Status: Healed - Epithelialized Wounding Event: Trauma Date Acquired: 02/07/2015 Weeks Of Treatment: 4 Clustered Wound: No Photos Photo Uploaded By: Gretta Cool, RN, BSN, Kim on 04/11/2015 17:04:14 Wound Measurements Length: (cm) Width: (cm) Depth: (cm) Area: (cm) Volume: (cm) 0 % Reduction in Area: 0 % Reduction in Volume: 0 0 0 Wound Description Full Thickness Without Exposed Classification: Support Structures Periwound Skin Texture Texture Color No Abnormalities Noted: No No Abnormalities Noted: No Moisture No Abnormalities Noted: No Electronic Signature(s) Signed:  04/11/2015 4:23:10 PM By: Gretta Cool, RN, BSN, Kim RN, BSN Amico, [place] ([id number]) Entered By: Gretta Cool, RN, BSN, Kim on 04/11/2015 14:29:36 Simek, Towanda [name] ([id number]) -------------------------------------------------------------------------------- [place] Details Patient Name: Sherman, Yamila M. Date of Service: 04/11/2015 2:15 PM Medical Record Number: [id number] Patient Account Number: [id number] Date of Birth/Sex: [birth year] (81 y.o. Female) Treating RN: Cornell Barman Primary Care Physician: Derinda Late Other Clinician: Referring Physician: BABAOFF, MARCUS Treating Physician/Extender: Frann Rider in Treatment: 4 Vital Signs Time Taken: 14:21 Temperature (F): 98.1 Height (in): 62 Pulse (bpm): 71 Weight (lbs): 103 Respiratory Rate (breaths/min): 18 Body Mass Index (BMI): 18.8 Blood Pressure (mmHg): 148/64 Reference Range: 80 - 120 mg / dl Electronic Signature(s) Signed: 04/11/2015 4:23:10 PM By: Gretta Cool, RN, BSN, Kim RN, BSN Entered By: Gretta Cool, RN, BSN, Kim on 04/11/2015 14:21:38

## 2015-04-20 ENCOUNTER — Encounter: Payer: Medicare Other | Admitting: Surgery

## 2015-04-20 DIAGNOSIS — L97212 Non-pressure chronic ulcer of right calf with fat layer exposed: Secondary | ICD-10-CM | POA: Diagnosis not present

## 2015-04-20 DIAGNOSIS — S81812A Laceration without foreign body, left lower leg, initial encounter: Secondary | ICD-10-CM | POA: Diagnosis not present

## 2015-04-20 DIAGNOSIS — S81811A Laceration without foreign body, right lower leg, initial encounter: Secondary | ICD-10-CM | POA: Diagnosis not present

## 2015-04-20 NOTE — Progress Notes (Signed)
[name], [name] ([id number]) Visit Report for 04/20/2015 Arrival Information Details Patient Name: [name], [name]. Date of Service: 04/20/2015 11:00 AM Medical Record Number: [id number] Patient Account Number: [id number] Date of Birth/Sex: 1925-02-21 (81 y.o. Female) Treating RN: Cornell Barman Primary Care Physician: Derinda Late Other Clinician: Referring Physician: BABAOFF, MARCUS Treating Physician/Extender: Frann Rider in Treatment: 5 Visit Information History Since Last Visit Added or deleted any medications: No Patient Arrived: Cane Any new allergies or adverse reactions: No Arrival Time: 11:29 Had a fall or experienced change in No Accompanied By: friend activities of daily living that may affect Transfer Assistance: None risk of falls: Patient Identification Verified: Yes Signs or symptoms of abuse/neglect since last No Secondary Verification Process Completed: Yes visito Patient Requires Transmission-Based No Hospitalized since last visit: No Precautions: Has Dressing in Place as Prescribed: Yes Patient Has Alerts: No Pain Present Now: No Electronic Signature(s) Signed: 04/20/2015 11:56:10 AM By: Gretta Cool, RN, BSN, Kim RN, BSN Entered By: Gretta Cool, RN, BSN, Kim on 04/20/2015 11:29:53 Pfeifer, [name] ([id number]) -------------------------------------------------------------------------------- Clinic Level of Care Assessment Details Patient Name: [name], [name] Date of Service: 04/20/2015 11:00 AM Medical Record Number: [id number] Patient Account Number: [id number] Date of Birth/Sex: 1924-09-29 (81 y.o. Female) Treating RN: Cornell Barman Primary Care Physician: Derinda Late Other Clinician: Referring Physician: BABAOFF, MARCUS Treating Physician/Extender: Frann Rider in Treatment: 5 Clinic Level of Care Assessment Items TOOL 4 Quantity Score []  - Use when only an EandM is performed on FOLLOW-UP visit 0 ASSESSMENTS - Nursing Assessment /  Reassessment X - Reassessment of Co-morbidities (includes updates in patient status) 1 10 []  - Reassessment of Adherence to Treatment Plan 0 ASSESSMENTS - Wound and Skin Assessment / Reassessment X - Simple Wound Assessment / Reassessment - one wound 1 5 []  - Complex Wound Assessment / Reassessment - multiple wounds 0 []  - Dermatologic / Skin Assessment (not related to wound area) 0 ASSESSMENTS - Focused Assessment []  - Circumferential Edema Measurements - multi extremities 0 []  - Nutritional Assessment / Counseling / Intervention 0 []  - Lower Extremity Assessment (monofilament, tuning fork, pulses) 0 []  - Peripheral Arterial Disease Assessment (using hand held doppler) 0 ASSESSMENTS - Ostomy and/or Continence Assessment and Care []  - Incontinence Assessment and Management 0 []  - Ostomy Care Assessment and Management (repouching, etc.) 0 PROCESS - Coordination of Care []  - Simple Patient / Family Education for ongoing care 0 []  - Complex (extensive) Patient / Family Education for ongoing care 0 []  - Staff obtains Programmer, systems, Records, Test Results / Process Orders 0 []  - Staff telephones HHA, Nursing Homes / Clarify orders / etc 0 []  - Routine Transfer to another Facility (non-emergent condition) 0 Damiani, Eveline M. ([id number]) []  - Routine Hospital Admission (non-emergent condition) 0 []  - New Admissions / Biomedical engineer / Ordering NPWT, Apligraf, etc. 0 []  - Emergency Hospital Admission (emergent condition) 0 X - Simple Discharge Coordination 1 10 []  - Complex (extensive) Discharge Coordination 0 PROCESS - Special Needs []  - Pediatric / Minor Patient Management 0 []  - Isolation Patient Management 0 []  - Hearing / Language / Visual special needs 0 []  - Assessment of Community assistance (transportation, D/C planning, etc.) 0 []  - Additional assistance / Altered mentation 0 []  - Support Surface(s) Assessment (bed, cushion, seat, etc.) 0 INTERVENTIONS - Wound Cleansing /  Measurement []  - Simple Wound Cleansing - one wound 0 []  - Complex Wound Cleansing - multiple wounds 0 X - Wound Imaging (photographs - any number of wounds) 1 5 []  -  Wound Tracing (instead of photographs) 0 []  - Simple Wound Measurement - one wound 0 []  - Complex Wound Measurement - multiple wounds 0 INTERVENTIONS - Wound Dressings []  - Small Wound Dressing one or multiple wounds 0 []  - Medium Wound Dressing one or multiple wounds 0 []  - Large Wound Dressing one or multiple wounds 0 []  - Application of Medications - topical 0 []  - Application of Medications - injection 0 INTERVENTIONS - Miscellaneous []  - External ear exam 0 Hennings, Akiko M. ([id number]) []  - Specimen Collection (cultures, biopsies, blood, body fluids, etc.) 0 []  - Specimen(s) / Culture(s) sent or taken to Lab for analysis 0 []  - Patient Transfer (multiple staff / Harrel Lemon Lift / Similar devices) 0 []  - Simple Staple / Suture removal (25 or less) 0 []  - Complex Staple / Suture removal (26 or more) 0 []  - Hypo / Hyperglycemic Management (close monitor of Blood Glucose) 0 []  - Ankle / Brachial Index (ABI) - do not check if billed separately 0 X - Vital Signs 1 5 Has the patient been seen at the hospital within the last three years: Yes Total Score: 35 Level Of Care: New/Established - Level 1 Electronic Signature(s) Signed: 04/20/2015 11:56:10 AM By: Gretta Cool, RN, BSN, Kim RN, BSN Entered By: Gretta Cool, RN, BSN, Kim on 04/20/2015 11:43:26 Mceachron, [name] ([id number]) -------------------------------------------------------------------------------- Encounter Discharge Information Details Patient Name: [name], [name] Date of Service: 04/20/2015 11:00 AM Medical Record Number: [id number] Patient Account Number: [id number] Date of Birth/Sex: December 05, 1924 (81 y.o. Female) Treating RN: Cornell Barman Primary Care Physician: Derinda Late Other Clinician: Referring Physician: BABAOFF, MARCUS Treating Physician/Extender: Frann Rider in Treatment: 5 Encounter Discharge Information Items Discharge Pain Level: 0 Discharge Condition: Stable Ambulatory Status: Ambulatory Discharge Destination: Home Private Transportation: Auto Accompanied By: friend Schedule Follow-up Appointment: Yes Medication Reconciliation completed and Yes provided to Patient/Care Thia Olesen: Clinical Summary of Care: Electronic Signature(s) Signed: 04/20/2015 11:56:10 AM By: Gretta Cool, RN, BSN, Kim RN, BSN Entered By: Gretta Cool, RN, BSN, Kim on 04/20/2015 11:43:46 Dowlen, [name] ([id number]) -------------------------------------------------------------------------------- Lower Extremity Assessment Details Patient Name: Brouillard, Camay M. Date of Service: 04/20/2015 11:00 AM Medical Record Number: [id number] Patient Account Number: [id number] Date of Birth/Sex: April 26, 1925 (81 y.o. Female) Treating RN: Cornell Barman Primary Care Physician: Derinda Late Other Clinician: Referring Physician: BABAOFF, MARCUS Treating Physician/Extender: Frann Rider in Treatment: 5 Vascular Assessment Pulses: Posterior Tibial Dorsalis Pedis Palpable: [Right:Yes] Extremity colors, hair growth, and conditions: Extremity Color: [Right:Normal] Hair Growth on Extremity: [Right:No] Temperature of Extremity: [Right:Warm] Capillary Refill: [Right:< 3 seconds] Toe Nail Assessment Left: Right: Thick: No Discolored: No Deformed: No Improper Length and Hygiene: No Electronic Signature(s) Signed: 04/20/2015 11:56:10 AM By: Gretta Cool, RN, BSN, Kim RN, BSN Entered By: Gretta Cool, RN, BSN, Kim on 04/20/2015 11:34:38 Tromp, Iliani M. ([id number]) -------------------------------------------------------------------------------- Multi Wound Chart Details Patient Name: Mwangi, Debe Coder M. Date of Service: 04/20/2015 11:00 AM Medical Record Number: [id number] Patient Account Number: [id number] Date of Birth/Sex: 1924-07-09 (81 y.o. Female) Treating RN: Cornell Barman Primary Care Physician: Derinda Late Other Clinician: Referring Physician: BABAOFF, MARCUS Treating Physician/Extender: Frann Rider in Treatment: 5 Vital Signs Height(in): 62 Pulse(bpm): 66 Weight(lbs): 103 Blood Pressure 149/60 (mmHg): Body Mass Index(BMI): 19 Temperature(F): 97.4 Respiratory Rate 18 (breaths/min): Photos: [1:No Photos] [N/A:N/A] Wound Location: [1:Left, Anterior Lower Leg] [N/A:N/A] Wounding Event: [1:Trauma] [N/A:N/A] Primary Etiology: [1:Trauma, Other] [N/A:N/A] Date Acquired: [1:02/07/2015] [N/A:N/A] Weeks of Treatment: [1:5] [N/A:N/A] Wound Status: [1:Healed - Epithelialized] [N/A:N/A] Measurements L x W x D 0x0x0 [N/A:N/A] (cm) Area (  cm) : [1:0] [N/A:N/A] Volume (cm) : [1:0] [N/A:N/A] % Reduction in Area: [1:100.00%] [N/A:N/A] % Reduction in Volume: 100.00% [N/A:N/A] Classification: [1:Full Thickness Without Exposed Support Structures] [N/A:N/A] Periwound Skin Texture: No Abnormalities Noted [N/A:N/A] Periwound Skin [1:No Abnormalities Noted] [N/A:N/A] Moisture: Periwound Skin Color: No Abnormalities Noted [N/A:N/A] Tenderness on [1:No] [N/A:N/A] Treatment Notes Electronic Signature(s) Signed: 04/20/2015 11:56:10 AM By: Gretta Cool, RN, BSN, Kim RN, BSN Entered By: Gretta Cool, RN, BSN, Kim on 04/20/2015 11:40:06 Waibel, Dominick M. ([id number]) Brents, [name] ([id number]) -------------------------------------------------------------------------------- [place] Details Patient Name: Straw, Clara M. Date of Service: 04/20/2015 11:00 AM Medical Record Number: [id number] Patient Account Number: [id number] Date of Birth/Sex: [birth year] (81 y.o. Female) Treating RN: Cornell Barman Primary Care Physician: Derinda Late Other Clinician: Referring Physician: Derinda Late Treating Physician/Extender: Frann Rider in Treatment: 5 Active Inactive Electronic Signature(s) Signed: 04/20/2015 11:56:10 AM By: Gretta Cool,  RN, BSN, Kim RN, BSN Entered By: Gretta Cool, RN, BSN, Kim on 04/20/2015 11:39:57 Akhavan, [name] ([id number]) -------------------------------------------------------------------------------- Pain Assessment Details Patient Name: Cicero, Debe Coder M. Date of Service: 04/20/2015 11:00 AM Medical Record Number: [id number] Patient Account Number: [id number] Date of Birth/Sex: 30-Apr-1925 (81 y.o. Female) Treating RN: Cornell Barman Primary Care Physician: Derinda Late Other Clinician: Referring Physician: Derinda Late Treating Physician/Extender: Frann Rider in Treatment: 5 Active Problems Location of Pain Severity and Description of Pain Patient Has Paino No Site Locations Pain Management and Medication Current Pain Management: Electronic Signature(s) Signed: 04/20/2015 11:56:10 AM By: Gretta Cool, RN, BSN, Kim RN, BSN Entered By: Gretta Cool, RN, BSN, Kim on 04/20/2015 11:30:01 Nickle, [name] ([id number]) -------------------------------------------------------------------------------- Patient/Caregiver Education Details Patient Name: Lammert, Debe Coder M. Date of Service: 04/20/2015 11:00 AM Medical Record Number: [id number] Patient Account Number: [id number] Date of Birth/Gender: [birth year] (81 y.o. Female) Treating RN: Cornell Barman Primary Care Physician: Derinda Late Other Clinician: Referring Physician: Derinda Late Treating Physician/Extender: Frann Rider in Treatment: 5 Education Assessment Education Provided To: Patient Education Topics Provided Wound/Skin Impairment: Handouts: Other: moisturize Methods: Demonstration Responses: State content correctly Electronic Signature(s) Signed: 04/20/2015 11:56:10 AM By: Gretta Cool, RN, BSN, Kim RN, BSN Entered By: Gretta Cool, RN, BSN, Kim on 04/20/2015 11:44:10 Laidlaw, [name] ([id number]) -------------------------------------------------------------------------------- Wound Assessment Details Patient Name: Berryman, Daralyn M. Date  of Service: 04/20/2015 11:00 AM Medical Record Number: [id number] Patient Account Number: [id number] Date of Birth/Sex: 12/22/1924 (81 y.o. Female) Treating RN: Cornell Barman Primary Care Physician: Derinda Late Other Clinician: Referring Physician: BABAOFF, MARCUS Treating Physician/Extender: Frann Rider in Treatment: 5 Wound Status Wound Number: 1 Primary Etiology: Trauma, Other Wound Location: Left, Anterior Lower Leg Wound Status: Healed - Epithelialized Wounding Event: Trauma Date Acquired: 02/07/2015 Weeks Of Treatment: 5 Clustered Wound: No Photos Photo Uploaded By: Gretta Cool, RN, BSN, Kim on 04/20/2015 11:51:03 Wound Measurements Length: (cm) 0 % Reducti Width: (cm) 0 % Reducti Depth: (cm) 0 Area: (cm) 0 Volume: (cm) 0 on in Area: 100% on in Volume: 100% Wound Description Full Thickness Without Exposed Classification: Support Structures Periwound Skin Texture Texture Color No Abnormalities Noted: No No Abnormalities Noted: No Moisture No Abnormalities Noted: No Electronic Signature(s) Signed: 04/20/2015 11:56:10 AM By: Gretta Cool, RN, BSN, Kim RN, BSN Sop[place], [place] ([id number]) Entered By: Gretta Cool, RN, BSN, Kim on 04/20/2015 11:34:58 Wiedeman, [name] ([id number]) -------------------------------------------------------------------------------- Vitals Details Patient Name: Smith, Bryssa M. Date of Service: 04/20/2015 11:00 AM Medical Record Number: [id number] Patient Account Number: [id number] Date of Birth/Sex: 1925-03-25 (81 y.o. Female) Treating RN: Cornell Barman Primary Care Physician: Derinda Late Other Clinician: Referring Physician: Derinda Late Treating Physician/Extender:  Britto, Errol Weeks in Treatment: 5 Vital Signs Time Taken: 11:30 Temperature (F): 97.4 Height (in): 62 Pulse (bpm): 66 Weight (lbs): 103 Respiratory Rate (breaths/min): 18 Body Mass Index (BMI): 18.8 Blood Pressure (mmHg): 149/60 Reference Range: 80 - 120 mg /  dl Electronic Signature(s) Signed: 04/20/2015 11:56:10 AM By: Gretta Cool, RN, BSN, Kim RN, BSN Entered By: Gretta Cool, RN, BSN, Kim on 04/20/2015 11:32:37

## 2015-04-21 NOTE — Progress Notes (Signed)
[name], [name] ([id number]) Visit Report for 04/20/2015 Chief Complaint Document Details Patient Name: [name], [name]. 04/20/2015 11:00 Date of Service: AM Medical Record [id number] Number: Patient Account Number: [id number] 1924-12-24 (81 y.o. Treating RN: Cornell Barman Date of Birth/Sex: Female) Other Clinician: Primary Care Physician: BABAOFF, MARCUS Treating Christin Fudge Referring Physician: Derinda Late Physician/Extender: Suella Grove in Treatment: 5 Information Obtained from: Patient Chief Complaint Patient presents to the wound care center for a consult due non healing wound She had a fall and injured both lower extremities 8 weeks ago Electronic Signature(s) Signed: 04/20/2015 11:49:53 AM By: Christin Fudge MD, FACS Entered By: Christin Fudge on 04/20/2015 11:49:52 Tagg, [name] ([id number]) -------------------------------------------------------------------------------- HPI Details Patient Name: Penny Boss M. 04/20/2015 11:00 Date of Service: AM Medical Record [id number] Number: Patient Account Number: [id number] [birth year] (81 y.o. Treating RN: Cornell Barman Date of Birth/Sex: Female) Other Clinician: Primary Care Physician: BABAOFF, MARCUS Treating Christin Fudge Referring Physician: Derinda Late Physician/Extender: Weeks in Treatment: 5 History of Present Illness Location: both lower legs Quality: Patient reports experiencing a sharp pain to affected area(s). Severity: Patient states wound are getting worse. Duration: Patient has had the wound for < 8 weeks prior to presenting for treatment Timing: Pain in wound is constant (hurts all the time) Context: The wound occurred when the patient tripped and fell and had lacerations on both lower extremities Modifying Factors: Other treatment(s) tried include:seen in the ER recently and was put on doxycycline Associated Signs and Symptoms: Patient reports having difficulty standing for long periods. HPI  Description: pleasant 81 year old patient who had a lacerated wound to both lower extremities about 8 weeks ago and has been treating it with local antibody, ointment. She was seen by her oncologist recently who referred her to a surgeon for an unassociated problem and also asked her to get some x-rays which did not show any bone involvement. The patient has recently been put on doxycycline and some pain medications. She has multiple myeloma and is been on treatment for this for several years. Electronic Signature(s) Signed: 04/20/2015 11:49:59 AM By: Christin Fudge MD, FACS Entered By: Christin Fudge on 04/20/2015 11:49:59 Klett, [name] ([id number]) -------------------------------------------------------------------------------- Physical Exam Details Patient Name: Penny Boss M. 04/20/2015 11:00 Date of Service: AM Medical Record [id number] Number: Patient Account Number: [id number] [birth year] (81 y.o. Treating RN: Cornell Barman Date of Birth/Sex: Female) Other Clinician: Primary Care Physician: BABAOFF, MARCUS Treating Christin Fudge Referring Physician: BABAOFF, MARCUS Physician/Extender: Weeks in Treatment: 5 Constitutional . Pulse regular. Respirations normal and unlabored. Afebrile. . Eyes Nonicteric. Reactive to light. Ears, Nose, Mouth, and Throat Lips, teeth, and gums WNL.Marland Kitchen Moist mucosa without lesions . Neck supple and nontender. No palpable supraclavicular or cervical adenopathy. Normal sized without goiter. Respiratory WNL. No retractions.. Cardiovascular Pedal Pulses WNL. No clubbing, cyanosis or edema. Lymphatic No adneopathy. No adenopathy. No adenopathy. Musculoskeletal Adexa without tenderness or enlargement.. Digits and nails w/o clubbing, cyanosis, infection, petechiae, ischemia, or inflammatory conditions.. Integumentary (Hair, Skin) No suspicious lesions. No crepitus or fluctuance. No peri-wound warmth or erythema. No masses.Marland Kitchen Psychiatric Judgement and  insight Intact.. No evidence of depression, anxiety, or agitation.. Notes the right leg is completely healed and there are no further open wounds. Electronic Signature(s) Signed: 04/20/2015 11:50:28 AM By: Christin Fudge MD, FACS Entered By: Christin Fudge on 04/20/2015 11:50:27 [name], [name] ([id number]) -------------------------------------------------------------------------------- Physician Orders Details Patient Name: Penny Boss M. 04/20/2015 11:00 Date of Service: AM Medical Record [id number] Number: Patient Account Number: [id number] 26-May-1925 (81 y.o. Treating RN: Cornell Barman  Date of Birth/Sex: Female) Other Clinician: Primary Care Physician: BABAOFF, MARCUS Treating Abrianna Sidman Referring Physician: Derinda Late Physician/Extender: Suella Grove in Treatment: 5 Verbal / Phone Orders: Yes Clinician: Cornell Barman Read Back and Verified: Yes Diagnosis Coding Discharge From [facility] Services o Discharge from [place] - treatment complete Electronic Signature(s) Signed: 04/20/2015 11:56:10 AM By: Gretta Cool RN, BSN, Kim RN, BSN Signed: 04/20/2015 4:04:17 PM By: Christin Fudge MD, FACS Entered By: Gretta Cool RN, BSN, Kim on 04/20/2015 11:40:36 [name], [name] ([id number]) -------------------------------------------------------------------------------- Problem List Details Patient Name: [name], [name]. 04/20/2015 11:00 Date of Service: AM Medical Record [id number] Number: Patient Account Number: [id number] 04-14-1925 (81 y.o. Treating RN: Cornell Barman Date of Birth/Sex: Female) Other Clinician: Primary Care Physician: BABAOFF, MARCUS Treating Christin Fudge Referring Physician: Derinda Late Physician/Extender: Weeks in Treatment: 5 Active Problems ICD-10 Encounter Code Description Active Date Diagnosis S81.811A Laceration without foreign body, right lower leg, initial 03/10/2015 Yes encounter W10.272Z Laceration without foreign body, left lower leg, initial  03/10/2015 Yes encounter C90.00 Multiple myeloma not having achieved remission 03/10/2015 Yes L97.212 Non-pressure chronic ulcer of right calf with fat layer 03/10/2015 Yes exposed L97.222 Non-pressure chronic ulcer of left calf with fat layer 03/10/2015 Yes exposed Inactive Problems Resolved Problems Electronic Signature(s) Signed: 04/20/2015 11:49:47 AM By: Christin Fudge MD, FACS Entered By: Christin Fudge on 04/20/2015 11:49:47 [name], [name] ([id number]) -------------------------------------------------------------------------------- Progress Note Details Patient Name: Penny Boss M. 04/20/2015 11:00 Date of Service: AM Medical Record [id number] Number: Patient Account Number: [id number] 1925-05-05 (81 y.o. Treating RN: Cornell Barman Date of Birth/Sex: Female) Other Clinician: Primary Care Physician: BABAOFF, MARCUS Treating Christin Fudge Referring Physician: Derinda Late Physician/Extender: Suella Grove in Treatment: 5 Subjective Chief Complaint Information obtained from Patient Patient presents to the wound care center for a consult due non healing wound She had a fall and injured both lower extremities 8 weeks ago History of Present Illness (HPI) The following HPI elements were documented for the patient's wound: Location: both lower legs Quality: Patient reports experiencing a sharp pain to affected area(s). Severity: Patient states wound are getting worse. Duration: Patient has had the wound for < 8 weeks prior to presenting for treatment Timing: Pain in wound is constant (hurts all the time) Context: The wound occurred when the patient tripped and fell and had lacerations on both lower extremities Modifying Factors: Other treatment(s) tried include:seen in the ER recently and was put on doxycycline Associated Signs and Symptoms: Patient reports having difficulty standing for long periods. pleasant 81 year old patient who had a lacerated wound to both lower extremities  about 8 weeks ago and has been treating it with local antibody, ointment. She was seen by her oncologist recently who referred her to a surgeon for an unassociated problem and also asked her to get some x-rays which did not show any bone involvement. The patient has recently been put on doxycycline and some pain medications. She has multiple myeloma and is been on treatment for this for several years. Objective Constitutional Pulse regular. Respirations normal and unlabored. Afebrile. Vitals Time Taken: 11:30 AM, Height: 62 in, Weight: 103 lbs, BMI: 18.8, Temperature: 97.4 F, Pulse: 66 bpm, Respiratory Rate: 18 breaths/min, Blood Pressure: 149/60 mmHg. [name], [name] ([id number]) Eyes Nonicteric. Reactive to light. Ears, Nose, Mouth, and Throat Lips, teeth, and gums WNL.Marland Kitchen Moist mucosa without lesions . Neck supple and nontender. No palpable supraclavicular or cervical adenopathy. Normal sized without goiter. Respiratory WNL. No retractions.. Cardiovascular Pedal Pulses WNL. No clubbing, cyanosis or edema. Lymphatic No adneopathy. No  adenopathy. No adenopathy. Musculoskeletal Adexa without tenderness or enlargement.. Digits and nails w/o clubbing, cyanosis, infection, petechiae, ischemia, or inflammatory conditions.Marland Kitchen Psychiatric Judgement and insight Intact.. No evidence of depression, anxiety, or agitation.. General Notes: the right leg is completely healed and there are no further open wounds. Integumentary (Hair, Skin) No suspicious lesions. No crepitus or fluctuance. No peri-wound warmth or erythema. No masses.. Wound #1 status is Healed - Epithelialized. Original cause of wound was Trauma. The wound is located on the Left,Anterior Lower Leg. The wound measures 0cm length x 0cm width x 0cm depth; 0cm^2 area and 0cm^3 volume. Assessment Active Problems ICD-10 S81.811A - Laceration without foreign body, right lower leg, initial encounter S81.812A - Laceration without  foreign body, left lower leg, initial encounter C90.00 - Multiple myeloma not having achieved remission L97.212 - Non-pressure chronic ulcer of right calf with fat layer exposed L97.222 - Non-pressure chronic ulcer of left calf with fat layer exposed [name], [name] ([id number]) She has done very well and all wounds are healed. She is discharged from the wound care services and be seen back only if required. Plan Discharge From [facility] Services: Discharge from [place] - treatment complete She has done very well and all wounds are healed. She is discharged from the wound care services and be seen back only if required. Electronic Signature(s) Signed: 04/20/2015 11:51:01 AM By: Christin Fudge MD, FACS Entered By: Christin Fudge on 04/20/2015 11:51:00 [name], [name] ([id number]) -------------------------------------------------------------------------------- SuperBill Details Patient Name: Knarr, Lada M. Date of Service: 04/20/2015 Medical Record Number: [id number] Patient Account Number: [id number] Date of Birth/Sex: 1924/09/05 (81 y.o. Female) Treating RN: Cornell Barman Primary Care Physician: Derinda Late Other Clinician: Referring Physician: BABAOFF, MARCUS Treating Physician/Extender: Frann Rider in Treatment: 5 Diagnosis Coding ICD-10 Codes Code Description [phone] Laceration without foreign body, right lower leg, initial encounter S81.812A Laceration without foreign body, left lower leg, initial encounter C90.00 Multiple myeloma not having achieved remission L97.212 Non-pressure chronic ulcer of right calf with fat layer exposed L97.222 Non-pressure chronic ulcer of left calf with fat layer exposed Facility Procedures CPT4 Code: 02409735 Description: 32992 - WOUND CARE VISIT-LEV 1 EST PT Modifier: Quantity: 1 Physician Procedures CPT4 Code Description: [id number] 99213 - WC PHYS LEVEL 3 - EST PT ICD-10 Description Diagnosis S81.811A Laceration without  foreign body, right lower leg, init S81.812A Laceration without foreign body, left lower leg, initi Modifier: ial encounte al encounter Quantity: 1 r Electronic Signature(s) Signed: 04/20/2015 11:51:14 AM By: Christin Fudge MD, FACS Entered By: Christin Fudge on 04/20/2015 11:51:14

## 2015-04-27 ENCOUNTER — Inpatient Hospital Stay (HOSPITAL_BASED_OUTPATIENT_CLINIC_OR_DEPARTMENT_OTHER): Payer: Medicare Other | Admitting: Oncology

## 2015-04-27 ENCOUNTER — Encounter: Payer: Self-pay | Admitting: Oncology

## 2015-04-27 ENCOUNTER — Inpatient Hospital Stay: Payer: Medicare Other | Attending: Oncology

## 2015-04-27 VITALS — BP 156/79 | HR 63 | Temp 96.7°F | Wt 104.1 lb

## 2015-04-27 DIAGNOSIS — L97919 Non-pressure chronic ulcer of unspecified part of right lower leg with unspecified severity: Secondary | ICD-10-CM | POA: Diagnosis not present

## 2015-04-27 DIAGNOSIS — I2510 Atherosclerotic heart disease of native coronary artery without angina pectoris: Secondary | ICD-10-CM

## 2015-04-27 DIAGNOSIS — I10 Essential (primary) hypertension: Secondary | ICD-10-CM | POA: Insufficient documentation

## 2015-04-27 DIAGNOSIS — E65 Localized adiposity: Secondary | ICD-10-CM | POA: Insufficient documentation

## 2015-04-27 DIAGNOSIS — Z79899 Other long term (current) drug therapy: Secondary | ICD-10-CM

## 2015-04-27 DIAGNOSIS — I4891 Unspecified atrial fibrillation: Secondary | ICD-10-CM | POA: Insufficient documentation

## 2015-04-27 DIAGNOSIS — T50995S Adverse effect of other drugs, medicaments and biological substances, sequela: Secondary | ICD-10-CM | POA: Diagnosis not present

## 2015-04-27 DIAGNOSIS — E785 Hyperlipidemia, unspecified: Secondary | ICD-10-CM | POA: Insufficient documentation

## 2015-04-27 DIAGNOSIS — M1990 Unspecified osteoarthritis, unspecified site: Secondary | ICD-10-CM

## 2015-04-27 DIAGNOSIS — M87180 Osteonecrosis due to drugs, jaw: Secondary | ICD-10-CM

## 2015-04-27 DIAGNOSIS — C9002 Multiple myeloma in relapse: Secondary | ICD-10-CM | POA: Diagnosis not present

## 2015-04-27 DIAGNOSIS — L97929 Non-pressure chronic ulcer of unspecified part of left lower leg with unspecified severity: Secondary | ICD-10-CM | POA: Diagnosis not present

## 2015-04-27 LAB — COMPREHENSIVE METABOLIC PANEL
ALBUMIN: 3.7 g/dL (ref 3.5–5.0)
ALT: 15 U/L (ref 14–54)
ANION GAP: 7 (ref 5–15)
AST: 16 U/L (ref 15–41)
Alkaline Phosphatase: 76 U/L (ref 38–126)
BILIRUBIN TOTAL: 0.7 mg/dL (ref 0.3–1.2)
BUN: 20 mg/dL (ref 6–20)
CHLORIDE: 101 mmol/L (ref 101–111)
CO2: 27 mmol/L (ref 22–32)
Calcium: 9 mg/dL (ref 8.9–10.3)
Creatinine, Ser: 0.65 mg/dL (ref 0.44–1.00)
GFR calc Af Amer: >60 mL/min (ref >60)
GFR calc non Af Amer: >60 mL/min (ref >60)
GLUCOSE: 97 mg/dL (ref 65–99)
POTASSIUM: 4.3 mmol/L (ref 3.5–5.1)
SODIUM: 135 mmol/L (ref 135–145)
TOTAL PROTEIN: 6.4 g/dL — AB (ref 6.5–8.1)

## 2015-04-27 LAB — CBC WITH DIFFERENTIAL/PLATELET
BASOS ABS: 0 10*3/uL (ref 0–0.1)
BASOS PCT: 2 %
EOS ABS: 0.1 10*3/uL (ref 0–0.7)
Eosinophils Relative: 3 %
HEMATOCRIT: 39.3 % (ref 35.0–47.0)
Hemoglobin: 12.8 g/dL (ref 12.0–16.0)
Lymphocytes Relative: 19 %
Lymphs Abs: 0.5 10*3/uL — ABNORMAL LOW (ref 1.0–3.6)
MCH: 30.7 pg (ref 26.0–34.0)
MCHC: 32.5 g/dL (ref 32.0–36.0)
MCV: 94.6 fL (ref 80.0–100.0)
MONO ABS: 0.3 10*3/uL (ref 0.2–0.9)
Monocytes Relative: 13 %
NEUTROS ABS: 1.6 10*3/uL (ref 1.4–6.5)
NEUTROS PCT: 63 %
Platelets: 256 10*3/uL (ref 150–440)
RBC: 4.15 MIL/uL (ref 3.80–5.20)
RDW: 14.7 % — AB (ref 11.5–14.5)
WBC: 2.5 10*3/uL — ABNORMAL LOW (ref 3.6–11.0)

## 2015-04-27 MED ORDER — POMALYST 4 MG PO CAPS: 4.0000 mg | ORAL_CAPSULE | Freq: Every day | ORAL | Status: DC

## 2015-04-28 LAB — MULTIPLE MYELOMA PANEL, SERUM
ALPHA2 GLOB SERPL ELPH-MCNC: 0.7 g/dL (ref 0.4–1.0)
Albumin SerPl Elph-Mcnc: 3.4 g/dL (ref 2.9–4.4)
Albumin/Glob SerPl: 1.4 (ref 0.7–1.7)
Alpha 1: 0.3 g/dL (ref 0.0–0.4)
B-Globulin SerPl Elph-Mcnc: 0.9 g/dL (ref 0.7–1.3)
GLOBULIN, TOTAL: 2.6 g/dL (ref 2.2–3.9)
Gamma Glob SerPl Elph-Mcnc: 0.8 g/dL (ref 0.4–1.8)
IGA: 52 mg/dL — AB (ref 64–422)
IGM, SERUM: 69 mg/dL (ref 26–217)
IgG (Immunoglobin G), Serum: 755 mg/dL (ref 700–1600)
M Protein SerPl Elph-Mcnc: 0.5 g/dL — ABNORMAL HIGH
Total Protein ELP: 6 g/dL (ref 6.0–8.5)

## 2015-04-28 LAB — KAPPA/LAMBDA LIGHT CHAINS
KAPPA FREE LGHT CHN: 16.63 mg/L (ref 3.30–19.40)
KAPPA, LAMDA LIGHT CHAIN RATIO: 1.54 (ref 0.26–1.65)
LAMDA FREE LIGHT CHAINS: 10.8 mg/L (ref 5.71–26.30)

## 2015-04-28 LAB — PROTEIN ELECTROPHORESIS, SERUM
A/G Ratio: 1.4 (ref 0.7–1.7)
ALPHA-2-GLOBULIN: 0.6 g/dL (ref 0.4–1.0)
Albumin ELP: 3.5 g/dL (ref 2.9–4.4)
Alpha-1-Globulin: 0.3 g/dL (ref 0.0–0.4)
Beta Globulin: 0.9 g/dL (ref 0.7–1.3)
GLOBULIN, TOTAL: 2.5 g/dL (ref 2.2–3.9)
Gamma Globulin: 0.7 g/dL (ref 0.4–1.8)
M-SPIKE, %: 0.5 g/dL — AB
Total Protein ELP: 6 g/dL (ref 6.0–8.5)

## 2015-05-01 ENCOUNTER — Encounter: Payer: Self-pay | Admitting: Oncology

## 2015-05-01 NOTE — Progress Notes (Signed)
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  INTERVAL HISTORY:  81 year old lady came today further follow-up for assessment of multiple myeloma. Patient also has nonhealing ulcers in both lower extremity being evaluated by wound care.  Center According to her wound is healing well particularly on the left side.  Right side is also feeling better No nausea no vomiting or diarrhea appetite has been improving.  No chills.  No fever. Patient is here for ongoing evaluation and continuation off pomalidomide.  Tolerating treatment very well.  Patient has now reduced dose of 2 weeks on and 2 weeks off. Ulceration in both lower extremity is healed.  Patient's ambulation is gradually improving   REVIEW OF SYSTEMS:   Gen. status: Performance status is 2.  Patient is feeling better.  Appetite is improving.  HEENT: Patient has osteonecrosis of jaw bone.  Swelling on the left side of the neck is improved.  Lungs: Shortness of breath on exertion.  Cardiac: No chest pain no palpitation.  GI: Appetite is getting better no nausea no vomiting no diarrhea or rectal bleeding.  GU: No dysuria or hematuria.  Neurological system: Continues to no problem with maintaining balance.  No tingling.  No numbness.  Lower extremity no swellings.  Skin: No rash.  No skeletal system joint pains continue to bother patient perstatus is improving. Swelling in the left jaw has been All other systems have been reviewed O significant family history  As per HPI. Otherwise, a complete review of systems is negatve.  PAST MEDICAL  HISTORY: Past Medical History  Diagnosis Date  . 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 Anxiety   . Coronary arteriosclerosis   . Hypertension   . Hyperlipidemia   . Arthritis   . Atrial fibrillation ([place])   . Multiple myeloma ([place])   . Multiple myeloma in relapse ([place]) 10/29/2014  . History of blood transfusion 2014    PAST SURGICAL HISTORY: Past Surgical History  Procedure Laterality Date  . Appendectomy    . Abdominal hysterectomy    . Total knee revision Left   . 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 Patient is postmenopausal.     ADVANCED DIRECTIVES:   Patient does have advance healthcare directive, Patient   does not desire to make any changes Patient did bring power of attorney and living will which has been copied and scanned in the chart HEALTH MAINTENANCE: Social History  Substance Use Topics  . Smoking status: Never Smoker   . Smokeless tobacco: Never Used  . Alcohol Use: 1.2 oz/week    2 Glasses of wine per week  Allergies  Allergen Reactions  . Biaxin [Clarithromycin] Diarrhea and Nausea And Vomiting  . Demerol [Meperidine] Nausea And Vomiting  . Hydrocodone Nausea And Vomiting  . Sulfa Antibiotics Hives  . Tramadol Diarrhea and Nausea And Vomiting  . Ultracet [Tramadol-Acetaminophen] Other (See Comments)    Current Outpatient Prescriptions  Medication Sig Dispense Refill  . Biotin 1 MG CAPS Take by mouth.    . Dexamethasone (DECADRON PO) Take by mouth as directed. With pomalyst    . ibuprofen (ADVIL,MOTRIN)  800 MG tablet Take 1 tablet (800 mg total) by mouth every 8 (eight) hours as needed for moderate pain. 30 tablet 3  . POMALYST 4 MG capsule Take 1 capsule (4 mg total) by mouth daily. For 2 weeks then 2 weeks off. 14 capsule 3   No current facility-administered medications for this visit.   Facility-Administered Medications Ordered in Other Visits  Medication Dose Route Frequency Provider Last Rate Last Dose  . sodium chloride 0.9 % injection 10 mL  10 mL Intravenous PRN Forest Gleason, MD   10 mL at 03/01/15 1305    OBJECTIVE:  Filed Vitals:   04/27/15 1147  BP: 156/79  Pulse: 63  Temp: 96.7 F (35.9 C)     Body mass index is 19.03 kg/(m^2).    ECOG FS: 2  PHYSICAL EXAM: GENERAL  status: Performance status is 2  Patient has not lost significant weight Walking with the help of walker. HEENT: No evidence of stomatitis.  No swelling on the left side of the neck.  Osteonecrosis of jaw bone is stable.  No evidence of infection.   Sclera and conjunctivae :: No jaundice.   pale looking . Lungs: Air  entry equal on both sides.  No rhonchi.  No rales.  Cardiac: Heart sounds are normal.  No pericardial rub.  No murmur. Lymphatic system: Cervical, axillary, inguinal, lymph nodes not palpable GI: Abdomen is soft.  No ascites.  Liver spleen not palpable.  No tenderness.  Bowel sounds are within normal limit Lower extremity:Patient has ulceration which is traumatic but gradually getting worse.  Redness around it  Neurologically, the patient was awake, alert, and oriented to person, place and time. There were no obvious focal neurologic abnormalities.ct No evidence of peripheral neuropathy. Examination of the skin revealed no evidence of significant rashes, suspicious appearing nevi or other concerning lesions.       Diagnosis Skin , right arm INVASIVE SQUAMOUS CELL CARCINOMA (KERATOACANTHOMA TYPE). MARGINS OF RESECTION ARE NEGATIVE. Casimer Lanius MD Pathologist, Electronic  Signature (Case signed 01/20/2015)  STUDIES:             Ref Range 1d ago  56moago  238mogo     Total Protein ELP 6.0 - 8.5 g/dL 6.0 6.3 6.3    Albumin ELP 2.9 - 4.4 g/dL 3.4 3.5 3.6    Alpha-1-Globulin 0.0 - 0.4 g/dL 0.2 0.2 0.2    Alpha-2-Globulin 0.4 - 1.0 g/dL 0.7 0.6 0.6    Beta Globulin 0.7 - 1.3 g/dL 1.0 1.0 1.0    Gamma Globulin 0.4 - 1.8 g/dL 0.7 0.9 0.9    M-Spike, % Not Observed g/dL 0.5 (H) 0.6 0.6         Kappa/lambda light chains (Order [id number]     Kappa/lambda light chains  Status: Finalresult Visible to patient:  Not Released Nextappt: 03/01/2015 at 11:00 AM in Oncology ([facility]) Dx:  Multiple myeloma in relapse  Ref Range 1d ago  48moago  284mogo  57m30moo     Kappa free light chain 3.30 - 19.40 mg/L 16.25 16.04 18.31 14.97    Lamda free light chains 5.71 - 26.30 mg/L 9.68 11.21 11.72 11.56    Kappa, lamda light chain ratio 0.26 - 1.65  1.68 (H) 1.43CM 1.56CM 1.29CM         ASSESSMENT: Multiple myeloma in relaps  Stable disease with stable M spike.  On pomolidomide [place] C cup this cycle.  Patient was advised to not to start new cycle until also seen of both lower extremity completely heals Ulcers in both lower extremity has healed.  One clinic note has been appreciated has been reviewed. 3.  Osteonecrosis of jaw bone stable 4.  Bilateral traumatic ulcer which were nonhealing.  Patient is taking help of bone care physician and there is significant change in the ulcers left extremity is healing well.  Right extremity is also doing better Will continue   pomolidomide  2 weeks on and 2 weeks off Reevaluation with SIEP sows stable M spike Discussed that in detail with the patient.  All lab data has been reviewed.   Reevaluation in 4 weeks or before if needed      No matching staging information was found for the patient.  JanForest GleasonD   05/01/2015 11:41 AM

## 2015-05-08 ENCOUNTER — Telehealth: Payer: Self-pay | Admitting: *Deleted

## 2015-05-08 NOTE — Telephone Encounter (Signed)
Asking Dr Oliva Bustard call him to discuss long term care vs hospice

## 2015-05-15 DIAGNOSIS — Z961 Presence of intraocular lens: Secondary | ICD-10-CM | POA: Diagnosis not present

## 2015-05-18 ENCOUNTER — Other Ambulatory Visit: Payer: Self-pay | Admitting: *Deleted

## 2015-05-18 MED ORDER — DEXAMETHASONE 4 MG PO TABS: 20.0000 mg | ORAL_TABLET | Freq: Every day | ORAL | Status: DC

## 2015-05-18 NOTE — Telephone Encounter (Signed)
Decadron escribed to local pharmacy.

## 2015-05-18 NOTE — Telephone Encounter (Signed)
Called to state that she received Pomalyst, but did not get the 3 steroid she usually gets this time. Asking for it to be called in.

## 2015-05-18 NOTE — Telephone Encounter (Signed)
Per Hildred Alamin, will send to local pharmacy. Pt informed

## 2015-05-19 ENCOUNTER — Telehealth: Payer: Self-pay | Admitting: *Deleted

## 2015-05-19 MED ORDER — DEXAMETHASONE 4 MG PO TABS: 20.0000 mg | ORAL_TABLET | Freq: Every day | ORAL | Status: DC

## 2015-05-19 NOTE — Telephone Encounter (Signed)
Pt had questions regarding decadron dosing and if prescription was correct that was sent to pharmacy. Informed pt that the prescription was correct and instructed pt that needs to take 5 tablets once a week for 3 weeks instead of just 1 tablet. Pt verbalized understanding.

## 2015-05-25 ENCOUNTER — Inpatient Hospital Stay (HOSPITAL_BASED_OUTPATIENT_CLINIC_OR_DEPARTMENT_OTHER): Payer: Medicare Other | Admitting: Oncology

## 2015-05-25 ENCOUNTER — Encounter: Payer: Self-pay | Admitting: Oncology

## 2015-05-25 ENCOUNTER — Inpatient Hospital Stay: Payer: Medicare Other | Attending: Oncology

## 2015-05-25 VITALS — BP 155/86 | HR 77 | Temp 99.1°F | Wt 103.0 lb

## 2015-05-25 DIAGNOSIS — I129 Hypertensive chronic kidney disease with stage 1 through stage 4 chronic kidney disease, or unspecified chronic kidney disease: Secondary | ICD-10-CM

## 2015-05-25 DIAGNOSIS — R0600 Dyspnea, unspecified: Secondary | ICD-10-CM

## 2015-05-25 DIAGNOSIS — J209 Acute bronchitis, unspecified: Secondary | ICD-10-CM | POA: Insufficient documentation

## 2015-05-25 DIAGNOSIS — F419 Anxiety disorder, unspecified: Secondary | ICD-10-CM | POA: Insufficient documentation

## 2015-05-25 DIAGNOSIS — D759 Disease of blood and blood-forming organs, unspecified: Secondary | ICD-10-CM

## 2015-05-25 DIAGNOSIS — M87180 Osteonecrosis due to drugs, jaw: Secondary | ICD-10-CM | POA: Diagnosis not present

## 2015-05-25 DIAGNOSIS — I4891 Unspecified atrial fibrillation: Secondary | ICD-10-CM | POA: Diagnosis not present

## 2015-05-25 DIAGNOSIS — M1990 Unspecified osteoarthritis, unspecified site: Secondary | ICD-10-CM | POA: Diagnosis not present

## 2015-05-25 DIAGNOSIS — Z79899 Other long term (current) drug therapy: Secondary | ICD-10-CM | POA: Insufficient documentation

## 2015-05-25 DIAGNOSIS — E785 Hyperlipidemia, unspecified: Secondary | ICD-10-CM

## 2015-05-25 DIAGNOSIS — T50995S Adverse effect of other drugs, medicaments and biological substances, sequela: Secondary | ICD-10-CM | POA: Insufficient documentation

## 2015-05-25 DIAGNOSIS — I10 Essential (primary) hypertension: Secondary | ICD-10-CM

## 2015-05-25 DIAGNOSIS — C9002 Multiple myeloma in relapse: Secondary | ICD-10-CM | POA: Insufficient documentation

## 2015-05-25 DIAGNOSIS — Z7952 Long term (current) use of systemic steroids: Secondary | ICD-10-CM

## 2015-05-25 LAB — COMPREHENSIVE METABOLIC PANEL
ALT: 14 U/L (ref 14–54)
ANION GAP: 9 (ref 5–15)
AST: 20 U/L (ref 15–41)
Albumin: 3.7 g/dL (ref 3.5–5.0)
Alkaline Phosphatase: 81 U/L (ref 38–126)
BILIRUBIN TOTAL: 0.5 mg/dL (ref 0.3–1.2)
BUN: 16 mg/dL (ref 6–20)
CO2: 26 mmol/L (ref 22–32)
Calcium: 8.7 mg/dL — ABNORMAL LOW (ref 8.9–10.3)
Chloride: 100 mmol/L — ABNORMAL LOW (ref 101–111)
Creatinine, Ser: 0.7 mg/dL (ref 0.44–1.00)
GFR calc Af Amer: >60 mL/min (ref >60)
Glucose, Bld: 87 mg/dL (ref 65–99)
POTASSIUM: 3.6 mmol/L (ref 3.5–5.1)
Sodium: 135 mmol/L (ref 135–145)
TOTAL PROTEIN: 6.6 g/dL (ref 6.5–8.1)

## 2015-05-25 LAB — CBC WITH DIFFERENTIAL/PLATELET
Basophils Absolute: 0.1 10*3/uL (ref 0–0.1)
Basophils Relative: 2 %
Eosinophils Absolute: 0 10*3/uL (ref 0–0.7)
Eosinophils Relative: 1 %
HEMATOCRIT: 40.6 % (ref 35.0–47.0)
Hemoglobin: 13.3 g/dL (ref 12.0–16.0)
LYMPHS PCT: 9 %
Lymphs Abs: 0.3 10*3/uL — ABNORMAL LOW (ref 1.0–3.6)
MCH: 30.6 pg (ref 26.0–34.0)
MCHC: 32.7 g/dL (ref 32.0–36.0)
MCV: 93.5 fL (ref 80.0–100.0)
MONO ABS: 1.2 10*3/uL — AB (ref 0.2–0.9)
MONOS PCT: 31 %
NEUTROS ABS: 2.3 10*3/uL (ref 1.4–6.5)
Neutrophils Relative %: 57 %
Platelets: 202 10*3/uL (ref 150–440)
RBC: 4.34 MIL/uL (ref 3.80–5.20)
RDW: 14.1 % (ref 11.5–14.5)
WBC: 3.9 10*3/uL (ref 3.6–11.0)

## 2015-05-25 MED ORDER — GUAIFENESIN ER 600 MG PO TB12: 600.0000 mg | ORAL_TABLET | Freq: Two times a day (BID) | ORAL | Status: DC

## 2015-05-25 MED ORDER — AZITHROMYCIN 500 MG PO TABS: 500.0000 mg | ORAL_TABLET | Freq: Every day | ORAL | Status: DC

## 2015-05-25 MED ORDER — DEXTROMETHORPHAN-GUAIFENESIN 10-100 MG/5ML PO LIQD: 5.0000 mL | Freq: Four times a day (QID) | ORAL | Status: DC | PRN

## 2015-05-25 MED ORDER — HYDROCOD POLST-CPM POLST ER 10-8 MG/5ML PO SUER: 5.0000 mL | Freq: Two times a day (BID) | ORAL | Status: DC | PRN

## 2015-05-25 NOTE — Progress Notes (Signed)
Patient c/o cough that is keeping her awake at night.  Temp 99.1

## 2015-05-26 LAB — KAPPA/LAMBDA LIGHT CHAINS
KAPPA FREE LGHT CHN: 16.95 mg/L (ref 3.30–19.40)
Kappa, lambda light chain ratio: 1.25 (ref 0.26–1.65)
Lambda free light chains: 13.59 mg/L (ref 5.71–26.30)

## 2015-05-27 ENCOUNTER — Encounter: Payer: Self-pay | Admitting: Oncology

## 2015-05-27 NOTE — Progress Notes (Signed)
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  INTERVAL HISTORY:  81 year old lady came today further follow-up for assessment of multiple myeloma. Patient also has nonhealing ulcers in both lower extremity being evaluated by wound care.  Center According to her wound is healing well particularly on the left side.  Right side is also feeling better No nausea no vomiting or diarrhea appetite has been improving.  No chills.  No fever. Patient is here for ongoing evaluation and continuation off pomalidomide.  Tolerating treatment very well.  Patient has now reduced dose of 2 weeks on and 2 weeks off. Ulceration in both lower extremity is healed.  Patient's ambulation is gradually improving Patient is here for further follow-up regarding multiple myeloma.. Patient is taking pomalidomide 2 weeks on 2 weeks off  Continues to be myelosuppressive breast. Complains of cough and yellowish expectoration and low-grade fever   REVIEW OF SYSTEMS:   Gen. status: Performance status is 2.  Patient is feeling better.  Appetite is improving.  HEENT: Patient has osteonecrosis of jaw bone.  Swelling on the left side of the neck is improved.  Lungs: Shortness of breath on exertion.  Cardiac: No chest pain no palpitation.  GI: Appetite is getting better no nausea no vomiting no diarrhea or rectal bleeding.  GU: No dysuria or hematuria.  Neurological system: Continues to no problem with maintaining balance.  No tingling.  No numbness.  Lower extremity no swellings.  Skin: No rash.  No skeletal system joint pains  continue to bother patient perstatus is improving. Swelling in the left jaw has been All other systems have been reviewed O significant family history  As per HPI. Otherwise, a complete review of systems is negatve.  PAST MEDICAL HISTORY: Past Medical History  Diagnosis Date  . Osteonecrosis of jaw ([place])     left  . Anxiety   . Coronary arteriosclerosis   . Hypertension   . Hyperlipidemia   . Arthritis   . Atrial fibrillation ([place])   . Multiple myeloma ([place])   . Multiple myeloma in relapse ([place]) 10/29/2014  . History of blood transfusion 2014    PAST SURGICAL HISTORY: Past Surgical History  Procedure Laterality Date  . Appendectomy    . Abdominal hysterectomy    . Total knee revision Left   . 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 Patient is postmenopausal.     ADVANCED DIRECTIVES:   Patient does have advance healthcare directive, Patient   does not desire to make any changes Patient did bring power of attorney and living will which has been copied and scanned in the chart HEALTH MAINTENANCE: Social History  Substance Use Topics  . Smoking status: Never Smoker   . Smokeless tobacco: Never Used  . Alcohol Use: 1.2 oz/week    2 Glasses of wine per week      Allergies  Allergen Reactions  . Biaxin [Clarithromycin] Diarrhea and Nausea And Vomiting  . Demerol [Meperidine] Nausea And Vomiting  . Hydrocodone Nausea And Vomiting  . Sulfa Antibiotics Hives  . Tramadol Diarrhea and Nausea And Vomiting  . Ultracet [Tramadol-Acetaminophen]  Other (See Comments)    Current Outpatient Prescriptions  Medication Sig Dispense Refill  . Biotin 1 MG CAPS Take by mouth.    . dexamethasone (DECADRON) 4 MG tablet Take 5 tablets (20 mg total) by mouth daily. Take on day 1, day 8, and day 15. 15 tablet 0  . ibuprofen (ADVIL,MOTRIN) 800 MG tablet Take 1 tablet (800 mg total) by mouth every 8 (eight) hours as needed for moderate pain. 30 tablet 3  . POMALYST 4 MG capsule Take 1 capsule (4 mg total) by mouth daily. For 2 weeks then 2 weeks off. 14 capsule 3  . azithromycin (ZITHROMAX) 500 MG tablet Take 1 tablet (500 mg total) by mouth daily. 5 tablet 0  . dextromethorphan-guaiFENesin (TUSSIN DM) 10-100 MG/5ML liquid Take 5 mLs by mouth every 6 (six) hours as needed for cough. 180 mL 0  . guaiFENesin (MUCINEX) 600 MG 12 hr tablet Take 1 tablet (600 mg total) by mouth 2 (two) times daily. 20 tablet 0   No current facility-administered medications for this visit.   Facility-Administered Medications Ordered in Other Visits  Medication Dose Route Frequency Provider Last Rate Last Dose  . sodium chloride 0.9 % injection 10 mL  10 mL Intravenous PRN Forest Gleason, MD   10 mL at 03/01/15 1305    OBJECTIVE:  Filed Vitals:   05/25/15 1216  BP: 155/86  Pulse: 77  Temp: 99.1 F (37.3 C)     Body mass index is 18.83 kg/(m^2).    ECOG FS: 2  PHYSICAL EXAM: GENERAL  status: Performance status is 2  Patient has not lost significant weight Walking with the help of walker. HEENT: No evidence of stomatitis.  No swelling on the left side of the neck.  Osteonecrosis of jaw bone is stable.  No evidence of infection.   Sclera and conjunctivae :: No jaundice.   pale looking . Lungs: Air  entry equal on both sides.  ocasional   rhonchi.  No rales.  . Cardiac: Heart sounds are normal.  No pericardial rub.  No murmur. Lymphatic system: Cervical, axillary, inguinal, lymph nodes not palpable GI: Abdomen is soft.  No ascites.  Liver spleen not palpable.  No  tenderness.  Bowel sounds are within normal limit Lower extremity:Patient has ulceration which is traumatic but gradually getting worse.  Redness around it  Neurologically, the patient was awake, alert, and oriented to person, place and time. There were no obvious focal neurologic abnormalities.ct No evidence of peripheral neuropathy. Examination of the skin revealed no evidence of significant rashes, suspicious appearing nevi or other concerning lesions.     Ref Range 2d ago  55moago  230mogo  67m46moo      Kappa free light chain 3.30 - 19.40 mg/L 16.95 16.63 14.23 16.25    Lamda free light chains 5.71 - 26.30 mg/L 13.59 10.80 9.90 9.68    Kappa, lamda light chain ratio 0.26 - 1.65  1.25           CBC Latest Ref  Rng 05/25/2015 04/27/2015 03/29/2015  WBC 3.6 - 11.0 K/uL 3.9 2.5(L) 4.3  Hemoglobin 12.0 - 16.0 g/dL 13.3 12.8 12.8  Hematocrit 35.0 - 47.0 % 40.6 39.3 38.3  Platelets 150 - 440 K/uL 202 256 231                 ASSESSMENT: Multiple myeloma in relapse. On pomalidomide and steroid Clinically  patient is responding to the treatment All lab data has been reviewed Stable disease with stable M spike.  On pomolidomide Finney C cup this cycle.  Patient was advised to not to start new cycle until also seen of both lower extremity completely heals Ulcers in both lower extremity has healed.  One clinic note has been appreciated has been reviewed.  3.  Osteonecrosis of jaw bone stable 4.  Acute bronchitis.  Will start patient on Robitussin-DM.  Humibid L.A.  .  Zithromax 500 mg for 5 days Patient was advised to call me if there is no significant improvement in next few days  Reevaluation in 4 weeks or before if needed      No matching staging information was found for the patient.  Forest Gleason, MD   05/27/2015 7:26 AM

## 2015-05-29 LAB — MULTIPLE MYELOMA PANEL, SERUM
ALPHA2 GLOB SERPL ELPH-MCNC: 0.7 g/dL (ref 0.4–1.0)
Albumin SerPl Elph-Mcnc: 3.2 g/dL (ref 2.9–4.4)
Albumin/Glob SerPl: 1.3 (ref 0.7–1.7)
Alpha 1: 0.2 g/dL (ref 0.0–0.4)
B-Globulin SerPl Elph-Mcnc: 0.9 g/dL (ref 0.7–1.3)
GAMMA GLOB SERPL ELPH-MCNC: 0.7 g/dL (ref 0.4–1.8)
Globulin, Total: 2.5 g/dL (ref 2.2–3.9)
IGG (IMMUNOGLOBIN G), SERUM: 671 mg/dL — AB (ref 700–1600)
IgA: 52 mg/dL — ABNORMAL LOW (ref 64–422)
IgM, Serum: 67 mg/dL (ref 26–217)
M PROTEIN SERPL ELPH-MCNC: 0.5 g/dL — AB
TOTAL PROTEIN ELP: 5.7 g/dL — AB (ref 6.0–8.5)

## 2015-05-29 LAB — PROTEIN ELECTROPHORESIS, SERUM
A/G Ratio: 1.3 (ref 0.7–1.7)
Albumin ELP: 3.2 g/dL (ref 2.9–4.4)
Alpha-1-Globulin: 0.3 g/dL (ref 0.0–0.4)
Alpha-2-Globulin: 0.7 g/dL (ref 0.4–1.0)
Beta Globulin: 0.9 g/dL (ref 0.7–1.3)
GAMMA GLOBULIN: 0.7 g/dL (ref 0.4–1.8)
GLOBULIN, TOTAL: 2.5 g/dL (ref 2.2–3.9)
M-SPIKE, %: 0.4 g/dL — AB
TOTAL PROTEIN ELP: 5.7 g/dL — AB (ref 6.0–8.5)

## 2015-06-13 ENCOUNTER — Telehealth: Payer: Self-pay | Admitting: *Deleted

## 2015-06-13 NOTE — Telephone Encounter (Signed)
Asking to be seen tomorrow or Thursday. Offered appt tomorrow at 8:15 or 3:30, she will have to check with her transportation and call me back She states she is coughing up yellow form her sinuses. And does not not know if she is running fever, she has not checked

## 2015-06-13 NOTE — Telephone Encounter (Signed)
Called back to accept appt tomorrow at 330

## 2015-06-14 ENCOUNTER — Inpatient Hospital Stay: Payer: Medicare Other

## 2015-06-14 ENCOUNTER — Inpatient Hospital Stay: Payer: Medicare Other | Attending: Oncology | Admitting: Oncology

## 2015-06-14 VITALS — BP 139/78 | HR 84 | Temp 96.5°F | Resp 18 | Wt 103.3 lb

## 2015-06-14 DIAGNOSIS — M1990 Unspecified osteoarthritis, unspecified site: Secondary | ICD-10-CM | POA: Insufficient documentation

## 2015-06-14 DIAGNOSIS — J209 Acute bronchitis, unspecified: Secondary | ICD-10-CM | POA: Insufficient documentation

## 2015-06-14 DIAGNOSIS — I2510 Atherosclerotic heart disease of native coronary artery without angina pectoris: Secondary | ICD-10-CM | POA: Diagnosis not present

## 2015-06-14 DIAGNOSIS — L97919 Non-pressure chronic ulcer of unspecified part of right lower leg with unspecified severity: Secondary | ICD-10-CM | POA: Diagnosis not present

## 2015-06-14 DIAGNOSIS — C9002 Multiple myeloma in relapse: Secondary | ICD-10-CM | POA: Diagnosis not present

## 2015-06-14 DIAGNOSIS — Z79899 Other long term (current) drug therapy: Secondary | ICD-10-CM | POA: Diagnosis not present

## 2015-06-14 DIAGNOSIS — Z7952 Long term (current) use of systemic steroids: Secondary | ICD-10-CM | POA: Diagnosis not present

## 2015-06-14 DIAGNOSIS — M87180 Osteonecrosis due to drugs, jaw: Secondary | ICD-10-CM | POA: Insufficient documentation

## 2015-06-14 DIAGNOSIS — I10 Essential (primary) hypertension: Secondary | ICD-10-CM | POA: Insufficient documentation

## 2015-06-14 DIAGNOSIS — J069 Acute upper respiratory infection, unspecified: Secondary | ICD-10-CM | POA: Insufficient documentation

## 2015-06-14 DIAGNOSIS — I4891 Unspecified atrial fibrillation: Secondary | ICD-10-CM | POA: Diagnosis not present

## 2015-06-14 DIAGNOSIS — Z780 Asymptomatic menopausal state: Secondary | ICD-10-CM | POA: Insufficient documentation

## 2015-06-14 DIAGNOSIS — L97929 Non-pressure chronic ulcer of unspecified part of left lower leg with unspecified severity: Secondary | ICD-10-CM | POA: Insufficient documentation

## 2015-06-14 DIAGNOSIS — E785 Hyperlipidemia, unspecified: Secondary | ICD-10-CM | POA: Insufficient documentation

## 2015-06-14 DIAGNOSIS — R2689 Other abnormalities of gait and mobility: Secondary | ICD-10-CM | POA: Insufficient documentation

## 2015-06-14 DIAGNOSIS — D759 Disease of blood and blood-forming organs, unspecified: Secondary | ICD-10-CM | POA: Insufficient documentation

## 2015-06-14 DIAGNOSIS — Z792 Long term (current) use of antibiotics: Secondary | ICD-10-CM | POA: Diagnosis not present

## 2015-06-14 DIAGNOSIS — Z972 Presence of dental prosthetic device (complete) (partial): Secondary | ICD-10-CM

## 2015-06-14 DIAGNOSIS — T50995S Adverse effect of other drugs, medicaments and biological substances, sequela: Secondary | ICD-10-CM | POA: Diagnosis not present

## 2015-06-14 LAB — CBC WITH DIFFERENTIAL/PLATELET
BASOS ABS: 0.1 10*3/uL (ref 0–0.1)
Basophils Relative: 3 %
EOS ABS: 0.1 10*3/uL (ref 0–0.7)
EOS PCT: 2 %
HCT: 39 % (ref 35.0–47.0)
Hemoglobin: 13 g/dL (ref 12.0–16.0)
LYMPHS PCT: 13 %
Lymphs Abs: 0.5 10*3/uL — ABNORMAL LOW (ref 1.0–3.6)
MCH: 31.3 pg (ref 26.0–34.0)
MCHC: 33.4 g/dL (ref 32.0–36.0)
MCV: 93.7 fL (ref 80.0–100.0)
Monocytes Absolute: 0.3 10*3/uL (ref 0.2–0.9)
Monocytes Relative: 8 %
Neutro Abs: 2.7 10*3/uL (ref 1.4–6.5)
Neutrophils Relative %: 74 %
PLATELETS: 215 10*3/uL (ref 150–440)
RBC: 4.16 MIL/uL (ref 3.80–5.20)
RDW: 15.5 % — ABNORMAL HIGH (ref 11.5–14.5)
WBC: 3.6 10*3/uL (ref 3.6–11.0)

## 2015-06-14 MED ORDER — AMOXICILLIN-POT CLAVULANATE 875-125 MG PO TABS: 1.0000 | ORAL_TABLET | Freq: Two times a day (BID) | ORAL | Status: DC

## 2015-06-14 NOTE — Progress Notes (Signed)
Patient here for cough and congestion.  States she just finished a Z-Pak but is not any better.

## 2015-06-15 ENCOUNTER — Encounter: Payer: Self-pay | Admitting: Oncology

## 2015-06-15 NOTE — Progress Notes (Signed)
[place] @ [facility] Telephone:(336) [phone]  Fax:(336) [name] OB: 26-Jul-1924  MR#: [id number]  YTK#:[id number]  Patient Care Team: Derinda Late, MD as PCP - General (Family Medicine) Forest Gleason, MD (Oncology) Seeplaputhur Robinette Haines, MD (General Surgery)  CHIEF COMPLAINT:  Multiple myeloma on  POMOLIDOMIDE Osteonecrosis of jaw bone pain secondary to biphosphonate therapy with Zometa     No flowsheet data found.  INTERVAL HISTORY:  82 year old lady came today further follow-up for assessment of multiple myeloma. Patient also has nonhealing ulcers in both lower extremity being evaluated by wound care.  Center Patient came as an acute add-on complaining of cough yellowish expectoration and low-grade fever sinus congestion.  Started few days ago. Patient is presently taking pomalidomide or recurrent multiple myeloma   REVIEW OF SYSTEMS:   Gen. status: Performance status is 2.  Patient is feeling better.  Appetite is improving.  HEENT: Patient has osteonecrosis of jaw bone.  Complains of sinus congestion.  Sinus drainage. Lungs: Cough yellowish expectoration low-grade fever.  Cardiac: No chest pain .  GI: Appetite is getting better no nausea no vomiting no diarrhea or rectal bleeding.  GU: No dysuria or hematuria.  Neurological system: Continues to no problem with maintaining balance.  No tingling.  No numbness.  Lower extremity no swellings.  Skin: No rash.  No skeletal system joint pains continue to bother patient perstatus is improving. Swelling in the left jaw has been All other systems have been reviewed O significant family history  As per HPI. Otherwise, a complete review of systems is negatve.  PAST MEDICAL HISTORY: Past Medical History  Diagnosis Date  . Osteonecrosis of jaw ([place])     left  . Anxiety   . Coronary arteriosclerosis   . Hypertension   . Hyperlipidemia   . Arthritis   . Atrial fibrillation ([place])   . Multiple myeloma ([place])     . Multiple myeloma in relapse ([place]) 10/29/2014  . History of blood transfusion 2014    PAST SURGICAL HISTORY: Past Surgical History  Procedure Laterality Date  . Appendectomy    . Abdominal hysterectomy    . Total knee revision Left   . 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 Patient is postmenopausal.     ADVANCED DIRECTIVES:   Patient does have advance healthcare directive, Patient   does not desire to make any changes Patient did bring power of attorney and living will which has been copied and scanned in the chart HEALTH MAINTENANCE: Social History  Substance Use Topics  . Smoking status: Never Smoker   . Smokeless tobacco: Never Used  . Alcohol Use: 1.2 oz/week    2 Glasses of wine per week      Allergies  Allergen Reactions  . Biaxin [Clarithromycin] Diarrhea and Nausea And Vomiting  . Demerol [Meperidine] Nausea And Vomiting  . Hydrocodone Nausea And Vomiting  . Sulfa Antibiotics Hives  . Tramadol Diarrhea and Nausea And Vomiting  . Ultracet [Tramadol-Acetaminophen] Other (See Comments)  Current Outpatient Prescriptions  Medication Sig Dispense Refill  . Biotin 1 MG CAPS Take by mouth.    . dexamethasone (DECADRON) 4 MG tablet Take 5 tablets (20 mg total) by mouth daily. Take on day 1, day 8, and day 15. 15 tablet 0  . dextromethorphan-guaiFENesin (TUSSIN DM) 10-100 MG/5ML liquid Take 5 mLs by mouth every 6 (six) hours as needed for cough. 180 mL 0  . guaiFENesin (MUCINEX) 600 MG 12 hr tablet Take 1 tablet (600 mg total) by  mouth 2 (two) times daily. 20 tablet 0  . ibuprofen (ADVIL,MOTRIN) 800 MG tablet Take 1 tablet (800 mg total) by mouth every 8 (eight) hours as needed for moderate pain. 30 tablet 3  . POMALYST 4 MG capsule Take 1 capsule (4 mg total) by mouth daily. For 2 weeks then 2 weeks off. 14 capsule 3  . amoxicillin-clavulanate (AUGMENTIN) 875-125 MG tablet Take 1 tablet by mouth 2 (two) times daily. 14 tablet 0   No current facility-administered medications for this visit.   Facility-Administered Medications Ordered in Other Visits  Medication Dose Route Frequency Provider Last Rate Last Dose  . sodium chloride 0.9 % injection 10 mL  10 mL Intravenous PRN Forest Gleason, MD   10 mL at 03/01/15 1305    OBJECTIVE:  Filed Vitals:   06/14/15 1604  BP: 139/78  Pulse: 84  Temp: 96.5 F (35.8 C)  Resp: 18     Body mass index is 18.89 kg/(m^2).    ECOG FS: 2  PHYSICAL EXAM: GENERAL  status: Performance status is 2  Patient has not lost significant weight Walking with the help of walker. HEENT: No evidence of stomatitis.  No swelling on the left side of the neck.  Osteonecrosis of jaw bone is stable.  No evidence of infection. Redness in the throat  Sclera and conjunctivae :: No jaundice.   pale looking . Lungs: Occasional rhonchi rhonchi.. Cardiac: Heart sounds are normal.  No pericardial rub.  No murmur. Lymphatic system: Cervical, axillary, inguinal, lymph nodes not palpable GI: Abdomen is soft.  No ascites.  Liver spleen not palpable.  No tenderness.  Bowel sounds are within normal limit Lower extremity:Patient has ulceration which is traumatic but gradually getting worse.  Redness around it  Neurologically, the patient was awake, alert, and oriented to person, place and time. There were no obvious focal neurologic abnormalities.ct No evidence of peripheral neuropathy. Examination of the skin revealed no evidence of significant rashes, suspicious appearing nevi or other concerning  lesions.     Ref Range 2d ago  732moago  2732mogo  32m76moo      Kappa free light chain 3.30 - 19.40 mg/L 16.95 16.63 14.23 16.25    Lamda free light chains 5.71 - 26.30 mg/L 13.59 10.80 9.90 9.68    Kappa, lamda light chain ratio 0.26 - 1.65  1.25           CBC Latest Ref Rng 06/14/2015 05/25/2015 04/27/2015  WBC 3.6 - 11.0 K/uL 3.6 3.9 2.5(L)  Hemoglobin 12.0 - 16.0 g/dL 13.0 13.3 12.8  Hematocrit 35.0 - 47.0 % 39.0 40.6 39.3  Platelets 150 - 440 K/uL 215 202 256                 ASSESSMENT: Multiple myeloma in relapse. On pomalidomide and steroid Clinically  patient is responding to the treatment All lab data has been reviewed Stable disease with stable M spike.  On pomolidomide Finney C cup this cycle.  Patient was advised to not to start new cycle until also seen of both lower extremity completely heals Ulcers in both lower extremity has healed.  One clinic note has been appreciated has been reviewed.  3.  Osteonecrosis of jaw bone stable  Acute bronchitis and acute upper respiratory tract infection Patient does not have any myelosuppression at present time lab data has been reviewed start patient on Augmentin .  If not better patient is to get in touch with [place] a chest x-ray may be needed to rule out pneumonia.   Reevaluation in 4 weeks or before if needed      No matching staging information was found for the patient.  Forest Gleason, MD   06/15/2015 7:55 AM

## 2015-06-27 ENCOUNTER — Encounter: Payer: Self-pay | Admitting: Oncology

## 2015-06-27 ENCOUNTER — Inpatient Hospital Stay: Payer: Medicare Other

## 2015-06-27 ENCOUNTER — Inpatient Hospital Stay (HOSPITAL_BASED_OUTPATIENT_CLINIC_OR_DEPARTMENT_OTHER): Payer: Medicare Other | Admitting: Oncology

## 2015-06-27 VITALS — BP 154/79 | HR 71 | Temp 97.0°F | Wt 101.4 lb

## 2015-06-27 DIAGNOSIS — I10 Essential (primary) hypertension: Secondary | ICD-10-CM

## 2015-06-27 DIAGNOSIS — D759 Disease of blood and blood-forming organs, unspecified: Secondary | ICD-10-CM | POA: Diagnosis not present

## 2015-06-27 DIAGNOSIS — I2510 Atherosclerotic heart disease of native coronary artery without angina pectoris: Secondary | ICD-10-CM

## 2015-06-27 DIAGNOSIS — M87180 Osteonecrosis due to drugs, jaw: Secondary | ICD-10-CM | POA: Diagnosis not present

## 2015-06-27 DIAGNOSIS — C9002 Multiple myeloma in relapse: Secondary | ICD-10-CM | POA: Diagnosis not present

## 2015-06-27 DIAGNOSIS — Z79899 Other long term (current) drug therapy: Secondary | ICD-10-CM

## 2015-06-27 DIAGNOSIS — L97929 Non-pressure chronic ulcer of unspecified part of left lower leg with unspecified severity: Secondary | ICD-10-CM

## 2015-06-27 DIAGNOSIS — J069 Acute upper respiratory infection, unspecified: Secondary | ICD-10-CM | POA: Diagnosis not present

## 2015-06-27 DIAGNOSIS — I4891 Unspecified atrial fibrillation: Secondary | ICD-10-CM

## 2015-06-27 DIAGNOSIS — L97919 Non-pressure chronic ulcer of unspecified part of right lower leg with unspecified severity: Secondary | ICD-10-CM

## 2015-06-27 DIAGNOSIS — Z7952 Long term (current) use of systemic steroids: Secondary | ICD-10-CM

## 2015-06-27 DIAGNOSIS — J209 Acute bronchitis, unspecified: Secondary | ICD-10-CM | POA: Diagnosis not present

## 2015-06-27 DIAGNOSIS — T50995S Adverse effect of other drugs, medicaments and biological substances, sequela: Secondary | ICD-10-CM

## 2015-06-27 DIAGNOSIS — Z792 Long term (current) use of antibiotics: Secondary | ICD-10-CM | POA: Diagnosis not present

## 2015-06-27 DIAGNOSIS — E785 Hyperlipidemia, unspecified: Secondary | ICD-10-CM

## 2015-06-27 DIAGNOSIS — R2689 Other abnormalities of gait and mobility: Secondary | ICD-10-CM

## 2015-06-27 LAB — CBC WITH DIFFERENTIAL/PLATELET
Basophils Absolute: 0 10*3/uL (ref 0–0.1)
Basophils Relative: 2 %
EOS ABS: 0.1 10*3/uL (ref 0–0.7)
EOS PCT: 4 %
HCT: 40.5 % (ref 35.0–47.0)
Hemoglobin: 13.4 g/dL (ref 12.0–16.0)
LYMPHS ABS: 0.7 10*3/uL — AB (ref 1.0–3.6)
Lymphocytes Relative: 24 %
MCH: 30.1 pg (ref 26.0–34.0)
MCHC: 33.1 g/dL (ref 32.0–36.0)
MCV: 90.8 fL (ref 80.0–100.0)
MONO ABS: 0.8 10*3/uL (ref 0.2–0.9)
Monocytes Relative: 28 %
NEUTROS PCT: 42 %
Neutro Abs: 1.2 10*3/uL — ABNORMAL LOW (ref 1.4–6.5)
PLATELETS: 237 10*3/uL (ref 150–440)
RBC: 4.46 MIL/uL (ref 3.80–5.20)
RDW: 15.7 % — AB (ref 11.5–14.5)
WBC: 2.9 10*3/uL — AB (ref 3.6–11.0)

## 2015-06-27 LAB — COMPREHENSIVE METABOLIC PANEL
ALT: 15 U/L (ref 14–54)
ANION GAP: 6 (ref 5–15)
AST: 14 U/L — ABNORMAL LOW (ref 15–41)
Albumin: 3.4 g/dL — ABNORMAL LOW (ref 3.5–5.0)
Alkaline Phosphatase: 97 U/L (ref 38–126)
BUN: 22 mg/dL — ABNORMAL HIGH (ref 6–20)
CHLORIDE: 103 mmol/L (ref 101–111)
CO2: 24 mmol/L (ref 22–32)
CREATININE: 0.77 mg/dL (ref 0.44–1.00)
Calcium: 8.4 mg/dL — ABNORMAL LOW (ref 8.9–10.3)
Glucose, Bld: 80 mg/dL (ref 65–99)
POTASSIUM: 3.7 mmol/L (ref 3.5–5.1)
SODIUM: 133 mmol/L — AB (ref 135–145)
Total Bilirubin: 0.7 mg/dL (ref 0.3–1.2)
Total Protein: 6.6 g/dL (ref 6.5–8.1)

## 2015-06-27 NOTE — Progress Notes (Signed)
[place] @ [facility] Telephone:(336) [phone]  Fax:(336) [place] OB: [birth year]  MR#: [id number]  RAX#:[id number]  Patient Care Team: Derinda Late, MD as PCP - General (Family Medicine) Forest Gleason, MD (Oncology) Seeplaputhur Robinette Haines, MD (General Surgery)  CHIEF COMPLAINT:  Multiple myeloma on  POMOLIDOMIDE Osteonecrosis of jaw bone pain secondary to biphosphonate therapy with Zometa     No flowsheet data found.  INTERVAL HISTORY:  82 year old lady came today further follow-up for assessment of multiple myeloma. Patient also has nonhealing ulcers in both lower extremity being evaluated by wound care.  Center Patient came as an acute add-on complaining of cough yellowish expectoration and low-grade fever sinus congestion.  Started few days ago. Patient is presently taking pomalidomide or recurrent multiple myeloma Patient is feeling better.  Upper respiratory tract infection is resolved. Myelosuppression without fever CONTINUES  to have problems maintaining balance   REVIEW OF SYSTEMS:   Gen. status: Performance status is 2.  Patient is feeling better.  Appetite is improving.  HEENT: Patient has osteonecrosis of jaw bone.  Complains of sinus congestion.  Sinus drainage. Lungs: Cough yellowish expectoration low-grade fever.  Cardiac: No chest pain .  GI: Appetite is getting better no nausea no vomiting no diarrhea or rectal bleeding.  GU: No dysuria or hematuria.  Neurological system: Continues to no problem with maintaining balance.  No tingling.  No numbness.  Lower extremity no swellings.  Skin: No rash.  No skeletal system joint pains continue to bother patient perstatus is improving. Swelling in the left jaw has been All other systems have been reviewed O significant family history  As per HPI. Otherwise, a complete review of systems is negatve.  PAST MEDICAL HISTORY: Past Medical History  Diagnosis Date  . Osteonecrosis of jaw ([place])     left   . Anxiety   . Coronary arteriosclerosis   . Hypertension   . Hyperlipidemia   . Arthritis   . Atrial fibrillation ([place])   . Multiple myeloma ([name])   . Multiple myeloma in relapse ([place]) 10/29/2014  . History of blood transfusion 2014    PAST SURGICAL HISTORY: Past Surgical History  Procedure Laterality Date  . Appendectomy    . Abdominal hysterectomy    . Total knee revision Left   . 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 Patient is postmenopausal.     ADVANCED DIRECTIVES:   Patient does have advance healthcare directive, Patient   does not desire to make any changes Patient did bring power of attorney and living will which has been copied and scanned in the chart HEALTH MAINTENANCE: Social History  Substance Use Topics  . Smoking status: Never Smoker   . Smokeless tobacco: Never Used  . Alcohol Use: 1.2 oz/week    2 Glasses of wine per week      Allergies  Allergen Reactions  . Biaxin [Clarithromycin] Diarrhea and Nausea And Vomiting  . Demerol [Meperidine] Nausea And Vomiting  . Hydrocodone Nausea And Vomiting  .  Sulfa Antibiotics Hives  . Tramadol Diarrhea and Nausea And Vomiting  . Ultracet [Tramadol-Acetaminophen] Other (See Comments)    Current Outpatient Prescriptions  Medication Sig Dispense Refill  . amoxicillin-clavulanate (AUGMENTIN) 875-125 MG tablet Take 1 tablet by mouth 2 (two) times daily. 14 tablet 0  . Biotin 1 MG CAPS Take by mouth.    . dexamethasone (DECADRON) 4 MG tablet Take 5 tablets (20 mg total) by mouth  daily. Take on day 1, day 8, and day 15. 15 tablet 0  . dextromethorphan-guaiFENesin (TUSSIN DM) 10-100 MG/5ML liquid Take 5 mLs by mouth every 6 (six) hours as needed for cough. 180 mL 0  . guaiFENesin (MUCINEX) 600 MG 12 hr tablet Take 1 tablet (600 mg total) by mouth 2 (two) times daily. 20 tablet 0  . ibuprofen (ADVIL,MOTRIN) 800 MG tablet Take 1 tablet (800 mg total) by mouth every 8 (eight) hours as needed for moderate pain. 30 tablet 3  . POMALYST 4 MG capsule Take 1 capsule (4 mg total) by mouth daily. For 2 weeks then 2 weeks off. 14 capsule 3   No current facility-administered medications for this visit.   Facility-Administered Medications Ordered in Other Visits  Medication Dose Route Frequency Provider Last Rate Last Dose  . sodium chloride 0.9 % injection 10 mL  10 mL Intravenous PRN Forest Gleason, MD   10 mL at 03/01/15 1305    OBJECTIVE:  Filed Vitals:   06/27/15 1131  BP: 154/79  Pulse: 71  Temp: 97 F (36.1 C)     Body mass index is 18.55 kg/(m^2).    ECOG FS: 2  PHYSICAL EXAM: GENERAL  status: Performance status is 2  Patient has not lost significant weight Walking with the help of walker. HEENT: No evidence of stomatitis.  No swelling on the left side of the neck.  Osteonecrosis of jaw bone is stable.  No evidence of infection. Redness in the throat  Sclera and conjunctivae :: No jaundice.   pale looking . Lungs: Occasional rhonchi rhonchi.. Cardiac: Heart sounds are normal.  No pericardial rub.  No murmur. Lymphatic system: Cervical, axillary, inguinal, lymph nodes not palpable GI: Abdomen is soft.  No ascites.  Liver spleen not palpable.  No tenderness.  Bowel sounds are within normal limit Lower extremity:Patient has ulceration which is traumatic but gradually getting worse.  Redness around it  Neurologically, the patient was awake, alert, and oriented to person, place and time. There were no obvious focal neurologic abnormalities.ct No evidence of peripheral  neuropathy. Examination of the skin revealed no evidence of significant rashes, suspicious appearing nevi or other concerning lesions.    CBC Latest Ref Rng 06/27/2015 06/14/2015 05/25/2015  WBC 3.6 - 11.0 K/uL 2.9(L) 3.6 3.9  Hemoglobin 12.0 - 16.0 g/dL 13.4 13.0 13.3  Hematocrit 35.0 - 47.0 % 40.5 39.0 40.6  Platelets 150 - 440 K/uL 237 215 202                 ASSESSMENT: Multiple myeloma in relapse. On pomalidomide and steroid Clinically  patient is responding to the treatment All lab data has been reviewed Stable disease with stable M spike.  On pomolidomide Finney C cup this cycle.  Patient was advised to not to start new cycle until also seen of both lower extremity completely heals Ulcers in both lower extremity has healed.  One clinic note has been appreciated has been reviewed.  3.  Osteonecrosis of jaw bone stable, actually improving  Acute bronchitis has resolved.  MYELOsuppression without any fever. Patient will start the next cycle of lenalidomide in 2 years    Reevaluation in 4 weeks or before if needed      No matching staging information was found for the patient.  Forest Gleason, MD   06/27/2015 12:03 PM

## 2015-06-27 NOTE — Patient Instructions (Signed)
Restart pomalyst on 1/26 and take daily x 2 weeks.

## 2015-06-27 NOTE — Progress Notes (Signed)
Patient states she has not felt well for awhile.

## 2015-06-28 LAB — KAPPA/LAMBDA LIGHT CHAINS
Kappa free light chain: 15.77 mg/L (ref 3.30–19.40)
Kappa, lambda light chain ratio: 1.23 (ref 0.26–1.65)
Lambda free light chains: 12.81 mg/L (ref 5.71–26.30)

## 2015-06-29 LAB — MULTIPLE MYELOMA PANEL, SERUM
ALBUMIN/GLOB SERPL: 1.2 (ref 0.7–1.7)
ALPHA 1: 0.3 g/dL (ref 0.0–0.4)
Albumin SerPl Elph-Mcnc: 3.1 g/dL (ref 2.9–4.4)
Alpha2 Glob SerPl Elph-Mcnc: 0.7 g/dL (ref 0.4–1.0)
B-Globulin SerPl Elph-Mcnc: 1 g/dL (ref 0.7–1.3)
GAMMA GLOB SERPL ELPH-MCNC: 0.7 g/dL (ref 0.4–1.8)
GLOBULIN, TOTAL: 2.7 g/dL (ref 2.2–3.9)
IGA: 79 mg/dL (ref 64–422)
IgG (Immunoglobin G), Serum: 709 mg/dL (ref 700–1600)
IgM, Serum: 90 mg/dL (ref 26–217)
M Protein SerPl Elph-Mcnc: 0.4 g/dL — ABNORMAL HIGH
Total Protein ELP: 5.8 g/dL — ABNORMAL LOW (ref 6.0–8.5)

## 2015-07-24 ENCOUNTER — Other Ambulatory Visit: Payer: Self-pay | Admitting: *Deleted

## 2015-07-24 DIAGNOSIS — C9002 Multiple myeloma in relapse: Secondary | ICD-10-CM

## 2015-07-25 ENCOUNTER — Encounter: Payer: Self-pay | Admitting: Oncology

## 2015-07-25 ENCOUNTER — Ambulatory Visit: Payer: Medicare Other | Admitting: Oncology

## 2015-07-25 ENCOUNTER — Inpatient Hospital Stay (HOSPITAL_BASED_OUTPATIENT_CLINIC_OR_DEPARTMENT_OTHER): Payer: Medicare Other | Admitting: Oncology

## 2015-07-25 ENCOUNTER — Inpatient Hospital Stay: Payer: Medicare Other

## 2015-07-25 ENCOUNTER — Inpatient Hospital Stay: Payer: Medicare Other | Attending: Oncology

## 2015-07-25 ENCOUNTER — Other Ambulatory Visit: Payer: Medicare Other

## 2015-07-25 VITALS — BP 162/80 | HR 62 | Temp 97.3°F | Resp 18 | Wt 107.1 lb

## 2015-07-25 DIAGNOSIS — L97919 Non-pressure chronic ulcer of unspecified part of right lower leg with unspecified severity: Secondary | ICD-10-CM

## 2015-07-25 DIAGNOSIS — C9002 Multiple myeloma in relapse: Secondary | ICD-10-CM

## 2015-07-25 DIAGNOSIS — I4891 Unspecified atrial fibrillation: Secondary | ICD-10-CM | POA: Diagnosis not present

## 2015-07-25 DIAGNOSIS — M87180 Osteonecrosis due to drugs, jaw: Secondary | ICD-10-CM

## 2015-07-25 DIAGNOSIS — E785 Hyperlipidemia, unspecified: Secondary | ICD-10-CM | POA: Diagnosis not present

## 2015-07-25 DIAGNOSIS — L97929 Non-pressure chronic ulcer of unspecified part of left lower leg with unspecified severity: Secondary | ICD-10-CM | POA: Insufficient documentation

## 2015-07-25 DIAGNOSIS — M1990 Unspecified osteoarthritis, unspecified site: Secondary | ICD-10-CM | POA: Insufficient documentation

## 2015-07-25 DIAGNOSIS — Z95828 Presence of other vascular implants and grafts: Secondary | ICD-10-CM

## 2015-07-25 DIAGNOSIS — Z79899 Other long term (current) drug therapy: Secondary | ICD-10-CM | POA: Diagnosis not present

## 2015-07-25 DIAGNOSIS — T50995S Adverse effect of other drugs, medicaments and biological substances, sequela: Secondary | ICD-10-CM

## 2015-07-25 DIAGNOSIS — Z780 Asymptomatic menopausal state: Secondary | ICD-10-CM | POA: Diagnosis not present

## 2015-07-25 DIAGNOSIS — I10 Essential (primary) hypertension: Secondary | ICD-10-CM | POA: Diagnosis not present

## 2015-07-25 DIAGNOSIS — J209 Acute bronchitis, unspecified: Secondary | ICD-10-CM

## 2015-07-25 DIAGNOSIS — I2510 Atherosclerotic heart disease of native coronary artery without angina pectoris: Secondary | ICD-10-CM | POA: Diagnosis not present

## 2015-07-25 LAB — COMPREHENSIVE METABOLIC PANEL
ALT: 16 U/L (ref 14–54)
ANION GAP: 6 (ref 5–15)
AST: 17 U/L (ref 15–41)
Albumin: 3.6 g/dL (ref 3.5–5.0)
Alkaline Phosphatase: 60 U/L (ref 38–126)
BUN: 23 mg/dL — ABNORMAL HIGH (ref 6–20)
CHLORIDE: 105 mmol/L (ref 101–111)
CO2: 26 mmol/L (ref 22–32)
Calcium: 8.6 mg/dL — ABNORMAL LOW (ref 8.9–10.3)
Creatinine, Ser: 0.82 mg/dL (ref 0.44–1.00)
Glucose, Bld: 90 mg/dL (ref 65–99)
POTASSIUM: 3.8 mmol/L (ref 3.5–5.1)
Sodium: 137 mmol/L (ref 135–145)
TOTAL PROTEIN: 6.4 g/dL — AB (ref 6.5–8.1)
Total Bilirubin: 0.8 mg/dL (ref 0.3–1.2)

## 2015-07-25 LAB — CBC WITH DIFFERENTIAL/PLATELET
Basophils Absolute: 0 10*3/uL (ref 0–0.1)
Basophils Relative: 1 %
EOS PCT: 3 %
Eosinophils Absolute: 0.1 10*3/uL (ref 0–0.7)
HEMATOCRIT: 37.3 % (ref 35.0–47.0)
Hemoglobin: 12.5 g/dL (ref 12.0–16.0)
LYMPHS PCT: 21 %
Lymphs Abs: 0.7 10*3/uL — ABNORMAL LOW (ref 1.0–3.6)
MCH: 30.8 pg (ref 26.0–34.0)
MCHC: 33.6 g/dL (ref 32.0–36.0)
MCV: 91.5 fL (ref 80.0–100.0)
MONO ABS: 0.9 10*3/uL (ref 0.2–0.9)
MONOS PCT: 27 %
NEUTROS ABS: 1.6 10*3/uL (ref 1.4–6.5)
Neutrophils Relative %: 48 %
PLATELETS: 154 10*3/uL (ref 150–440)
RBC: 4.08 MIL/uL (ref 3.80–5.20)
RDW: 17.1 % — AB (ref 11.5–14.5)
WBC: 3.3 10*3/uL — ABNORMAL LOW (ref 3.6–11.0)

## 2015-07-25 MED ORDER — SODIUM CHLORIDE 0.9% FLUSH
10.0000 mL | INTRAVENOUS | Status: DC | PRN
  Administered 2015-07-25: 10 mL via INTRAVENOUS
  Filled 2015-07-25: qty 10

## 2015-07-25 MED ORDER — HEPARIN SOD (PORK) LOCK FLUSH 100 UNIT/ML IV SOLN
500.0000 [IU] | Freq: Once | INTRAVENOUS | Status: AC
  Administered 2015-07-25: 500 [IU] via INTRAVENOUS

## 2015-07-25 NOTE — Progress Notes (Signed)
Patient states she continues to have fatigue.  Otherwise, no complaints.

## 2015-07-26 ENCOUNTER — Encounter: Payer: Self-pay | Admitting: Oncology

## 2015-07-26 NOTE — Progress Notes (Signed)
[place] @ 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  INTERVAL HISTORY:  82 year old lady came today further follow-up for assessment of multiple myeloma. Patient also has nonhealing ulcers in both lower extremity being evaluated by wound care.  Center Patient came as an acute add-on complaining of cough yellowish expectoration and low-grade fever sinus congestion.  Started few days ago. Patient is presently taking pomalidomide or recurrent multiple myeloma Patient is feeling better.  Upper respiratory tract infection is resolved. Myelosuppression without fever CONTINUES  to have problems maintaining balance Center is here for further follow-up regarding multiple myeloma. Taking palpable t  POMOLID  2 weeks on and 2 weeks off   REVIEW OF SYSTEMS:   Gen. status: Performance status is 2.  Patient is feeling better.  Appetite is improving.  HEENT: Patient has osteonecrosis of jaw bone.  Complains of sinus congestion.  Sinus drainage. Lungs: Cough yellowish expectoration low-grade fever.  Cardiac: No chest pain .  GI: Appetite is getting better no nausea no vomiting no diarrhea or rectal bleeding.  GU: No dysuria or hematuria.  Neurological system: Continues to no problem with maintaining balance.  No tingling.  No numbness.  Lower extremity no swellings.  Skin: No rash.  No skeletal system joint pains continue to bother patient perstatus is improving. Swelling in the left jaw has been All other systems have been reviewed O significant family history  As per HPI. Otherwise, a complete review of  systems is negatve.  PAST MEDICAL HISTORY: Past Medical History  Diagnosis Date  . Osteonecrosis of jaw ([place])     left  . Anxiety   . Coronary arteriosclerosis   . Hypertension   . Hyperlipidemia   . Arthritis   . Atrial fibrillation ([place])   . Multiple myeloma ([place])   . Multiple myeloma in relapse ([place]) 10/29/2014  . History of blood transfusion 2014    PAST SURGICAL HISTORY: Past Surgical History  Procedure Laterality Date  . Appendectomy    . Abdominal hysterectomy    . Total knee revision Left   . 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 Patient is postmenopausal.     ADVANCED DIRECTIVES:   Patient does have advance healthcare directive, Patient   does not desire to make any changes Patient did bring power of attorney and living will which has been copied and scanned in the chart HEALTH MAINTENANCE: Social History  Substance Use Topics  . Smoking status: Never Smoker   . Smokeless tobacco: Never Used  . Alcohol Use: 1.2 oz/week    2 Glasses of wine per week      Allergies  Allergen Reactions  .  Biaxin [Clarithromycin] Diarrhea and Nausea And Vomiting  . Demerol [Meperidine] Nausea And Vomiting  . Hydrocodone Nausea And Vomiting  . Sulfa Antibiotics Hives  . Tramadol Diarrhea and Nausea And Vomiting  . Ultracet [Tramadol-Acetaminophen] Other (See Comments)    Current Outpatient Prescriptions  Medication Sig Dispense Refill  . Biotin 1 MG CAPS Take by mouth.    . dexamethasone (DECADRON) 4 MG tablet Take 5 tablets (20 mg total)  by mouth daily. Take on day 1, day 8, and day 15. 15 tablet 0  . ibuprofen (ADVIL,MOTRIN) 800 MG tablet Take 1 tablet (800 mg total) by mouth every 8 (eight) hours as needed for moderate pain. 30 tablet 3  . POMALYST 4 MG capsule Take 1 capsule (4 mg total) by mouth daily. For 2 weeks then 2 weeks off. 14 capsule 3  . amoxicillin-clavulanate (AUGMENTIN) 875-125 MG tablet      No current facility-administered medications for this visit.   Facility-Administered Medications Ordered in Other Visits  Medication Dose Route Frequency Provider Last Rate Last Dose  . sodium chloride 0.9 % injection 10 mL  10 mL Intravenous PRN Forest Gleason, MD   10 mL at 03/01/15 1305    OBJECTIVE:  Filed Vitals:   07/25/15 1137  BP: 162/80  Pulse: 62  Temp: 97.3 F (36.3 C)  Resp: 18     Body mass index is 19.58 kg/(m^2).    ECOG FS: 2  PHYSICAL EXAM: GENERAL  status: Performance status is 2  Patient has not lost significant weight Walking with the help of walker. HEENT: No evidence of stomatitis.  No swelling on the left side of the neck.  Osteonecrosis of jaw bone is stable.  No evidence of infection. Redness in the throat  Sclera and conjunctivae :: No jaundice.   pale looking . Lungs: Occasional rhonchi rhonchi.. Cardiac: Heart sounds are normal.  No pericardial rub.  No murmur. Lymphatic system: Cervical, axillary, inguinal, lymph nodes not palpable GI: Abdomen is soft.  No ascites.  Liver spleen not palpable.  No tenderness.  Bowel sounds are within normal limit Lower extremity:Patient has ulceration which is traumatic but gradually getting worse.  Redness around it  Neurologically, the patient was awake, alert, and oriented to person, place and time. There were no obvious focal neurologic abnormalities.ct No evidence of peripheral neuropathy. Examination of the skin revealed no evidence of significant rashes, suspicious appearing nevi or other concerning lesions.    CBC Latest Ref Rng  07/25/2015 06/27/2015 06/14/2015  WBC 3.6 - 11.0 K/uL 3.3(L) 2.9(L) 3.6  Hemoglobin 12.0 - 16.0 g/dL 12.5 13.4 13.0  Hematocrit 35.0 - 47.0 % 37.3 40.5 39.0  Platelets 150 - 440 K/uL 154 237 215                 ASSESSMENT: Multiple myeloma in relapse. On pomalidomide and steroid Clinically  patient is responding to the treatment All lab data has been reviewed Myeloma panel and carpi lambda light chain pending. Reevaluation in 4 weeks.  3.  Osteonecrosis of jaw bone stable, actually improving  Acute bronchitis has resolved. MYELOsuppression without any fever. Patient will start the next cycle of lenalidomide in 2 years    Reevaluation in 4 weeks or before if needed      No matching staging information was found for the patient.  Forest Gleason, MD   07/26/2015 12:23 PM

## 2015-07-27 LAB — MULTIPLE MYELOMA PANEL, SERUM
Albumin SerPl Elph-Mcnc: 3.3 g/dL (ref 2.9–4.4)
Albumin/Glob SerPl: 1.3 (ref 0.7–1.7)
Alpha 1: 0.2 g/dL (ref 0.0–0.4)
Alpha2 Glob SerPl Elph-Mcnc: 0.8 g/dL (ref 0.4–1.0)
B-Globulin SerPl Elph-Mcnc: 1 g/dL (ref 0.7–1.3)
Gamma Glob SerPl Elph-Mcnc: 0.7 g/dL (ref 0.4–1.8)
Globulin, Total: 2.7 g/dL (ref 2.2–3.9)
IgA: 53 mg/dL — ABNORMAL LOW (ref 64–422)
IgG (Immunoglobin G), Serum: 641 mg/dL — ABNORMAL LOW (ref 700–1600)
IgM, Serum: 71 mg/dL (ref 26–217)
M Protein SerPl Elph-Mcnc: 0.4 g/dL — ABNORMAL HIGH
Total Protein ELP: 6 g/dL (ref 6.0–8.5)

## 2015-07-27 LAB — KAPPA/LAMBDA LIGHT CHAINS
KAPPA, LAMDA LIGHT CHAIN RATIO: 1.38 (ref 0.26–1.65)
Kappa free light chain: 13.78 mg/L (ref 3.30–19.40)
LAMDA FREE LIGHT CHAINS: 9.95 mg/L (ref 5.71–26.30)

## 2015-08-04 ENCOUNTER — Telehealth: Payer: Self-pay | Admitting: *Deleted

## 2015-08-04 ENCOUNTER — Other Ambulatory Visit: Payer: Self-pay | Admitting: Family Medicine

## 2015-08-04 MED ORDER — LEVOFLOXACIN 500 MG PO TABS: 500.0000 mg | ORAL_TABLET | Freq: Every day | ORAL | Status: DC

## 2015-08-04 NOTE — Telephone Encounter (Signed)
Called to report that she has has nasal congestion for 3 days and is blowing out yellow mucous form her nose, denies fever. Reports she is coughing nonproductively also. She has not seen her PCP "in a very long time". Requesting that we call something in for her

## 2015-08-04 NOTE — Telephone Encounter (Addendum)
Per L Herring, AGNP-C Levaquin ordered, pt informed of this and informed per Magda Paganini that she can get an OTC nasal spray to use if she wants it. Pt asked about cough med and I instructed her to try OTC robitussin for that as well. She repeated this back to me.

## 2015-08-22 ENCOUNTER — Inpatient Hospital Stay: Payer: Medicare Other | Attending: Oncology

## 2015-08-22 ENCOUNTER — Encounter: Payer: Self-pay | Admitting: Oncology

## 2015-08-22 ENCOUNTER — Inpatient Hospital Stay (HOSPITAL_BASED_OUTPATIENT_CLINIC_OR_DEPARTMENT_OTHER): Payer: Medicare Other | Admitting: Oncology

## 2015-08-22 VITALS — BP 132/78 | HR 71 | Temp 97.2°F | Resp 18 | Wt 107.4 lb

## 2015-08-22 DIAGNOSIS — T50995S Adverse effect of other drugs, medicaments and biological substances, sequela: Secondary | ICD-10-CM | POA: Insufficient documentation

## 2015-08-22 DIAGNOSIS — I2510 Atherosclerotic heart disease of native coronary artery without angina pectoris: Secondary | ICD-10-CM | POA: Diagnosis not present

## 2015-08-22 DIAGNOSIS — Z452 Encounter for adjustment and management of vascular access device: Secondary | ICD-10-CM | POA: Insufficient documentation

## 2015-08-22 DIAGNOSIS — I10 Essential (primary) hypertension: Secondary | ICD-10-CM | POA: Diagnosis not present

## 2015-08-22 DIAGNOSIS — F419 Anxiety disorder, unspecified: Secondary | ICD-10-CM | POA: Diagnosis not present

## 2015-08-22 DIAGNOSIS — I4891 Unspecified atrial fibrillation: Secondary | ICD-10-CM | POA: Insufficient documentation

## 2015-08-22 DIAGNOSIS — C9002 Multiple myeloma in relapse: Secondary | ICD-10-CM | POA: Diagnosis not present

## 2015-08-22 DIAGNOSIS — E785 Hyperlipidemia, unspecified: Secondary | ICD-10-CM | POA: Diagnosis not present

## 2015-08-22 DIAGNOSIS — Z79899 Other long term (current) drug therapy: Secondary | ICD-10-CM | POA: Diagnosis not present

## 2015-08-22 DIAGNOSIS — Z9221 Personal history of antineoplastic chemotherapy: Secondary | ICD-10-CM | POA: Insufficient documentation

## 2015-08-22 DIAGNOSIS — M1990 Unspecified osteoarthritis, unspecified site: Secondary | ICD-10-CM

## 2015-08-22 DIAGNOSIS — M87180 Osteonecrosis due to drugs, jaw: Secondary | ICD-10-CM

## 2015-08-22 LAB — COMPREHENSIVE METABOLIC PANEL
ALBUMIN: 3.7 g/dL (ref 3.5–5.0)
ALK PHOS: 78 U/L (ref 38–126)
ALT: 14 U/L (ref 14–54)
ANION GAP: 5 (ref 5–15)
AST: 16 U/L (ref 15–41)
BUN: 18 mg/dL (ref 6–20)
CALCIUM: 8.7 mg/dL — AB (ref 8.9–10.3)
CHLORIDE: 102 mmol/L (ref 101–111)
CO2: 28 mmol/L (ref 22–32)
Creatinine, Ser: 0.63 mg/dL (ref 0.44–1.00)
GFR calc Af Amer: >60 mL/min (ref >60)
GFR calc non Af Amer: >60 mL/min (ref >60)
GLUCOSE: 91 mg/dL (ref 65–99)
Potassium: 4 mmol/L (ref 3.5–5.1)
SODIUM: 135 mmol/L (ref 135–145)
Total Bilirubin: 0.5 mg/dL (ref 0.3–1.2)
Total Protein: 6.9 g/dL (ref 6.5–8.1)

## 2015-08-22 LAB — CBC WITH DIFFERENTIAL/PLATELET
BASOS PCT: 1 %
Basophils Absolute: 0 10*3/uL (ref 0–0.1)
EOS ABS: 0.1 10*3/uL (ref 0–0.7)
Eosinophils Relative: 3 %
HCT: 38.9 % (ref 35.0–47.0)
HEMOGLOBIN: 13.1 g/dL (ref 12.0–16.0)
Lymphocytes Relative: 26 %
Lymphs Abs: 0.8 10*3/uL — ABNORMAL LOW (ref 1.0–3.6)
MCH: 31.5 pg (ref 26.0–34.0)
MCHC: 33.8 g/dL (ref 32.0–36.0)
MCV: 93.3 fL (ref 80.0–100.0)
Monocytes Absolute: 0.9 10*3/uL (ref 0.2–0.9)
Monocytes Relative: 32 %
NEUTROS PCT: 38 %
Neutro Abs: 1.2 10*3/uL — ABNORMAL LOW (ref 1.4–6.5)
Platelets: 317 10*3/uL (ref 150–440)
RBC: 4.17 MIL/uL (ref 3.80–5.20)
RDW: 17.8 % — ABNORMAL HIGH (ref 11.5–14.5)
WBC: 3 10*3/uL — AB (ref 3.6–11.0)

## 2015-08-22 NOTE — Progress Notes (Signed)
Patient states she "just does not feel good".  No pain and no specific complaints.

## 2015-08-22 NOTE — Progress Notes (Signed)
[place] @ [facility] Telephone:(336) [phone]  Fax:(336) [place] OB: [birth year]  MR#: [id number]  JOI#:[id number]  Patient Care Team: Derinda Late, MD as PCP - General (Family Medicine) Forest Gleason, MD (Oncology) Seeplaputhur Robinette Haines, MD (General Surgery)  CHIEF COMPLAINT:  Multiple myeloma on  POMOLIDOMIDE Osteonecrosis of jaw bone pain secondary to biphosphonate therapy with Zometa     No flowsheet data found.  INTERVAL HISTORY:  82 year old lady came today further follow-up for assessment of multiple myeloma. Patient is here for further evaluation regarding multiple myeloma and continuation of treatment with POMOLIDOMIDE and Decadron. Osteonecrosis of jaw bone has been gradually getting better.  Patient did not have any more swelling pain.  No difficulty swallowing.  No bony pain.  Patient continues to have problems maintaining balance but did not have any significant fall.  Here for further follow-up and treatment consideration    REVIEW OF SYSTEMS:   Gen. status: Performance status is 2.  Patient is feeling better.  Appetite is improving.  HEENT: Patient has osteonecrosis of jaw bone.  Complains of sinus congestion.  Sinus drainage. Lungs: Cough yellowish expectoration low-grade fever.  Cardiac: No chest pain .  GI: Appetite is getting better no nausea no vomiting no diarrhea or rectal bleeding.  GU: No dysuria or hematuria.  Neurological system: Continues to no problem with maintaining balance.  No tingling.  No numbness.  Lower extremity no swellings.  Skin: No rash.  No skeletal system joint pains continue to bother patient perstatus is improving. Swelling in the left jaw has been All other systems have been reviewed O significant family history  As per HPI. Otherwise, a complete review of systems is negatve.  PAST MEDICAL HISTORY: Past Medical History  Diagnosis Date  . Osteonecrosis of jaw ([place])     left  . Anxiety   . Coronary  arteriosclerosis   . Hypertension   . Hyperlipidemia   . Arthritis   . Atrial fibrillation ([place])   . Multiple myeloma ([place])   . Multiple myeloma in relapse ([place]) 10/29/2014  . History of blood transfusion 2014    PAST SURGICAL HISTORY: Past Surgical History  Procedure Laterality Date  . Appendectomy    . Abdominal hysterectomy    . Total knee revision Left   . Partial colectomy    Significant History/PMH:   Multiple Myeloma:    Coronary Artherosclerosis:    Hypertension:    Hyperlipidemia:    arthritis.:    Atrial Fibrillation:    hysterectomy:    left total knee replacement: 18-Mar-2007   Surgery to lift dropped kidney: 1945   D&C:    appendectomy:    Partial colectomy:    FAMILY HISTORY No significant family history of colon cancer breast cancer or ovarian cancer     ADVANCED DIRECTIVES:   Patient does have advance healthcare directive, Patient   does not desire to make any changes Patient did bring power of attorney and living will which has been copied and scanned in the chart HEALTH MAINTENANCE: Social History  Substance Use Topics  . Smoking status: Never Smoker   . Smokeless tobacco: Never Used  . Alcohol Use: 1.2 oz/week    2 Glasses of wine per week      Allergies  Allergen Reactions  . Biaxin [Clarithromycin] Diarrhea and Nausea And Vomiting  . Demerol [Meperidine] Nausea And Vomiting  . Hydrocodone Nausea And Vomiting  . Sulfa Antibiotics Hives  . Tramadol Diarrhea and Nausea  And Vomiting  . Ultracet [Tramadol-Acetaminophen] Other (See Comments)    Current Outpatient Prescriptions  Medication Sig Dispense Refill  . Biotin 1 MG CAPS Take by mouth.    . dexamethasone (DECADRON) 4 MG tablet Take 5 tablets (20 mg total) by mouth daily. Take on day 1, day 8, and day 15. 15 tablet 0  . ibuprofen (ADVIL,MOTRIN) 800 MG tablet Take 1 tablet (800 mg total) by mouth every 8 (eight) hours as needed for moderate pain. 30 tablet 3  . POMALYST  4 MG capsule Take 1 capsule (4 mg total) by mouth daily. For 2 weeks then 2 weeks off. 14 capsule 3   No current facility-administered medications for this visit.   Facility-Administered Medications Ordered in Other Visits  Medication Dose Route Frequency Provider Last Rate Last Dose  . sodium chloride 0.9 % injection 10 mL  10 mL Intravenous PRN Forest Gleason, MD   10 mL at 03/01/15 1305    OBJECTIVE:  Filed Vitals:   08/22/15 1549  BP: 132/78  Pulse: 71  Temp: 97.2 F (36.2 C)  Resp: 18     Body mass index is 19.65 kg/(m^2).    ECOG FS: 2  PHYSICAL EXAM: GENERAL  status: Performance status is 2  Patient has not lost significant weight Walking with the help of walker. HEENT: No evidence of stomatitis.  No swelling on the left side of the neck.  Osteonecrosis of jaw bone is stable.  No evidence of infection. Redness in the throat has improved.  Open wound in the mouth has healed up.  Sclera and conjunctivae :: No jaundice.   pale looking . Lungs: Occasional rhonchi rhonchi.. Cardiac: Heart sounds are normal.  No pericardial rub.  No murmur. Lymphatic system: Cervical, axillary, inguinal, lymph nodes not palpable GI: Abdomen is soft.  No ascites.  Liver spleen not palpable.  No tenderness.  Bowel sounds are within normal limit Lower extremity:Patient has ulceration which is traumatic but gradually getting worse.  Redness around it  Neurologically, the patient was awake, alert, and oriented to person, place and time. There were no obvious focal neurologic abnormalities.ct No evidence of peripheral neuropathy. Examination of the skin revealed no evidence of significant rashes, suspicious appearing nevi or other concerning lesions.    CBC Latest Ref Rng 08/22/2015 07/25/2015 06/27/2015  WBC 3.6 - 11.0 K/uL 3.0(L) 3.3(L) 2.9(L)  Hemoglobin 12.0 - 16.0 g/dL 13.1 12.5 13.4  Hematocrit 35.0 - 47.0 % 38.9 37.3 40.5  Platelets 150 - 440 K/uL 317 154 237                  ASSESSMENT: Multiple myeloma in relapse. On pomalidomide and steroid Clinically  patient is responding to the treatment All lab data has been reviewed   3.  Osteonecrosis of jaw bone stable, actually improving  Acute bronchitis has resolved. MYELOsuppression without any fever. Patient will start the next cycle of lenalidomide in 2 years  Complete blood count as well as chemistry profile is within normal limits. Light chain as well as myeloma profile is pending Patient will start again    POMOLIDOMIDE 2 weeks on and 2 weeks off with Decadron.  Patient will be reevaluated in 5 weeks.  Patient will be keen POMOLIDOMIDE on August 31, 2015  Reevaluation in 4 weeks or before if needed      No matching staging information was found for the patient.  Forest Gleason, MD   08/22/2015 4:23 PM

## 2015-08-23 LAB — KAPPA/LAMBDA LIGHT CHAINS
Kappa free light chain: 16.56 mg/L (ref 3.30–19.40)
Kappa, lambda light chain ratio: 1.52 (ref 0.26–1.65)
Lambda free light chains: 10.92 mg/L (ref 5.71–26.30)

## 2015-08-24 ENCOUNTER — Other Ambulatory Visit: Payer: Medicare Other

## 2015-08-24 ENCOUNTER — Ambulatory Visit: Payer: Medicare Other | Admitting: Oncology

## 2015-08-24 LAB — MULTIPLE MYELOMA PANEL, SERUM
ALBUMIN SERPL ELPH-MCNC: 3.2 g/dL (ref 2.9–4.4)
ALBUMIN/GLOB SERPL: 1.3 (ref 0.7–1.7)
ALPHA 1: 0.2 g/dL (ref 0.0–0.4)
Alpha2 Glob SerPl Elph-Mcnc: 0.7 g/dL (ref 0.4–1.0)
B-GLOBULIN SERPL ELPH-MCNC: 1 g/dL (ref 0.7–1.3)
GAMMA GLOB SERPL ELPH-MCNC: 0.7 g/dL (ref 0.4–1.8)
GLOBULIN, TOTAL: 2.6 g/dL (ref 2.2–3.9)
IgA: 66 mg/dL (ref 64–422)
IgG (Immunoglobin G), Serum: 642 mg/dL — ABNORMAL LOW (ref 700–1600)
IgM, Serum: 75 mg/dL (ref 26–217)
M PROTEIN SERPL ELPH-MCNC: 0.4 g/dL — AB
Total Protein ELP: 5.8 g/dL — ABNORMAL LOW (ref 6.0–8.5)

## 2015-09-05 ENCOUNTER — Inpatient Hospital Stay: Payer: Medicare Other

## 2015-09-05 DIAGNOSIS — C801 Malignant (primary) neoplasm, unspecified: Secondary | ICD-10-CM

## 2015-09-05 DIAGNOSIS — C9002 Multiple myeloma in relapse: Secondary | ICD-10-CM | POA: Diagnosis not present

## 2015-09-05 MED ORDER — SODIUM CHLORIDE 0.9% FLUSH
10.0000 mL | INTRAVENOUS | Status: DC | PRN
  Administered 2015-09-05: 10 mL via INTRAVENOUS
  Filled 2015-09-05: qty 10

## 2015-09-05 MED ORDER — HEPARIN SOD (PORK) LOCK FLUSH 100 UNIT/ML IV SOLN
500.0000 [IU] | Freq: Once | INTRAVENOUS | Status: AC
  Administered 2015-09-05: 500 [IU] via INTRAVENOUS

## 2015-09-05 MED ORDER — HEPARIN SOD (PORK) LOCK FLUSH 100 UNIT/ML IV SOLN
INTRAVENOUS | Status: AC
  Filled 2015-09-05: qty 5

## 2015-09-21 ENCOUNTER — Telehealth: Payer: Self-pay | Admitting: *Deleted

## 2015-09-21 DIAGNOSIS — C9002 Multiple myeloma in relapse: Secondary | ICD-10-CM

## 2015-09-21 MED ORDER — POMALYST 4 MG PO CAPS: 4.0000 mg | ORAL_CAPSULE | Freq: Every day | ORAL | Status: DC

## 2015-09-21 NOTE — Telephone Encounter (Signed)
Refill for pomalyst escribed to pharmacy.

## 2015-09-28 ENCOUNTER — Inpatient Hospital Stay: Payer: Medicare Other | Attending: Oncology

## 2015-09-28 ENCOUNTER — Inpatient Hospital Stay (HOSPITAL_BASED_OUTPATIENT_CLINIC_OR_DEPARTMENT_OTHER): Payer: Medicare Other | Admitting: Oncology

## 2015-09-28 VITALS — BP 155/71 | HR 81 | Temp 97.7°F | Resp 18 | Wt 109.1 lb

## 2015-09-28 DIAGNOSIS — I4891 Unspecified atrial fibrillation: Secondary | ICD-10-CM | POA: Insufficient documentation

## 2015-09-28 DIAGNOSIS — F419 Anxiety disorder, unspecified: Secondary | ICD-10-CM | POA: Diagnosis not present

## 2015-09-28 DIAGNOSIS — D759 Disease of blood and blood-forming organs, unspecified: Secondary | ICD-10-CM | POA: Diagnosis not present

## 2015-09-28 DIAGNOSIS — L97909 Non-pressure chronic ulcer of unspecified part of unspecified lower leg with unspecified severity: Secondary | ICD-10-CM | POA: Diagnosis not present

## 2015-09-28 DIAGNOSIS — Z7952 Long term (current) use of systemic steroids: Secondary | ICD-10-CM

## 2015-09-28 DIAGNOSIS — T50995S Adverse effect of other drugs, medicaments and biological substances, sequela: Secondary | ICD-10-CM | POA: Diagnosis not present

## 2015-09-28 DIAGNOSIS — Z79899 Other long term (current) drug therapy: Secondary | ICD-10-CM | POA: Insufficient documentation

## 2015-09-28 DIAGNOSIS — I2510 Atherosclerotic heart disease of native coronary artery without angina pectoris: Secondary | ICD-10-CM

## 2015-09-28 DIAGNOSIS — I10 Essential (primary) hypertension: Secondary | ICD-10-CM

## 2015-09-28 DIAGNOSIS — C9002 Multiple myeloma in relapse: Secondary | ICD-10-CM | POA: Insufficient documentation

## 2015-09-28 DIAGNOSIS — E785 Hyperlipidemia, unspecified: Secondary | ICD-10-CM

## 2015-09-28 DIAGNOSIS — M87180 Osteonecrosis due to drugs, jaw: Secondary | ICD-10-CM

## 2015-09-28 DIAGNOSIS — Z9221 Personal history of antineoplastic chemotherapy: Secondary | ICD-10-CM | POA: Diagnosis not present

## 2015-09-28 LAB — COMPREHENSIVE METABOLIC PANEL
ALT: 14 U/L (ref 14–54)
AST: 21 U/L (ref 15–41)
Albumin: 4.1 g/dL (ref 3.5–5.0)
Alkaline Phosphatase: 77 U/L (ref 38–126)
Anion gap: 5 (ref 5–15)
BILIRUBIN TOTAL: 0.4 mg/dL (ref 0.3–1.2)
BUN: 16 mg/dL (ref 6–20)
CO2: 27 mmol/L (ref 22–32)
CREATININE: 0.81 mg/dL (ref 0.44–1.00)
Calcium: 8.6 mg/dL — ABNORMAL LOW (ref 8.9–10.3)
Chloride: 104 mmol/L (ref 101–111)
GFR calc Af Amer: >60 mL/min (ref >60)
Glucose, Bld: 99 mg/dL (ref 65–99)
Potassium: 3.7 mmol/L (ref 3.5–5.1)
Sodium: 136 mmol/L (ref 135–145)
TOTAL PROTEIN: 7 g/dL (ref 6.5–8.1)

## 2015-09-28 LAB — CBC WITH DIFFERENTIAL/PLATELET
BASOS ABS: 0.1 10*3/uL (ref 0–0.1)
BASOS PCT: 3 %
Eosinophils Absolute: 0 10*3/uL (ref 0–0.7)
Eosinophils Relative: 1 %
HEMATOCRIT: 38.9 % (ref 35.0–47.0)
Hemoglobin: 13.2 g/dL (ref 12.0–16.0)
Lymphocytes Relative: 22 %
Lymphs Abs: 0.8 10*3/uL — ABNORMAL LOW (ref 1.0–3.6)
MCH: 32.7 pg (ref 26.0–34.0)
MCHC: 33.9 g/dL (ref 32.0–36.0)
MCV: 96.3 fL (ref 80.0–100.0)
MONO ABS: 0.8 10*3/uL (ref 0.2–0.9)
Monocytes Relative: 22 %
NEUTROS ABS: 1.8 10*3/uL (ref 1.4–6.5)
Neutrophils Relative %: 52 %
Platelets: 278 10*3/uL (ref 150–440)
RBC: 4.04 MIL/uL (ref 3.80–5.20)
RDW: 16.2 % — AB (ref 11.5–14.5)
WBC: 3.6 10*3/uL (ref 3.6–11.0)

## 2015-09-28 NOTE — Progress Notes (Signed)
Patient has been having right hip pain but states it is better today.

## 2015-09-29 LAB — KAPPA/LAMBDA LIGHT CHAINS
KAPPA FREE LGHT CHN: 15.65 mg/L (ref 3.30–19.40)
Kappa, lambda light chain ratio: 1.71 — ABNORMAL HIGH (ref 0.26–1.65)
Lambda free light chains: 9.14 mg/L (ref 5.71–26.30)

## 2015-09-30 ENCOUNTER — Encounter: Payer: Self-pay | Admitting: Oncology

## 2015-09-30 NOTE — Progress Notes (Signed)
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  INTERVAL HISTORY:  82 year old lady came today further follow-up for assessment of multiple myeloma. Patient is here for further evaluation regarding multiple myeloma and continuation of treatment with POMOLIDOMIDE and Decadron. Osteonecrosis of jaw bone has been gradually getting better.  Patient did not have any more swelling pain.  No difficulty swallowing.  No bony pain.   Patient is here for ongoing evaluation and treatment consideration.  Bony pains have improved.  No joint swelling.  Patient is in wheelchair but at home walking with the help of walker   REVIEW OF SYSTEMS:   Gen. status: Performance status is 2.  Patient is feeling better.  Appetite is improving.  HEENT: Patient has osteonecrosis of jaw bone.  Complains of sinus congestion.  Sinus drainage. Lungs: Cough yellowish expectoration low-grade fever.  Cardiac: No chest pain .  GI: Appetite is getting better no nausea no vomiting no diarrhea or rectal bleeding.  GU: No dysuria or hematuria.  Neurological system: Continues to no problem with maintaining balance.  No tingling.  No numbness.  Lower extremity no swellings.  Skin: No rash.  No skeletal system joint pains continue to bother patient perstatus is improving. Swelling in the left jaw has been All other systems have been reviewed O significant family history  As per HPI. Otherwise, a complete review of systems is negatve.  PAST MEDICAL HISTORY: Past Medical History  Diagnosis Date  . Osteonecrosis of jaw ([place])    left  . Anxiety   . Coronary arteriosclerosis   . Hypertension   . Hyperlipidemia   . Arthritis   . Atrial fibrillation ([place])   . Multiple myeloma ([place])   . Multiple myeloma in relapse ([place]) 10/29/2014  . History of blood transfusion 2014    PAST SURGICAL HISTORY: Past Surgical History  Procedure Laterality Date  . Appendectomy    . Abdominal hysterectomy    . Total knee revision Left   . Partial colectomy    Significant History/PMH:   Multiple Myeloma:    Coronary Artherosclerosis:    Hypertension:    Hyperlipidemia:    arthritis.:    Atrial Fibrillation:    hysterectomy:    left total knee replacement: 18-Mar-2007   Surgery to lift dropped kidney: 1945   D&C:    appendectomy:    Partial colectomy:    FAMILY HISTORY No significant family history of colon cancer breast cancer or ovarian cancer     ADVANCED DIRECTIVES:   Patient does have advance healthcare directive, Patient   does not desire to make any changes Patient did bring power of attorney and living will which has been copied and scanned in the chart HEALTH MAINTENANCE: Social History  Substance Use Topics  . Smoking status: Never Smoker   . Smokeless tobacco: Never Used  . Alcohol Use: 1.2 oz/week    2 Glasses of wine per week      Allergies  Allergen Reactions  . Biaxin [Clarithromycin] Diarrhea and Nausea And Vomiting  . Demerol [Meperidine] Nausea And Vomiting  . Hydrocodone Nausea And Vomiting  . Sulfa  Antibiotics Hives  . Tramadol Diarrhea and Nausea And Vomiting  . Ultracet [Tramadol-Acetaminophen] Other (See Comments)    Current Outpatient Prescriptions  Medication Sig Dispense Refill  . Biotin 1 MG CAPS Take by mouth.    . dexamethasone (DECADRON) 4 MG tablet Take 5 tablets (20 mg total) by mouth daily. Take on day 1, day 8, and day 15. 15 tablet 0  . ibuprofen (ADVIL,MOTRIN) 800 MG tablet Take 1 tablet (800 mg total) by mouth every 8 (eight) hours as needed for moderate  pain. 30 tablet 3  . POMALYST 4 MG capsule Take 1 capsule (4 mg total) by mouth daily. For 2 weeks then 2 weeks off. 14 capsule 0   No current facility-administered medications for this visit.   Facility-Administered Medications Ordered in Other Visits  Medication Dose Route Frequency Provider Last Rate Last Dose  . sodium chloride 0.9 % injection 10 mL  10 mL Intravenous PRN Forest Gleason, MD   10 mL at 03/01/15 1305    OBJECTIVE:  Filed Vitals:   09/28/15 1508  BP: 155/71  Pulse: 81  Temp: 97.7 F (36.5 C)  Resp: 18     Body mass index is 19.94 kg/(m^2).    ECOG FS: 2  PHYSICAL EXAM: GENERAL  status: Performance status is 2  Patient has not lost significant weight Walking with the help of walker. HEENT: No evidence of stomatitis.  No swelling on the left side of the neck.  Osteonecrosis of jaw bone is stable.  No evidence of infection. Redness in the throat has improved.  Open wound in the mouth has healed up.  Sclera and conjunctivae :: No jaundice.   pale looking . Lungs: Occasional rhonchi rhonchi.. Cardiac: Heart sounds are normal.  No pericardial rub.  No murmur. Lymphatic system: Cervical, axillary, inguinal, lymph nodes not palpable GI: Abdomen is soft.  No ascites.  Liver spleen not palpable.  No tenderness.  Bowel sounds are within normal limit Lower extremity:Patient has ulceration which is traumatic but gradually getting worse.  Redness around it  Neurologically, the patient was awake, alert, and oriented to person, place and time. There were no obvious focal neurologic abnormalities.ct No evidence of peripheral neuropathy. Examination of the skin revealed no evidence of significant rashes, suspicious appearing nevi or other concerning lesions.    CBC Latest Ref Rng 09/28/2015 08/22/2015 07/25/2015  WBC 3.6 - 11.0 K/uL 3.6 3.0(L) 3.3(L)  Hemoglobin 12.0 - 16.0 g/dL 13.2 13.1 12.5  Hematocrit 35.0 - 47.0 % 38.9 38.9 37.3  Platelets 150 - 440 K/uL 278 317 154                  ASSESSMENT: Multiple myeloma in relapse. On pomalidomide and steroid Clinically  patient is responding to the treatment All lab data has been reviewed   3.  Osteonecrosis of jaw bone stable, actually improving  Acute bronchitis has resolved. MYELOsuppression without any fever. Patient will start the next cycle of lenalidomide in 2 years  Complete blood count as well as chemistry profile is within normal limits. Light chain as well as myeloma profile is pending Patient will start again    POMOLIDOMIDE 2 weeks on and 2 weeks off with Decadron.  Patient will be reevaluated in 5 weeks.  Patient will be keen POMOLIDOMIDE on August 31, 2015  Reevaluation in 4 weeks or before if needed      No matching staging information was found for the patient.  Forest Gleason, MD   09/30/2015 2:53  AM       [place] @ [facility] Telephone:(336) [phone]  Fax:(336) [place] OB: 10-15-1924  MR#: [id number]  UXN#:[id number]  Patient Care Team: Derinda Late, MD as PCP - General (Family Medicine) Forest Gleason, MD (Oncology) Seeplaputhur Robinette Haines, MD (General Surgery)  CHIEF COMPLAINT:  Multiple myeloma on  POMOLIDOMIDE Osteonecrosis of jaw bone pain secondary to biphosphonate therapy with Zometa     No flowsheet data found.  INTERVAL HISTORY:  82 year old lady came today further follow-up for assessment of multiple myeloma. Patient is here for further evaluation regarding multiple myeloma and continuation of treatment with POMOLIDOMIDE and Decadron. Osteonecrosis of jaw bone has been gradually getting better.  Patient did not have any more swelling pain.  No difficulty swallowing.  No bony pain.  Patient continues to have problems maintaining balance but did not have any significant fall.  Here for further follow-up and treatment consideration    REVIEW OF SYSTEMS:   Gen. status: Performance status is 2.  Patient is feeling better.   Appetite is improving.  HEENT: Patient has osteonecrosis of jaw bone.  Complains of sinus congestion.  Sinus drainage. Lungs: Cough yellowish expectoration low-grade fever.  Cardiac: No chest pain .  GI: Appetite is getting better no nausea no vomiting no diarrhea or rectal bleeding.  GU: No dysuria or hematuria.  Neurological system: Continues to no problem with maintaining balance.  No tingling.  No numbness.  Lower extremity no swellings.  Skin: No rash.  No skeletal system joint pains continue to bother patient perstatus is improving. Swelling in the left jaw has been improving. All other systems have been reviewed O significant family history Right hip pain has been stabilized As per HPI. Otherwise, a complete review of systems is negatve.  PAST MEDICAL HISTORY: Past Medical History  Diagnosis Date  . Osteonecrosis of jaw ([place])     left  . Anxiety   . Coronary arteriosclerosis   . Hypertension   . Hyperlipidemia   . Arthritis   . Atrial fibrillation ([place])   . Multiple myeloma ([place])   . Multiple myeloma in relapse ([place]) 10/29/2014  . History of blood transfusion 2014    PAST SURGICAL HISTORY: Past Surgical History  Procedure Laterality Date  . Appendectomy    . Abdominal hysterectomy    . Total knee revision Left   . Partial colectomy    Significant History/PMH:   Multiple Myeloma:    Coronary Artherosclerosis:    Hypertension:    Hyperlipidemia:    arthritis.:    Atrial Fibrillation:    hysterectomy:    left total knee replacement: 18-Mar-2007   Surgery to lift dropped kidney: 1945   D&C:    appendectomy:    Partial colectomy:    FAMILY HISTORY No significant family history of colon cancer breast cancer or ovarian cancer     ADVANCED DIRECTIVES:   Patient does have advance healthcare directive, Patient   does not desire to make any changes Patient did bring power of attorney and living will which has been copied and scanned in the chart HEALTH  MAINTENANCE: Social History  Substance Use Topics  . Smoking status: Never Smoker   . Smokeless tobacco: Never Used  . Alcohol Use: 1.2 oz/week    2 Glasses of wine per week      Allergies  Allergen Reactions  . Biaxin [Clarithromycin] Diarrhea and Nausea And Vomiting  . Demerol [Meperidine] Nausea And Vomiting  . Hydrocodone Nausea  And Vomiting  . Sulfa Antibiotics Hives  . Tramadol Diarrhea and Nausea And Vomiting  . Ultracet [Tramadol-Acetaminophen] Other (See Comments)    Current Outpatient Prescriptions  Medication Sig Dispense Refill  . Biotin 1 MG CAPS Take by mouth.    . dexamethasone (DECADRON) 4 MG tablet Take 5 tablets (20 mg total) by mouth daily. Take on day 1, day 8, and day 15. 15 tablet 0  . ibuprofen (ADVIL,MOTRIN) 800 MG tablet Take 1 tablet (800 mg total) by mouth every 8 (eight) hours as needed for moderate pain. 30 tablet 3  . POMALYST 4 MG capsule Take 1 capsule (4 mg total) by mouth daily. For 2 weeks then 2 weeks off. 14 capsule 0   No current facility-administered medications for this visit.   Facility-Administered Medications Ordered in Other Visits  Medication Dose Route Frequency Provider Last Rate Last Dose  . sodium chloride 0.9 % injection 10 mL  10 mL Intravenous PRN Forest Gleason, MD   10 mL at 03/01/15 1305    OBJECTIVE:  Filed Vitals:   09/28/15 1508  BP: 155/71  Pulse: 81  Temp: 97.7 F (36.5 C)  Resp: 18     Body mass index is 19.94 kg/(m^2).    ECOG FS: 2  PHYSICAL EXAM: GENERAL  status: Performance status is 2  Patient has not lost significant weight Walking with the help of walker. HEENT: No evidence of stomatitis.  No swelling on the left side of the neck.  Osteonecrosis of jaw bone is stable.  No evidence of infection. Redness in the throat has improved.  Open wound in the mouth has healed up.  Sclera and conjunctivae :: No jaundice.   pale looking . Lungs: Occasional rhonchi rhonchi.. Cardiac: Heart sounds are normal.  No  pericardial rub.  No murmur. Lymphatic system: Cervical, axillary, inguinal, lymph nodes not palpable GI: Abdomen is soft.  No ascites.  Liver spleen not palpable.  No tenderness.  Bowel sounds are within normal limit Lower extremity:Patient has ulceration which is traumatic but gradually getting worse.  Redness around it  Neurologically, the patient was awake, alert, and oriented to person, place and time. There were no obvious focal neurologic abnormalities.ct No evidence of peripheral neuropathy. Examination of the skin revealed no evidence of significant rashes, suspicious appearing nevi or other concerning lesions.    CBC Latest Ref Rng 09/28/2015 08/22/2015 07/25/2015  WBC 3.6 - 11.0 K/uL 3.6 3.0(L) 3.3(L)  Hemoglobin 12.0 - 16.0 g/dL 13.2 13.1 12.5  Hematocrit 35.0 - 47.0 % 38.9 38.9 37.3  Platelets 150 - 440 K/uL 278 317 154                 ASSESSMENT: Multiple myeloma in relapse. On pomalidomide and steroid Clinically  patient is responding to the treatment All lab data has been reviewed   3.  Osteonecrosis of jaw bone stable, actually improving  Acute bronchitis has resolved. MYELOsuppression without any fever. Patient will start the next cycle of lenalidomide in 2 years  Complete blood count as well as chemistry profile is within normal limits. Light chain as well as myeloma profile is pending Patient will start again    POMOLIDOMIDE 2 weeks on and 2 weeks off with Decadron. Continue POMLIDOMIDE  LIGHT CHAINS  ARE  stable . multiple myeloma panel pending.  All lab data has been reviewed.  Forest Gleason, MD   09/30/2015 2:53 AM

## 2015-10-02 LAB — MULTIPLE MYELOMA PANEL, SERUM
ALBUMIN SERPL ELPH-MCNC: 3.5 g/dL (ref 2.9–4.4)
ALPHA2 GLOB SERPL ELPH-MCNC: 0.7 g/dL (ref 0.4–1.0)
Albumin/Glob SerPl: 1.4 (ref 0.7–1.7)
Alpha 1: 0.2 g/dL (ref 0.0–0.4)
B-GLOBULIN SERPL ELPH-MCNC: 1 g/dL (ref 0.7–1.3)
Gamma Glob SerPl Elph-Mcnc: 0.7 g/dL (ref 0.4–1.8)
Globulin, Total: 2.6 g/dL (ref 2.2–3.9)
IGG (IMMUNOGLOBIN G), SERUM: 771 mg/dL (ref 700–1600)
IGM, SERUM: 67 mg/dL (ref 26–217)
IgA: 58 mg/dL — ABNORMAL LOW (ref 64–422)
M PROTEIN SERPL ELPH-MCNC: 0.4 g/dL — AB
TOTAL PROTEIN ELP: 6.1 g/dL (ref 6.0–8.5)

## 2015-10-04 ENCOUNTER — Telehealth: Payer: Self-pay | Admitting: *Deleted

## 2015-10-04 MED ORDER — AZITHROMYCIN 500 MG PO TABS: 500.0000 mg | ORAL_TABLET | Freq: Every day | ORAL | Status: DC

## 2015-10-04 MED ORDER — DEXTROMETHORPHAN HBR 15 MG/5ML PO SYRP: 10.0000 mL | ORAL_SOLUTION | Freq: Four times a day (QID) | ORAL | Status: DC | PRN

## 2015-10-04 NOTE — Telephone Encounter (Signed)
Called to request med to be faxed in the pharmacy for a cough she has had for 2 - 3 weeks and is bringing up a yellow orange mucous. Denies fever

## 2015-10-04 NOTE — Telephone Encounter (Signed)
Per Dr Oliva Bustard tussin and azitromycin e scribed, pt informed

## 2015-10-17 ENCOUNTER — Inpatient Hospital Stay: Payer: Medicare Other | Attending: Oncology

## 2015-10-17 DIAGNOSIS — E785 Hyperlipidemia, unspecified: Secondary | ICD-10-CM | POA: Insufficient documentation

## 2015-10-17 DIAGNOSIS — Z9221 Personal history of antineoplastic chemotherapy: Secondary | ICD-10-CM | POA: Diagnosis not present

## 2015-10-17 DIAGNOSIS — L97829 Non-pressure chronic ulcer of other part of left lower leg with unspecified severity: Secondary | ICD-10-CM | POA: Diagnosis not present

## 2015-10-17 DIAGNOSIS — M87180 Osteonecrosis due to drugs, jaw: Secondary | ICD-10-CM | POA: Diagnosis not present

## 2015-10-17 DIAGNOSIS — I10 Essential (primary) hypertension: Secondary | ICD-10-CM | POA: Insufficient documentation

## 2015-10-17 DIAGNOSIS — J069 Acute upper respiratory infection, unspecified: Secondary | ICD-10-CM | POA: Diagnosis not present

## 2015-10-17 DIAGNOSIS — C9002 Multiple myeloma in relapse: Secondary | ICD-10-CM | POA: Insufficient documentation

## 2015-10-17 DIAGNOSIS — L97819 Non-pressure chronic ulcer of other part of right lower leg with unspecified severity: Secondary | ICD-10-CM | POA: Diagnosis not present

## 2015-10-17 DIAGNOSIS — Z452 Encounter for adjustment and management of vascular access device: Secondary | ICD-10-CM | POA: Insufficient documentation

## 2015-10-17 DIAGNOSIS — Z79899 Other long term (current) drug therapy: Secondary | ICD-10-CM | POA: Insufficient documentation

## 2015-10-17 DIAGNOSIS — I4891 Unspecified atrial fibrillation: Secondary | ICD-10-CM | POA: Insufficient documentation

## 2015-10-17 DIAGNOSIS — I2510 Atherosclerotic heart disease of native coronary artery without angina pectoris: Secondary | ICD-10-CM | POA: Insufficient documentation

## 2015-10-17 DIAGNOSIS — Z95828 Presence of other vascular implants and grafts: Secondary | ICD-10-CM

## 2015-10-17 MED ORDER — SODIUM CHLORIDE 0.9% FLUSH
10.0000 mL | INTRAVENOUS | Status: DC | PRN
  Administered 2015-10-17: 10 mL via INTRAVENOUS
  Filled 2015-10-17: qty 10

## 2015-10-17 MED ORDER — HEPARIN SOD (PORK) LOCK FLUSH 100 UNIT/ML IV SOLN
500.0000 [IU] | Freq: Once | INTRAVENOUS | Status: AC
  Administered 2015-10-17: 500 [IU] via INTRAVENOUS

## 2015-11-01 ENCOUNTER — Encounter: Payer: Self-pay | Admitting: Oncology

## 2015-11-01 ENCOUNTER — Inpatient Hospital Stay (HOSPITAL_BASED_OUTPATIENT_CLINIC_OR_DEPARTMENT_OTHER): Payer: Medicare Other | Admitting: Oncology

## 2015-11-01 ENCOUNTER — Ambulatory Visit: Payer: Medicare Other | Admitting: Oncology

## 2015-11-01 ENCOUNTER — Inpatient Hospital Stay: Payer: Medicare Other

## 2015-11-01 ENCOUNTER — Other Ambulatory Visit: Payer: Medicare Other

## 2015-11-01 VITALS — BP 167/79 | HR 61 | Temp 96.7°F | Resp 18 | Wt 109.0 lb

## 2015-11-01 DIAGNOSIS — I4891 Unspecified atrial fibrillation: Secondary | ICD-10-CM

## 2015-11-01 DIAGNOSIS — M87180 Osteonecrosis due to drugs, jaw: Secondary | ICD-10-CM

## 2015-11-01 DIAGNOSIS — C9002 Multiple myeloma in relapse: Secondary | ICD-10-CM

## 2015-11-01 DIAGNOSIS — J069 Acute upper respiratory infection, unspecified: Secondary | ICD-10-CM

## 2015-11-01 DIAGNOSIS — I2510 Atherosclerotic heart disease of native coronary artery without angina pectoris: Secondary | ICD-10-CM

## 2015-11-01 DIAGNOSIS — L97819 Non-pressure chronic ulcer of other part of right lower leg with unspecified severity: Secondary | ICD-10-CM

## 2015-11-01 DIAGNOSIS — E785 Hyperlipidemia, unspecified: Secondary | ICD-10-CM

## 2015-11-01 DIAGNOSIS — L97829 Non-pressure chronic ulcer of other part of left lower leg with unspecified severity: Secondary | ICD-10-CM | POA: Diagnosis not present

## 2015-11-01 DIAGNOSIS — I10 Essential (primary) hypertension: Secondary | ICD-10-CM

## 2015-11-01 DIAGNOSIS — Z452 Encounter for adjustment and management of vascular access device: Secondary | ICD-10-CM | POA: Diagnosis not present

## 2015-11-01 DIAGNOSIS — Z9221 Personal history of antineoplastic chemotherapy: Secondary | ICD-10-CM

## 2015-11-01 DIAGNOSIS — Z79899 Other long term (current) drug therapy: Secondary | ICD-10-CM

## 2015-11-01 LAB — COMPREHENSIVE METABOLIC PANEL
ALBUMIN: 3.9 g/dL (ref 3.5–5.0)
ALT: 15 U/L (ref 14–54)
AST: 14 U/L — AB (ref 15–41)
Alkaline Phosphatase: 67 U/L (ref 38–126)
Anion gap: 6 (ref 5–15)
BUN: 16 mg/dL (ref 6–20)
CHLORIDE: 104 mmol/L (ref 101–111)
CO2: 28 mmol/L (ref 22–32)
CREATININE: 0.78 mg/dL (ref 0.44–1.00)
Calcium: 8.4 mg/dL — ABNORMAL LOW (ref 8.9–10.3)
GFR calc Af Amer: >60 mL/min (ref >60)
GLUCOSE: 93 mg/dL (ref 65–99)
Potassium: 3.9 mmol/L (ref 3.5–5.1)
Sodium: 138 mmol/L (ref 135–145)
Total Bilirubin: 0.7 mg/dL (ref 0.3–1.2)
Total Protein: 6.6 g/dL (ref 6.5–8.1)

## 2015-11-01 LAB — CBC WITH DIFFERENTIAL/PLATELET
BASOS ABS: 0.1 10*3/uL (ref 0–0.1)
BASOS PCT: 1 %
EOS PCT: 2 %
Eosinophils Absolute: 0.1 10*3/uL (ref 0–0.7)
HEMATOCRIT: 38.9 % (ref 35.0–47.0)
Hemoglobin: 13.2 g/dL (ref 12.0–16.0)
LYMPHS PCT: 15 %
Lymphs Abs: 0.6 10*3/uL — ABNORMAL LOW (ref 1.0–3.6)
MCH: 32.7 pg (ref 26.0–34.0)
MCHC: 33.9 g/dL (ref 32.0–36.0)
MCV: 96.7 fL (ref 80.0–100.0)
Monocytes Absolute: 0.3 10*3/uL (ref 0.2–0.9)
Monocytes Relative: 7 %
NEUTROS ABS: 3.1 10*3/uL (ref 1.4–6.5)
Neutrophils Relative %: 75 %
PLATELETS: 215 10*3/uL (ref 150–440)
RBC: 4.02 MIL/uL (ref 3.80–5.20)
RDW: 15 % — ABNORMAL HIGH (ref 11.5–14.5)
WBC: 4.1 10*3/uL (ref 3.6–11.0)

## 2015-11-02 LAB — KAPPA/LAMBDA LIGHT CHAINS
KAPPA FREE LGHT CHN: 18.81 mg/L (ref 3.30–19.40)
Kappa, lambda light chain ratio: 2.04 — ABNORMAL HIGH (ref 0.26–1.65)
LAMDA FREE LIGHT CHAINS: 9.23 mg/L (ref 5.71–26.30)

## 2015-11-03 LAB — MULTIPLE MYELOMA PANEL, SERUM
ALBUMIN/GLOB SERPL: 1.3 (ref 0.7–1.7)
ALPHA 1: 0.3 g/dL (ref 0.0–0.4)
ALPHA2 GLOB SERPL ELPH-MCNC: 0.7 g/dL (ref 0.4–1.0)
Albumin SerPl Elph-Mcnc: 3.3 g/dL (ref 2.9–4.4)
B-GLOBULIN SERPL ELPH-MCNC: 1 g/dL (ref 0.7–1.3)
GAMMA GLOB SERPL ELPH-MCNC: 0.7 g/dL (ref 0.4–1.8)
GLOBULIN, TOTAL: 2.6 g/dL (ref 2.2–3.9)
IGG (IMMUNOGLOBIN G), SERUM: 674 mg/dL — AB (ref 700–1600)
IgA: 53 mg/dL — ABNORMAL LOW (ref 64–422)
IgM, Serum: 64 mg/dL (ref 26–217)
M PROTEIN SERPL ELPH-MCNC: 0.4 g/dL — AB
Total Protein ELP: 5.9 g/dL — ABNORMAL LOW (ref 6.0–8.5)

## 2015-11-06 ENCOUNTER — Encounter: Payer: Self-pay | Admitting: Oncology

## 2015-11-06 NOTE — Progress Notes (Signed)
Per Dr Oliva Bustard tussin and azitromycin e scribed, pt informed  [place] @ [facility] Telephone:(336) [phone]  Fax:(336) [place] OB: March 26, 1925  MR#: [id number]  IEP#:[id number]  Patient Care Team: Derinda Late, MD as PCP - General (Family Medicine) Forest Gleason, MD (Oncology) Seeplaputhur Robinette Haines, MD (General Surgery)  CHIEF COMPLAINT:  Multiple myeloma on  POMOLIDOMIDE Osteonecrosis of jaw bone pain secondary to biphosphonate therapy with Zometa     No flowsheet data found.  INTERVAL HISTORY:  82 year old lady came today further follow-up for assessment of multiple myeloma. Patient also has nonhealing ulcers in both lower extremity being evaluated by wound care.  Center Patient came as an acute add-on complaining of cough yellowish expectoration and low-grade fever sinus congestion.  Started few days ago. Patient is presently taking pomalidomide or recurrent multiple myeloma Patient is feeling better.  Upper respiratory tract infection is resolved. Myelosuppression without fever CONTINUES  to have problems maintaining balance Center is here for further follow-up regarding multiple myeloma. Taking palpable t  POMOLID  2 weeks on and 2 weeks off Patient is tolerating treatment very well.  Her only pain.  Appetite has been fairly good.  REVIEW OF SYSTEMS:   Gen. status: Performance status is 2.  Patient is feeling better.  Appetite is improving.  HEENT: Patient has osteonecrosis of jaw bone.  Complains of sinus congestion.  Sinus drainage. Lungs: Cough yellowish expectoration low-grade fever.  Cardiac: No chest pain .  GI: Appetite is getting better no nausea no vomiting no diarrhea or rectal bleeding.  GU: No dysuria or hematuria.  Neurological system: Continues to no problem with maintaining balance.  No tingling.  No numbness.  Lower extremity no swellings.  Skin: No rash.  No skeletal system joint pains continue to bother patient perstatus is  improving. Swelling in the left jaw has been All other systems have been reviewed O significant family history  As per HPI. Otherwise, a complete review of systems is negatve.  PAST MEDICAL HISTORY: Past Medical History  Diagnosis Date  . Osteonecrosis of jaw ([place])     left  . Anxiety   . Coronary arteriosclerosis   . Hypertension   . Hyperlipidemia   . Arthritis   . Atrial fibrillation ([place])   . Multiple myeloma ([place])   . Multiple myeloma in relapse ([place]) 10/29/2014  . History of blood transfusion 2014    PAST SURGICAL HISTORY: Past Surgical History  Procedure Laterality Date  . Appendectomy    . Abdominal hysterectomy    . Total knee revision Left   . 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 Patient is postmenopausal.     ADVANCED DIRECTIVES:   Patient does have advance healthcare directive, Patient   does not desire to make any changes Patient did bring power of attorney and living will which has been copied and scanned in the chart HEALTH MAINTENANCE: Social History  Substance Use Topics  . Smoking status: Never Smoker   . Smokeless tobacco: Never Used  .  Alcohol Use: 1.2 oz/week    2 Glasses of wine per week      Allergies  Allergen Reactions  . Biaxin [Clarithromycin] Diarrhea and Nausea And Vomiting  . Demerol [Meperidine] Nausea And Vomiting  . Hydrocodone Nausea And Vomiting  . Sulfa Antibiotics Hives  . Tramadol Diarrhea and Nausea And Vomiting  . Ultracet [Tramadol-Acetaminophen] Other (See Comments)    Current  Outpatient Prescriptions  Medication Sig Dispense Refill  . Biotin 1 MG CAPS Take by mouth.    . dexamethasone (DECADRON) 4 MG tablet Take 5 tablets (20 mg total) by mouth daily. Take on day 1, day 8, and day 15. 15 tablet 0  . dextromethorphan 15 MG/5ML syrup Take 10 mLs (30 mg total) by mouth 4 (four) times daily as needed for cough. 120 mL 0  . ibuprofen (ADVIL,MOTRIN) 800 MG tablet Take 1 tablet (800 mg total) by mouth every 8 (eight) hours as needed for moderate pain. 30 tablet 3  . POMALYST 4 MG capsule Take 1 capsule (4 mg total) by mouth daily. For 2 weeks then 2 weeks off. 14 capsule 0   No current facility-administered medications for this visit.   Facility-Administered Medications Ordered in Other Visits  Medication Dose Route Frequency Provider Last Rate Last Dose  . sodium chloride 0.9 % injection 10 mL  10 mL Intravenous PRN Forest Gleason, MD   10 mL at 03/01/15 1305    OBJECTIVE:  Filed Vitals:   11/01/15 1131  BP: 167/79  Pulse: 61  Temp: 96.7 F (35.9 C)  Resp: 18     Body mass index is 19.93 kg/(m^2).    ECOG FS: 2  PHYSICAL EXAM: GENERAL  status: Performance status is 2  Patient has not lost significant weight Walking with the help of walker. HEENT: No evidence of stomatitis.  No swelling on the left side of the neck.  Osteonecrosis of jaw bone is stable.  No evidence of infection. Redness in the throat  Sclera and conjunctivae :: No jaundice.   pale looking . Lungs: Occasional rhonchi rhonchi.. Cardiac: Heart sounds are normal.  No pericardial rub.  No murmur. Lymphatic system: Cervical, axillary, inguinal, lymph nodes not palpable GI: Abdomen is soft.  No ascites.  Liver spleen not palpable.  No tenderness.  Bowel sounds are within normal limit Lower extremity:Patient has ulceration which is traumatic but gradually getting worse.  Redness around it  Neurologically, the patient was awake, alert, and oriented to person, place and time. There were no obvious  focal neurologic abnormalities.ct No evidence of peripheral neuropathy. Examination of the skin revealed no evidence of significant rashes, suspicious appearing nevi or other concerning lesions.    CBC Latest Ref Rng 11/01/2015 09/28/2015 08/22/2015  WBC 3.6 - 11.0 K/uL 4.1 3.6 3.0(L)  Hemoglobin 12.0 - 16.0 g/dL 13.2 13.2 13.1  Hematocrit 35.0 - 47.0 % 38.9 38.9 38.9  Platelets 150 - 440 K/uL 215 278 317                 ASSESSMENT: Multiple myeloma in relapse. On pomalidomide and steroid Clinically  patient is responding to the treatment All lab data has been reviewed Myeloma panel and carpi lambda light chain pending. Reevaluation in 4 weeks.  3.  Osteonecrosis of jaw bone stable, actually improving  All lab data has been reviewed.  M protein is 0.4. LIGHT  chains are stable. Patient will start the next cycle of lenalidomide in 2 years Continue POMOLIDOMIDE 2 weeks on and 2 weeks  off   Reevaluation in 4 weeks or before if needed      No matching staging information was found for the patient.  Forest Gleason, MD   11/06/2015 4:07 PM

## 2015-11-29 ENCOUNTER — Inpatient Hospital Stay: Payer: Medicare Other

## 2015-11-29 ENCOUNTER — Inpatient Hospital Stay: Payer: Medicare Other | Attending: Internal Medicine

## 2015-11-29 ENCOUNTER — Inpatient Hospital Stay: Payer: Medicare Other | Admitting: Internal Medicine

## 2015-11-29 ENCOUNTER — Inpatient Hospital Stay (HOSPITAL_BASED_OUTPATIENT_CLINIC_OR_DEPARTMENT_OTHER): Payer: Medicare Other | Admitting: Internal Medicine

## 2015-11-29 VITALS — BP 162/78 | HR 53 | Temp 97.0°F | Resp 18

## 2015-11-29 DIAGNOSIS — I10 Essential (primary) hypertension: Secondary | ICD-10-CM | POA: Diagnosis not present

## 2015-11-29 DIAGNOSIS — Z79899 Other long term (current) drug therapy: Secondary | ICD-10-CM

## 2015-11-29 DIAGNOSIS — M129 Arthropathy, unspecified: Secondary | ICD-10-CM

## 2015-11-29 DIAGNOSIS — Z923 Personal history of irradiation: Secondary | ICD-10-CM | POA: Insufficient documentation

## 2015-11-29 DIAGNOSIS — E785 Hyperlipidemia, unspecified: Secondary | ICD-10-CM

## 2015-11-29 DIAGNOSIS — C9000 Multiple myeloma not having achieved remission: Secondary | ICD-10-CM | POA: Diagnosis not present

## 2015-11-29 DIAGNOSIS — C9002 Multiple myeloma in relapse: Secondary | ICD-10-CM

## 2015-11-29 DIAGNOSIS — I4891 Unspecified atrial fibrillation: Secondary | ICD-10-CM

## 2015-11-29 DIAGNOSIS — F419 Anxiety disorder, unspecified: Secondary | ICD-10-CM

## 2015-11-29 DIAGNOSIS — M879 Osteonecrosis, unspecified: Secondary | ICD-10-CM

## 2015-11-29 DIAGNOSIS — I2510 Atherosclerotic heart disease of native coronary artery without angina pectoris: Secondary | ICD-10-CM | POA: Insufficient documentation

## 2015-11-29 LAB — CBC WITH DIFFERENTIAL/PLATELET
BASOS PCT: 1 %
Basophils Absolute: 0 10*3/uL (ref 0–0.1)
EOS ABS: 0.1 10*3/uL (ref 0–0.7)
EOS PCT: 2 %
HCT: 38.1 % (ref 35.0–47.0)
HEMOGLOBIN: 12.9 g/dL (ref 12.0–16.0)
LYMPHS ABS: 0.6 10*3/uL — AB (ref 1.0–3.6)
Lymphocytes Relative: 16 %
MCH: 32.5 pg (ref 26.0–34.0)
MCHC: 33.8 g/dL (ref 32.0–36.0)
MCV: 96.2 fL (ref 80.0–100.0)
MONO ABS: 1 10*3/uL — AB (ref 0.2–0.9)
MONOS PCT: 24 %
NEUTROS PCT: 57 %
Neutro Abs: 2.3 10*3/uL (ref 1.4–6.5)
PLATELETS: 186 10*3/uL (ref 150–440)
RBC: 3.96 MIL/uL (ref 3.80–5.20)
RDW: 14.8 % — AB (ref 11.5–14.5)
WBC: 4.1 10*3/uL (ref 3.6–11.0)

## 2015-11-29 LAB — COMPREHENSIVE METABOLIC PANEL
ALBUMIN: 3.9 g/dL (ref 3.5–5.0)
ALK PHOS: 60 U/L (ref 38–126)
ALT: 13 U/L — ABNORMAL LOW (ref 14–54)
ANION GAP: 4 — AB (ref 5–15)
AST: 14 U/L — ABNORMAL LOW (ref 15–41)
BUN: 19 mg/dL (ref 6–20)
CALCIUM: 8.2 mg/dL — AB (ref 8.9–10.3)
CO2: 28 mmol/L (ref 22–32)
Chloride: 105 mmol/L (ref 101–111)
Creatinine, Ser: 0.79 mg/dL (ref 0.44–1.00)
GFR calc non Af Amer: >60 mL/min (ref >60)
GLUCOSE: 88 mg/dL (ref 65–99)
POTASSIUM: 4.4 mmol/L (ref 3.5–5.1)
SODIUM: 137 mmol/L (ref 135–145)
TOTAL PROTEIN: 6.6 g/dL (ref 6.5–8.1)
Total Bilirubin: 0.8 mg/dL (ref 0.3–1.2)

## 2015-11-29 MED ORDER — POMALYST 4 MG PO CAPS: 4.0000 mg | ORAL_CAPSULE | Freq: Every day | ORAL | Status: DC

## 2015-11-29 NOTE — Assessment & Plan Note (Signed)
Multiple myeloma relapsed currently on pomolyst 4 mg 2 weeks on 2 week off- patient seems to be tolerating well without any major side effects. Most recent myeloma numbers in May 2017 M protein 0.4 g; kappa lambda light chain ratio slightly abnormal at 2. Normal CBC CMP.  # Continue pomolyst at the current dose. She seems to be tolerating it well. New prescription given for the same.  # History of osteonecrosis of the jaw- not on Zometa.  # Follow-up in 4 weeks with labs. Myeloma numbers today pending.

## 2015-11-29 NOTE — Progress Notes (Signed)
Patient is coming in for follow up  

## 2015-11-29 NOTE — Progress Notes (Signed)
[facility] OFFICE PROGRESS NOTE  Patient Care Team: Derinda Late, MD as PCP - General (Family Medicine) Forest Gleason, MD (Oncology) Seeplaputhur Robinette Haines, MD (General Surgery)  No matching staging information was found for the patient.   Oncology History   1. Stage III Multiple Myeloma a. 11/20/09: SPEP showed quantitative IgG 4567 mg/dL with M spike of 3.8 g/dL and IFE showing IgG monoclonal protein with light chain specificity.  Ramdom urine protein electrophoresis showed M spike 27% Bence Jones Protein.  a. 11/28/09: bone marrow biopsy: - PLASMA CELL DYSCRASIA CONSISTENT WITH MULTIPLE MYELOMA (55%PLASMA CELLS ON ASPIRATE, 75% BY CD138 IMMUNOHISTOCHEMISTRY,KAPPA RESTRICTED).TRILINEAGE HEMATOPOIESIS.46 XX,  Normal FISH panel. b. 11/29/09: bone survey: There are numerous, scattered 2 mm to 5 mm concentric  radiolucencies in the bony calvaria.   Two or three similar focal lucent areas in the right  femur.  There are observed changes consistent with disc disease at multiple levels of the cervical spine and multiple levels of the lumbar spine. c. 12/07/09: right humerus: Multiple tiny lucencies noted throughout the right humerus including humeral diaphysis. No fracture. d. 8/11- 4/12 Mephalan/prednisone/velcade X 6 cycles with decrease in Mspike to 1.0 gm/dl. e.osteonecrosis of jaw and diagnosed in December of 2013(Zometa was discontinued) 7.Patient was started on Velcade and Decadron   because of progressive multiple myeloma from January of 2013. 8. Started on Kyprolis 05/13 9.progressing disease on kyphorilis September of 2013 10 patient has been started on [name] daily for 21 days and one week off Decadron day1, 18 and 15   with 4 week cycle is finished palliative radiation therapy without much relief in pain(November, 2013) 11.Palliative radiation therapy to the right shoulder (December, 2014) 12 patient was on break from pomolidomide because of infection and it has  been presumed from December 05, 2014     Multiple myeloma in relapse [facility])   11/20/2009 Initial Diagnosis Multiple myeloma in relapse     INTERVAL HISTORY:  [name] 82 y.o.  [name] pleasant patient above history of Multiple myeloma currently on pomolyst is here for follow-up.  Patient denies any pain. Appetite is good. No nausea no vomiting. No skin rash. No diarrhea. No chest pain or shortness of breath or cough. Denies any tingling or numbness.  REVIEW OF SYSTEMS:  A complete 10 point review of system is done which is negative except mentioned above/history of present illness.   PAST MEDICAL HISTORY :  Past Medical History  Diagnosis Date  . Osteonecrosis of jaw ([place])     left  . Anxiety   . Coronary arteriosclerosis   . Hypertension   . Hyperlipidemia   . Arthritis   . Atrial fibrillation ([place])   . Multiple myeloma ([place])   . Multiple myeloma in relapse ([place]) 10/29/2014  . History of blood transfusion 2014    PAST SURGICAL HISTORY :   Past Surgical History  Procedure Laterality Date  . Appendectomy    . Abdominal hysterectomy    . Total knee revision Left   . Partial colectomy      FAMILY HISTORY :   Family History  Problem Relation Age of Onset  . Cancer Father   . Heart attack Mother     SOCIAL HISTORY:   Social History  Substance Use Topics  . Smoking status: Never Smoker   . Smokeless tobacco: Never Used  . Alcohol Use: 1.2 oz/week    2 Glasses of wine per week    ALLERGIES:  is allergic to biaxin;  demerol; hydrocodone; sulfa antibiotics; tramadol; and ultracet.  MEDICATIONS:  Current Outpatient Prescriptions  Medication Sig Dispense Refill  . Biotin 1 MG CAPS Take by mouth.    . dexamethasone (DECADRON) 4 MG tablet Take 5 tablets (20 mg total) by mouth daily. Take on day 1, day 8, and day 15. (Patient not taking: Reported on 11/29/2015) 15 tablet 0  . dextromethorphan 15 MG/5ML syrup Take 10 mLs (30 mg total) by mouth 4 (four) times daily as  needed for cough. (Patient not taking: Reported on 11/29/2015) 120 mL 0  . ibuprofen (ADVIL,MOTRIN) 800 MG tablet Take 1 tablet (800 mg total) by mouth every 8 (eight) hours as needed for moderate pain. (Patient not taking: Reported on 11/29/2015) 30 tablet 3  . POMALYST 4 MG capsule Take 1 capsule (4 mg total) by mouth daily. For 2 weeks then 2 weeks off. 14 capsule 0   No current facility-administered medications for this visit.   Facility-Administered Medications Ordered in Other Visits  Medication Dose Route Frequency Provider Last Rate Last Dose  . sodium chloride 0.9 % injection 10 mL  10 mL Intravenous PRN Forest Gleason, MD   10 mL at 03/01/15 1305    PHYSICAL EXAMINATION: ECOG PERFORMANCE STATUS: 0 - Asymptomatic  BP 162/78 mmHg  Pulse 53  Temp(Src) 97 F (36.1 C) (Tympanic)  Resp 18  There were no vitals filed for this visit.  GENERAL: Well-nourished well-developed; Alert, no distress and comfortable. Accompanied by her daughter. Patient is a wheelchair. EYES: no pallor or icterus OROPHARYNX: no thrush or ulceration; good dentition  NECK: supple, no masses felt LYMPH:  no palpable lymphadenopathy in the cervical, axillary or inguinal regions LUNGS: clear to auscultation and  No wheeze or crackles HEART/CVS: regular rate & rhythm and no murmurs; No lower extremity edema ABDOMEN:abdomen soft, non-tender and normal bowel sounds Musculoskeletal:no cyanosis of digits and no clubbing  PSYCH: alert & oriented x 3 with fluent speech NEURO: no focal motor/sensory deficits SKIN:  no rashes or significant lesions  LABORATORY DATA:  I have reviewed the data as listed    Component Value Date/Time   NA 137 11/29/2015 1053   NA 138 09/19/2014 1118   K 4.4 11/29/2015 1053   K 4.0 09/19/2014 1118   CL 105 11/29/2015 1053   CL 103 09/19/2014 1118   CO2 28 11/29/2015 1053   CO2 29 09/19/2014 1118   GLUCOSE 88 11/29/2015 1053   GLUCOSE 91 09/19/2014 1118   BUN 19 11/29/2015 1053    BUN 15 09/19/2014 1118   CREATININE 0.79 11/29/2015 1053   CREATININE 0.69 09/19/2014 1118   CALCIUM 8.2* 11/29/2015 1053   CALCIUM 9.0 09/19/2014 1118   PROT 6.6 11/29/2015 1053   PROT 6.8 09/19/2014 1118   ALBUMIN 3.9 11/29/2015 1053   ALBUMIN 3.9 09/19/2014 1118   AST 14* 11/29/2015 1053   AST 20 09/19/2014 1118   ALT 13* 11/29/2015 1053   ALT 16 09/19/2014 1118   ALKPHOS 60 11/29/2015 1053   ALKPHOS 85 09/19/2014 1118   BILITOT 0.8 11/29/2015 1053   BILITOT 0.6 09/19/2014 1118   GFRNONAA >60 11/29/2015 1053   GFRNONAA >60 09/19/2014 1118   GFRNONAA 58* 06/23/2014 1028   GFRAA >60 11/29/2015 1053   GFRAA >60 09/19/2014 1118   GFRAA >60 06/23/2014 1028    No results found for: SPEP, UPEP  Lab Results  Component Value Date   WBC 4.1 11/29/2015   NEUTROABS 2.3 11/29/2015   HGB 12.9 11/29/2015  HCT 38.1 11/29/2015   MCV 96.2 11/29/2015   PLT 186 11/29/2015      Chemistry      Component Value Date/Time   NA 137 11/29/2015 1053   NA 138 09/19/2014 1118   K 4.4 11/29/2015 1053   K 4.0 09/19/2014 1118   CL 105 11/29/2015 1053   CL 103 09/19/2014 1118   CO2 28 11/29/2015 1053   CO2 29 09/19/2014 1118   BUN 19 11/29/2015 1053   BUN 15 09/19/2014 1118   CREATININE 0.79 11/29/2015 1053   CREATININE 0.69 09/19/2014 1118      Component Value Date/Time   CALCIUM 8.2* 11/29/2015 1053   CALCIUM 9.0 09/19/2014 1118   ALKPHOS 60 11/29/2015 1053   ALKPHOS 85 09/19/2014 1118   AST 14* 11/29/2015 1053   AST 20 09/19/2014 1118   ALT 13* 11/29/2015 1053   ALT 16 09/19/2014 1118   BILITOT 0.8 11/29/2015 1053   BILITOT 0.6 09/19/2014 1118       RADIOGRAPHIC STUDIES: I have personally reviewed the radiological images as listed and agreed with the findings in the report. No results found.   ASSESSMENT & PLAN:  Multiple myeloma in relapse ([place]) Multiple myeloma relapsed currently on pomolyst 4 mg 2 weeks on 2 week off- patient seems to be tolerating well without any  major side effects. Most recent myeloma numbers in May 2017 M protein 0.4 g; kappa lambda light chain ratio slightly abnormal at 2. Normal CBC CMP.  # Continue pomolyst at the current dose. She seems to be tolerating it well. New prescription given for the same.  # History of osteonecrosis of the jaw- not on Zometa.  # Follow-up in 4 weeks with labs. Myeloma numbers today pending.    Orders Placed This Encounter  Procedures  . CBC with Differential/Platelet    Standing Status: Future     Number of Occurrences:      Standing Expiration Date: 01/02/2017  . Comprehensive metabolic panel    Standing Status: Future     Number of Occurrences:      Standing Expiration Date: 01/02/2017  . Multiple Myeloma Panel (SPEP&IFE w/QIG)    Standing Status: Future     Number of Occurrences:      Standing Expiration Date: 01/02/2017  . Kappa/lambda light chains    Standing Status: Future     Number of Occurrences:      Standing Expiration Date: 01/02/2017   All questions were answered. The patient knows to call the clinic with any problems, questions or concerns.      Cammie Sickle, MD 11/29/2015 6:04 PM

## 2015-11-30 LAB — KAPPA/LAMBDA LIGHT CHAINS
Kappa free light chain: 19.8 mg/L — ABNORMAL HIGH (ref 3.3–19.4)
Kappa, lambda light chain ratio: 2.41 — ABNORMAL HIGH (ref 0.26–1.65)
Lambda free light chains: 8.2 mg/L (ref 5.7–26.3)

## 2015-12-01 LAB — MULTIPLE MYELOMA PANEL, SERUM
ALBUMIN/GLOB SERPL: 1.4 (ref 0.7–1.7)
ALPHA 1: 0.2 g/dL (ref 0.0–0.4)
Albumin SerPl Elph-Mcnc: 3.4 g/dL (ref 2.9–4.4)
Alpha2 Glob SerPl Elph-Mcnc: 0.7 g/dL (ref 0.4–1.0)
B-Globulin SerPl Elph-Mcnc: 1 g/dL (ref 0.7–1.3)
Gamma Glob SerPl Elph-Mcnc: 0.7 g/dL (ref 0.4–1.8)
Globulin, Total: 2.6 g/dL (ref 2.2–3.9)
IGA: 28 mg/dL — AB (ref 64–422)
IGM, SERUM: 53 mg/dL (ref 26–217)
IgG (Immunoglobin G), Serum: 653 mg/dL — ABNORMAL LOW (ref 700–1600)
M Protein SerPl Elph-Mcnc: 0.4 g/dL — ABNORMAL HIGH
Total Protein ELP: 6 g/dL (ref 6.0–8.5)

## 2015-12-26 ENCOUNTER — Other Ambulatory Visit: Payer: Self-pay | Admitting: *Deleted

## 2015-12-26 DIAGNOSIS — C9002 Multiple myeloma in relapse: Secondary | ICD-10-CM

## 2015-12-26 MED ORDER — POMALYST 4 MG PO CAPS: 4.0000 mg | ORAL_CAPSULE | Freq: Every day | ORAL | Status: DC

## 2015-12-27 ENCOUNTER — Other Ambulatory Visit: Payer: Medicare Other

## 2015-12-27 ENCOUNTER — Ambulatory Visit: Payer: Medicare Other | Admitting: Internal Medicine

## 2015-12-28 ENCOUNTER — Ambulatory Visit
Admission: RE | Admit: 2015-12-28 | Discharge: 2015-12-28 | Disposition: A | Discharge status: Home / Self Care | Payer: Medicare Other | Source: Ambulatory Visit | Attending: Internal Medicine | Admitting: Internal Medicine

## 2015-12-28 ENCOUNTER — Inpatient Hospital Stay: Payer: Medicare Other | Attending: Internal Medicine

## 2015-12-28 ENCOUNTER — Inpatient Hospital Stay (HOSPITAL_BASED_OUTPATIENT_CLINIC_OR_DEPARTMENT_OTHER): Payer: Medicare Other | Admitting: Internal Medicine

## 2015-12-28 ENCOUNTER — Telehealth: Payer: Self-pay | Admitting: Internal Medicine

## 2015-12-28 VITALS — BP 163/87 | HR 60 | Temp 97.4°F | Resp 18 | Wt 109.2 lb

## 2015-12-28 DIAGNOSIS — Z809 Family history of malignant neoplasm, unspecified: Secondary | ICD-10-CM | POA: Diagnosis not present

## 2015-12-28 DIAGNOSIS — Z9221 Personal history of antineoplastic chemotherapy: Secondary | ICD-10-CM

## 2015-12-28 DIAGNOSIS — M40209 Unspecified kyphosis, site unspecified: Secondary | ICD-10-CM | POA: Diagnosis not present

## 2015-12-28 DIAGNOSIS — C9002 Multiple myeloma in relapse: Secondary | ICD-10-CM | POA: Diagnosis not present

## 2015-12-28 DIAGNOSIS — I10 Essential (primary) hypertension: Secondary | ICD-10-CM | POA: Insufficient documentation

## 2015-12-28 DIAGNOSIS — M129 Arthropathy, unspecified: Secondary | ICD-10-CM

## 2015-12-28 DIAGNOSIS — E785 Hyperlipidemia, unspecified: Secondary | ICD-10-CM | POA: Insufficient documentation

## 2015-12-28 DIAGNOSIS — Z923 Personal history of irradiation: Secondary | ICD-10-CM

## 2015-12-28 DIAGNOSIS — Z79899 Other long term (current) drug therapy: Secondary | ICD-10-CM | POA: Diagnosis not present

## 2015-12-28 DIAGNOSIS — M545 Low back pain, unspecified: Secondary | ICD-10-CM

## 2015-12-28 DIAGNOSIS — I4891 Unspecified atrial fibrillation: Secondary | ICD-10-CM

## 2015-12-28 DIAGNOSIS — F419 Anxiety disorder, unspecified: Secondary | ICD-10-CM

## 2015-12-28 DIAGNOSIS — I2510 Atherosclerotic heart disease of native coronary artery without angina pectoris: Secondary | ICD-10-CM | POA: Diagnosis not present

## 2015-12-28 DIAGNOSIS — M25551 Pain in right hip: Secondary | ICD-10-CM | POA: Insufficient documentation

## 2015-12-28 DIAGNOSIS — M8580 Other specified disorders of bone density and structure, unspecified site: Secondary | ICD-10-CM | POA: Insufficient documentation

## 2015-12-28 DIAGNOSIS — M8788 Other osteonecrosis, other site: Secondary | ICD-10-CM | POA: Diagnosis not present

## 2015-12-28 LAB — CBC WITH DIFFERENTIAL/PLATELET
BASOS ABS: 0.1 10*3/uL (ref 0–0.1)
BASOS PCT: 4 %
EOS PCT: 2 %
Eosinophils Absolute: 0.1 10*3/uL (ref 0–0.7)
HEMATOCRIT: 38.5 % (ref 35.0–47.0)
Hemoglobin: 13.1 g/dL (ref 12.0–16.0)
LYMPHS PCT: 18 %
Lymphs Abs: 0.7 10*3/uL — ABNORMAL LOW (ref 1.0–3.6)
MCH: 32.4 pg (ref 26.0–34.0)
MCHC: 33.9 g/dL (ref 32.0–36.0)
MCV: 95.7 fL (ref 80.0–100.0)
MONO ABS: 1 10*3/uL — AB (ref 0.2–0.9)
Monocytes Relative: 26 %
NEUTROS ABS: 1.9 10*3/uL (ref 1.4–6.5)
NEUTROS PCT: 50 %
PLATELETS: 224 10*3/uL (ref 150–440)
RBC: 4.03 MIL/uL (ref 3.80–5.20)
RDW: 15.2 % — AB (ref 11.5–14.5)
WBC: 3.8 10*3/uL (ref 3.6–11.0)

## 2015-12-28 LAB — COMPREHENSIVE METABOLIC PANEL
ALBUMIN: 3.9 g/dL (ref 3.5–5.0)
ALT: 11 U/L — AB (ref 14–54)
AST: 11 U/L — AB (ref 15–41)
Alkaline Phosphatase: 67 U/L (ref 38–126)
Anion gap: 7 (ref 5–15)
BUN: 18 mg/dL (ref 6–20)
CHLORIDE: 103 mmol/L (ref 101–111)
CO2: 26 mmol/L (ref 22–32)
CREATININE: 0.79 mg/dL (ref 0.44–1.00)
Calcium: 8.9 mg/dL (ref 8.9–10.3)
GFR calc Af Amer: >60 mL/min (ref >60)
GFR calc non Af Amer: >60 mL/min (ref >60)
GLUCOSE: 96 mg/dL (ref 65–99)
POTASSIUM: 4 mmol/L (ref 3.5–5.1)
Sodium: 136 mmol/L (ref 135–145)
Total Bilirubin: 0.9 mg/dL (ref 0.3–1.2)
Total Protein: 6.8 g/dL (ref 6.5–8.1)

## 2015-12-28 NOTE — Telephone Encounter (Signed)
Heather please inform patient that her x-ray showed old spots of multiple myeloma; given her worsening pain; recommend referred to orthopedic. Thx

## 2015-12-28 NOTE — Progress Notes (Signed)
[facility] OFFICE PROGRESS NOTE  Patient Care Team: Derinda Late, MD as PCP - General (Family Medicine) Forest Gleason, MD (Oncology) Seeplaputhur Robinette Haines, MD (General Surgery)  No matching staging information was found for the patient.   Oncology History   1. Stage III Multiple Myeloma a. 11/20/09: SPEP showed quantitative IgG 4567 mg/dL with M spike of 3.8 g/dL and IFE showing IgG monoclonal protein with light chain specificity.  Ramdom urine protein electrophoresis showed M spike 27% Bence Jones Protein.  a. 11/28/09: bone marrow biopsy: - PLASMA CELL DYSCRASIA CONSISTENT WITH MULTIPLE MYELOMA (55%PLASMA CELLS ON ASPIRATE, 75% BY CD138 IMMUNOHISTOCHEMISTRY,KAPPA RESTRICTED).TRILINEAGE HEMATOPOIESIS.46 XX,  Normal FISH panel. b. 11/29/09: bone survey: There are numerous, scattered 2 mm to 5 mm concentric  radiolucencies in the bony calvaria.   Two or three similar focal lucent areas in the right  femur.  There are observed changes consistent with disc disease at multiple levels of the cervical spine and multiple levels of the lumbar spine. c. 12/07/09: right humerus: Multiple tiny lucencies noted throughout the right humerus including humeral diaphysis. No fracture. d. 8/11- 4/12 Mephalan/prednisone/velcade X 6 cycles with decrease in Mspike to 1.0 gm/dl. e.osteonecrosis of jaw and diagnosed in December of 2013(Zometa was discontinued) 7.Patient was started on Velcade and Decadron   because of progressive multiple myeloma from January of 2013. 8. Started on Kyprolis 05/13 9.progressing disease on kyphorilis September of 2013 10 patient has been started on [name] daily for 21 days and one week off Decadron day1, 18 and 15   with 4 week cycle is finished palliative radiation therapy without much relief in pain(November, 2013) 11.Palliative radiation therapy to the right shoulder (December, 2014) 12 patient was on break from pomolidomide because of infection and it has  been presumed from December 05, 2014     Multiple myeloma in relapse [facility])   11/20/2009 Initial Diagnosis Multiple myeloma in relapse     INTERVAL HISTORY:  [name] 82 y.o.  female pleasant patient above history of Multiple myeloma currently on pomolyst is here for follow-up.  Patient noted to have slight worsening pain in the last few months in the right hip and also in lower back. Patient has not been taking any pain medications.  Appetite is good. No nausea no vomiting. No skin rash. No diarrhea. No chest pain or shortness of breath or cough. Denies any tingling or numbness.  REVIEW OF SYSTEMS:  A complete 10 point review of system is done which is negative except mentioned above/history of present illness.   PAST MEDICAL HISTORY :  Past Medical History  Diagnosis Date  . Osteonecrosis of jaw ([place])     left  . Anxiety   . Coronary arteriosclerosis   . Hypertension   . Hyperlipidemia   . Arthritis   . Atrial fibrillation ([place])   . Multiple myeloma ([place])   . Multiple myeloma in relapse ([place]) 10/29/2014  . History of blood transfusion 2014    PAST SURGICAL HISTORY :   Past Surgical History  Procedure Laterality Date  . Appendectomy    . Abdominal hysterectomy    . Total knee revision Left   . Partial colectomy      FAMILY HISTORY :   Family History  Problem Relation Age of Onset  . Cancer Father   . Heart attack Mother     SOCIAL HISTORY:   Social History  Substance Use Topics  . Smoking status: Never Smoker   . Smokeless tobacco:  Never Used  . Alcohol Use: 1.2 oz/week    2 Glasses of wine per week    ALLERGIES:  is allergic to biaxin; demerol; hydrocodone; sulfa antibiotics; tramadol; and ultracet.  MEDICATIONS:  Current Outpatient Prescriptions  Medication Sig Dispense Refill  . Biotin 1 MG CAPS Take by mouth.    . dexamethasone (DECADRON) 4 MG tablet Take 5 tablets (20 mg total) by mouth daily. Take on day 1, day 8, and day 15. 15 tablet 0  .  dextromethorphan 15 MG/5ML syrup Take 10 mLs (30 mg total) by mouth 4 (four) times daily as needed for cough. 120 mL 0  . ibuprofen (ADVIL,MOTRIN) 800 MG tablet Take 1 tablet (800 mg total) by mouth every 8 (eight) hours as needed for moderate pain. 30 tablet 3  . POMALYST 4 MG capsule Take 1 capsule (4 mg total) by mouth daily. For 2 weeks then 2 weeks off. 14 capsule 0   No current facility-administered medications for this visit.   Facility-Administered Medications Ordered in Other Visits  Medication Dose Route Frequency Provider Last Rate Last Dose  . sodium chloride 0.9 % injection 10 mL  10 mL Intravenous PRN Forest Gleason, MD   10 mL at 03/01/15 1305    PHYSICAL EXAMINATION: ECOG PERFORMANCE STATUS: 0 - Asymptomatic  BP 163/87 mmHg  Pulse 60  Temp(Src) 97.4 F (36.3 C)  Resp 18  Wt 109 lb 3 oz (49.527 kg)  Filed Weights   12/28/15 1039  Weight: 109 lb 3 oz (49.527 kg)    GENERAL: Well-nourished well-developed; Alert, no distress and comfortable. Accompanied by her daughter. Patient is a wheelchair. EYES: no pallor or icterus OROPHARYNX: no thrush or ulceration; good dentition  NECK: supple, no masses felt LYMPH:  no palpable lymphadenopathy in the cervical, axillary or inguinal regions LUNGS: clear to auscultation and  No wheeze or crackles HEART/CVS: regular rate & rhythm and no murmurs; No lower extremity edema ABDOMEN:abdomen soft, non-tender and normal bowel sounds Musculoskeletal:no cyanosis of digits and no clubbing  PSYCH: alert & oriented x 3 with fluent speech NEURO: no focal motor/sensory deficits SKIN:  no rashes or significant lesions  LABORATORY DATA:  I have reviewed the data as listed    Component Value Date/Time   NA 136 12/28/2015 1000   NA 138 09/19/2014 1118   K 4.0 12/28/2015 1000   K 4.0 09/19/2014 1118   CL 103 12/28/2015 1000   CL 103 09/19/2014 1118   CO2 26 12/28/2015 1000   CO2 29 09/19/2014 1118   GLUCOSE 96 12/28/2015 1000    GLUCOSE 91 09/19/2014 1118   BUN 18 12/28/2015 1000   BUN 15 09/19/2014 1118   CREATININE 0.79 12/28/2015 1000   CREATININE 0.69 09/19/2014 1118   CALCIUM 8.9 12/28/2015 1000   CALCIUM 9.0 09/19/2014 1118   PROT 6.8 12/28/2015 1000   PROT 6.8 09/19/2014 1118   ALBUMIN 3.9 12/28/2015 1000   ALBUMIN 3.9 09/19/2014 1118   AST 11* 12/28/2015 1000   AST 20 09/19/2014 1118   ALT 11* 12/28/2015 1000   ALT 16 09/19/2014 1118   ALKPHOS 67 12/28/2015 1000   ALKPHOS 85 09/19/2014 1118   BILITOT 0.9 12/28/2015 1000   BILITOT 0.6 09/19/2014 1118   GFRNONAA >60 12/28/2015 1000   GFRNONAA >60 09/19/2014 1118   GFRNONAA 58* 06/23/2014 1028   GFRAA >60 12/28/2015 1000   GFRAA >60 09/19/2014 1118   GFRAA >60 06/23/2014 1028    No results found for: SPEP, UPEP  Lab Results  Component Value Date   WBC 3.8 12/28/2015   NEUTROABS 1.9 12/28/2015   HGB 13.1 12/28/2015   HCT 38.5 12/28/2015   MCV 95.7 12/28/2015   PLT 224 12/28/2015      Chemistry      Component Value Date/Time   NA 136 12/28/2015 1000   NA 138 09/19/2014 1118   K 4.0 12/28/2015 1000   K 4.0 09/19/2014 1118   CL 103 12/28/2015 1000   CL 103 09/19/2014 1118   CO2 26 12/28/2015 1000   CO2 29 09/19/2014 1118   BUN 18 12/28/2015 1000   BUN 15 09/19/2014 1118   CREATININE 0.79 12/28/2015 1000   CREATININE 0.69 09/19/2014 1118      Component Value Date/Time   CALCIUM 8.9 12/28/2015 1000   CALCIUM 9.0 09/19/2014 1118   ALKPHOS 67 12/28/2015 1000   ALKPHOS 85 09/19/2014 1118   AST 11* 12/28/2015 1000   AST 20 09/19/2014 1118   ALT 11* 12/28/2015 1000   ALT 16 09/19/2014 1118   BILITOT 0.9 12/28/2015 1000   BILITOT 0.6 09/19/2014 1118       RADIOGRAPHIC STUDIES: I have personally reviewed the radiological images as listed and agreed with the findings in the report. Dg Bone Survey Met  12/28/2015  CLINICAL DATA:  History of multiple myeloma, now with right hip pain EXAM: METASTATIC BONE SURVEY COMPARISON:   Metastatic bone survey of 02/23/2013 FINDINGS: The lungs are not well aerated but no focal infiltrate or effusion is seen. Mediastinal and hilar contours are unchanged in the heart is within normal limits in size. A right-sided Port-A-Cath is present with the tip is seen overlying the lower SVC. The bones appear osteopenic. As noted on the prior study there are multiple small radiolucent foci scattered throughout the calvarium suspicious for diffuse myelomatous involvement. These lesions do not appear to have increased in number or size. There is somewhat exaggerated lordosis of the cervical spine. Degenerative disc disease is present diffusely. No compression deformity is seen. Views the right shoulder and humerus show degenerative change in the right shoulder. There is a bubbly appearance of the lesion involving the mid lateral right scapula which was present previously and could represent a benign process or myelomatous involvement which has not changed. Views of the left shoulder show mild degenerative change. No new radiolucent lesion is seen. Views of both forearms show no definite new radiolucent lesions. Views of the thoracic spine show kyphosis and osteopenia but no could depression deformity is seen. Views the lumbar spine show curvature convex to the right. Old compression deformity of L1 vertebral body it is stable. There are a few small radiolucent lesions within the left iliac wing laterally which are unchanged compared to the prior metastatic bone survey. Views of the right hip and femur show a small radiolucent lesion within the subtrochanteric femur as well as questionable lesions in the proximal right femoral shaft which may have been present previously. Views the left hip and femur show 2 adjacent small radiolucent lesions in the mid distal left femoral shaft which were present previously. No new abnormality is seen. Views of both tibia and fibula show no abnormality. IMPRESSION: 1. No significant  change in multiple small radiolucent lesions throughout the calvarium. 2. Several scattered radiolucent lesions again are noted i.e. within the lateral mid right scapula, left iliac wing, subtrochanteric right femur, proximal right femoral shaft, and mid distal left femoral shaft several of which were present previously and have not changed  in the interval. 3. Stable partial compression deformity of L1 vertebral body. 4. Diffuse osteopenia. Electronically Signed   By: Ivar Drape M.D.   On: 12/28/2015 14:09     ASSESSMENT & PLAN:  Multiple myeloma in relapse ([facility]) Multiple myeloma relapsed currently on pomolyst 4 mg 2 weeks on 2 week off- patient seems to be tolerating well without any major side effects. Most recent myeloma numbers in May 2017 M protein 0.4 g; kappa lambda light chain ratio slightly abnormal at 2. Normal CBC CMP.  # right hip pain- new; on tylenol.   # Continue pomolyst at the current dose. She seems to be tolerating it well. New prescription given for the same.  # History of osteonecrosis of the jaw- not on Zometa.  # Follow-up in 4 weeks with labs; [phone] [pt]     Orders Placed This Encounter  Procedures  . DG Bone Survey Met    Standing Status: Future     Number of Occurrences: 1     Standing Expiration Date: 02/26/2017    Order Specific Question:  Reason for Exam (SYMPTOM  OR DIAGNOSIS REQUIRED)    Answer:  multiple myeloma    Order Specific Question:  Preferred imaging location?    Answer:  [facility]  . CBC with Differential/Platelet    Standing Status: Future     Number of Occurrences:      Standing Expiration Date: 01/31/2017  . Comprehensive metabolic panel    Standing Status: Future     Number of Occurrences:      Standing Expiration Date: 01/31/2017  . Multiple Myeloma Panel (SPEP&IFE w/QIG)    Standing Status: Future     Number of Occurrences:      Standing Expiration Date: 01/31/2017  . Kappa/lambda light chains    Standing Status:  Future     Number of Occurrences:      Standing Expiration Date: 01/31/2017   All questions were answered. The patient knows to call the clinic with any problems, questions or concerns.      Cammie Sickle, MD 12/28/2015 6:03 PM

## 2015-12-28 NOTE — Assessment & Plan Note (Addendum)
Multiple myeloma relapsed currently on pomolyst 4 mg 2 weeks on 2 week off- patient seems to be tolerating well without any major side effects. Most recent myeloma numbers in May 2017 M protein 0.4 g; kappa lambda light chain ratio slightly abnormal at 2. Normal CBC CMP.  # right hip pain- new; on tylenol.   # Continue pomolyst at the current dose. She seems to be tolerating it well. New prescription given for the same.  # History of osteonecrosis of the jaw- not on Zometa.  # Follow-up in 4 weeks with labs; [phone] [pt]

## 2015-12-28 NOTE — Progress Notes (Signed)
Patient states for about 2 weeks she has been having right hip pain.  Also states she needs refill for Pomalyst. Patient also has bilateral lower extremity edema, worse on right side.

## 2015-12-29 ENCOUNTER — Other Ambulatory Visit: Payer: Self-pay | Admitting: *Deleted

## 2015-12-29 DIAGNOSIS — C9002 Multiple myeloma in relapse: Secondary | ICD-10-CM

## 2015-12-29 LAB — KAPPA/LAMBDA LIGHT CHAINS
KAPPA, LAMDA LIGHT CHAIN RATIO: 1.89 — AB (ref 0.26–1.65)
Kappa free light chain: 15.7 mg/L (ref 3.3–19.4)
Lambda free light chains: 8.3 mg/L (ref 5.7–26.3)

## 2015-12-29 NOTE — Telephone Encounter (Signed)
Called patient and informed her that x-ray showed old spots of MM.  Due to worsening pain, MD recommends referral to Orthopedic surgeon.    Referral made to Dr. Marry Guan @ [facility].

## 2016-01-02 ENCOUNTER — Telehealth: Payer: Self-pay

## 2016-01-02 LAB — MULTIPLE MYELOMA PANEL, SERUM
Albumin SerPl Elph-Mcnc: 3.4 g/dL (ref 2.9–4.4)
Albumin/Glob SerPl: 1.4 (ref 0.7–1.7)
Alpha 1: 0.2 g/dL (ref 0.0–0.4)
Alpha2 Glob SerPl Elph-Mcnc: 0.7 g/dL (ref 0.4–1.0)
B-GLOBULIN SERPL ELPH-MCNC: 1 g/dL (ref 0.7–1.3)
GAMMA GLOB SERPL ELPH-MCNC: 0.7 g/dL (ref 0.4–1.8)
Globulin, Total: 2.6 g/dL (ref 2.2–3.9)
IGA: 28 mg/dL — AB (ref 64–422)
IgG (Immunoglobin G), Serum: 657 mg/dL — ABNORMAL LOW (ref 700–1600)
IgM, Serum: 52 mg/dL (ref 26–217)
M PROTEIN SERPL ELPH-MCNC: 0.5 g/dL — AB
TOTAL PROTEIN ELP: 6 g/dL (ref 6.0–8.5)

## 2016-01-02 NOTE — Telephone Encounter (Signed)
Called pt to report per Dr. B he reviewed the X-rays- no obvious reason for her hip pain noted; If significantly worse- recommend referral to orthopedic doc; or can follow up with PCP.  Pt verbalized an understanding.  No other concerns noted.

## 2016-01-11 DIAGNOSIS — G8929 Other chronic pain: Secondary | ICD-10-CM | POA: Diagnosis not present

## 2016-01-11 DIAGNOSIS — M545 Low back pain: Secondary | ICD-10-CM | POA: Diagnosis not present

## 2016-01-25 ENCOUNTER — Other Ambulatory Visit: Payer: Self-pay

## 2016-01-25 ENCOUNTER — Inpatient Hospital Stay (HOSPITAL_BASED_OUTPATIENT_CLINIC_OR_DEPARTMENT_OTHER): Payer: Medicare Other | Admitting: Internal Medicine

## 2016-01-25 ENCOUNTER — Inpatient Hospital Stay: Payer: Medicare Other | Attending: Internal Medicine

## 2016-01-25 DIAGNOSIS — Z79899 Other long term (current) drug therapy: Secondary | ICD-10-CM | POA: Insufficient documentation

## 2016-01-25 DIAGNOSIS — M879 Osteonecrosis, unspecified: Secondary | ICD-10-CM | POA: Insufficient documentation

## 2016-01-25 DIAGNOSIS — I4891 Unspecified atrial fibrillation: Secondary | ICD-10-CM | POA: Insufficient documentation

## 2016-01-25 DIAGNOSIS — I2510 Atherosclerotic heart disease of native coronary artery without angina pectoris: Secondary | ICD-10-CM | POA: Diagnosis not present

## 2016-01-25 DIAGNOSIS — Z923 Personal history of irradiation: Secondary | ICD-10-CM | POA: Diagnosis not present

## 2016-01-25 DIAGNOSIS — Z809 Family history of malignant neoplasm, unspecified: Secondary | ICD-10-CM

## 2016-01-25 DIAGNOSIS — M129 Arthropathy, unspecified: Secondary | ICD-10-CM

## 2016-01-25 DIAGNOSIS — I10 Essential (primary) hypertension: Secondary | ICD-10-CM | POA: Insufficient documentation

## 2016-01-25 DIAGNOSIS — C9002 Multiple myeloma in relapse: Secondary | ICD-10-CM

## 2016-01-25 DIAGNOSIS — F419 Anxiety disorder, unspecified: Secondary | ICD-10-CM | POA: Insufficient documentation

## 2016-01-25 DIAGNOSIS — M25551 Pain in right hip: Secondary | ICD-10-CM | POA: Diagnosis not present

## 2016-01-25 DIAGNOSIS — E785 Hyperlipidemia, unspecified: Secondary | ICD-10-CM | POA: Diagnosis not present

## 2016-01-25 LAB — CBC WITH DIFFERENTIAL/PLATELET
Basophils Absolute: 0 10*3/uL (ref 0–0.1)
Basophils Relative: 0 %
EOS ABS: 0 10*3/uL (ref 0–0.7)
EOS PCT: 0 %
HCT: 38.3 % (ref 35.0–47.0)
Hemoglobin: 12.9 g/dL (ref 12.0–16.0)
LYMPHS ABS: 0.4 10*3/uL — AB (ref 1.0–3.6)
Lymphocytes Relative: 6 %
MCH: 32.4 pg (ref 26.0–34.0)
MCHC: 33.6 g/dL (ref 32.0–36.0)
MCV: 96.6 fL (ref 80.0–100.0)
MONO ABS: 1.5 10*3/uL — AB (ref 0.2–0.9)
Monocytes Relative: 22 %
Neutro Abs: 4.9 10*3/uL (ref 1.4–6.5)
Neutrophils Relative %: 72 %
PLATELETS: 190 10*3/uL (ref 150–440)
RBC: 3.96 MIL/uL (ref 3.80–5.20)
RDW: 15.7 % — AB (ref 11.5–14.5)
WBC: 6.9 10*3/uL (ref 3.6–11.0)

## 2016-01-25 LAB — COMPREHENSIVE METABOLIC PANEL
ALT: 21 U/L (ref 14–54)
ANION GAP: 6 (ref 5–15)
AST: 19 U/L (ref 15–41)
Albumin: 3.8 g/dL (ref 3.5–5.0)
Alkaline Phosphatase: 91 U/L (ref 38–126)
BUN: 19 mg/dL (ref 6–20)
CHLORIDE: 103 mmol/L (ref 101–111)
CO2: 24 mmol/L (ref 22–32)
Calcium: 8.2 mg/dL — ABNORMAL LOW (ref 8.9–10.3)
Creatinine, Ser: 0.68 mg/dL (ref 0.44–1.00)
GFR calc non Af Amer: >60 mL/min (ref >60)
Glucose, Bld: 118 mg/dL — ABNORMAL HIGH (ref 65–99)
POTASSIUM: 3.6 mmol/L (ref 3.5–5.1)
SODIUM: 133 mmol/L — AB (ref 135–145)
Total Bilirubin: 1.5 mg/dL — ABNORMAL HIGH (ref 0.3–1.2)
Total Protein: 6.7 g/dL (ref 6.5–8.1)

## 2016-01-25 NOTE — Assessment & Plan Note (Addendum)
Multiple myeloma relapsed currently on pomolyst 4 mg 2 weeks on 2 week off- patient seems to be tolerating well without any major side effects. Most recent myeloma numbers in July 2017 M protein 0.4 g; kappa lambda light chain ratio slightly abnormal at 2. Normal CBC CMP.  July 2017 skeletal survey stable bone lesions.  # right hip pain- new; on tylenol- helping. Monitor for now. It gets worse recommend dedicated imaging.  # Continue pomolyst at the current dose. She seems to be tolerating it well.   # History of osteonecrosis of the jaw- not on Zometa.  # Follow-up in 4 weeks with labs.

## 2016-01-25 NOTE — Progress Notes (Signed)
[place] OFFICE PROGRESS NOTE  Patient Care Team: Derinda Late, MD as PCP - General (Family Medicine) Forest Gleason, MD (Oncology) Seeplaputhur Robinette Haines, MD (General Surgery)  No matching staging information was found for the patient.   Oncology History   1. Stage III Multiple Myeloma a. 11/20/09: SPEP showed quantitative IgG 4567 mg/dL with M spike of 3.8 g/dL and IFE showing IgG monoclonal protein with light chain specificity.  Ramdom urine protein electrophoresis showed M spike 27% Bence Jones Protein.  a. 11/28/09: bone marrow biopsy: - PLASMA CELL DYSCRASIA CONSISTENT WITH MULTIPLE MYELOMA (55%PLASMA CELLS ON ASPIRATE, 75% BY CD138 IMMUNOHISTOCHEMISTRY,KAPPA RESTRICTED).TRILINEAGE HEMATOPOIESIS.46 XX,  Normal FISH panel. b. 11/29/09: bone survey: There are numerous, scattered 2 mm to 5 mm concentric  radiolucencies in the bony calvaria.   Two or three similar focal lucent areas in the right  femur.  There are observed changes consistent with disc disease at multiple levels of the cervical spine and multiple levels of the lumbar spine. c. 12/07/09: right humerus: Multiple tiny lucencies noted throughout the right humerus including humeral diaphysis. No fracture. d. 8/11- 4/12 Mephalan/prednisone/velcade X 6 cycles with decrease in Mspike to 1.0 gm/dl. e.osteonecrosis of jaw and diagnosed in December of 2013(Zometa was discontinued) 7.Patient was started on Velcade and Decadron   because of progressive multiple myeloma from January of 2013. 8. Started on Kyprolis 05/13 9.progressing disease on kyphorilis September of 2013 10 patient has been started on [name] daily for 21 days and one week off Decadron day1, 18 and 15   with 4 week cycle is finished palliative radiation therapy without much relief in pain(November, 2013) 11.Palliative radiation therapy to the right shoulder (December, 2014) 12 patient was on break from pomolidomide because of infection and it has  been presumed from December 05, 2014  # July 2017- skeletal survey- stable bone lesions.      Multiple myeloma in relapse ([place])   11/20/2009 Initial Diagnosis    Multiple myeloma in relapse       INTERVAL HISTORY:  Adelai Achey Looney 82 y.o.  female pleasant patient above history of Multiple myeloma currently on pomolyst is here for follow-up.  Patient continues to complain of right hip pain needing to take Tylenol as needed. However this is not significantly worse.  Appetite is good. No nausea no vomiting. No skin rash. No diarrhea. No chest pain or shortness of breath or cough. Denies any tingling or numbness.  REVIEW OF SYSTEMS:  A complete 10 point review of system is done which is negative except mentioned above/history of present illness.   PAST MEDICAL HISTORY :  Past Medical History:  Diagnosis Date  . Anxiety   . Arthritis   . Atrial fibrillation ([place])   . Coronary arteriosclerosis   . History of blood transfusion 2014  . Hyperlipidemia   . Hypertension   . Multiple myeloma ([place])   . Multiple myeloma in relapse ([place]) 10/29/2014  . Osteonecrosis of jaw ([place])    left    PAST SURGICAL HISTORY :   Past Surgical History:  Procedure Laterality Date  . ABDOMINAL HYSTERECTOMY    . APPENDECTOMY    . PARTIAL COLECTOMY    . TOTAL KNEE REVISION Left     FAMILY HISTORY :   Family History  Problem Relation Age of Onset  . Cancer Father   . Heart attack Mother     SOCIAL HISTORY:   Social History  Substance Use Topics  . Smoking status: Never Smoker  .  Smokeless tobacco: Never Used  . Alcohol use 1.2 oz/week    2 Glasses of wine per week    ALLERGIES:  is allergic to biaxin [clarithromycin]; demerol [meperidine]; hydrocodone; sulfa antibiotics; tramadol; and ultracet [tramadol-acetaminophen].  MEDICATIONS:  Current Outpatient Prescriptions  Medication Sig Dispense Refill  . Biotin 1 MG CAPS Take by mouth.    . dexamethasone (DECADRON) 4 MG tablet Take 5 tablets  (20 mg total) by mouth daily. Take on day 1, day 8, and day 15. 15 tablet 0  . dextromethorphan 15 MG/5ML syrup Take 10 mLs (30 mg total) by mouth 4 (four) times daily as needed for cough. 120 mL 0  . ibuprofen (ADVIL,MOTRIN) 800 MG tablet Take 1 tablet (800 mg total) by mouth every 8 (eight) hours as needed for moderate pain. 30 tablet 3  . POMALYST 4 MG capsule Take 1 capsule (4 mg total) by mouth daily. For 2 weeks then 2 weeks off. 14 capsule 0   No current facility-administered medications for this visit.    Facility-Administered Medications Ordered in Other Visits  Medication Dose Route Frequency Provider Last Rate Last Dose  . sodium chloride 0.9 % injection 10 mL  10 mL Intravenous PRN Forest Gleason, MD   10 mL at 03/01/15 1305    PHYSICAL EXAMINATION: ECOG PERFORMANCE STATUS: 0 - Asymptomatic  BP 136/73 (BP Location: Left Arm, Patient Position: Sitting)   Pulse 71   Temp 98.6 F (37 C) (Tympanic)   Resp 18   Wt 112 lb 8 oz (51 kg)   BMI 20.58 kg/m   Filed Weights   01/25/16 1159  Weight: 112 lb 8 oz (51 kg)    GENERAL: Well-nourished well-developed; Alert, no distress and comfortable. Accompanied by her [name] Patient continues to complain of slight pain in her right hip she is taking Tylenol as needed. Currently helping.d. Patient is a wheelchair. EYES: no pallor or icterus OROPHARYNX: no thrush or ulceration; good dentition  NECK: supple, no masses felt LYMPH:  no palpable lymphadenopathy in the cervical, axillary or inguinal regions LUNGS: clear to auscultation and  No wheeze or crackles HEART/CVS: regular rate & rhythm and no murmurs; No lower extremity edema ABDOMEN:abdomen soft, non-tender and normal bowel sounds Musculoskeletal:no cyanosis of digits and no clubbing  PSYCH: alert & oriented x 3 with fluent speech NEURO: no focal motor/sensory deficits SKIN:  no rashes or significant lesions  LABORATORY DATA:  I have reviewed the data as listed    Component  Value Date/Time   NA 133 (L) 01/25/2016 1117   NA 138 09/19/2014 1118   K 3.6 01/25/2016 1117   K 4.0 09/19/2014 1118   CL 103 01/25/2016 1117   CL 103 09/19/2014 1118   CO2 24 01/25/2016 1117   CO2 29 09/19/2014 1118   GLUCOSE 118 (H) 01/25/2016 1117   GLUCOSE 91 09/19/2014 1118   BUN 19 01/25/2016 1117   BUN 15 09/19/2014 1118   CREATININE 0.68 01/25/2016 1117   CREATININE 0.69 09/19/2014 1118   CALCIUM 8.2 (L) 01/25/2016 1117   CALCIUM 9.0 09/19/2014 1118   PROT 6.7 01/25/2016 1117   PROT 6.8 09/19/2014 1118   ALBUMIN 3.8 01/25/2016 1117   ALBUMIN 3.9 09/19/2014 1118   AST 19 01/25/2016 1117   AST 20 09/19/2014 1118   ALT 21 01/25/2016 1117   ALT 16 09/19/2014 1118   ALKPHOS 91 01/25/2016 1117   ALKPHOS 85 09/19/2014 1118   BILITOT 1.5 (H) 01/25/2016 1117   BILITOT 0.6 09/19/2014  [place] 01/25/2016 1117   GFRNONAA >60 09/19/2014 1118   GFRAA >60 01/25/2016 1117   GFRAA >60 09/19/2014 1118    No results found for: SPEP, UPEP  Lab Results  Component Value Date   WBC 6.9 01/25/2016   NEUTROABS 4.9 01/25/2016   HGB 12.9 01/25/2016   HCT 38.3 01/25/2016   MCV 96.6 01/25/2016   PLT 190 01/25/2016      Chemistry      Component Value Date/Time   NA 133 (L) 01/25/2016 1117   NA 138 09/19/2014 1118   K 3.6 01/25/2016 1117   K 4.0 09/19/2014 1118   CL 103 01/25/2016 1117   CL 103 09/19/2014 1118   CO2 24 01/25/2016 1117   CO2 29 09/19/2014 1118   BUN 19 01/25/2016 1117   BUN 15 09/19/2014 1118   CREATININE 0.68 01/25/2016 1117   CREATININE 0.69 09/19/2014 1118      Component Value Date/Time   CALCIUM 8.2 (L) 01/25/2016 1117   CALCIUM 9.0 09/19/2014 1118   ALKPHOS 91 01/25/2016 1117   ALKPHOS 85 09/19/2014 1118   AST 19 01/25/2016 1117   AST 20 09/19/2014 1118   ALT 21 01/25/2016 1117   ALT 16 09/19/2014 1118   BILITOT 1.5 (H) 01/25/2016 1117   BILITOT 0.6 09/19/2014 1118       RADIOGRAPHIC STUDIES: I have personally reviewed the  radiological images as listed and agreed with the findings in the report. No results found.   ASSESSMENT & PLAN:  Multiple myeloma in relapse ([place]) Multiple myeloma relapsed currently on pomolyst 4 mg 2 weeks on 2 week off- patient seems to be tolerating well without any major side effects. Most recent myeloma numbers in July 2017 M protein 0.4 g; kappa lambda light chain ratio slightly abnormal at 2. Normal CBC CMP.  July 2017 skeletal survey stable bone lesions.  # right hip pain- new; on tylenol- helping. Monitor for now. It gets worse recommend dedicated imaging.  # Continue pomolyst at the current dose. She seems to be tolerating it well.   # History of osteonecrosis of the jaw- not on Zometa.  # Follow-up in 4 weeks with labs.   No orders of the defined types were placed in this encounter.  All questions were answered. The patient knows to call the clinic with any problems, questions or concerns.      Cammie Sickle, MD 01/25/2016 1:46 PM

## 2016-01-26 LAB — KAPPA/LAMBDA LIGHT CHAINS
KAPPA, LAMDA LIGHT CHAIN RATIO: 1.33 (ref 0.26–1.65)
Kappa free light chain: 12.8 mg/L (ref 3.3–19.4)
Lambda free light chains: 9.6 mg/L (ref 5.7–26.3)

## 2016-01-30 LAB — MULTIPLE MYELOMA PANEL, SERUM
Albumin SerPl Elph-Mcnc: 3.1 g/dL (ref 2.9–4.4)
Albumin/Glob SerPl: 1.2 (ref 0.7–1.7)
Alpha 1: 0.4 g/dL (ref 0.0–0.4)
Alpha2 Glob SerPl Elph-Mcnc: 0.8 g/dL (ref 0.4–1.0)
B-GLOBULIN SERPL ELPH-MCNC: 0.9 g/dL (ref 0.7–1.3)
Gamma Glob SerPl Elph-Mcnc: 0.6 g/dL (ref 0.4–1.8)
Globulin, Total: 2.7 g/dL (ref 2.2–3.9)
IGM, SERUM: 69 mg/dL (ref 26–217)
IgA: 28 mg/dL — ABNORMAL LOW (ref 64–422)
IgG (Immunoglobin G), Serum: 609 mg/dL — ABNORMAL LOW (ref 700–1600)
M PROTEIN SERPL ELPH-MCNC: 0.4 g/dL — AB
TOTAL PROTEIN ELP: 5.8 g/dL — AB (ref 6.0–8.5)

## 2016-02-01 ENCOUNTER — Other Ambulatory Visit: Payer: Self-pay | Admitting: *Deleted

## 2016-02-01 DIAGNOSIS — C9002 Multiple myeloma in relapse: Secondary | ICD-10-CM

## 2016-02-01 MED ORDER — POMALYST 4 MG PO CAPS: 4.0000 mg | ORAL_CAPSULE | Freq: Every day | ORAL | 0 refills | Status: DC

## 2016-02-23 ENCOUNTER — Inpatient Hospital Stay (HOSPITAL_BASED_OUTPATIENT_CLINIC_OR_DEPARTMENT_OTHER): Payer: Medicare Other | Admitting: Internal Medicine

## 2016-02-23 ENCOUNTER — Encounter: Payer: Self-pay | Admitting: Internal Medicine

## 2016-02-23 ENCOUNTER — Inpatient Hospital Stay: Payer: Medicare Other | Attending: Internal Medicine

## 2016-02-23 VITALS — BP 179/82 | HR 59 | Temp 96.4°F | Resp 17 | Ht 62.0 in | Wt 111.2 lb

## 2016-02-23 DIAGNOSIS — M129 Arthropathy, unspecified: Secondary | ICD-10-CM | POA: Diagnosis not present

## 2016-02-23 DIAGNOSIS — Z809 Family history of malignant neoplasm, unspecified: Secondary | ICD-10-CM | POA: Insufficient documentation

## 2016-02-23 DIAGNOSIS — M25551 Pain in right hip: Secondary | ICD-10-CM

## 2016-02-23 DIAGNOSIS — F419 Anxiety disorder, unspecified: Secondary | ICD-10-CM | POA: Insufficient documentation

## 2016-02-23 DIAGNOSIS — C9002 Multiple myeloma in relapse: Secondary | ICD-10-CM | POA: Diagnosis not present

## 2016-02-23 DIAGNOSIS — I4891 Unspecified atrial fibrillation: Secondary | ICD-10-CM | POA: Insufficient documentation

## 2016-02-23 DIAGNOSIS — E785 Hyperlipidemia, unspecified: Secondary | ICD-10-CM | POA: Diagnosis not present

## 2016-02-23 DIAGNOSIS — Z923 Personal history of irradiation: Secondary | ICD-10-CM | POA: Insufficient documentation

## 2016-02-23 DIAGNOSIS — M879 Osteonecrosis, unspecified: Secondary | ICD-10-CM | POA: Insufficient documentation

## 2016-02-23 DIAGNOSIS — I10 Essential (primary) hypertension: Secondary | ICD-10-CM | POA: Diagnosis not present

## 2016-02-23 DIAGNOSIS — Z79899 Other long term (current) drug therapy: Secondary | ICD-10-CM | POA: Insufficient documentation

## 2016-02-23 DIAGNOSIS — I2510 Atherosclerotic heart disease of native coronary artery without angina pectoris: Secondary | ICD-10-CM | POA: Diagnosis not present

## 2016-02-23 LAB — COMPREHENSIVE METABOLIC PANEL
ALT: 14 U/L (ref 14–54)
ANION GAP: 5 (ref 5–15)
AST: 17 U/L (ref 15–41)
Albumin: 3.7 g/dL (ref 3.5–5.0)
Alkaline Phosphatase: 62 U/L (ref 38–126)
BUN: 16 mg/dL (ref 6–20)
CALCIUM: 8.6 mg/dL — AB (ref 8.9–10.3)
CHLORIDE: 105 mmol/L (ref 101–111)
CO2: 26 mmol/L (ref 22–32)
CREATININE: 0.62 mg/dL (ref 0.44–1.00)
Glucose, Bld: 86 mg/dL (ref 65–99)
Potassium: 3.7 mmol/L (ref 3.5–5.1)
SODIUM: 136 mmol/L (ref 135–145)
Total Bilirubin: 0.8 mg/dL (ref 0.3–1.2)
Total Protein: 6.5 g/dL (ref 6.5–8.1)

## 2016-02-23 LAB — CBC WITH DIFFERENTIAL/PLATELET
Basophils Absolute: 0 10*3/uL (ref 0–0.1)
Basophils Relative: 1 %
EOS ABS: 0.1 10*3/uL (ref 0–0.7)
EOS PCT: 3 %
HCT: 36.8 % (ref 35.0–47.0)
Hemoglobin: 12.4 g/dL (ref 12.0–16.0)
LYMPHS ABS: 0.7 10*3/uL — AB (ref 1.0–3.6)
LYMPHS PCT: 18 %
MCH: 31.9 pg (ref 26.0–34.0)
MCHC: 33.6 g/dL (ref 32.0–36.0)
MCV: 95.1 fL (ref 80.0–100.0)
MONO ABS: 0.6 10*3/uL (ref 0.2–0.9)
MONOS PCT: 17 %
Neutro Abs: 2.2 10*3/uL (ref 1.4–6.5)
Neutrophils Relative %: 61 %
PLATELETS: 162 10*3/uL (ref 150–440)
RBC: 3.88 MIL/uL (ref 3.80–5.20)
RDW: 15.4 % — ABNORMAL HIGH (ref 11.5–14.5)
WBC: 3.7 10*3/uL (ref 3.6–11.0)

## 2016-02-23 MED ORDER — POMALYST 4 MG PO CAPS: 4.0000 mg | ORAL_CAPSULE | Freq: Every day | ORAL | 6 refills | Status: DC

## 2016-02-23 NOTE — Progress Notes (Signed)
[facility] OFFICE PROGRESS NOTE  Patient Care Team: Penny Late, MD as PCP - General (Family Medicine) Penny Gleason, MD (Oncology) Penny Robinette Haines, MD (General Surgery)  No matching staging information was found for the patient.   Oncology History   1. Stage III Multiple Myeloma a. 11/20/09: SPEP showed quantitative IgG 4567 mg/dL with M spike of 3.8 g/dL and IFE showing IgG monoclonal protein with light chain specificity.  Ramdom urine protein electrophoresis showed M spike 27% Bence Jones Protein.  a. 11/28/09: bone marrow biopsy: - PLASMA CELL DYSCRASIA CONSISTENT WITH MULTIPLE MYELOMA (55%PLASMA CELLS ON ASPIRATE, 75% BY CD138 IMMUNOHISTOCHEMISTRY,KAPPA RESTRICTED).TRILINEAGE HEMATOPOIESIS.46 XX,  Normal FISH panel. b. 11/29/09: bone survey: There are numerous, scattered 2 mm to 5 mm concentric  radiolucencies in the bony calvaria.   Two or three similar focal lucent areas in the right  femur.  There are observed changes consistent with disc disease at multiple levels of the cervical spine and multiple levels of the lumbar spine. c. 12/07/09: right humerus: Multiple tiny lucencies noted throughout the right humerus including humeral diaphysis. No fracture. d. 8/11- 4/12 Mephalan/prednisone/velcade X 6 cycles with decrease in Mspike to 1.0 gm/dl. e.osteonecrosis of jaw and diagnosed in December of 2013(Zometa was discontinued) 7.Patient was started on Velcade and Decadron   because of progressive multiple myeloma from January of 2013. 8. Started on Kyprolis 05/13 9.progressing disease on kyphorilis September of 2013 10 patient has been started on [name] daily for 21 days and one week off Decadron day1, 18 and 15   with 4 week cycle is finished palliative radiation therapy without much relief in pain(November, 2013) 11.Palliative radiation therapy to the right shoulder (December, 2014) 12 patient was on break from pomolidomide because of infection and it has  been presumed from December 05, 2014  # July 2017- skeletal survey- stable bone lesions.      Multiple myeloma in relapse ([place])   11/20/2009 Initial Diagnosis    Multiple myeloma in relapse        INTERVAL HISTORY:  [name] 82 y.o.  female pleasant patient above history of Multiple myeloma currently on pomolyst is here for follow-up.  Patient's right hip pain is improved. She is taking Tylenol as needed. She has recently met with orthopedics. No intervention is done.  Appetite is good. No nausea no vomiting. No skin rash. No diarrhea. No chest pain or shortness of breath or cough. Denies any tingling or numbness.  REVIEW OF SYSTEMS:  A complete 10 point review of system is done which is negative except mentioned above/history of present illness.   PAST MEDICAL HISTORY :  Past Medical History:  Diagnosis Date  . Anxiety   . Arthritis   . Atrial fibrillation ([place])   . Coronary arteriosclerosis   . History of blood transfusion 2014  . Hyperlipidemia   . Hypertension   . Multiple myeloma ([place])   . Multiple myeloma in relapse ([place]) 10/29/2014  . Osteonecrosis of jaw ([place])    left    PAST SURGICAL HISTORY :   Past Surgical History:  Procedure Laterality Date  . ABDOMINAL HYSTERECTOMY    . APPENDECTOMY    . PARTIAL COLECTOMY    . TOTAL KNEE REVISION Left     FAMILY HISTORY :   Family History  Problem Relation Age of Onset  . Cancer Father   . Heart attack Mother     SOCIAL HISTORY:   Social History  Substance Use Topics  . Smoking  status: Never Smoker  . Smokeless tobacco: Never Used  . Alcohol use 1.2 oz/week    2 Glasses of wine per week    ALLERGIES:  is allergic to biaxin [clarithromycin]; demerol [meperidine]; hydrocodone; sulfa antibiotics; tramadol; and ultracet [tramadol-acetaminophen].  MEDICATIONS:  Current Outpatient Prescriptions  Medication Sig Dispense Refill  . Biotin 1 MG CAPS Take by mouth.    . dexamethasone (DECADRON) 4 MG tablet  Take 5 tablets (20 mg total) by mouth daily. Take on day 1, day 8, and day 15. 15 tablet 0  . dextromethorphan 15 MG/5ML syrup Take 10 mLs (30 mg total) by mouth 4 (four) times daily as needed for cough. 120 mL 0  . ibuprofen (ADVIL,MOTRIN) 800 MG tablet Take 1 tablet (800 mg total) by mouth every 8 (eight) hours as needed for moderate pain. 30 tablet 3  . POMALYST 4 MG capsule Take 1 capsule (4 mg total) by mouth daily. For 2 weeks then 2 weeks off. 14 capsule 6   No current facility-administered medications for this visit.    Facility-Administered Medications Ordered in Other Visits  Medication Dose Route Frequency Provider Last Rate Last Dose  . sodium chloride 0.9 % injection 10 mL  10 mL Intravenous PRN Penny Gleason, MD   10 mL at 03/01/15 1305    PHYSICAL EXAMINATION: ECOG PERFORMANCE STATUS: 0 - Asymptomatic  BP (!) 179/82 (BP Location: Left Arm, Patient Position: Sitting)   Pulse (!) 59   Temp (!) 96.4 F (35.8 C) (Tympanic)   Resp 17   Ht '5\' 2"'  (1.575 m)   Wt 111 lb 3.2 oz (50.4 kg)   BMI 20.34 kg/m   Filed Weights   02/23/16 1112  Weight: 111 lb 3.2 oz (50.4 kg)    GENERAL: Well-nourished well-developed; Alert, no distress and comfortable. Accompanied by [name] Patient continues to complain of slight pain in her right hip she is taking Tylenol as needed. Currently helping.d. Patient is a wheelchair. EYES: no pallor or icterus OROPHARYNX: no thrush or ulceration; good dentition  NECK: supple, no masses felt LYMPH:  no palpable lymphadenopathy in the cervical, axillary or inguinal regions LUNGS: clear to auscultation and  No wheeze or crackles HEART/CVS: regular rate & rhythm and no murmurs; No lower extremity edema ABDOMEN:abdomen soft, non-tender and normal bowel sounds Musculoskeletal:no cyanosis of digits and no clubbing  PSYCH: alert & oriented x 3 with fluent speech NEURO: no focal motor/sensory deficits SKIN:  no rashes or significant lesions  LABORATORY  DATA:  I have reviewed the data as listed    Component Value Date/Time   NA 136 02/23/2016 1028   NA 138 09/19/2014 1118   K 3.7 02/23/2016 1028   K 4.0 09/19/2014 1118   CL 105 02/23/2016 1028   CL 103 09/19/2014 1118   CO2 26 02/23/2016 1028   CO2 29 09/19/2014 1118   GLUCOSE 86 02/23/2016 1028   GLUCOSE 91 09/19/2014 1118   BUN 16 02/23/2016 1028   BUN 15 09/19/2014 1118   CREATININE 0.62 02/23/2016 1028   CREATININE 0.69 09/19/2014 1118   CALCIUM 8.6 (L) 02/23/2016 1028   CALCIUM 9.0 09/19/2014 1118   PROT 6.5 02/23/2016 1028   PROT 6.8 09/19/2014 1118   ALBUMIN 3.7 02/23/2016 1028   ALBUMIN 3.9 09/19/2014 1118   AST 17 02/23/2016 1028   AST 20 09/19/2014 1118   ALT 14 02/23/2016 1028   ALT 16 09/19/2014 1118   ALKPHOS 62 02/23/2016 1028   ALKPHOS 85 09/19/2014  1118   BILITOT 0.8 02/23/2016 1028   BILITOT 0.6 09/19/2014 1118   GFRNONAA >60 02/23/2016 1028   GFRNONAA >60 09/19/2014 1118   GFRAA >60 02/23/2016 1028   GFRAA >60 09/19/2014 1118    No results found for: SPEP, UPEP  Lab Results  Component Value Date   WBC 3.7 02/23/2016   NEUTROABS 2.2 02/23/2016   HGB 12.4 02/23/2016   HCT 36.8 02/23/2016   MCV 95.1 02/23/2016   PLT 162 02/23/2016      Chemistry      Component Value Date/Time   NA 136 02/23/2016 1028   NA 138 09/19/2014 1118   K 3.7 02/23/2016 1028   K 4.0 09/19/2014 1118   CL 105 02/23/2016 1028   CL 103 09/19/2014 1118   CO2 26 02/23/2016 1028   CO2 29 09/19/2014 1118   BUN 16 02/23/2016 1028   BUN 15 09/19/2014 1118   CREATININE 0.62 02/23/2016 1028   CREATININE 0.69 09/19/2014 1118      Component Value Date/Time   CALCIUM 8.6 (L) 02/23/2016 1028   CALCIUM 9.0 09/19/2014 1118   ALKPHOS 62 02/23/2016 1028   ALKPHOS 85 09/19/2014 1118   AST 17 02/23/2016 1028   AST 20 09/19/2014 1118   ALT 14 02/23/2016 1028   ALT 16 09/19/2014 1118   BILITOT 0.8 02/23/2016 1028   BILITOT 0.6 09/19/2014 1118       RADIOGRAPHIC  STUDIES: I have personally reviewed the radiological images as listed and agreed with the findings in the report. No results found.   ASSESSMENT & PLAN:  Multiple myeloma in relapse ([place]) Multiple myeloma relapsed currently on pomolyst 4 mg 2 weeks on 2 week off- patient seems to be tolerating well without any major side effects.  #  Most recent myeloma numbers in AUG  2017 M protein 0.4 g; kappa lambda light chain ratio= Normal.  Normal CBC CMP.  July 2017 skeletal survey stable bone lesions.  # right hip pain- new; on tylenol- helping. Monitor for now. It gets worse recommend dedicated imaging like PET scan.   # Continue pomolyst at the  2 weeks-on and 2 weeks off. current dose. She seems to be tolerating it well  # History of osteonecrosis of the jaw- not on Zometa.  # Follow-up in 4 weeks with labs.   Orders Placed This Encounter  Procedures  . CBC with Differential    Standing Status:   Future    Standing Expiration Date:   02/22/2017  . Comprehensive metabolic panel    Standing Status:   Future    Standing Expiration Date:   02/22/2017  . Multiple Myeloma Panel (SPEP&IFE w/QIG)    Standing Status:   Future    Standing Expiration Date:   02/22/2017  . Kappa/lambda light chains    Standing Status:   Future    Standing Expiration Date:   02/22/2017   All questions were answered. The patient knows to call the clinic with any problems, questions or concerns.      Cammie Sickle, MD 02/23/2016 12:04 PM

## 2016-02-23 NOTE — Progress Notes (Signed)
Per pt no changes and no concerns

## 2016-02-23 NOTE — Assessment & Plan Note (Signed)
Multiple myeloma relapsed currently on pomolyst 4 mg 2 weeks on 2 week off- patient seems to be tolerating well without any major side effects.  #  Most recent myeloma numbers in AUG  2017 M protein 0.4 g; kappa lambda light chain ratio= Normal.  Normal CBC CMP.  July 2017 skeletal survey stable bone lesions.  # right hip pain- new; on tylenol- helping. Monitor for now. It gets worse recommend dedicated imaging like PET scan.   # Continue pomolyst at the  2 weeks-on and 2 weeks off. current dose. She seems to be tolerating it well  # History of osteonecrosis of the jaw- not on Zometa.  # Follow-up in 4 weeks with labs.

## 2016-02-26 LAB — KAPPA/LAMBDA LIGHT CHAINS
KAPPA FREE LGHT CHN: 20.5 mg/L — AB (ref 3.3–19.4)
Kappa, lambda light chain ratio: 1.86 — ABNORMAL HIGH (ref 0.26–1.65)
LAMDA FREE LIGHT CHAINS: 11 mg/L (ref 5.7–26.3)

## 2016-02-27 LAB — MULTIPLE MYELOMA PANEL, SERUM
ALPHA 1: 0.2 g/dL (ref 0.0–0.4)
Albumin SerPl Elph-Mcnc: 3.4 g/dL (ref 2.9–4.4)
Albumin/Glob SerPl: 1.5 (ref 0.7–1.7)
Alpha2 Glob SerPl Elph-Mcnc: 0.6 g/dL (ref 0.4–1.0)
B-Globulin SerPl Elph-Mcnc: 0.9 g/dL (ref 0.7–1.3)
GAMMA GLOB SERPL ELPH-MCNC: 0.7 g/dL (ref 0.4–1.8)
Globulin, Total: 2.4 g/dL (ref 2.2–3.9)
IGA: 33 mg/dL — AB (ref 64–422)
IGG (IMMUNOGLOBIN G), SERUM: 694 mg/dL — AB (ref 700–1600)
IgM, Serum: 71 mg/dL (ref 26–217)
M PROTEIN SERPL ELPH-MCNC: 0.5 g/dL — AB
Total Protein ELP: 5.8 g/dL — ABNORMAL LOW (ref 6.0–8.5)

## 2016-02-29 ENCOUNTER — Other Ambulatory Visit: Payer: Self-pay | Admitting: *Deleted

## 2016-02-29 DIAGNOSIS — C9002 Multiple myeloma in relapse: Secondary | ICD-10-CM

## 2016-02-29 MED ORDER — POMALYST 4 MG PO CAPS: 4.0000 mg | ORAL_CAPSULE | Freq: Every day | ORAL | 6 refills | Status: DC

## 2016-03-04 ENCOUNTER — Telehealth: Payer: Self-pay | Admitting: *Deleted

## 2016-03-04 NOTE — Telephone Encounter (Signed)
I spoke with patient who is hesitant to contact police Stating that "but it's my neighbor" She stated she will discuss her son do it tonight.  I called and discussed this with her son Merry Proud, who reports that she has blamed her neighbor in the past for taking  Things which he has found because she has misplaced them. He states he does not even know that she had any or how many she had and that he had checked with the neighbor who denied even going into her house that they were on the porch she was drinking wine.  I called Pharmacy who stated her medicine is due to be delivered today.   Merry Proud called me back and I let him know that the med has not been delivered yet. He thanked me for checking into this and stated the plans have been for a while for her to move in with her son in [place] when the time comes and it is coming quicker than he had hoped.

## 2016-03-04 NOTE — Telephone Encounter (Signed)
Called to report that her entire prescription of Pomalyst is missing. She feels her neighbor took it as he has taken her meds in the past and  He was in her house the other day. Asking if we can have company send more as she is to start taking it again soon. Please advise I explained to her that she needs to confront him because of it being chemo and the side effects of taking it can be detrimental if someone gets pregnant. She stated she will have her son discuss it with him.

## 2016-03-04 NOTE — Telephone Encounter (Signed)
Please have patient file a police report.

## 2016-03-10 DIAGNOSIS — W19XXXA Unspecified fall, initial encounter: Secondary | ICD-10-CM | POA: Diagnosis not present

## 2016-03-10 DIAGNOSIS — L03113 Cellulitis of right upper limb: Secondary | ICD-10-CM | POA: Diagnosis not present

## 2016-03-10 DIAGNOSIS — M7989 Other specified soft tissue disorders: Secondary | ICD-10-CM | POA: Diagnosis not present

## 2016-03-10 DIAGNOSIS — M79631 Pain in right forearm: Secondary | ICD-10-CM | POA: Diagnosis not present

## 2016-03-10 DIAGNOSIS — M25521 Pain in right elbow: Secondary | ICD-10-CM | POA: Diagnosis not present

## 2016-03-22 ENCOUNTER — Inpatient Hospital Stay: Payer: Medicare Other | Attending: Internal Medicine

## 2016-03-22 ENCOUNTER — Inpatient Hospital Stay (HOSPITAL_BASED_OUTPATIENT_CLINIC_OR_DEPARTMENT_OTHER): Payer: Medicare Other | Admitting: Internal Medicine

## 2016-03-22 VITALS — BP 150/72 | HR 65 | Temp 97.8°F | Resp 16 | Ht 62.0 in | Wt 108.4 lb

## 2016-03-22 DIAGNOSIS — Z9181 History of falling: Secondary | ICD-10-CM | POA: Insufficient documentation

## 2016-03-22 DIAGNOSIS — M129 Arthropathy, unspecified: Secondary | ICD-10-CM | POA: Diagnosis not present

## 2016-03-22 DIAGNOSIS — E785 Hyperlipidemia, unspecified: Secondary | ICD-10-CM | POA: Insufficient documentation

## 2016-03-22 DIAGNOSIS — I10 Essential (primary) hypertension: Secondary | ICD-10-CM | POA: Diagnosis not present

## 2016-03-22 DIAGNOSIS — I2510 Atherosclerotic heart disease of native coronary artery without angina pectoris: Secondary | ICD-10-CM | POA: Insufficient documentation

## 2016-03-22 DIAGNOSIS — R410 Disorientation, unspecified: Secondary | ICD-10-CM | POA: Diagnosis not present

## 2016-03-22 DIAGNOSIS — C9002 Multiple myeloma in relapse: Secondary | ICD-10-CM

## 2016-03-22 DIAGNOSIS — Z809 Family history of malignant neoplasm, unspecified: Secondary | ICD-10-CM

## 2016-03-22 DIAGNOSIS — I4891 Unspecified atrial fibrillation: Secondary | ICD-10-CM | POA: Insufficient documentation

## 2016-03-22 DIAGNOSIS — F419 Anxiety disorder, unspecified: Secondary | ICD-10-CM | POA: Insufficient documentation

## 2016-03-22 DIAGNOSIS — Z923 Personal history of irradiation: Secondary | ICD-10-CM | POA: Diagnosis not present

## 2016-03-22 LAB — CBC WITH DIFFERENTIAL/PLATELET
BASOS PCT: 3 %
Basophils Absolute: 0.1 10*3/uL (ref 0–0.1)
Eosinophils Absolute: 0.1 10*3/uL (ref 0–0.7)
Eosinophils Relative: 3 %
HEMATOCRIT: 34.1 % — AB (ref 35.0–47.0)
Hemoglobin: 11.4 g/dL — ABNORMAL LOW (ref 12.0–16.0)
Lymphocytes Relative: 18 %
Lymphs Abs: 0.5 10*3/uL — ABNORMAL LOW (ref 1.0–3.6)
MCH: 31.4 pg (ref 26.0–34.0)
MCHC: 33.5 g/dL (ref 32.0–36.0)
MCV: 93.8 fL (ref 80.0–100.0)
MONO ABS: 0.7 10*3/uL (ref 0.2–0.9)
Monocytes Relative: 24 %
NEUTROS PCT: 52 %
Neutro Abs: 1.5 10*3/uL (ref 1.4–6.5)
PLATELETS: 259 10*3/uL (ref 150–440)
RBC: 3.63 MIL/uL — AB (ref 3.80–5.20)
RDW: 14.8 % — AB (ref 11.5–14.5)
WBC: 2.8 10*3/uL — ABNORMAL LOW (ref 3.6–11.0)

## 2016-03-22 LAB — COMPREHENSIVE METABOLIC PANEL
ALBUMIN: 3.4 g/dL — AB (ref 3.5–5.0)
ALT: 11 U/L — AB (ref 14–54)
AST: 13 U/L — AB (ref 15–41)
Alkaline Phosphatase: 112 U/L (ref 38–126)
Anion gap: 10 (ref 5–15)
BILIRUBIN TOTAL: 0.8 mg/dL (ref 0.3–1.2)
BUN: 19 mg/dL (ref 6–20)
CHLORIDE: 104 mmol/L (ref 101–111)
CO2: 23 mmol/L (ref 22–32)
CREATININE: 0.72 mg/dL (ref 0.44–1.00)
Calcium: 8.2 mg/dL — ABNORMAL LOW (ref 8.9–10.3)
GFR calc Af Amer: >60 mL/min (ref >60)
GLUCOSE: 79 mg/dL (ref 65–99)
POTASSIUM: 4.2 mmol/L (ref 3.5–5.1)
Sodium: 137 mmol/L (ref 135–145)
Total Protein: 6.7 g/dL (ref 6.5–8.1)

## 2016-03-22 NOTE — Assessment & Plan Note (Addendum)
Multiple myeloma relapsed currently on pomolyst 4 mg 2 weeks on 2 week off- patient seems to be tolerating well without any major side effects- however confusion/? delirium  #  Most recent myeloma numbers in  sep  2017 M protein 0.5 g [stable]; kappa lambda light chain ratio= Normal.  Normal CBC CMP.  July 2017 skeletal survey stable bone lesions.  # s/p fall- mechanical as per pt. Recommend walker/cane.   # confusion/delirium ? Etiology.   # Continue pomolyst at the  2 weeks-on and 2 weeks off. current dose. She seems to be tolerating it well  # History of osteonecrosis of the jaw- not on Zometa.  # Follow-up in 4 weeks with labs. Will try to reach pt's family.

## 2016-03-22 NOTE — Progress Notes (Signed)
Patient believes her last pill of Pomlyst was on 02/24/16. Pt not a good historian on when she stopped and started. "I may have taken this when I was at the beach, I just don't know. I may have been out of my pomalyst when I was on my vacation at the beach last week. I can not remember."   She is certain that she is not taking the Pomalyst at this time. She knows that a note was left on her front door by the mail delivery stating that she missed a delivery. She assumes that this was the Pomalyst."  Pt has fallen multiple times and scrapped her right elbow -2 weeks ago.

## 2016-03-22 NOTE — Progress Notes (Signed)
[facility] OFFICE PROGRESS NOTE  Patient Care Team: Derinda Late, MD as PCP - General (Family Medicine) Forest Gleason, MD (Oncology) Seeplaputhur Robinette Haines, MD (General Surgery)  No matching staging information was found for the patient.   Oncology History   1. Stage III Multiple Myeloma a. 11/20/09: SPEP showed quantitative IgG 4567 mg/dL with M spike of 3.8 g/dL and IFE showing IgG monoclonal protein with light chain specificity.  [place] urine protein electrophoresis showed M spike 27% Bence Jones Protein.  a. 11/28/09: bone marrow biopsy: - PLASMA CELL DYSCRASIA CONSISTENT WITH MULTIPLE MYELOMA (55%PLASMA CELLS ON ASPIRATE, 75% BY CD138 IMMUNOHISTOCHEMISTRY,KAPPA RESTRICTED).TRILINEAGE HEMATOPOIESIS.46 XX,  Normal FISH panel. b. 11/29/09: bone survey: There are numerous, scattered 2 mm to 5 mm concentric  radiolucencies in the bony calvaria.   Two or three similar focal lucent areas in the right  femur.  There are observed changes consistent with disc disease at multiple levels of the cervical spine and multiple levels of the lumbar spine. c. 12/07/09: right humerus: Multiple tiny lucencies noted throughout the right humerus including humeral diaphysis. No fracture. d. 8/11- 4/12 Mephalan/prednisone/velcade X 6 cycles with decrease in Mspike to 1.0 gm/dl. e.osteonecrosis of jaw and diagnosed in December of 2013(Zometa was discontinued) 7.Patient was started on Velcade and Decadron   because of progressive multiple myeloma from January of 2013. 8. Started on Kyprolis 05/13 9.progressing disease on kyphorilis September of 2013 10 patient has been started on [name] daily for 21 days and one week off Decadron day1, 18 and 15   with 4 week cycle is finished palliative radiation therapy without much relief in pain(November, 2013) 11.Palliative radiation therapy to the right shoulder (December, 2014) 12 patient was on break from pomolidomide because of infection and it has  been presumed from December 05, 2014  # July 2017- skeletal survey- stable bone lesions.      Multiple myeloma in relapse ([place])   11/20/2009 Initial Diagnosis    Multiple myeloma in relapse        INTERVAL HISTORY:  [name] 82 y.o.  female pleasant patient above history of Multiple myeloma currently on pomolyst is here for follow-up/Accompanied by a friend.  Patient had episodes of confusion/ accused her neighbor of stealing her medications. She does not remember when she stopped her pomalyst. As per the friend- patient had a recent fall mechanical.    Appetite is good. No nausea no vomiting. No skin rash. No diarrhea. No chest pain or shortness of breath or cough. Denies any tingling or numbness.  REVIEW OF SYSTEMS:  A complete 10 point review of system is done which is negative except mentioned above/history of present illness.   PAST MEDICAL HISTORY :  Past Medical History:  Diagnosis Date  . Anxiety   . Arthritis   . Atrial fibrillation ([place])   . Coronary arteriosclerosis   . History of blood transfusion 2014  . Hyperlipidemia   . Hypertension   . Multiple myeloma ([place])   . Multiple myeloma in relapse ([place]) 10/29/2014  . Osteonecrosis of jaw ([place])    left    PAST SURGICAL HISTORY :   Past Surgical History:  Procedure Laterality Date  . ABDOMINAL HYSTERECTOMY    . APPENDECTOMY    . PARTIAL COLECTOMY    . TOTAL KNEE REVISION Left     FAMILY HISTORY :   Family History  Problem Relation Age of Onset  . Cancer Father   . Heart attack Mother  SOCIAL HISTORY:   Social History  Substance Use Topics  . Smoking status: Never Smoker  . Smokeless tobacco: Never Used  . Alcohol use 1.2 oz/week    2 Glasses of wine per week    ALLERGIES:  is allergic to biaxin [clarithromycin]; demerol [meperidine]; hydrocodone; sulfa antibiotics; tramadol; and ultracet [tramadol-acetaminophen].  MEDICATIONS:  Current Outpatient Prescriptions  Medication Sig Dispense  Refill  . Biotin 1 MG CAPS Take by mouth.    . dexamethasone (DECADRON) 4 MG tablet Take 5 tablets (20 mg total) by mouth daily. Take on day 1, day 8, and day 15. (Patient not taking: Reported on 03/22/2016) 15 tablet 0  . POMALYST 4 MG capsule Take 1 capsule (4 mg total) by mouth daily. For 2 weeks then 2 weeks off. (Patient not taking: Reported on 03/22/2016) 14 capsule 6   No current facility-administered medications for this visit.    Facility-Administered Medications Ordered in Other Visits  Medication Dose Route Frequency Provider Last Rate Last Dose  . sodium chloride 0.9 % injection 10 mL  10 mL Intravenous PRN Forest Gleason, MD   10 mL at 03/01/15 1305    PHYSICAL EXAMINATION: ECOG PERFORMANCE STATUS: 0 - Asymptomatic  BP (!) 150/72 (BP Location: Left Arm, Patient Position: Sitting)   Pulse 65   Temp 97.8 F (36.6 C)   Resp 16   Ht '5\' 2"'  (1.575 m)   Wt 108 lb 6.4 oz (49.2 kg)   BMI 19.83 kg/m   Filed Weights   03/22/16 1203  Weight: 108 lb 6.4 oz (49.2 kg)    GENERAL: Well-nourished well-developed; Alert, no distress and comfortable. Accompanied by [name] Patient continues to complain of slight pain in her right hip she is taking Tylenol as needed. Currently helping.d. Patient is a wheelchair. EYES: no pallor or icterus OROPHARYNX: no thrush or ulceration; good dentition  NECK: supple, no masses felt LYMPH:  no palpable lymphadenopathy in the cervical, axillary or inguinal regions LUNGS: clear to auscultation and  No wheeze or crackles HEART/CVS: regular rate & rhythm and no murmurs; No lower extremity edema ABDOMEN:abdomen soft, non-tender and normal bowel sounds Musculoskeletal:no cyanosis of digits and no clubbing  PSYCH: alert & oriented x 3 with fluent speech NEURO: no focal motor/sensory deficits SKIN:  no rashes or significant lesions  LABORATORY DATA:  I have reviewed the data as listed    Component Value Date/Time   NA 137 03/22/2016 1055   NA 138  09/19/2014 1118   K 4.2 03/22/2016 1055   K 4.0 09/19/2014 1118   CL 104 03/22/2016 1055   CL 103 09/19/2014 1118   CO2 23 03/22/2016 1055   CO2 29 09/19/2014 1118   GLUCOSE 79 03/22/2016 1055   GLUCOSE 91 09/19/2014 1118   BUN 19 03/22/2016 1055   BUN 15 09/19/2014 1118   CREATININE 0.72 03/22/2016 1055   CREATININE 0.69 09/19/2014 1118   CALCIUM 8.2 (L) 03/22/2016 1055   CALCIUM 9.0 09/19/2014 1118   PROT 6.7 03/22/2016 1055   PROT 6.8 09/19/2014 1118   ALBUMIN 3.4 (L) 03/22/2016 1055   ALBUMIN 3.9 09/19/2014 1118   AST 13 (L) 03/22/2016 1055   AST 20 09/19/2014 1118   ALT 11 (L) 03/22/2016 1055   ALT 16 09/19/2014 1118   ALKPHOS 112 03/22/2016 1055   ALKPHOS 85 09/19/2014 1118   BILITOT 0.8 03/22/2016 1055   BILITOT 0.6 09/19/2014 1118   GFRNONAA >60 03/22/2016 1055   GFRNONAA >60 09/19/2014 1118  GFRAA >60 03/22/2016 1055   GFRAA >60 09/19/2014 1118    No results found for: SPEP, UPEP  Lab Results  Component Value Date   WBC 2.8 (L) 03/22/2016   NEUTROABS 1.5 03/22/2016   HGB 11.4 (L) 03/22/2016   HCT 34.1 (L) 03/22/2016   MCV 93.8 03/22/2016   PLT 259 03/22/2016      Chemistry      Component Value Date/Time   NA 137 03/22/2016 1055   NA 138 09/19/2014 1118   K 4.2 03/22/2016 1055   K 4.0 09/19/2014 1118   CL 104 03/22/2016 1055   CL 103 09/19/2014 1118   CO2 23 03/22/2016 1055   CO2 29 09/19/2014 1118   BUN 19 03/22/2016 1055   BUN 15 09/19/2014 1118   CREATININE 0.72 03/22/2016 1055   CREATININE 0.69 09/19/2014 1118      Component Value Date/Time   CALCIUM 8.2 (L) 03/22/2016 1055   CALCIUM 9.0 09/19/2014 1118   ALKPHOS 112 03/22/2016 1055   ALKPHOS 85 09/19/2014 1118   AST 13 (L) 03/22/2016 1055   AST 20 09/19/2014 1118   ALT 11 (L) 03/22/2016 1055   ALT 16 09/19/2014 1118   BILITOT 0.8 03/22/2016 1055   BILITOT 0.6 09/19/2014 1118       RADIOGRAPHIC STUDIES: I have personally reviewed the radiological images as listed and agreed  with the findings in the report. No results found.   ASSESSMENT & PLAN:  [place]) Multiple myeloma relapsed currently on pomolyst 4 mg 2 weeks on 2 week off- patient seems to be tolerating well without any major side effects- however confusion/? delirium  #  Most recent myeloma numbers in  sep  2017 M protein 0.5 g [stable]; kappa lambda light chain ratio= Normal.  Normal CBC CMP.  July 2017 skeletal survey stable bone lesions.  # s/p fall- mechanical as per pt. Recommend walker/cane.   # confusion/delirium ? Etiology.   # Continue pomolyst at the  2 weeks-on and 2 weeks off. current dose. She seems to be tolerating it well  # History of osteonecrosis of the jaw- not on Zometa.  # Follow-up in 4 weeks with labs. Will try to reach pt's family.   Orders Placed This Encounter  Procedures  . CBC with Differential    Standing Status:   Future    Standing Expiration Date:   03/22/2017  . Comprehensive metabolic panel    Standing Status:   Future    Standing Expiration Date:   03/22/2017  . Kappa/lambda light chains    Standing Status:   Future    Standing Expiration Date:   03/22/2017  . Multiple Myeloma Panel (SPEP&IFE w/QIG)    Standing Status:   Future    Standing Expiration Date:   03/22/2017   All questions were answered. The patient knows to call the clinic with any problems, questions or concerns.      Cammie Sickle, MD 03/23/2016 12:07 PM

## 2016-03-25 ENCOUNTER — Other Ambulatory Visit: Payer: Self-pay | Admitting: *Deleted

## 2016-03-25 LAB — KAPPA/LAMBDA LIGHT CHAINS
KAPPA FREE LGHT CHN: 27 mg/L — AB (ref 3.3–19.4)
Kappa, lambda light chain ratio: 1.54 (ref 0.26–1.65)
Lambda free light chains: 17.5 mg/L (ref 5.7–26.3)

## 2016-03-25 MED ORDER — DEXAMETHASONE 4 MG PO TABS: 20.0000 mg | ORAL_TABLET | Freq: Every day | ORAL | 0 refills | Status: DC

## 2016-03-26 ENCOUNTER — Telehealth: Payer: Self-pay | Admitting: Internal Medicine

## 2016-03-26 DIAGNOSIS — Z599 Problem related to housing and economic circumstances, unspecified: Secondary | ICD-10-CM

## 2016-03-26 DIAGNOSIS — Z598 Other problems related to housing and economic circumstances: Secondary | ICD-10-CM

## 2016-03-26 LAB — MULTIPLE MYELOMA PANEL, SERUM
ALBUMIN/GLOB SERPL: 1.1 (ref 0.7–1.7)
ALPHA 1: 0.3 g/dL (ref 0.0–0.4)
ALPHA2 GLOB SERPL ELPH-MCNC: 0.7 g/dL (ref 0.4–1.0)
Albumin SerPl Elph-Mcnc: 3 g/dL (ref 2.9–4.4)
B-GLOBULIN SERPL ELPH-MCNC: 0.9 g/dL (ref 0.7–1.3)
Gamma Glob SerPl Elph-Mcnc: 0.8 g/dL (ref 0.4–1.8)
Globulin, Total: 2.8 g/dL (ref 2.2–3.9)
IGG (IMMUNOGLOBIN G), SERUM: 750 mg/dL (ref 700–1600)
IgA: 66 mg/dL (ref 64–422)
IgM, Serum: 177 mg/dL (ref 26–217)
M PROTEIN SERPL ELPH-MCNC: 0.5 g/dL — AB
Total Protein ELP: 5.8 g/dL — ABNORMAL LOW (ref 6.0–8.5)

## 2016-03-26 NOTE — Telephone Encounter (Signed)
Unable to reach family member. Referral sent to [facility] to discuss financial needs.

## 2016-03-26 NOTE — Telephone Encounter (Signed)
Please contact pt family. They need assistance getting her insurance to cover her caregiver assistance needs. Family has some general questions. Thank you

## 2016-03-27 ENCOUNTER — Telehealth: Payer: Self-pay | Admitting: *Deleted

## 2016-03-27 NOTE — Progress Notes (Unsigned)
PSN called patient to discuss any concerns she had.  Patient reported that she had no needs or concerns.  Patient seemed perplexed as to why PSN called.  Patient informed to call back if needs arose.

## 2016-03-27 NOTE — Telephone Encounter (Signed)
Patient called to report that she still has not gotten her "pills" Pomalyst or Dexamethasone. And cannot remember that last time she took any, it was before or when she was at the beach. I discussed with Dr Rogue Bussing, he needs to speak with son.  I called [facility] who states there was shipment made to her on 9/25 and they do not see that it was returned for non receipt. They have this months prescription on the pharmacists desk and plan to reach out to her for shipment in the next couple of days. I asked that they contact her son Merry Proud instead of her for delivery. I then attempted to call Merry Proud her son and left a message for him to return my call.

## 2016-03-28 NOTE — Telephone Encounter (Signed)
I sent the pomlyst request to [facility] and completed the Prescriber's survey on 10/19

## 2016-04-02 ENCOUNTER — Telehealth: Payer: Self-pay | Admitting: *Deleted

## 2016-04-02 NOTE — Telephone Encounter (Signed)
Per Dr Grayland Ormond, restart Pomalyst Friday, we can order Home Health, per Merry Proud they wants to wait for home health referral until he can come in to speak with Dr Rogue Bussing

## 2016-04-02 NOTE — Telephone Encounter (Signed)
Called to report that in his mothers confusion, she took 4 days worth of Pomalyst in 24 hours. He had set up a pill box for her and took the remaining pills home with him and when family went by this morning to check on her, it was noted that she had taken the other 3 pills since yesterday mornings dose. The pill box has been removed form the home and they will give her meds daily. He wants to meet with Dr B to discuss getting a nurse in the home M-F as she has a long term care insurance policy which will cover it. Asking what to do regarding the over dose of her Pomalyst. Pleasae advise

## 2016-04-04 ENCOUNTER — Ambulatory Visit
Admission: RE | Admit: 2016-04-04 | Discharge: 2016-04-04 | Disposition: A | Discharge status: Home / Self Care | Payer: Medicare Other | Source: Ambulatory Visit | Attending: Internal Medicine | Admitting: Internal Medicine

## 2016-04-04 ENCOUNTER — Telehealth: Payer: Self-pay | Admitting: Internal Medicine

## 2016-04-04 ENCOUNTER — Other Ambulatory Visit: Payer: Self-pay | Admitting: Internal Medicine

## 2016-04-04 ENCOUNTER — Telehealth: Payer: Self-pay | Admitting: *Deleted

## 2016-04-04 DIAGNOSIS — M5440 Lumbago with sciatica, unspecified side: Secondary | ICD-10-CM

## 2016-04-04 DIAGNOSIS — C9000 Multiple myeloma not having achieved remission: Secondary | ICD-10-CM

## 2016-04-04 DIAGNOSIS — M4856XA Collapsed vertebra, not elsewhere classified, lumbar region, initial encounter for fracture: Secondary | ICD-10-CM | POA: Diagnosis not present

## 2016-04-04 MED ORDER — IBUPROFEN 200 MG PO TABS: 200.0000 mg | ORAL_TABLET | Freq: Three times a day (TID) | ORAL | 0 refills | Status: DC

## 2016-04-04 NOTE — Telephone Encounter (Signed)
Spoke to son re: results of x-rays; and recommendations to trial of motrin 200 TID x 3 days; he agrees.

## 2016-04-04 NOTE — Telephone Encounter (Signed)
Patient almost in tears with low back, hip and buttock pain. Asking if we could order something mild for pain. He reports that he has someone staying with her most of the day. Please advise.

## 2016-04-04 NOTE — Telephone Encounter (Signed)
Spoke to son Alyson Lykken- briefly Steele Sizer was driving bus]; at [phone]; he will call us back on 10/30 to discuss re: his mother plan of care.

## 2016-04-04 NOTE — Telephone Encounter (Signed)
Per Dr Rogue Bussing, Get xray of her thoracolumbar spine, Motrin 200 mg tid times 3 days only. Instructions given to Merry Proud and he will try to have someone bring her over for Xray today if possible, He will get her Motrin 200 mg three times a day for three days I stressed for only 3 days and he repeated back to me.

## 2016-04-05 MED ORDER — IBUPROFEN 200 MG PO TABS: 200.0000 mg | ORAL_TABLET | Freq: Three times a day (TID) | ORAL | 0 refills | Status: AC

## 2016-04-05 NOTE — Addendum Note (Signed)
Addended by: Betti Cruz on: 04/05/2016 09:41 AM   Modules accepted: Orders

## 2016-04-08 ENCOUNTER — Telehealth: Payer: Self-pay | Admitting: *Deleted

## 2016-04-08 ENCOUNTER — Ambulatory Visit
Admission: RE | Admit: 2016-04-08 | Discharge: 2016-04-08 | Disposition: A | Discharge status: Home / Self Care | Payer: Medicare Other | Source: Ambulatory Visit | Attending: Internal Medicine | Admitting: Internal Medicine

## 2016-04-08 DIAGNOSIS — M84454A Pathological fracture, pelvis, initial encounter for fracture: Secondary | ICD-10-CM | POA: Insufficient documentation

## 2016-04-08 DIAGNOSIS — M8448XA Pathological fracture, other site, initial encounter for fracture: Secondary | ICD-10-CM | POA: Diagnosis not present

## 2016-04-08 DIAGNOSIS — M810 Age-related osteoporosis without current pathological fracture: Secondary | ICD-10-CM | POA: Diagnosis not present

## 2016-04-08 DIAGNOSIS — S92591A Other fracture of right lesser toe(s), initial encounter for closed fracture: Secondary | ICD-10-CM | POA: Diagnosis not present

## 2016-04-08 DIAGNOSIS — C9000 Multiple myeloma not having achieved remission: Secondary | ICD-10-CM

## 2016-04-08 MED ORDER — OXYCODONE HCL 5 MG PO TABS: 2.5000 mg | ORAL_TABLET | Freq: Three times a day (TID) | ORAL | 0 refills | Status: DC | PRN

## 2016-04-08 NOTE — Telephone Encounter (Signed)
[name] has been in pain all weekend. Has been giving her Motrin 200 mg 6 times a day and Tylenol also. States she is specifically complaining of pain in the right hip. Asking if she can get her hip x rayed today. Also requesting pain med for her, they have someone to stay with her if she gets med. Please advise.

## 2016-04-08 NOTE — Telephone Encounter (Signed)
Per Dr Rogue Bussing, he wants her to try the Oxycodone reasons explained to [facility] and he said they will try it

## 2016-04-08 NOTE — Telephone Encounter (Signed)
Per Dr Daphine Deutscher R hip, oxycodone 2.5 mg q 8 h prn # 30. Merry Proud informed and will have [name] bring her for Xray and pick up rx

## 2016-04-08 NOTE — Telephone Encounter (Signed)
[name] called back to voice concern over her sensitivity to the "codones" of nausea and vomiting. He states when she had her surgery, they gave her a patch per chart she was on Fentanyl 25 mcg

## 2016-04-09 ENCOUNTER — Telehealth: Payer: Self-pay | Admitting: Internal Medicine

## 2016-04-09 ENCOUNTER — Telehealth: Payer: Self-pay | Admitting: *Deleted

## 2016-04-09 NOTE — Telephone Encounter (Signed)
Appt made for 11/13 @ 3 PM with Marylee Floras PA ortho. Merry Proud informed of results, and of appt date and tie and is in agreement with appt

## 2016-04-09 NOTE — Telephone Encounter (Signed)
See results of hip xray  CLINICAL DATA:  Multiple myeloma. One day history of right-sided pain  EXAM: DG HIP (WITH OR WITHOUT PELVIS) 2-3V RIGHT  COMPARISON:  None.  FINDINGS: Frontal pelvis as well as frontal and lateral right hip images were obtained. There is a fracture through the lateral aspect of the right pubic symphysis with alignment near anatomic. This fracture extends into the medial aspect of the superior pubic ramus on the right with alignment near anatomic. No other fracture is evident. No dislocation. Bones are osteoporotic. Lytic lesions are noted in each ischium as well as in the right lateral pubic symphysis region. A much smaller lytic lesion is noted in the left pubic ramus. A small lytic lesion is also noted in the proximal right femoral diaphysis. There is mild symmetric narrowing of both hip joints.  IMPRESSION: Fracture of the lateral aspect of the right pubic symphysis extending into the medial superior pubic ramus on the right with near anatomic alignment in the area of fracture. No other fracture. No dislocation. Diffuse osteoporosis. Several lytic lesions consistent with known multiple myeloma apparent. There is mild symmetric narrowing of both hip joints.  These results will be called to the ordering clinician or representative by the Radiologist Assistant, and communication documented in the PACS or zVision Dashboard.   Electronically Signed   By: Lowella Grip III M.D.   On: 04/09/2016 08:14

## 2016-04-09 NOTE — Telephone Encounter (Signed)
Left message for Marylee Floras PA to call me back re: pt x-rays. Pt had sen in past in august 2017.   Hassan Rowan- please inform pt's son re: plan; and also refer pt back to see Marylee Floras PA.

## 2016-04-09 NOTE — Telephone Encounter (Signed)
Spoke to Marylee Floras; [facility] ortho- will plan to see pt soon. Dr.B

## 2016-04-11 ENCOUNTER — Telehealth: Payer: Self-pay | Admitting: *Deleted

## 2016-04-11 MED ORDER — OXYCODONE HCL ER 10 MG PO T12A: 10.0000 mg | EXTENDED_RELEASE_TABLET | Freq: Every day | ORAL | 0 refills | Status: DC

## 2016-04-11 NOTE — Telephone Encounter (Signed)
Reports that his mother goes to bed at 830 and takes her pain med at that time, when she gets up in the morning 11 - 12 hours later, her med has worn off and she is inpoain until lunchtime, asking if he can give a wqhole tablet of oxycodone to her at bedtime instead of half, I suggested she may need a long acting med dure to the fact that doubling the dose would also wear off by the morning when she awakens. He asked that we look into ordering something long acting for her pain.

## 2016-04-11 NOTE — Telephone Encounter (Signed)
Oxycodone ER 10 mg q hs ordered, PA has been initiated

## 2016-04-12 MED ORDER — MORPHINE SULFATE ER 15 MG PO TBCR: 15.0000 mg | EXTENDED_RELEASE_TABLET | Freq: Every day | ORAL | 0 refills | Status: DC

## 2016-04-12 MED ORDER — PROCHLORPERAZINE MALEATE 10 MG PO TABS: 10.0000 mg | ORAL_TABLET | Freq: Three times a day (TID) | ORAL | 1 refills | Status: DC | PRN

## 2016-04-12 NOTE — Addendum Note (Signed)
Addended by: Betti Cruz on: 04/12/2016 04:20 PM   Modules accepted: Orders

## 2016-04-15 ENCOUNTER — Telehealth: Payer: Self-pay | Admitting: *Deleted

## 2016-04-15 MED ORDER — FENTANYL 12 MCG/HR TD PT72: 12.5000 ug | MEDICATED_PATCH | TRANSDERMAL | 0 refills | Status: DC

## 2016-04-15 NOTE — Telephone Encounter (Signed)
Fentanyl 12 mcg per Dr Rogue Bussing, Donella Stade informed

## 2016-04-15 NOTE — Telephone Encounter (Signed)
Morphine not controlling pain and has made her mind crazy. She is attempting to walk the floors because of the pain, but is unable to walk much. Asking for something else for pain and if her ortho appt can be moved up.  I was able to call [facility] ortho and get her appt for tomorrow morning at 8:30. [name] informed and asked for directoins and what she needed to take with her. I told her her med list, insurance card and photo ID and explained where to go for appt.  Please advise regarding her pain med

## 2016-04-16 ENCOUNTER — Ambulatory Visit: Payer: Medicare Other | Admitting: Internal Medicine

## 2016-04-16 DIAGNOSIS — S3282XA Multiple fractures of pelvis without disruption of pelvic ring, initial encounter for closed fracture: Secondary | ICD-10-CM | POA: Diagnosis not present

## 2016-04-16 DIAGNOSIS — R102 Pelvic and perineal pain: Secondary | ICD-10-CM | POA: Diagnosis not present

## 2016-04-19 ENCOUNTER — Ambulatory Visit: Payer: Medicare Other | Admitting: Internal Medicine

## 2016-04-19 ENCOUNTER — Other Ambulatory Visit: Payer: Medicare Other

## 2016-04-22 ENCOUNTER — Inpatient Hospital Stay: Payer: Medicare Other | Attending: Internal Medicine

## 2016-04-22 DIAGNOSIS — I10 Essential (primary) hypertension: Secondary | ICD-10-CM | POA: Diagnosis not present

## 2016-04-22 DIAGNOSIS — F419 Anxiety disorder, unspecified: Secondary | ICD-10-CM | POA: Diagnosis not present

## 2016-04-22 DIAGNOSIS — Z8781 Personal history of (healed) traumatic fracture: Secondary | ICD-10-CM | POA: Diagnosis not present

## 2016-04-22 DIAGNOSIS — K5900 Constipation, unspecified: Secondary | ICD-10-CM | POA: Insufficient documentation

## 2016-04-22 DIAGNOSIS — M898X8 Other specified disorders of bone, other site: Secondary | ICD-10-CM | POA: Diagnosis not present

## 2016-04-22 DIAGNOSIS — Z809 Family history of malignant neoplasm, unspecified: Secondary | ICD-10-CM | POA: Diagnosis not present

## 2016-04-22 DIAGNOSIS — M129 Arthropathy, unspecified: Secondary | ICD-10-CM | POA: Diagnosis not present

## 2016-04-22 DIAGNOSIS — E785 Hyperlipidemia, unspecified: Secondary | ICD-10-CM | POA: Insufficient documentation

## 2016-04-22 DIAGNOSIS — Z923 Personal history of irradiation: Secondary | ICD-10-CM | POA: Insufficient documentation

## 2016-04-22 DIAGNOSIS — R410 Disorientation, unspecified: Secondary | ICD-10-CM | POA: Insufficient documentation

## 2016-04-22 DIAGNOSIS — Z79899 Other long term (current) drug therapy: Secondary | ICD-10-CM | POA: Insufficient documentation

## 2016-04-22 DIAGNOSIS — Z9221 Personal history of antineoplastic chemotherapy: Secondary | ICD-10-CM | POA: Insufficient documentation

## 2016-04-22 DIAGNOSIS — C9002 Multiple myeloma in relapse: Secondary | ICD-10-CM | POA: Diagnosis not present

## 2016-04-22 DIAGNOSIS — I2510 Atherosclerotic heart disease of native coronary artery without angina pectoris: Secondary | ICD-10-CM | POA: Diagnosis not present

## 2016-04-22 DIAGNOSIS — I4891 Unspecified atrial fibrillation: Secondary | ICD-10-CM | POA: Insufficient documentation

## 2016-04-22 LAB — CBC WITH DIFFERENTIAL/PLATELET
BASOS ABS: 0 10*3/uL (ref 0–0.1)
BASOS PCT: 1 %
EOS PCT: 7 %
Eosinophils Absolute: 0.2 10*3/uL (ref 0–0.7)
HEMATOCRIT: 33.4 % — AB (ref 35.0–47.0)
Hemoglobin: 11.1 g/dL — ABNORMAL LOW (ref 12.0–16.0)
LYMPHS PCT: 25 %
Lymphs Abs: 0.8 10*3/uL — ABNORMAL LOW (ref 1.0–3.6)
MCH: 30.5 pg (ref 26.0–34.0)
MCHC: 33.3 g/dL (ref 32.0–36.0)
MCV: 91.7 fL (ref 80.0–100.0)
Monocytes Absolute: 1 10*3/uL — ABNORMAL HIGH (ref 0.2–0.9)
Monocytes Relative: 34 %
NEUTROS ABS: 1 10*3/uL — AB (ref 1.4–6.5)
Neutrophils Relative %: 33 %
PLATELETS: 277 10*3/uL (ref 150–440)
RBC: 3.65 MIL/uL — AB (ref 3.80–5.20)
RDW: 15.2 % — ABNORMAL HIGH (ref 11.5–14.5)
WBC: 3.1 10*3/uL — AB (ref 3.6–11.0)

## 2016-04-22 LAB — COMPREHENSIVE METABOLIC PANEL
ALBUMIN: 3.1 g/dL — AB (ref 3.5–5.0)
ALT: 10 U/L — AB (ref 14–54)
AST: 13 U/L — AB (ref 15–41)
Alkaline Phosphatase: 132 U/L — ABNORMAL HIGH (ref 38–126)
Anion gap: 8 (ref 5–15)
BUN: 18 mg/dL (ref 6–20)
CHLORIDE: 97 mmol/L — AB (ref 101–111)
CO2: 27 mmol/L (ref 22–32)
CREATININE: 0.58 mg/dL (ref 0.44–1.00)
Calcium: 8.6 mg/dL — ABNORMAL LOW (ref 8.9–10.3)
GFR calc Af Amer: >60 mL/min (ref >60)
GLUCOSE: 93 mg/dL (ref 65–99)
Potassium: 3.8 mmol/L (ref 3.5–5.1)
Sodium: 132 mmol/L — ABNORMAL LOW (ref 135–145)
Total Bilirubin: 0.3 mg/dL (ref 0.3–1.2)
Total Protein: 6.3 g/dL — ABNORMAL LOW (ref 6.5–8.1)

## 2016-04-23 LAB — KAPPA/LAMBDA LIGHT CHAINS
KAPPA, LAMDA LIGHT CHAIN RATIO: 1.9 — AB (ref 0.26–1.65)
Kappa free light chain: 19.2 mg/L (ref 3.3–19.4)
LAMDA FREE LIGHT CHAINS: 10.1 mg/L (ref 5.7–26.3)

## 2016-04-24 ENCOUNTER — Inpatient Hospital Stay: Payer: Medicare Other | Admitting: Internal Medicine

## 2016-04-24 ENCOUNTER — Other Ambulatory Visit: Payer: Self-pay | Admitting: *Deleted

## 2016-04-24 DIAGNOSIS — C9000 Multiple myeloma not having achieved remission: Secondary | ICD-10-CM

## 2016-04-24 LAB — MULTIPLE MYELOMA PANEL, SERUM
Albumin SerPl Elph-Mcnc: 2.7 g/dL — ABNORMAL LOW (ref 2.9–4.4)
Albumin/Glob SerPl: 1.1 (ref 0.7–1.7)
Alpha 1: 0.3 g/dL (ref 0.0–0.4)
Alpha2 Glob SerPl Elph-Mcnc: 0.7 g/dL (ref 0.4–1.0)
B-GLOBULIN SERPL ELPH-MCNC: 0.9 g/dL (ref 0.7–1.3)
GAMMA GLOB SERPL ELPH-MCNC: 0.8 g/dL (ref 0.4–1.8)
GLOBULIN, TOTAL: 2.7 g/dL (ref 2.2–3.9)
IgA: 47 mg/dL — ABNORMAL LOW (ref 64–422)
IgG (Immunoglobin G), Serum: 753 mg/dL (ref 700–1600)
IgM, Serum: 82 mg/dL (ref 26–217)
M PROTEIN SERPL ELPH-MCNC: 0.5 g/dL — AB
Total Protein ELP: 5.4 g/dL — ABNORMAL LOW (ref 6.0–8.5)

## 2016-04-24 MED ORDER — OXYCODONE HCL 5 MG PO TABS: 2.5000 mg | ORAL_TABLET | Freq: Three times a day (TID) | ORAL | 0 refills | Status: DC | PRN

## 2016-04-24 NOTE — Progress Notes (Signed)
Met with family- son-Jeff [health care attorney]/ daughter-in-law [name] in [place]]/ family. Significant decline in cognitive function [multiple examples- forgetting/procrastinating pay her bills; falsely accusing neighbor of stealing her medications/money; also taking medications-pomalyst inappropriately; trying to try her cell phone in a microwave; putting her oxycodone pain medication in the coffee ]. Family very concerned about her living by herself. They're having a caregiver stay with her during the morning hours. Also feel that she might need 24-hour assistance moving forward. Discussed the DO NOT RESUSCITATE; patient hadn't clearly expressed her wishes for DO NOT RESUSCITATE/DO NOT INTUBATE with his son Merry Proud who has the power of her health care.   # Risk of falls- discussed family regarding use of walker.  # Poor pain control- we will increase the dose of fentanyl to 25 [will give prescription more]; oxycodone for breakthrough pain.

## 2016-04-24 NOTE — Progress Notes (Signed)
Family is here today without patient

## 2016-04-25 ENCOUNTER — Encounter: Payer: Self-pay | Admitting: *Deleted

## 2016-04-25 ENCOUNTER — Inpatient Hospital Stay (HOSPITAL_BASED_OUTPATIENT_CLINIC_OR_DEPARTMENT_OTHER): Payer: Medicare Other | Admitting: Internal Medicine

## 2016-04-25 ENCOUNTER — Other Ambulatory Visit: Payer: Medicare Other

## 2016-04-25 VITALS — BP 116/68 | HR 77 | Temp 97.4°F | Resp 18 | Wt 105.6 lb

## 2016-04-25 DIAGNOSIS — I4891 Unspecified atrial fibrillation: Secondary | ICD-10-CM

## 2016-04-25 DIAGNOSIS — E785 Hyperlipidemia, unspecified: Secondary | ICD-10-CM

## 2016-04-25 DIAGNOSIS — C9002 Multiple myeloma in relapse: Secondary | ICD-10-CM

## 2016-04-25 DIAGNOSIS — Z923 Personal history of irradiation: Secondary | ICD-10-CM

## 2016-04-25 DIAGNOSIS — I2510 Atherosclerotic heart disease of native coronary artery without angina pectoris: Secondary | ICD-10-CM

## 2016-04-25 DIAGNOSIS — M129 Arthropathy, unspecified: Secondary | ICD-10-CM

## 2016-04-25 DIAGNOSIS — M898X8 Other specified disorders of bone, other site: Secondary | ICD-10-CM | POA: Diagnosis not present

## 2016-04-25 DIAGNOSIS — Z809 Family history of malignant neoplasm, unspecified: Secondary | ICD-10-CM

## 2016-04-25 DIAGNOSIS — R410 Disorientation, unspecified: Secondary | ICD-10-CM

## 2016-04-25 DIAGNOSIS — F419 Anxiety disorder, unspecified: Secondary | ICD-10-CM | POA: Diagnosis not present

## 2016-04-25 DIAGNOSIS — I10 Essential (primary) hypertension: Secondary | ICD-10-CM

## 2016-04-25 DIAGNOSIS — Z8781 Personal history of (healed) traumatic fracture: Secondary | ICD-10-CM

## 2016-04-25 DIAGNOSIS — K5900 Constipation, unspecified: Secondary | ICD-10-CM

## 2016-04-25 DIAGNOSIS — Z9221 Personal history of antineoplastic chemotherapy: Secondary | ICD-10-CM

## 2016-04-25 DIAGNOSIS — Z79899 Other long term (current) drug therapy: Secondary | ICD-10-CM

## 2016-04-25 DIAGNOSIS — C9000 Multiple myeloma not having achieved remission: Secondary | ICD-10-CM

## 2016-04-25 MED ORDER — FENTANYL 25 MCG/HR TD PT72: 25.0000 ug | MEDICATED_PATCH | TRANSDERMAL | 0 refills | Status: DC

## 2016-04-25 NOTE — Assessment & Plan Note (Addendum)
Multiple myeloma relapsed currently on pomolyst 4 mg 2 weeks on 2 week off- patient seems to be tolerating well without any major side effects- however noted to have worsening cognitive decline.   #  Most recent myeloma numbers in OCT  2017 M protein 0.5 g [stable]; kappa lambda light chain ratio= slightly abnormal. .  Normal CBC CMP.  July 2017 skeletal survey stable bone lesions. Restart pomolyst 2 weeks on and 2 weeks off next week.  # Pain control/pelvic fracture-conservative management; recommend increasing the fentanyl patch 25 and also using oxycodone 5 mg; one to half a pill every 4-6 hours. Discussed regarding MiraLAX Colace for constipation.  # Discontinue dexamethasone; given her history of confusion.  # confusion/delirium/ slow cognitive decline- discussed at length with the patient and family. Patient is frustrated that her family is taking over for her financial independence. Patient will need 24-hour assistance at home-given high risk for falls/self-harm. I long discussion the patient regarding getting a family help her.   # History of osteonecrosis of the jaw- not on Zometa.  # Follow-up in 4 weeks with labs/ myeloma labs.   # 40 minutes face-to-face with the patient discussing the above plan of care; more than 50% of time spent on prognosis/ natural history; counseling and coordination.

## 2016-04-25 NOTE — Progress Notes (Signed)
[name] OFFICE PROGRESS NOTE  Patient Care Team: Derinda Late, MD as PCP - General (Family Medicine) Forest Gleason, MD (Oncology) Seeplaputhur Robinette Haines, MD (General Surgery)  No matching staging information was found for the patient.   Oncology History   1. Stage III Multiple Myeloma a. 11/20/09: SPEP showed quantitative IgG 4567 mg/dL with M spike of 3.8 g/dL and IFE showing IgG monoclonal protein with light chain specificity.  [place] urine protein electrophoresis showed M spike 27% Bence Jones Protein.  a. 11/28/09: bone marrow biopsy: - PLASMA CELL DYSCRASIA CONSISTENT WITH MULTIPLE MYELOMA (55%PLASMA CELLS ON ASPIRATE, 75% BY CD138 IMMUNOHISTOCHEMISTRY,KAPPA RESTRICTED).TRILINEAGE HEMATOPOIESIS.46 XX,  Normal FISH panel. b. 11/29/09: bone survey: There are numerous, scattered 2 mm to 5 mm concentric  radiolucencies in the bony calvaria.   Two or three similar focal lucent areas in the right  femur.  There are observed changes consistent with disc disease at multiple levels of the cervical spine and multiple levels of the lumbar spine. c. 12/07/09: right humerus: Multiple tiny lucencies noted throughout the right humerus including humeral diaphysis. No fracture. d. 8/11- 4/12 Mephalan/prednisone/velcade X 6 cycles with decrease in Mspike to 1.0 gm/dl. e.osteonecrosis of jaw and diagnosed in December of 2013(Zometa was discontinued) 7.Patient was started on Velcade and Decadron   because of progressive multiple myeloma from January of 2013. 8. Started on Kyprolis 05/13 9.progressing disease on kyphorilis September of 2013 10 patient has been started on [name] daily for 21 days and one week off Decadron day1, 18 and 15   with 4 week cycle is finished palliative radiation therapy without much relief in pain(November, 2013) 11.Palliative radiation therapy to the right shoulder (December, 2014) 12 patient was on break from pomolidomide because of infection and it has  been presumed from December 05, 2014  # July 2017- skeletal survey- stable bone lesions.      Multiple myeloma not having achieved remission ([place])   11/20/2009 Initial Diagnosis    Multiple myeloma in relapse        INTERVAL HISTORY:  [name] 82 y.o.  female pleasant patient above history of Multiple myeloma currently on pomolyst is here for follow-up/Accompanied by a caregiver/daughter-in-law.  In the interim patient was diagnosed with right hip pelvic fracture after fall. She has been evaluated by orthopedics. Recommended meloxicam.Patient continues to complain of pain in her right hip.patient is currently on fentanyl 12 g; oxycodone 2.5 as every 6-8 hours.  Family is very concerned about patient being by herself. They give multiple  Examples re: Significant decline in cognitive function [multiple examples- forgetting/procrastinating pay her bills; falsely accusing neighbor of stealing her medications/money; also taking medications-pomalyst inappropriately; trying to try her cell phone in a microwave; putting her oxycodone pain medication in the coffee ]. Patient has been not following family recommendations of using a rolling walker; which puts her at high risk for significant falls.    Appetite is good. No nausea no vomiting. No skin rash. No diarrhea. No chest pain or shortness of breath or cough. Denies any tingling or numbness.  REVIEW OF SYSTEMS:  A complete 10 point review of system is done which is negative except mentioned above/history of present illness.   PAST MEDICAL HISTORY :  Past Medical History:  Diagnosis Date  . Anxiety   . Arthritis   . Atrial fibrillation ([place])   . Coronary arteriosclerosis   . History of blood transfusion 2014  . Hyperlipidemia   . Hypertension   . Multiple  myeloma ([place])   . Multiple myeloma in relapse ([place]) 10/29/2014  . Osteonecrosis of jaw ([place])    left    PAST SURGICAL HISTORY :   Past Surgical History:  Procedure  Laterality Date  . ABDOMINAL HYSTERECTOMY    . APPENDECTOMY    . PARTIAL COLECTOMY    . TOTAL KNEE REVISION Left     FAMILY HISTORY :   Family History  Problem Relation Age of Onset  . Cancer Father   . Heart attack Mother     SOCIAL HISTORY:   Social History  Substance Use Topics  . Smoking status: Never Smoker  . Smokeless tobacco: Never Used  . Alcohol use 1.2 oz/week    2 Glasses of wine per week    ALLERGIES:  is allergic to biaxin [clarithromycin]; demerol [meperidine]; hydrocodone; sulfa antibiotics; tramadol; and ultracet [tramadol-acetaminophen].  MEDICATIONS:  Current Outpatient Prescriptions  Medication Sig Dispense Refill  . Biotin 1 MG CAPS Take by mouth.    . dexamethasone (DECADRON) 4 MG tablet Take 5 tablets (20 mg total) by mouth daily. Take on day 1, day 8, and day 15. 15 tablet 0  . Docusate Sodium (COLACE PO) Take 2 tablets by mouth 2 (two) times daily.    . fentaNYL (DURAGESIC - DOSED MCG/HR) 25 MCG/HR patch Place 1 patch (25 mcg total) onto the skin every 3 (three) days. 10 patch 0  . meloxicam (MOBIC) 7.5 MG tablet Take by mouth.    . Multiple Vitamins-Minerals (CENTRUM SILVER 50+WOMEN PO) Take by mouth.    . oxyCODONE (OXY IR/ROXICODONE) 5 MG immediate release tablet Take 0.5 tablets (2.5 mg total) by mouth every 8 (eight) hours as needed for severe pain. 30 tablet 0  . POMALYST 4 MG capsule Take 1 capsule (4 mg total) by mouth daily. For 2 weeks then 2 weeks off. 14 capsule 6  . prochlorperazine (COMPAZINE) 10 MG tablet Take 1 tablet (10 mg total) by mouth every 8 (eight) hours as needed for nausea or vomiting. (Patient not taking: Reported on 04/25/2016) 30 tablet 1   No current facility-administered medications for this visit.    Facility-Administered Medications Ordered in Other Visits  Medication Dose Route Frequency Provider Last Rate Last Dose  . sodium chloride 0.9 % injection 10 mL  10 mL Intravenous PRN Forest Gleason, MD   10 mL at 03/01/15  1305    PHYSICAL EXAMINATION: ECOG PERFORMANCE STATUS: 0 - Asymptomatic  BP 116/68 (BP Location: Right Arm, Patient Position: Sitting)   Pulse 77   Temp 97.4 F (36.3 C) (Tympanic)   Resp 18   Wt 105 lb 9.6 oz (47.9 kg)   BMI 19.31 kg/m   Filed Weights   04/25/16 1129  Weight: 105 lb 9.6 oz (47.9 kg)    GENERAL: frail-appearing elderly female patiett Alert, no distress and comfortable. Accompanied by her care giver/ daughter-in-law Patient is a wheelchair. EYES: no pallor or icterus OROPHARYNX: no thrush or ulceration; good dentition  NECK: supple, no masses felt LYMPH:  no palpable lymphadenopathy in the cervical, axillary or inguinal regions LUNGS: clear to auscultation and  No wheeze or crackles HEART/CVS: regular rate & rhythm and no murmurs; soft tissue swelling noted in the left lower extremity dorsal surface. No tenderness.  ABDOMEN:abdomen soft, non-tender and normal bowel sounds Musculoskeletal:no cyanosis of digits and no clubbing  PSYCH: alert & oriented x 3 with fluent speech NEURO: no focal motor/sensory deficits SKIN:  no rashes or significant lesions  LABORATORY DATA:  I have reviewed the data as listed    Component Value Date/Time   NA 132 (L) 04/22/2016 1355   NA 138 09/19/2014 1118   K 3.8 04/22/2016 1355   K 4.0 09/19/2014 1118   CL 97 (L) 04/22/2016 1355   CL 103 09/19/2014 1118   CO2 27 04/22/2016 1355   CO2 29 09/19/2014 1118   GLUCOSE 93 04/22/2016 1355   GLUCOSE 91 09/19/2014 1118   BUN 18 04/22/2016 1355   BUN 15 09/19/2014 1118   CREATININE 0.58 04/22/2016 1355   CREATININE 0.69 09/19/2014 1118   CALCIUM 8.6 (L) 04/22/2016 1355   CALCIUM 9.0 09/19/2014 1118   PROT 6.3 (L) 04/22/2016 1355   PROT 6.8 09/19/2014 1118   ALBUMIN 3.1 (L) 04/22/2016 1355   ALBUMIN 3.9 09/19/2014 1118   AST 13 (L) 04/22/2016 1355   AST 20 09/19/2014 1118   ALT 10 (L) 04/22/2016 1355   ALT 16 09/19/2014 1118   ALKPHOS 132 (H) 04/22/2016 1355   ALKPHOS 85  09/19/2014 1118   BILITOT 0.3 04/22/2016 1355   BILITOT 0.6 09/19/2014 1118   GFRNONAA >60 04/22/2016 1355   GFRNONAA >60 09/19/2014 1118   GFRAA >60 04/22/2016 1355   GFRAA >60 09/19/2014 1118    No results found for: SPEP, UPEP  Lab Results  Component Value Date   WBC 3.1 (L) 04/22/2016   NEUTROABS 1.0 (L) 04/22/2016   HGB 11.1 (L) 04/22/2016   HCT 33.4 (L) 04/22/2016   MCV 91.7 04/22/2016   PLT 277 04/22/2016      Chemistry      Component Value Date/Time   NA 132 (L) 04/22/2016 1355   NA 138 09/19/2014 1118   K 3.8 04/22/2016 1355   K 4.0 09/19/2014 1118   CL 97 (L) 04/22/2016 1355   CL 103 09/19/2014 1118   CO2 27 04/22/2016 1355   CO2 29 09/19/2014 1118   BUN 18 04/22/2016 1355   BUN 15 09/19/2014 1118   CREATININE 0.58 04/22/2016 1355   CREATININE 0.69 09/19/2014 1118      Component Value Date/Time   CALCIUM 8.6 (L) 04/22/2016 1355   CALCIUM 9.0 09/19/2014 1118   ALKPHOS 132 (H) 04/22/2016 1355   ALKPHOS 85 09/19/2014 1118   AST 13 (L) 04/22/2016 1355   AST 20 09/19/2014 1118   ALT 10 (L) 04/22/2016 1355   ALT 16 09/19/2014 1118   BILITOT 0.3 04/22/2016 1355   BILITOT 0.6 09/19/2014 1118      Results for Daniel, Natiya MORA (MRN [id number]) as of 04/25/2016 11:53  Ref. Range 02/23/2016 10:28 03/22/2016 10:55 04/04/2016 14:32 04/08/2016 16:20 04/22/2016 13:55  Kappa free light chain Latest Ref Range: 3.3 - 19.4 mg/L 20.5 (H) 27.0 (H)   19.2  Lamda free light chains Latest Ref Range: 5.7 - 26.3 mg/L 11.0 17.5   10.1  Kappa, lamda light chain ratio Latest Ref Range: 0.26 - 1.65  1.86 (H) 1.54   1.90 (H)  M Protein SerPl Elph-Mcnc Latest Ref Range: Not Observed g/dL 0.5 (H) 0.5 (H)   0.5 (H)   RADIOGRAPHIC STUDIES: I have personally reviewed the radiological images as listed and agreed with the findings in the report. No results found.   ASSESSMENT & PLAN:  Multiple myeloma not having achieved remission ([facility]) Multiple myeloma relapsed currently on  pomolyst 4 mg 2 weeks on 2 week off- patient seems to be tolerating well without any major side effects- however noted to have worsening cognitive decline.   #  Most recent myeloma numbers in OCT  2017 M protein 0.5 g [stable]; kappa lambda light chain ratio= slightly abnormal. .  Normal CBC CMP.  July 2017 skeletal survey stable bone lesions. Restart pomolyst 2 weeks on and 2 weeks off next week.  # Pain control/pelvic fracture-conservative management; recommend increasing the fentanyl patch 25 and also using oxycodone 5 mg; one to half a pill every 4-6 hours. Discussed regarding MiraLAX Colace for constipation.  # Discontinue dexamethasone; given her history of confusion.  # confusion/delirium/ slow cognitive decline- discussed at length with the patient and family. Patient is frustrated that her family is taking over for her financial independence. Patient will need 24-hour assistance at home-given high risk for falls/self-harm. I long discussion the patient regarding getting a family help her.   # History of osteonecrosis of the jaw- not on Zometa.  # Follow-up in 4 weeks with labs/ myeloma labs.   # 40 minutes face-to-face with the patient discussing the above plan of care; more than 50% of time spent on prognosis/ natural history; counseling and coordination.  Orders Placed This Encounter  Procedures  . Multiple Myeloma Panel (SPEP&IFE w/QIG)    Standing Status:   Future    Standing Expiration Date:   04/25/2017  . Kappa/lambda light chains    Standing Status:   Future    Standing Expiration Date:   04/25/2017  . CBC with Differential    Standing Status:   Future    Standing Expiration Date:   04/25/2017  . Comprehensive metabolic panel    Standing Status:   Future    Standing Expiration Date:   04/25/2017   All questions were answered. The patient knows to call the clinic with any problems, questions or concerns.      Cammie Sickle, MD 04/25/2016 12:25 PM

## 2016-04-25 NOTE — Progress Notes (Signed)
Patient is here for follow up, she is doing ok. The caretaker/friend makes up patient medication. They have a question about her Decadron. She is shipped three tabs but our instructions say take 5 tabs on days 1,8,15

## 2016-05-14 DIAGNOSIS — G8929 Other chronic pain: Secondary | ICD-10-CM | POA: Diagnosis not present

## 2016-05-14 DIAGNOSIS — R102 Pelvic and perineal pain: Secondary | ICD-10-CM | POA: Diagnosis not present

## 2016-05-14 DIAGNOSIS — M545 Low back pain: Secondary | ICD-10-CM | POA: Diagnosis not present

## 2016-05-15 ENCOUNTER — Encounter: Payer: Self-pay | Admitting: *Deleted

## 2016-05-15 NOTE — Progress Notes (Signed)
Patient's daughter contacted per v/o Dr. Rogue Bussing - daughter requested that Dr. Rogue Bussing to complete form for ICD dx and concurrent conditions for cognitive health. Family believes pt needs to be dx with dementia to determine if she is available for other financial resources and programs-available to assist her mother.  Dr. Rogue Bussing declines to complete this md statement- defers documents to pcp or neurologist.  Pt's daughter-[name] church prefers to have a note sent to Dr. Loney Hering to inform him of the need for this application. She will pick up the incomplete form tomorrow to take to pcp.  Will route my note to Dr. Loney Hering.

## 2016-05-16 DIAGNOSIS — L570 Actinic keratosis: Secondary | ICD-10-CM | POA: Diagnosis not present

## 2016-05-17 ENCOUNTER — Other Ambulatory Visit: Payer: Self-pay | Admitting: Internal Medicine

## 2016-05-17 DIAGNOSIS — C9002 Multiple myeloma in relapse: Secondary | ICD-10-CM

## 2016-05-20 ENCOUNTER — Other Ambulatory Visit: Payer: Self-pay | Admitting: Internal Medicine

## 2016-05-23 ENCOUNTER — Inpatient Hospital Stay (HOSPITAL_BASED_OUTPATIENT_CLINIC_OR_DEPARTMENT_OTHER): Payer: Medicare Other | Admitting: Internal Medicine

## 2016-05-23 ENCOUNTER — Inpatient Hospital Stay: Payer: Medicare Other | Attending: Internal Medicine

## 2016-05-23 VITALS — BP 119/64 | HR 80 | Temp 97.3°F | Wt 101.0 lb

## 2016-05-23 DIAGNOSIS — I4891 Unspecified atrial fibrillation: Secondary | ICD-10-CM | POA: Diagnosis not present

## 2016-05-23 DIAGNOSIS — M129 Arthropathy, unspecified: Secondary | ICD-10-CM | POA: Diagnosis not present

## 2016-05-23 DIAGNOSIS — Z8781 Personal history of (healed) traumatic fracture: Secondary | ICD-10-CM

## 2016-05-23 DIAGNOSIS — Z79899 Other long term (current) drug therapy: Secondary | ICD-10-CM | POA: Insufficient documentation

## 2016-05-23 DIAGNOSIS — F419 Anxiety disorder, unspecified: Secondary | ICD-10-CM | POA: Diagnosis not present

## 2016-05-23 DIAGNOSIS — I2510 Atherosclerotic heart disease of native coronary artery without angina pectoris: Secondary | ICD-10-CM | POA: Diagnosis not present

## 2016-05-23 DIAGNOSIS — C9000 Multiple myeloma not having achieved remission: Secondary | ICD-10-CM

## 2016-05-23 DIAGNOSIS — I10 Essential (primary) hypertension: Secondary | ICD-10-CM

## 2016-05-23 DIAGNOSIS — R410 Disorientation, unspecified: Secondary | ICD-10-CM | POA: Insufficient documentation

## 2016-05-23 DIAGNOSIS — C9002 Multiple myeloma in relapse: Secondary | ICD-10-CM

## 2016-05-23 DIAGNOSIS — Z923 Personal history of irradiation: Secondary | ICD-10-CM | POA: Insufficient documentation

## 2016-05-23 DIAGNOSIS — E785 Hyperlipidemia, unspecified: Secondary | ICD-10-CM | POA: Diagnosis not present

## 2016-05-23 DIAGNOSIS — F410 Panic disorder [episodic paroxysmal anxiety] without agoraphobia: Secondary | ICD-10-CM

## 2016-05-23 LAB — COMPREHENSIVE METABOLIC PANEL
ALBUMIN: 4.1 g/dL (ref 3.5–5.0)
ALT: 10 U/L — ABNORMAL LOW (ref 14–54)
AST: 18 U/L (ref 15–41)
Alkaline Phosphatase: 106 U/L (ref 38–126)
Anion gap: 8 (ref 5–15)
BUN: 19 mg/dL (ref 6–20)
CHLORIDE: 100 mmol/L — AB (ref 101–111)
CO2: 28 mmol/L (ref 22–32)
Calcium: 8.8 mg/dL — ABNORMAL LOW (ref 8.9–10.3)
Creatinine, Ser: 0.67 mg/dL (ref 0.44–1.00)
GFR calc Af Amer: >60 mL/min (ref >60)
GFR calc non Af Amer: >60 mL/min (ref >60)
GLUCOSE: 102 mg/dL — AB (ref 65–99)
POTASSIUM: 4 mmol/L (ref 3.5–5.1)
SODIUM: 136 mmol/L (ref 135–145)
Total Bilirubin: 0.6 mg/dL (ref 0.3–1.2)
Total Protein: 7.4 g/dL (ref 6.5–8.1)

## 2016-05-23 LAB — CBC WITH DIFFERENTIAL/PLATELET
BASOS ABS: 0 10*3/uL (ref 0–0.1)
BASOS PCT: 1 %
EOS ABS: 0.2 10*3/uL (ref 0–0.7)
EOS PCT: 7 %
HEMATOCRIT: 34.8 % — AB (ref 35.0–47.0)
Hemoglobin: 11.6 g/dL — ABNORMAL LOW (ref 12.0–16.0)
Lymphocytes Relative: 33 %
Lymphs Abs: 1 10*3/uL (ref 1.0–3.6)
MCH: 29.9 pg (ref 26.0–34.0)
MCHC: 33.5 g/dL (ref 32.0–36.0)
MCV: 89.4 fL (ref 80.0–100.0)
MONO ABS: 1.1 10*3/uL — AB (ref 0.2–0.9)
Monocytes Relative: 37 %
NEUTROS ABS: 0.6 10*3/uL — AB (ref 1.4–6.5)
Neutrophils Relative %: 22 %
PLATELETS: 319 10*3/uL (ref 150–440)
RBC: 3.89 MIL/uL (ref 3.80–5.20)
RDW: 15.7 % — AB (ref 11.5–14.5)
WBC: 3 10*3/uL — ABNORMAL LOW (ref 3.6–11.0)

## 2016-05-23 MED ORDER — FENTANYL 25 MCG/HR TD PT72: 25.0000 ug | MEDICATED_PATCH | TRANSDERMAL | 0 refills | Status: DC

## 2016-05-23 NOTE — Assessment & Plan Note (Signed)
Multiple myeloma relapsed currently on pomolyst 4 mg 2 weeks on 2 week off- patient seems to be tolerating well without any major side effects- however noted to have worsening cognitive decline.   #  Most recent myeloma numbers in OCT  2017 M protein 0.5 g [stable]; kappa lambda light chain ratio= slightly abnormal. .  Normal CBC CMP.  July 2017 skeletal survey stable bone lesions. Restart pomolyst 2 weeks on and 2 weeks off next week.  # Pain control/pelvic fracture-conservative management; stable on fentanyl patch 25 and also using oxycodone 5 mg; one to half a pill every 4-6 hours.   # Discontinue dexamethasone; given her history of confusion.  # confusion/delirium/ slow cognitive decline- discussed at length with the patient and family. Patient is frustrated that her family is taking over for her financial independence. Patient will need 24-hour assistance at home-given high risk for falls/self-harm. I long discussion the patient regarding getting a family help her.   # History of osteonecrosis of the jaw- not on Zometa.   Defer to neurology re: mood swings.    # Follow-up in 6 weeks with labs/ myeloma labs.

## 2016-05-23 NOTE — Progress Notes (Signed)
[name] OFFICE PROGRESS NOTE  Patient Care Team: Derinda Late, MD as PCP - General (Family Medicine) Forest Gleason, MD (Oncology) Seeplaputhur Robinette Haines, MD (General Surgery)  No matching staging information was found for the patient.   Oncology History   1. Stage III Multiple Myeloma a. 11/20/09: SPEP showed quantitative IgG 4567 mg/dL with M spike of 3.8 g/dL and IFE showing IgG monoclonal protein with light chain specificity.  [place] urine protein electrophoresis showed M spike 27% Bence Jones Protein.  a. 11/28/09: bone marrow biopsy: - PLASMA CELL DYSCRASIA CONSISTENT WITH MULTIPLE MYELOMA (55%PLASMA CELLS ON ASPIRATE, 75% BY CD138 IMMUNOHISTOCHEMISTRY,KAPPA RESTRICTED).TRILINEAGE HEMATOPOIESIS.46 XX,  Normal FISH panel. b. 11/29/09: bone survey: There are numerous, scattered 2 mm to 5 mm concentric  radiolucencies in the bony calvaria.   Two or three similar focal lucent areas in the right  femur.  There are observed changes consistent with disc disease at multiple levels of the cervical spine and multiple levels of the lumbar spine. c. 12/07/09: right humerus: Multiple tiny lucencies noted throughout the right humerus including humeral diaphysis. No fracture. d. 8/11- 4/12 Mephalan/prednisone/velcade X 6 cycles with decrease in Mspike to 1.0 gm/dl. e.osteonecrosis of jaw and diagnosed in December of 2013(Zometa was discontinued) 7.Patient was started on Velcade and Decadron   because of progressive multiple myeloma from January of 2013. 8. Started on Kyprolis 05/13 9.progressing disease on kyphorilis September of 2013 10 patient has been started on [name] daily for 21 days and one week off Decadron day1, 18 and 15   with 4 week cycle is finished palliative radiation therapy without much relief in pain(November, 2013) 11.Palliative radiation therapy to the right shoulder (December, 2014) 12 patient was on break from pomolidomide because of infection and it has  been presumed from December 05, 2014  # July 2017- skeletal survey- stable bone lesions.      Multiple myeloma not having achieved remission ([place])   11/20/2009 Initial Diagnosis    Multiple myeloma in relapse        INTERVAL HISTORY:  [name] 82 y.o.  female pleasant patient above history of Multiple myeloma currently on pomolyst is here for follow-up/Accompanied by a caregiver/daughter-in-law.  In the interim patient was diagnosed with right hip pelvic fracture after fall. She has been evaluated by orthopedics. Recommended meloxicam.Patient continues to complain of pain in her right hip.patient is currently on fentanyl 12 g; oxycodone 2.5 as every 6-8 hours.  Family is very concerned about patient being by herself. They give multiple  Examples re: Significant decline in cognitive function [multiple examples- forgetting/procrastinating pay her bills; falsely accusing neighbor of stealing her medications/money; also taking medications-pomalyst inappropriately; trying to try her cell phone in a microwave; putting her oxycodone pain medication in the coffee ]. Patient has been not following family recommendations of using a rolling walker; which puts her at high risk for significant falls.    Appetite is good. No nausea no vomiting. No skin rash. No diarrhea. No chest pain or shortness of breath or cough. Denies any tingling or numbness.  REVIEW OF SYSTEMS:  A complete 10 point review of system is done which is negative except mentioned above/history of present illness.   PAST MEDICAL HISTORY :  Past Medical History:  Diagnosis Date  . Anxiety   . Arthritis   . Atrial fibrillation ([place])   . Coronary arteriosclerosis   . History of blood transfusion 2014  . Hyperlipidemia   . Hypertension   . Multiple  myeloma ([place])   . Multiple myeloma in relapse ([place]) 10/29/2014  . Osteonecrosis of jaw ([place])    left    PAST SURGICAL HISTORY :   Past Surgical History:  Procedure  Laterality Date  . ABDOMINAL HYSTERECTOMY    . APPENDECTOMY    . PARTIAL COLECTOMY    . TOTAL KNEE REVISION Left     FAMILY HISTORY :   Family History  Problem Relation Age of Onset  . Cancer Father   . Heart attack Mother     SOCIAL HISTORY:   Social History  Substance Use Topics  . Smoking status: Never Smoker  . Smokeless tobacco: Never Used  . Alcohol use 1.2 oz/week    2 Glasses of wine per week    ALLERGIES:  is allergic to biaxin [clarithromycin]; demerol [meperidine]; hydrocodone; sulfa antibiotics; tramadol; and ultracet [tramadol-acetaminophen].  MEDICATIONS:  Current Outpatient Prescriptions  Medication Sig Dispense Refill  . Biotin 1 MG CAPS Take by mouth.    . dexamethasone (DECADRON) 4 MG tablet Five tabs day 1,8, and 15 15 tablet 1  . Docusate Sodium (COLACE PO) Take 2 tablets by mouth 2 (two) times daily.    . fentaNYL (DURAGESIC - DOSED MCG/HR) 25 MCG/HR patch Place 1 patch (25 mcg total) onto the skin every 3 (three) days. 10 patch 0  . Multiple Vitamins-Minerals (CENTRUM SILVER 50+WOMEN PO) Take by mouth.    . oxyCODONE (OXY IR/ROXICODONE) 5 MG immediate release tablet Take 0.5 tablets (2.5 mg total) by mouth every 8 (eight) hours as needed for severe pain. 30 tablet 0  . pomalidomide (POMALYST) 4 MG capsule Take 1 capsule (4 mg total) by mouth daily. Rems Josem Kaufmann [id number] 14 capsule 0  . prochlorperazine (COMPAZINE) 10 MG tablet Take 1 tablet (10 mg total) by mouth every 8 (eight) hours as needed for nausea or vomiting. 30 tablet 1   No current facility-administered medications for this visit.    Facility-Administered Medications Ordered in Other Visits  Medication Dose Route Frequency Provider Last Rate Last Dose  . sodium chloride 0.9 % injection 10 mL  10 mL Intravenous PRN Forest Gleason, MD   10 mL at 03/01/15 1305    PHYSICAL EXAMINATION: ECOG PERFORMANCE STATUS: 0 - Asymptomatic  BP 119/64 (BP Location: Left Arm, Patient Position: Sitting)   Pulse  80   Temp 97.3 F (36.3 C) (Tympanic)   Wt 101 lb (45.8 kg)   BMI 18.47 kg/m   Filed Weights   05/23/16 1125  Weight: 101 lb (45.8 kg)    GENERAL: frail-appearing elderly female patiett Alert, no distress and comfortable. Accompanied by her care giver/ daughter-in-law Patient is a wheelchair. EYES: no pallor or icterus OROPHARYNX: no thrush or ulceration; good dentition  NECK: supple, no masses felt LYMPH:  no palpable lymphadenopathy in the cervical, axillary or inguinal regions LUNGS: clear to auscultation and  No wheeze or crackles HEART/CVS: regular rate & rhythm and no murmurs; soft tissue swelling noted in the left lower extremity dorsal surface. No tenderness.  ABDOMEN:abdomen soft, non-tender and normal bowel sounds Musculoskeletal:no cyanosis of digits and no clubbing  PSYCH: alert & oriented x 3 with fluent speech NEURO: no focal motor/sensory deficits SKIN:  no rashes or significant lesions  LABORATORY DATA:  I have reviewed the data as listed    Component Value Date/Time   NA 136 05/23/2016 1030   NA 138 09/19/2014 1118   K 4.0 05/23/2016 1030   K 4.0 09/19/2014 1118   CL 100 (  L) 05/23/2016 1030   CL 103 09/19/2014 1118   CO2 28 05/23/2016 1030   CO2 29 09/19/2014 1118   GLUCOSE 102 (H) 05/23/2016 1030   GLUCOSE 91 09/19/2014 1118   BUN 19 05/23/2016 1030   BUN 15 09/19/2014 1118   CREATININE 0.67 05/23/2016 1030   CREATININE 0.69 09/19/2014 1118   CALCIUM 8.8 (L) 05/23/2016 1030   CALCIUM 9.0 09/19/2014 1118   PROT 7.4 05/23/2016 1030   PROT 6.8 09/19/2014 1118   ALBUMIN 4.1 05/23/2016 1030   ALBUMIN 3.9 09/19/2014 1118   AST 18 05/23/2016 1030   AST 20 09/19/2014 1118   ALT 10 (L) 05/23/2016 1030   ALT 16 09/19/2014 1118   ALKPHOS 106 05/23/2016 1030   ALKPHOS 85 09/19/2014 1118   BILITOT 0.6 05/23/2016 1030   BILITOT 0.6 09/19/2014 1118   GFRNONAA >60 05/23/2016 1030   GFRNONAA >60 09/19/2014 1118   GFRAA >60 05/23/2016 1030   GFRAA >60  09/19/2014 1118    No results found for: SPEP, UPEP  Lab Results  Component Value Date   WBC 3.0 (L) 05/23/2016   NEUTROABS 0.6 (L) 05/23/2016   HGB 11.6 (L) 05/23/2016   HCT 34.8 (L) 05/23/2016   MCV 89.4 05/23/2016   PLT 319 05/23/2016      Chemistry      Component Value Date/Time   NA 136 05/23/2016 1030   NA 138 09/19/2014 1118   K 4.0 05/23/2016 1030   K 4.0 09/19/2014 1118   CL 100 (L) 05/23/2016 1030   CL 103 09/19/2014 1118   CO2 28 05/23/2016 1030   CO2 29 09/19/2014 1118   BUN 19 05/23/2016 1030   BUN 15 09/19/2014 1118   CREATININE 0.67 05/23/2016 1030   CREATININE 0.69 09/19/2014 1118      Component Value Date/Time   CALCIUM 8.8 (L) 05/23/2016 1030   CALCIUM 9.0 09/19/2014 1118   ALKPHOS 106 05/23/2016 1030   ALKPHOS 85 09/19/2014 1118   AST 18 05/23/2016 1030   AST 20 09/19/2014 1118   ALT 10 (L) 05/23/2016 1030   ALT 16 09/19/2014 1118   BILITOT 0.6 05/23/2016 1030   BILITOT 0.6 09/19/2014 1118      Results for Mcgaugh, Etheline MORA (MRN [id number]) as of 04/25/2016 11:53  Ref. Range 02/23/2016 10:28 03/22/2016 10:55 04/04/2016 14:32 04/08/2016 16:20 04/22/2016 13:55  Kappa free light chain Latest Ref Range: 3.3 - 19.4 mg/L 20.5 (H) 27.0 (H)   19.2  Lamda free light chains Latest Ref Range: 5.7 - 26.3 mg/L 11.0 17.5   10.1  Kappa, lamda light chain ratio Latest Ref Range: 0.26 - 1.65  1.86 (H) 1.54   1.90 (H)  M Protein SerPl Elph-Mcnc Latest Ref Range: Not Observed g/dL 0.5 (H) 0.5 (H)   0.5 (H)   RADIOGRAPHIC STUDIES: I have personally reviewed the radiological images as listed and agreed with the findings in the report. No results found.   ASSESSMENT & PLAN:  Multiple myeloma not having achieved remission (HCC) Multiple myeloma relapsed currently on pomolyst 4 mg 2 weeks on 2 week off- patient seems to be tolerating well without any major side effects- however noted to have worsening cognitive decline.   #  Most recent myeloma numbers in OCT   2017 M protein 0.5 g [stable]; kappa lambda light chain ratio= slightly abnormal. .  Normal CBC CMP.  July 2017 skeletal survey stable bone lesions. Restart pomolyst 2 weeks on and 2 weeks off next week.  #  Pain control/pelvic fracture-conservative management; stable on fentanyl patch 25 and also using oxycodone 5 mg; one to half a pill every 4-6 hours.   # Discontinue dexamethasone; given her history of confusion.  # confusion/delirium/ slow cognitive decline- discussed at length with the patient and family. Patient is frustrated that her family is taking over for her financial independence. Patient will need 24-hour assistance at home-given high risk for falls/self-harm. I long discussion the patient regarding getting a family help her.   # History of osteonecrosis of the jaw- not on Zometa.   Defer to neurology re: mood swings.    # Follow-up in 6 weeks with labs/ myeloma labs.    Orders Placed This Encounter  Procedures  . CBC with Differential    Standing Status:   Future    Standing Expiration Date:   05/23/2017  . Comprehensive metabolic panel    Standing Status:   Future    Standing Expiration Date:   05/23/2017  . Multiple Myeloma Panel (SPEP&IFE w/QIG)    Standing Status:   Future    Standing Expiration Date:   05/23/2017  . Kappa/lambda light chains    Standing Status:   Future    Standing Expiration Date:   05/23/2017   All questions were answered. The patient knows to call the clinic with any problems, questions or concerns.      Cammie Sickle, MD 05/28/2016 4:07 PM

## 2016-05-23 NOTE — Progress Notes (Signed)
Patient here today for follow up on Multiple myeloma not having achieved remission.

## 2016-05-24 LAB — KAPPA/LAMBDA LIGHT CHAINS
KAPPA, LAMDA LIGHT CHAIN RATIO: 3.21 — AB (ref 0.26–1.65)
Kappa free light chain: 31.1 mg/L — ABNORMAL HIGH (ref 3.3–19.4)
Lambda free light chains: 9.7 mg/L (ref 5.7–26.3)

## 2016-05-28 LAB — MULTIPLE MYELOMA PANEL, SERUM
ALBUMIN/GLOB SERPL: 1.1 (ref 0.7–1.7)
Albumin SerPl Elph-Mcnc: 3.5 g/dL (ref 2.9–4.4)
Alpha 1: 0.3 g/dL (ref 0.0–0.4)
Alpha2 Glob SerPl Elph-Mcnc: 0.8 g/dL (ref 0.4–1.0)
B-Globulin SerPl Elph-Mcnc: 1 g/dL (ref 0.7–1.3)
GAMMA GLOB SERPL ELPH-MCNC: 1.2 g/dL (ref 0.4–1.8)
GLOBULIN, TOTAL: 3.3 g/dL (ref 2.2–3.9)
IGA: 59 mg/dL — AB (ref 64–422)
IgG (Immunoglobin G), Serum: 1122 mg/dL (ref 700–1600)
IgM, Serum: 70 mg/dL (ref 26–217)
M Protein SerPl Elph-Mcnc: 0.8 g/dL — ABNORMAL HIGH
Total Protein ELP: 6.8 g/dL (ref 6.0–8.5)

## 2016-06-05 DIAGNOSIS — S92592A Other fracture of left lesser toe(s), initial encounter for closed fracture: Secondary | ICD-10-CM | POA: Diagnosis not present

## 2016-06-05 DIAGNOSIS — J189 Pneumonia, unspecified organism: Secondary | ICD-10-CM | POA: Diagnosis not present

## 2016-06-12 ENCOUNTER — Emergency Department: Payer: Medicare Other

## 2016-06-12 ENCOUNTER — Emergency Department
Admission: EM | Admit: 2016-06-12 | Discharge: 2016-06-12 | Disposition: A | Discharge status: Home / Self Care | Payer: Medicare Other | Attending: Emergency Medicine | Admitting: Emergency Medicine

## 2016-06-12 ENCOUNTER — Encounter: Payer: Self-pay | Admitting: *Deleted

## 2016-06-12 DIAGNOSIS — I2510 Atherosclerotic heart disease of native coronary artery without angina pectoris: Secondary | ICD-10-CM | POA: Insufficient documentation

## 2016-06-12 DIAGNOSIS — Z79899 Other long term (current) drug therapy: Secondary | ICD-10-CM | POA: Diagnosis not present

## 2016-06-12 DIAGNOSIS — R112 Nausea with vomiting, unspecified: Secondary | ICD-10-CM | POA: Insufficient documentation

## 2016-06-12 DIAGNOSIS — I10 Essential (primary) hypertension: Secondary | ICD-10-CM | POA: Diagnosis not present

## 2016-06-12 DIAGNOSIS — R109 Unspecified abdominal pain: Secondary | ICD-10-CM | POA: Diagnosis not present

## 2016-06-12 DIAGNOSIS — R1110 Vomiting, unspecified: Secondary | ICD-10-CM

## 2016-06-12 LAB — CBC
HCT: 33.3 % — ABNORMAL LOW (ref 35.0–47.0)
Hemoglobin: 11.1 g/dL — ABNORMAL LOW (ref 12.0–16.0)
MCH: 29.2 pg (ref 26.0–34.0)
MCHC: 33.2 g/dL (ref 32.0–36.0)
MCV: 87.9 fL (ref 80.0–100.0)
PLATELETS: 298 10*3/uL (ref 150–440)
RBC: 3.79 MIL/uL — AB (ref 3.80–5.20)
RDW: 16.5 % — ABNORMAL HIGH (ref 11.5–14.5)
WBC: 2.7 10*3/uL — ABNORMAL LOW (ref 3.6–11.0)

## 2016-06-12 LAB — TROPONIN I: Troponin I: <0.03 ng/mL (ref <0.03)

## 2016-06-12 LAB — URINALYSIS, COMPLETE (UACMP) WITH MICROSCOPIC
Bacteria, UA: NONE SEEN
Bilirubin Urine: NEGATIVE
Glucose, UA: NEGATIVE mg/dL
Hgb urine dipstick: NEGATIVE
KETONES UR: 5 mg/dL — AB
Leukocytes, UA: NEGATIVE
Nitrite: NEGATIVE
PH: 7 (ref 5.0–8.0)
Protein, ur: NEGATIVE mg/dL
Specific Gravity, Urine: 1.019 (ref 1.005–1.030)

## 2016-06-12 LAB — COMPREHENSIVE METABOLIC PANEL
ALBUMIN: 2.9 g/dL — AB (ref 3.5–5.0)
ALT: 11 U/L — AB (ref 14–54)
AST: 15 U/L (ref 15–41)
Alkaline Phosphatase: 94 U/L (ref 38–126)
Anion gap: 7 (ref 5–15)
BUN: 24 mg/dL — AB (ref 6–20)
CHLORIDE: 105 mmol/L (ref 101–111)
CO2: 25 mmol/L (ref 22–32)
Calcium: 8.4 mg/dL — ABNORMAL LOW (ref 8.9–10.3)
Creatinine, Ser: 0.45 mg/dL (ref 0.44–1.00)
GFR calc Af Amer: >60 mL/min (ref >60)
GLUCOSE: 99 mg/dL (ref 65–99)
POTASSIUM: 3.4 mmol/L — AB (ref 3.5–5.1)
SODIUM: 137 mmol/L (ref 135–145)
Total Bilirubin: 0.4 mg/dL (ref 0.3–1.2)
Total Protein: 6.3 g/dL — ABNORMAL LOW (ref 6.5–8.1)

## 2016-06-12 LAB — LIPASE, BLOOD: LIPASE: 11 U/L (ref 11–51)

## 2016-06-12 MED ORDER — ONDANSETRON 4 MG PO TBDP
4.0000 mg | ORAL_TABLET | Freq: Once | ORAL | Status: AC | PRN
  Administered 2016-06-12: 4 mg via ORAL

## 2016-06-12 MED ORDER — ONDANSETRON HCL 4 MG PO TABS: 4.0000 mg | ORAL_TABLET | Freq: Three times a day (TID) | ORAL | 0 refills | Status: DC | PRN

## 2016-06-12 MED ORDER — SODIUM CHLORIDE 0.9 % IV BOLUS (SEPSIS)
1000.0000 mL | Freq: Once | INTRAVENOUS | Status: AC
  Administered 2016-06-12: 1000 mL via INTRAVENOUS

## 2016-06-12 MED ORDER — ONDANSETRON 4 MG PO TBDP
ORAL_TABLET | ORAL | Status: AC
  Administered 2016-06-12: 4 mg via ORAL
  Filled 2016-06-12: qty 1

## 2016-06-12 MED ORDER — ONDANSETRON HCL 4 MG/2ML IJ SOLN
4.0000 mg | Freq: Once | INTRAMUSCULAR | Status: AC
  Administered 2016-06-12: 4 mg via INTRAVENOUS
  Filled 2016-06-12: qty 2

## 2016-06-12 MED ORDER — DOXYCYCLINE HYCLATE 100 MG PO TABS
100.0000 mg | ORAL_TABLET | Freq: Once | ORAL | Status: AC
  Administered 2016-06-12: 100 mg via ORAL
  Filled 2016-06-12: qty 1

## 2016-06-12 NOTE — ED Provider Notes (Signed)
[facility] Emergency Department Provider Note  ____________________________________________  Time seen: Approximately 6:27 PM  I have reviewed the triage vital signs and the nursing notes.   HISTORY  Chief Complaint Nausea; Emesis; and Abdominal Pain   HPI [name] is a 83 y.o. female the history of CAD, atrial fibrillation, multiple myeloma on pomalyst resents for evaluation of nausea and vomiting. According to patient's daughter-in-law she has had multiple episodes of nonbloody nonbilious emesis, significant nausea, inability to tolerate by mouth for the last 24 hours. Patient was diagnosed with pneumonia and has been on doxycycline since December 27 however has been unable to tolerate her antibiotics for the last 3 doses and has vomited them all. Patient reports that her cough is better and she has no chest pain or shortness of breath, no fever or chills, patient denies any abdominal pain, dysuria, hematuria, has had normal bowel movements with the last one yesterday, no diarrhea or constipation. Patient has had multiple abdominal surgeries including appendectomy, oophorectomy, and hysterectomy. She denies abdominal distention and is passing flatus.  Past Medical History:  Diagnosis Date  . Anxiety   . Arthritis   . Atrial fibrillation ([place])   . Coronary arteriosclerosis   . History of blood transfusion 2014  . Hyperlipidemia   . Hypertension   . Multiple myeloma ([place])   . Multiple myeloma in relapse ([place]) 10/29/2014  . Osteonecrosis of jaw ([place])    left    Patient Active Problem List   Diagnosis Date Noted  . Pain in right hip 12/28/2015  . Pain in lower back 12/28/2015  . Rheumatoid arthritis ([place]) 03/29/2015  . Inflammatory polyarthropathy ([place]) 03/29/2015  . Multiple myeloma not having achieved remission ([place]) 10/29/2014  . Absolute anemia 10/24/2014  . CAD in native artery 10/24/2014  . Benign essential HTN 10/24/2014  . MI  (mitral incompetence) 05/25/2014  . AF (paroxysmal atrial fibrillation) ([place]) 05/25/2014  . Paroxysmal atrial fibrillation ([place]) 05/25/2014    Past Surgical History:  Procedure Laterality Date  . ABDOMINAL HYSTERECTOMY    . APPENDECTOMY    . PARTIAL COLECTOMY    . TOTAL KNEE REVISION Left     Prior to Admission medications   Medication Sig Start Date End Date Taking? Authorizing Provider  Biotin 1 MG CAPS Take by mouth.    Historical Provider, MD  dexamethasone (DECADRON) 4 MG tablet Five tabs day 1,8, and 15 05/20/16   Cammie Sickle, MD  Docusate Sodium (COLACE PO) Take 2 tablets by mouth 2 (two) times daily.    Historical Provider, MD  fentaNYL (DURAGESIC - DOSED MCG/HR) 25 MCG/HR patch Place 1 patch (25 mcg total) onto the skin every 3 (three) days. 05/23/16   Cammie Sickle, MD  Multiple Vitamins-Minerals (CENTRUM SILVER 50+WOMEN PO) Take by mouth.    Historical Provider, MD  ondansetron (ZOFRAN) 4 MG tablet Take 1 tablet (4 mg total) by mouth every 8 (eight) hours as needed for nausea or vomiting. 06/12/16   Rudene Re, MD  oxyCODONE (OXY IR/ROXICODONE) 5 MG immediate release tablet Take 0.5 tablets (2.5 mg total) by mouth every 8 (eight) hours as needed for severe pain. 04/24/16   Cammie Sickle, MD  pomalidomide (POMALYST) 4 MG capsule Take 1 capsule (4 mg total) by mouth daily. Rems Josem Kaufmann [id number] 05/21/16   Cammie Sickle, MD  prochlorperazine (COMPAZINE) 10 MG tablet Take 1 tablet (10 mg total) by mouth every 8 (eight) hours as needed for nausea or vomiting.  04/12/16   Cammie Sickle, MD    Allergies Biaxin [clarithromycin]; Demerol [meperidine]; Hydrocodone; Sulfa antibiotics; Tramadol; and Ultracet [tramadol-acetaminophen]  Family History  Problem Relation Age of Onset  . Cancer Father   . Heart attack Mother     Social History Social History  Substance Use Topics  . Smoking status: Never Smoker  . Smokeless tobacco: Never Used  .  Alcohol use 1.2 oz/week    2 Glasses of wine per week    Review of Systems  Constitutional: Negative for fever. Eyes: Negative for visual changes. ENT: Negative for sore throat. Neck: No neck pain  Cardiovascular: Negative for chest pain. Respiratory: Negative for shortness of breath. Gastrointestinal: Negative for abdominal pain or diarrhea. + N/V Genitourinary: Negative for dysuria. Musculoskeletal: Negative for back pain. Skin: Negative for rash. Neurological: Negative for headaches, weakness or numbness. Psych: No SI or HI  ____________________________________________   PHYSICAL EXAM:  VITAL SIGNS: ED Triage Vitals  Enc Vitals Group     BP 06/12/16 1514 (!) 142/64     Pulse Rate 06/12/16 1514 79     Resp 06/12/16 1514 18     Temp 06/12/16 1514 98.3 F (36.8 C)     Temp Source 06/12/16 1514 Oral     SpO2 06/12/16 1514 97 %     Weight 06/12/16 1515 101 lb (45.8 kg)     Height 06/12/16 1515 '5\' 3"'  (1.6 m)     Head Circumference --      Peak Flow --      Pain Score 06/12/16 1755 0     Pain Loc --      Pain Edu? --      Excl. in [place]? --     Constitutional: Alert and oriented. Well appearing and in no apparent distress. HEENT:      Head: Normocephalic and atraumatic.         Eyes: Conjunctivae are normal. Sclera is non-icteric. EOMI. PERRL      Mouth/Throat: Mucous membranes are moist.       Neck: Supple with no signs of meningismus. Cardiovascular: Regular rate and rhythm. No murmurs, gallops, or rubs. 2+ symmetrical distal pulses are present in all extremities. No JVD. Respiratory: Normal respiratory effort. Lungs are clear to auscultation bilaterally. No wheezes, crackles, or rhonchi.  Gastrointestinal: Soft, non tender, and non distended with positive bowel sounds. No rebound or guarding. Musculoskeletal: Nontender with normal range of motion in all extremities. No edema, cyanosis, or erythema of extremities. Neurologic: Normal speech and language. Face is  symmetric. Moving all extremities. No gross focal neurologic deficits are appreciated. Skin: Skin is warm, dry and intact. No rash noted. Psychiatric: Mood and affect are normal. Speech and behavior are normal.  ____________________________________________   LABS (all labs ordered are listed, but only abnormal results are displayed)  Labs Reviewed  COMPREHENSIVE METABOLIC PANEL - Abnormal; Notable for the following:       Result Value   Potassium 3.4 (*)    BUN 24 (*)    Calcium 8.4 (*)    Total Protein 6.3 (*)    Albumin 2.9 (*)    ALT 11 (*)    All other components within normal limits  CBC - Abnormal; Notable for the following:    WBC 2.7 (*)    RBC 3.79 (*)    Hemoglobin 11.1 (*)    HCT 33.3 (*)    RDW 16.5 (*)    All other components within normal limits  URINALYSIS, COMPLETE (  UACMP) WITH MICROSCOPIC - Abnormal; Notable for the following:    Color, Urine YELLOW (*)    APPearance CLEAR (*)    Ketones, ur 5 (*)    Squamous Epithelial / LPF 0-5 (*)    All other components within normal limits  LIPASE, BLOOD  TROPONIN I   ____________________________________________  EKG  ED ECG REPORT I, Rudene Re, the attending physician, personally viewed and interpreted this ECG.  Normal sinus rhythm, rate of 66, normal intervals, right axis deviation, no ST elevations or depressions. Unchange from prior from 2014 ____________________________________________  RADIOLOGY  CXR: Hyperinflation. No radiographic evidence for acute cardiopulmonary abnormality.  KUB:  Nonobstructed bowel-gas pattern. Partially visualized fracture of the right superior pubic ramus, pubic symphysis. New compared with 12/28/2015 skeletal survey. ____________________________________________   PROCEDURES  Procedure(s) performed: None Procedures Critical Care performed:  None ____________________________________________   INITIAL IMPRESSION / ASSESSMENT AND PLAN / ED COURSE  83 y.o.  female the history of CAD, atrial fibrillation, multiple myeloma on pomalyst resents for evaluation of nausea and vomiting and inability to tolerate by mouth for the last 24 hours. Patient has been unable to take her doxycycline for pneumonia for the last 3 doses. Patient is well-appearing in no distress, has normal vital signs, no signs of dehydration exam, her abdomen is soft, flat, nontender throughout. Her physical exam is with no acute findings. We'll get chest x-ray and troponin to rule out ACS as cause of her nausea and vomiting since patient has history of coronary artery disease. We'll check blood work to evaluate for electrolyte abnormalities or dehydration. We'll check chest x-ray and KUB. We'll check urinalysis for an infection. We'll give her IV fluids and IV Zofran.  Clinical Course    Patient tolerating PO. Continued to have no abdominal pain or tenderness. Labs WNL with no acute findings. UA pending. Care transferred to Dr. Craig Staggers at [facility] pending UA.   Pertinent labs & imaging results that were available during my care of the patient were reviewed by me and considered in my medical decision making (see chart for details).    ____________________________________________   FINAL CLINICAL IMPRESSION(S) / ED DIAGNOSES  Final diagnoses:  Vomiting  Non-intractable vomiting with nausea, unspecified vomiting type      NEW MEDICATIONS STARTED DURING THIS VISIT:  Discharge Medication List as of 06/12/2016  9:19 PM    START taking these medications   Details  ondansetron (ZOFRAN) 4 MG tablet Take 1 tablet (4 mg total) by mouth every 8 (eight) hours as needed for nausea or vomiting., Starting Wed 06/12/2016, Print         Note:  This document was prepared using Dragon voice recognition software and may include unintentional dictation errors.    Rudene Re, MD 06/16/16 [phone]

## 2016-06-12 NOTE — ED Triage Notes (Signed)
Pt complains of nausea, vomiting and abdominal pain for 24 hours, pt denies fever, pt had pneumonia on 12/27

## 2016-06-12 NOTE — Discharge Instructions (Signed)
Take Zofran as needed for nausea or vomiting. Continue your antibiotics for pneumonia. Follow-up with her doctor in 2 days. Return to the emergency room if you have fever, abdominal pain, if you're unable to stop the vomiting with Zofran, or if you develop any other symptoms concerning to you.

## 2016-06-18 DIAGNOSIS — C9002 Multiple myeloma in relapse: Secondary | ICD-10-CM | POA: Diagnosis not present

## 2016-06-18 DIAGNOSIS — F0390 Unspecified dementia without behavioral disturbance: Secondary | ICD-10-CM | POA: Diagnosis not present

## 2016-06-18 DIAGNOSIS — R634 Abnormal weight loss: Secondary | ICD-10-CM | POA: Diagnosis not present

## 2016-06-18 DIAGNOSIS — Z0000 Encounter for general adult medical examination without abnormal findings: Secondary | ICD-10-CM | POA: Diagnosis not present

## 2016-06-24 ENCOUNTER — Other Ambulatory Visit: Payer: Self-pay | Admitting: *Deleted

## 2016-06-24 DIAGNOSIS — C9002 Multiple myeloma in relapse: Secondary | ICD-10-CM

## 2016-06-24 MED ORDER — POMALIDOMIDE 4 MG PO CAPS: 4.0000 mg | ORAL_CAPSULE | Freq: Every day | ORAL | 0 refills | Status: DC

## 2016-06-24 NOTE — Progress Notes (Unsigned)
[name]:[id number] for pomalyst

## 2016-06-25 ENCOUNTER — Other Ambulatory Visit: Payer: Self-pay | Admitting: *Deleted

## 2016-06-25 DIAGNOSIS — C9000 Multiple myeloma not having achieved remission: Secondary | ICD-10-CM

## 2016-06-25 MED ORDER — FENTANYL 25 MCG/HR TD PT72: 25.0000 ug | MEDICATED_PATCH | TRANSDERMAL | 0 refills | Status: DC

## 2016-06-27 ENCOUNTER — Inpatient Hospital Stay: Payer: Medicare Other

## 2016-07-04 DIAGNOSIS — H35372 Puckering of macula, left eye: Secondary | ICD-10-CM | POA: Diagnosis not present

## 2016-07-05 ENCOUNTER — Inpatient Hospital Stay: Payer: Medicare Other | Attending: Internal Medicine | Admitting: Internal Medicine

## 2016-07-05 ENCOUNTER — Other Ambulatory Visit: Payer: Medicare Other

## 2016-07-05 ENCOUNTER — Inpatient Hospital Stay: Payer: Medicare Other

## 2016-07-05 DIAGNOSIS — I4891 Unspecified atrial fibrillation: Secondary | ICD-10-CM

## 2016-07-05 DIAGNOSIS — I2510 Atherosclerotic heart disease of native coronary artery without angina pectoris: Secondary | ICD-10-CM | POA: Insufficient documentation

## 2016-07-05 DIAGNOSIS — E785 Hyperlipidemia, unspecified: Secondary | ICD-10-CM | POA: Diagnosis not present

## 2016-07-05 DIAGNOSIS — Z8739 Personal history of other diseases of the musculoskeletal system and connective tissue: Secondary | ICD-10-CM | POA: Diagnosis not present

## 2016-07-05 DIAGNOSIS — Z923 Personal history of irradiation: Secondary | ICD-10-CM | POA: Diagnosis not present

## 2016-07-05 DIAGNOSIS — S72001D Fracture of unspecified part of neck of right femur, subsequent encounter for closed fracture with routine healing: Secondary | ICD-10-CM

## 2016-07-05 DIAGNOSIS — R4182 Altered mental status, unspecified: Secondary | ICD-10-CM

## 2016-07-05 DIAGNOSIS — W19XXXD Unspecified fall, subsequent encounter: Secondary | ICD-10-CM | POA: Insufficient documentation

## 2016-07-05 DIAGNOSIS — R410 Disorientation, unspecified: Secondary | ICD-10-CM | POA: Diagnosis not present

## 2016-07-05 DIAGNOSIS — Z809 Family history of malignant neoplasm, unspecified: Secondary | ICD-10-CM | POA: Diagnosis not present

## 2016-07-05 DIAGNOSIS — R634 Abnormal weight loss: Secondary | ICD-10-CM

## 2016-07-05 DIAGNOSIS — S92401D Displaced unspecified fracture of right great toe, subsequent encounter for fracture with routine healing: Secondary | ICD-10-CM | POA: Diagnosis not present

## 2016-07-05 DIAGNOSIS — R630 Anorexia: Secondary | ICD-10-CM | POA: Diagnosis not present

## 2016-07-05 DIAGNOSIS — C9000 Multiple myeloma not having achieved remission: Secondary | ICD-10-CM

## 2016-07-05 DIAGNOSIS — C9002 Multiple myeloma in relapse: Secondary | ICD-10-CM | POA: Insufficient documentation

## 2016-07-05 DIAGNOSIS — F419 Anxiety disorder, unspecified: Secondary | ICD-10-CM

## 2016-07-05 DIAGNOSIS — I10 Essential (primary) hypertension: Secondary | ICD-10-CM | POA: Diagnosis not present

## 2016-07-05 DIAGNOSIS — Z79899 Other long term (current) drug therapy: Secondary | ICD-10-CM | POA: Insufficient documentation

## 2016-07-05 LAB — CBC WITH DIFFERENTIAL/PLATELET
BASOS ABS: 0.1 10*3/uL (ref 0–0.1)
Basophils Relative: 2 %
EOS PCT: 1 %
Eosinophils Absolute: 0.1 10*3/uL (ref 0–0.7)
HCT: 34.9 % — ABNORMAL LOW (ref 35.0–47.0)
Hemoglobin: 11.3 g/dL — ABNORMAL LOW (ref 12.0–16.0)
LYMPHS PCT: 17 %
Lymphs Abs: 0.9 10*3/uL — ABNORMAL LOW (ref 1.0–3.6)
MCH: 28.8 pg (ref 26.0–34.0)
MCHC: 32.5 g/dL (ref 32.0–36.0)
MCV: 88.7 fL (ref 80.0–100.0)
Monocytes Absolute: 0.3 10*3/uL (ref 0.2–0.9)
Monocytes Relative: 5 %
Neutro Abs: 3.8 10*3/uL (ref 1.4–6.5)
Neutrophils Relative %: 75 %
PLATELETS: 291 10*3/uL (ref 150–440)
RBC: 3.93 MIL/uL (ref 3.80–5.20)
RDW: 17.4 % — ABNORMAL HIGH (ref 11.5–14.5)
WBC: 5.1 10*3/uL (ref 3.6–11.0)

## 2016-07-05 LAB — COMPREHENSIVE METABOLIC PANEL
ALT: 10 U/L — AB (ref 14–54)
AST: 16 U/L (ref 15–41)
Albumin: 3.5 g/dL (ref 3.5–5.0)
Alkaline Phosphatase: 76 U/L (ref 38–126)
Anion gap: 5 (ref 5–15)
BUN: 25 mg/dL — ABNORMAL HIGH (ref 6–20)
CHLORIDE: 102 mmol/L (ref 101–111)
CO2: 30 mmol/L (ref 22–32)
CREATININE: 0.72 mg/dL (ref 0.44–1.00)
Calcium: 9.2 mg/dL (ref 8.9–10.3)
GFR calc Af Amer: >60 mL/min (ref >60)
Glucose, Bld: 91 mg/dL (ref 65–99)
Potassium: 4.3 mmol/L (ref 3.5–5.1)
SODIUM: 137 mmol/L (ref 135–145)
Total Bilirubin: 0.3 mg/dL (ref 0.3–1.2)
Total Protein: 7.2 g/dL (ref 6.5–8.1)

## 2016-07-05 NOTE — Progress Notes (Signed)
[place] OFFICE PROGRESS NOTE  Patient Care Team: Derinda Late, MD as PCP - General (Family Medicine) Forest Gleason, MD (Oncology) Seeplaputhur Robinette Haines, MD (General Surgery)  No matching staging information was found for the patient.   Oncology History   1. Stage III Multiple Myeloma a. 11/20/09: SPEP showed quantitative IgG 4567 mg/dL with M spike of 3.8 g/dL and IFE showing IgG monoclonal protein with light chain specificity.  [place] urine protein electrophoresis showed M spike 27% Bence Jones Protein.  a. 11/28/09: bone marrow biopsy: - PLASMA CELL DYSCRASIA CONSISTENT WITH MULTIPLE MYELOMA (55%PLASMA CELLS ON ASPIRATE, 75% BY CD138 IMMUNOHISTOCHEMISTRY,KAPPA RESTRICTED).TRILINEAGE HEMATOPOIESIS.46 XX,  Normal FISH panel. b. 11/29/09: bone survey: There are numerous, scattered 2 mm to 5 mm concentric  radiolucencies in the bony calvaria.   Two or three similar focal lucent areas in the right  femur.  There are observed changes consistent with disc disease at multiple levels of the cervical spine and multiple levels of the lumbar spine. c. 12/07/09: right humerus: Multiple tiny lucencies noted throughout the right humerus including humeral diaphysis. No fracture. d. 8/11- 4/12 Mephalan/prednisone/velcade X 6 cycles with decrease in Mspike to 1.0 gm/dl. e.osteonecrosis of jaw and diagnosed in December of 2013(Zometa was discontinued) 7.Patient was started on Velcade and Decadron   because of progressive multiple myeloma from January of 2013. 8. Started on Kyprolis 05/13 9.progressing disease on kyphorilis September of 2013 10 patient has been started on [name] daily for 21 days and one week off Decadron day1, 18 and 15   with 4 week cycle is finished palliative radiation therapy without much relief in pain(November, 2013) 11.Palliative radiation therapy to the right shoulder (December, 2014) 12 patient was on break from pomolidomide because of infection and it has  been presumed from December 05, 2014  # July 2017- skeletal survey- stable bone lesions.      Multiple myeloma not having achieved remission ([place])   11/20/2009 Initial Diagnosis    Multiple myeloma in relapse        INTERVAL HISTORY:  [name] 83 y.o.  female pleasant patient above history of Multiple myeloma currently on pomolyst is here for follow-up/Accompanied by a caregiver/daughter-in-law.  Patient is recovering from right hip pelvic fracture after fall and right great toe fracture. She is being evaluated by orthopedics.Pain is controlled with both fentanyl 12 g; oxycodone 2.5 as every 6-8 hours.  Family is very concerned about the patients appetite and weight loss of 10 lbs since October. She is refusing to eat some days and will not eat or drink on the regular. Daughter in law had to bring patient to ED at the beginning of month due to refusal to eat. She was given two bags of fluids and sent home. Family states they struggle daily to get her to eat. Recently, her PCP placed her on Remeron for appeite which she takes nightly that she is unsure if helping at this time.  She is still having confusion and altered mental status daily. No nausea no vomiting. No skin rash. No diarrhea. No chest pain or shortness of breath or cough. Denies any tingling or numbness.  REVIEW OF SYSTEMS:  A complete 10 point review of system is done which is negative except mentioned above/history of present illness.   PAST MEDICAL HISTORY :  Past Medical History:  Diagnosis Date  . Anxiety   . Arthritis   . Atrial fibrillation ([place])   . Coronary arteriosclerosis   . History of  blood transfusion 2014  . Hyperlipidemia   . Hypertension   . Multiple myeloma ([place])   . Multiple myeloma in relapse ([place]) 10/29/2014  . Osteonecrosis of jaw ([place])    left    PAST SURGICAL HISTORY :   Past Surgical History:  Procedure Laterality Date  . ABDOMINAL HYSTERECTOMY    . APPENDECTOMY    . PARTIAL  COLECTOMY    . TOTAL KNEE REVISION Left     FAMILY HISTORY :   Family History  Problem Relation Age of Onset  . Cancer Father   . Heart attack Mother     SOCIAL HISTORY:   Social History  Substance Use Topics  . Smoking status: Never Smoker  . Smokeless tobacco: Never Used  . Alcohol use 1.2 oz/week    2 Glasses of wine per week    ALLERGIES:  is allergic to biaxin [clarithromycin]; demerol [meperidine]; hydrocodone; sulfa antibiotics; tramadol; and ultracet [tramadol-acetaminophen].  MEDICATIONS:  Current Outpatient Prescriptions  Medication Sig Dispense Refill  . Biotin 1 MG CAPS Take by mouth.    . Calcium-Magnesium-Vitamin D (CALCIUM 500 PO) Take by mouth.    Mariane Baumgarten Sodium (COLACE PO) Take 2 tablets by mouth 2 (two) times daily.    . fentaNYL (DURAGESIC - DOSED MCG/HR) 25 MCG/HR patch Place 1 patch (25 mcg total) onto the skin every 3 (three) days. 10 patch 0  . mirtazapine (REMERON) 7.5 MG tablet Take 7.5 mg by mouth at bedtime.    . Multiple Vitamins-Minerals (CENTRUM SILVER 50+WOMEN PO) Take by mouth.    . pomalidomide (POMALYST) 4 MG capsule Take 1 capsule (4 mg total) by mouth daily. Take for 2 weeks on, take 2 weeks off 14 capsule 0  . Probiotic Product (ALIGN PO) Take by mouth.    . dexamethasone (DECADRON) 4 MG tablet Five tabs day 1,8, and 15 (Patient not taking: Reported on 07/05/2016) 15 tablet 1  . ondansetron (ZOFRAN) 4 MG tablet Take 1 tablet (4 mg total) by mouth every 8 (eight) hours as needed for nausea or vomiting. (Patient not taking: Reported on 07/05/2016) 20 tablet 0  . oxyCODONE (OXY IR/ROXICODONE) 5 MG immediate release tablet Take 0.5 tablets (2.5 mg total) by mouth every 8 (eight) hours as needed for severe pain. (Patient not taking: Reported on 07/05/2016) 30 tablet 0  . prochlorperazine (COMPAZINE) 10 MG tablet Take 1 tablet (10 mg total) by mouth every 8 (eight) hours as needed for nausea or vomiting. (Patient not taking: Reported on 07/05/2016) 30  tablet 1   No current facility-administered medications for this visit.    Facility-Administered Medications Ordered in Other Visits  Medication Dose Route Frequency Provider Last Rate Last Dose  . sodium chloride 0.9 % injection 10 mL  10 mL Intravenous PRN Forest Gleason, MD   10 mL at 03/01/15 1305    PHYSICAL EXAMINATION: ECOG PERFORMANCE STATUS: 0 - Asymptomatic  BP 135/74 (BP Location: Left Arm, Patient Position: Sitting)   Pulse 73   Temp 97.5 F (36.4 C) (Tympanic)   Wt 90 lb 7 oz (41 kg)   BMI 16.02 kg/m   Filed Weights   07/05/16 1110  Weight: 90 lb 7 oz (41 kg)    GENERAL: frail-appearing elderly female patiett Alert, no distress and comfortable. Accompanied by her care giver/ daughter-in-law Patient is a wheelchair. EYES: no pallor or icterus OROPHARYNX: no thrush or ulceration; good dentition  NECK: supple, no masses felt LYMPH:  no palpable lymphadenopathy in the cervical, axillary or  inguinal regions LUNGS: clear to auscultation and  No wheeze or crackles HEART/CVS: regular rate & rhythm and no murmurs; soft tissue swelling noted in the left lower extremity dorsal surface. No tenderness.  ABDOMEN:abdomen soft, non-tender and normal bowel sounds Musculoskeletal:no cyanosis of digits and no clubbing  PSYCH: alert & oriented x 3 with fluent speech NEURO: no focal motor/sensory deficits SKIN:  no rashes or significant lesions  LABORATORY DATA:  I have reviewed the data as listed    Component Value Date/Time   NA 137 07/05/2016 1207   NA 138 09/19/2014 1118   K 4.3 07/05/2016 1207   K 4.0 09/19/2014 1118   CL 102 07/05/2016 1207   CL 103 09/19/2014 1118   CO2 30 07/05/2016 1207   CO2 29 09/19/2014 1118   GLUCOSE 91 07/05/2016 1207   GLUCOSE 91 09/19/2014 1118   BUN 25 (H) 07/05/2016 1207   BUN 15 09/19/2014 1118   CREATININE 0.72 07/05/2016 1207   CREATININE 0.69 09/19/2014 1118   CALCIUM 9.2 07/05/2016 1207   CALCIUM 9.0 09/19/2014 1118   PROT 7.2  07/05/2016 1207   PROT 6.8 09/19/2014 1118   ALBUMIN 3.5 07/05/2016 1207   ALBUMIN 3.9 09/19/2014 1118   AST 16 07/05/2016 1207   AST 20 09/19/2014 1118   ALT 10 (L) 07/05/2016 1207   ALT 16 09/19/2014 1118   ALKPHOS 76 07/05/2016 1207   ALKPHOS 85 09/19/2014 1118   BILITOT 0.3 07/05/2016 1207   BILITOT 0.6 09/19/2014 1118   GFRNONAA >60 07/05/2016 1207   GFRNONAA >60 09/19/2014 1118   GFRAA >60 07/05/2016 1207   GFRAA >60 09/19/2014 1118    No results found for: SPEP, UPEP  Lab Results  Component Value Date   WBC 5.1 07/05/2016   NEUTROABS 3.8 07/05/2016   HGB 11.3 (L) 07/05/2016   HCT 34.9 (L) 07/05/2016   MCV 88.7 07/05/2016   PLT 291 07/05/2016      Chemistry      Component Value Date/Time   NA 137 07/05/2016 1207   NA 138 09/19/2014 1118   K 4.3 07/05/2016 1207   K 4.0 09/19/2014 1118   CL 102 07/05/2016 1207   CL 103 09/19/2014 1118   CO2 30 07/05/2016 1207   CO2 29 09/19/2014 1118   BUN 25 (H) 07/05/2016 1207   BUN 15 09/19/2014 1118   CREATININE 0.72 07/05/2016 1207   CREATININE 0.69 09/19/2014 1118      Component Value Date/Time   CALCIUM 9.2 07/05/2016 1207   CALCIUM 9.0 09/19/2014 1118   ALKPHOS 76 07/05/2016 1207   ALKPHOS 85 09/19/2014 1118   AST 16 07/05/2016 1207   AST 20 09/19/2014 1118   ALT 10 (L) 07/05/2016 1207   ALT 16 09/19/2014 1118   BILITOT 0.3 07/05/2016 1207   BILITOT 0.6 09/19/2014 1118      Results for Audino, Wilda MORA (MRN [id number]) as of 04/25/2016 11:53  Ref. Range 02/23/2016 10:28 03/22/2016 10:55 04/04/2016 14:32 04/08/2016 16:20 04/22/2016 13:55  Kappa free light chain Latest Ref Range: 3.3 - 19.4 mg/L 20.5 (H) 27.0 (H)   19.2  Lamda free light chains Latest Ref Range: 5.7 - 26.3 mg/L 11.0 17.5   10.1  Kappa, lamda light chain ratio Latest Ref Range: 0.26 - 1.65  1.86 (H) 1.54   1.90 (H)  M Protein SerPl Elph-Mcnc Latest Ref Range: Not Observed g/dL 0.5 (H) 0.5 (H)   0.5 (H)   RADIOGRAPHIC STUDIES: I have  personally reviewed the  radiological images as listed and agreed with the findings in the report. No results found.   ASSESSMENT & PLAN:  Multiple myeloma not having achieved remission ([facility]) Multiple myeloma relapsed currently on pomolyst 4 mg 2 weeks on 2 week off- patient seems to be tolerating well without any major side effects- still noted to have worsening cognitive decline per daughter in law.   #  Most recent myeloma numbers in Dec  2017 M protein 0.8 g [slightly elevated]; kappa lambda light chain ratio= elevated since last visit up to 3.21. Normal CBC CMP.  July 2017 skeletal survey stable bone lesions. Labs were not drawn last week due to the weather. Will be drawn today. Continue pomolyst 2 weeks on and 2 weeks off for now until labs come back.  # Pain control/pelvic fracture-Better. Continue fentanyl patch 25 and also using oxycodone 5 mg; one to half a pill every 4-6 hours.   # confusion/delirium/ slow cognitive decline- discussed at length with the patient and family. Currently family is with patient at home 24 hours per day. Patient continues to express frustration about her lack of independence.   # History of osteonecrosis of the jaw- not on Zometa.  #Weight loss (10 lbs): Dietician referral ASAP with [facility]. Continue Remeron for now.   # Follow-up in 6 weeks with labs/ myeloma labs.    Orders Placed This Encounter  Procedures  . CBC with Differential    Standing Status:   Future    Standing Expiration Date:   07/05/2017  . Comprehensive metabolic panel    Standing Status:   Future    Standing Expiration Date:   07/05/2017  . Multiple Myeloma Panel (SPEP&IFE w/QIG)    Standing Status:   Future    Standing Expiration Date:   07/05/2017  . Kappa/lambda light chains    Standing Status:   Future    Standing Expiration Date:   07/05/2017   All questions were answered. The patient knows to call the clinic with any problems, questions or concerns.      Jacquelin Hawking,  NP 07/05/2016 3:51 PM

## 2016-07-05 NOTE — Assessment & Plan Note (Addendum)
Multiple myeloma relapsed currently on pomolyst 4 mg 2 weeks on 2 week off- patient seems to be tolerating well without any major side effects- still noted to have worsening cognitive decline per daughter in law.   #  Most recent myeloma numbers in Dec  2017 M protein 0.8 g [slightly elevated]; kappa lambda light chain ratio= elevated since last visit up to 3.21. Normal CBC CMP.  July 2017 skeletal survey stable bone lesions. Labs were not drawn last week due to the weather. Will be drawn today. Continue pomolyst 2 weeks on and 2 weeks off for now until labs come back.  # Pain control/pelvic fracture-Better. Continue fentanyl patch 25 and also using oxycodone 5 mg; one to half a pill every 4-6 hours.   # confusion/delirium/ slow cognitive decline- discussed at length with the patient and family. Currently family is with patient at home 24 hours per day. Patient continues to express frustration about her lack of independence.   # History of osteonecrosis of the jaw- not on Zometa.  #Weight loss (10 lbs): Dietician referral ASAP with [facility]. Continue Remeron for now.   # Follow-up in 6 weeks with labs/ myeloma labs.

## 2016-07-05 NOTE — Progress Notes (Signed)
Patient offers no complaints today.  Patient accompanied by her daughter today. 

## 2016-07-08 LAB — KAPPA/LAMBDA LIGHT CHAINS
KAPPA, LAMDA LIGHT CHAIN RATIO: 6.75 — AB (ref 0.26–1.65)
Kappa free light chain: 82.4 mg/L — ABNORMAL HIGH (ref 3.3–19.4)
LAMDA FREE LIGHT CHAINS: 12.2 mg/L (ref 5.7–26.3)

## 2016-07-09 LAB — MULTIPLE MYELOMA PANEL, SERUM
ALBUMIN/GLOB SERPL: 1 (ref 0.7–1.7)
ALPHA 1: 0.3 g/dL (ref 0.0–0.4)
ALPHA2 GLOB SERPL ELPH-MCNC: 0.7 g/dL (ref 0.4–1.0)
Albumin SerPl Elph-Mcnc: 3.2 g/dL (ref 2.9–4.4)
B-GLOBULIN SERPL ELPH-MCNC: 1.1 g/dL (ref 0.7–1.3)
Gamma Glob SerPl Elph-Mcnc: 1.3 g/dL (ref 0.4–1.8)
Globulin, Total: 3.4 g/dL (ref 2.2–3.9)
IGG (IMMUNOGLOBIN G), SERUM: 1210 mg/dL (ref 700–1600)
IGM, SERUM: 63 mg/dL (ref 26–217)
IgA: 82 mg/dL (ref 64–422)
M PROTEIN SERPL ELPH-MCNC: 1.1 g/dL — AB
Total Protein ELP: 6.6 g/dL (ref 6.0–8.5)

## 2016-07-10 NOTE — Progress Notes (Signed)
Labs are ordered and patient called. Thanks

## 2016-07-11 ENCOUNTER — Inpatient Hospital Stay: Payer: Medicare Other | Attending: Internal Medicine

## 2016-07-11 DIAGNOSIS — Z79899 Other long term (current) drug therapy: Secondary | ICD-10-CM | POA: Insufficient documentation

## 2016-07-11 DIAGNOSIS — Z923 Personal history of irradiation: Secondary | ICD-10-CM | POA: Insufficient documentation

## 2016-07-11 DIAGNOSIS — I10 Essential (primary) hypertension: Secondary | ICD-10-CM | POA: Insufficient documentation

## 2016-07-11 DIAGNOSIS — Z809 Family history of malignant neoplasm, unspecified: Secondary | ICD-10-CM | POA: Insufficient documentation

## 2016-07-11 DIAGNOSIS — E785 Hyperlipidemia, unspecified: Secondary | ICD-10-CM | POA: Insufficient documentation

## 2016-07-11 DIAGNOSIS — I2510 Atherosclerotic heart disease of native coronary artery without angina pectoris: Secondary | ICD-10-CM | POA: Insufficient documentation

## 2016-07-11 DIAGNOSIS — I4891 Unspecified atrial fibrillation: Secondary | ICD-10-CM | POA: Insufficient documentation

## 2016-07-11 DIAGNOSIS — Z888 Allergy status to other drugs, medicaments and biological substances status: Secondary | ICD-10-CM | POA: Insufficient documentation

## 2016-07-11 DIAGNOSIS — R410 Disorientation, unspecified: Secondary | ICD-10-CM | POA: Insufficient documentation

## 2016-07-11 DIAGNOSIS — R52 Pain, unspecified: Secondary | ICD-10-CM | POA: Insufficient documentation

## 2016-07-11 DIAGNOSIS — C9002 Multiple myeloma in relapse: Secondary | ICD-10-CM | POA: Insufficient documentation

## 2016-07-11 DIAGNOSIS — Z8739 Personal history of other diseases of the musculoskeletal system and connective tissue: Secondary | ICD-10-CM | POA: Insufficient documentation

## 2016-07-11 DIAGNOSIS — R54 Age-related physical debility: Secondary | ICD-10-CM | POA: Insufficient documentation

## 2016-07-11 NOTE — Progress Notes (Signed)
Nutrition Assessment   Reason for Assessment:   Family concerned about weight loss and poor appetite  ASSESSMENT:  83 year old female with multiple myeloma and patient of Dr. Rogue Bussing.  Past medical history of osteonecrosis of the jaw, afib, coronary ateriosclerosis, HLD, HTN.  Patient accompanied by son's girlfriend today at clinic visit.  Son's girlfriend reports that appetite has been decreased for " awhile".  Recently went to ED for IV fluids due to dehydration.  Typical day includes coffee never been a big breakfast eater. A few hours after she gets up will have bowel of cereal with whole milk, for lunch yesterday had small bowl of vegetable soup, in afternoon ate peaches and cottage cheese and then for dinner had another bowl of soup.  Patient does not like ensure/boost drinks.  Has tried mixing them with ice cream, fruit, etc to change flavor and does not like that either.    Patient reports no nausea, regular bowel movement with the help of colace and sometimes miralax.  Patient reports no trouble chewing or swallowing.    Patient reports no appetite for food.  Note per MD note family concerned about cognitive decline and has 24 hour caregiver at home.  Nutrition Focused Physical Exam: Nutrition-Focused physical exam completed. Findings are moderate to severe fat depletion, moderate to severe muscle depletion, and no edema.   Uses rolling walker to move around.  Medications: colace, MVI, probiotic, zofran and compazine, remeron   Labs: reviewed  Anthropometrics:  Height: 63 inches Weight: 90 lb BMI: 16  Patient weight in October 2017 was 108 pounds.  17% weight loss in the last 3 months, significant.    Estimated Energy Needs  Kcals: 1230-1400 calories/d Protein: 49-62 g/d Fluid: 1.4 L/d  NUTRITION DIAGNOSIS:  Unintentional weight loss related to poor appetite questions cognitive decline as evidenced by significant weight loss of 17% in the last 3 months and eating  eating < or equal to 75% of estimated energy needs for > or equal to 1 month   MALNUTRITION DIAGNOSIS: Patient meets criteria for severe malnutrition in chronic illness as evidenced by 17% weight loss in 3 months, eating < or equal to 75% of estimated energy needs for > or equal to 1 month, and severe muscle mass loss and severe fat depletion   INTERVENTION:   Discussed ways to increase calories and protein.  Handout provided.   Encouraged small frequent meals of high calories and protein foods. Provide receipes for alternative shakes from ensure/boost as patient does not like those supplements.  Gave samples of boost breeze and ensure clear for patient to try.      MONITORING, EVALUATION, GOAL: patient will consume adequate calories and protein to prevent further weight loss   NEXT VISIT: March 3rd at [facility]. Zenia Resides, [place], [place] (pager)

## 2016-07-12 ENCOUNTER — Telehealth: Payer: Self-pay | Admitting: Oncology

## 2016-07-18 ENCOUNTER — Telehealth: Payer: Self-pay

## 2016-07-18 ENCOUNTER — Other Ambulatory Visit: Payer: Self-pay | Admitting: Internal Medicine

## 2016-07-18 DIAGNOSIS — C9002 Multiple myeloma in relapse: Secondary | ICD-10-CM

## 2016-07-18 NOTE — Telephone Encounter (Signed)
Called and spoke with daughter in law, crystal, advised her of Dr Agnes Lawrence concerns of an acute issue and advised her to have the patient evaluated at the ED.  [name] has agreed and will take her to the ED for evaluation.

## 2016-07-18 NOTE — Telephone Encounter (Signed)
Nutrition Follow-up:  Received message to call [place] patient's son's girlfriend and caregiver.  [name] reports acutely over the past 3 days appetite has decreased. Have offered small frequent meals, provided encouragement to patient, tried supplements as well.   Patient continues to take remeron at bedtime.  No further recommendations regarding strategies for increasing calories and protein at this time.  Encouraged [name] to continue current plan.  Question of remeron can be adjusted. Discussed with Dr. Rogue Bussing [name]'s concern and Dr. Rogue Bussing will have CMA call to discuss change in patient status over the last 3 days, may have acute issues going on.  Discussed with Almyra Free, CMA.  Arias Weinert B. Zenia Resides, [place], [place] (pager)

## 2016-07-22 ENCOUNTER — Other Ambulatory Visit: Payer: Self-pay | Admitting: *Deleted

## 2016-07-22 DIAGNOSIS — C9000 Multiple myeloma not having achieved remission: Secondary | ICD-10-CM

## 2016-07-22 MED ORDER — FENTANYL 25 MCG/HR TD PT72: 25.0000 ug | MEDICATED_PATCH | TRANSDERMAL | 0 refills | Status: DC

## 2016-07-23 NOTE — Telephone Encounter (Signed)
Rems survey number [id number]. rx routed back to [facility]

## 2016-08-02 ENCOUNTER — Ambulatory Visit: Payer: Medicare Other | Admitting: Internal Medicine

## 2016-08-02 ENCOUNTER — Other Ambulatory Visit: Payer: Medicare Other

## 2016-08-06 ENCOUNTER — Telehealth: Payer: Self-pay

## 2016-08-06 NOTE — Telephone Encounter (Signed)
Nutrition:  Message received to call [employer], patient caregiver.  Spoke with [name] and would like to change nutrition appointment to later in the day on 3/1.  Appointment changed.  Sabiha Sura B. Zenia Resides, [place], [place] Registered Dietitian [phone] (pager)

## 2016-08-07 ENCOUNTER — Inpatient Hospital Stay: Payer: Medicare Other

## 2016-08-07 ENCOUNTER — Inpatient Hospital Stay (HOSPITAL_BASED_OUTPATIENT_CLINIC_OR_DEPARTMENT_OTHER): Payer: Medicare Other | Admitting: Internal Medicine

## 2016-08-07 VITALS — BP 121/71 | HR 82 | Temp 97.8°F | Resp 18 | Ht 63.0 in | Wt 102.0 lb

## 2016-08-07 DIAGNOSIS — I2510 Atherosclerotic heart disease of native coronary artery without angina pectoris: Secondary | ICD-10-CM

## 2016-08-07 DIAGNOSIS — R410 Disorientation, unspecified: Secondary | ICD-10-CM

## 2016-08-07 DIAGNOSIS — C9002 Multiple myeloma in relapse: Secondary | ICD-10-CM

## 2016-08-07 DIAGNOSIS — Z79899 Other long term (current) drug therapy: Secondary | ICD-10-CM | POA: Diagnosis not present

## 2016-08-07 DIAGNOSIS — C9000 Multiple myeloma not having achieved remission: Secondary | ICD-10-CM

## 2016-08-07 DIAGNOSIS — R54 Age-related physical debility: Secondary | ICD-10-CM | POA: Diagnosis not present

## 2016-08-07 DIAGNOSIS — Z809 Family history of malignant neoplasm, unspecified: Secondary | ICD-10-CM

## 2016-08-07 DIAGNOSIS — Z888 Allergy status to other drugs, medicaments and biological substances status: Secondary | ICD-10-CM

## 2016-08-07 DIAGNOSIS — R52 Pain, unspecified: Secondary | ICD-10-CM

## 2016-08-07 DIAGNOSIS — Z923 Personal history of irradiation: Secondary | ICD-10-CM

## 2016-08-07 DIAGNOSIS — E785 Hyperlipidemia, unspecified: Secondary | ICD-10-CM

## 2016-08-07 DIAGNOSIS — Z8739 Personal history of other diseases of the musculoskeletal system and connective tissue: Secondary | ICD-10-CM

## 2016-08-07 DIAGNOSIS — I10 Essential (primary) hypertension: Secondary | ICD-10-CM | POA: Diagnosis not present

## 2016-08-07 DIAGNOSIS — I4891 Unspecified atrial fibrillation: Secondary | ICD-10-CM

## 2016-08-07 LAB — CBC WITH DIFFERENTIAL/PLATELET
BASOS PCT: 1 %
Basophils Absolute: 0 10*3/uL (ref 0–0.1)
Eosinophils Absolute: 0.1 10*3/uL (ref 0–0.7)
Eosinophils Relative: 1 %
HEMATOCRIT: 34.1 % — AB (ref 35.0–47.0)
HEMOGLOBIN: 11.5 g/dL — AB (ref 12.0–16.0)
LYMPHS ABS: 0.8 10*3/uL — AB (ref 1.0–3.6)
LYMPHS PCT: 14 %
MCH: 29.4 pg (ref 26.0–34.0)
MCHC: 33.7 g/dL (ref 32.0–36.0)
MCV: 87.4 fL (ref 80.0–100.0)
MONO ABS: 0.3 10*3/uL (ref 0.2–0.9)
MONOS PCT: 6 %
NEUTROS ABS: 4.5 10*3/uL (ref 1.4–6.5)
NEUTROS PCT: 78 %
Platelets: 222 10*3/uL (ref 150–440)
RBC: 3.9 MIL/uL (ref 3.80–5.20)
RDW: 17.3 % — AB (ref 11.5–14.5)
WBC: 5.8 10*3/uL (ref 3.6–11.0)

## 2016-08-07 LAB — COMPREHENSIVE METABOLIC PANEL
ALBUMIN: 3.5 g/dL (ref 3.5–5.0)
ALK PHOS: 116 U/L (ref 38–126)
ALT: 14 U/L (ref 14–54)
ANION GAP: 6 (ref 5–15)
AST: 21 U/L (ref 15–41)
BILIRUBIN TOTAL: 0.6 mg/dL (ref 0.3–1.2)
BUN: 17 mg/dL (ref 6–20)
CALCIUM: 8.6 mg/dL — AB (ref 8.9–10.3)
CO2: 26 mmol/L (ref 22–32)
Chloride: 101 mmol/L (ref 101–111)
Creatinine, Ser: 0.64 mg/dL (ref 0.44–1.00)
GLUCOSE: 100 mg/dL — AB (ref 65–99)
POTASSIUM: 4.2 mmol/L (ref 3.5–5.1)
Sodium: 133 mmol/L — ABNORMAL LOW (ref 135–145)
TOTAL PROTEIN: 7.2 g/dL (ref 6.5–8.1)

## 2016-08-07 NOTE — Progress Notes (Signed)
Patient here for follow-up for multiple myleoma. Patient has gained weight since last visit. Improved appetite.  She declines the need for any RFs on narcotics.

## 2016-08-07 NOTE — Progress Notes (Signed)
[facility] OFFICE PROGRESS NOTE  Patient Care Team: Derinda Late, MD as PCP - General (Family Medicine) Forest Gleason, MD (Oncology) Seeplaputhur Robinette Haines, MD (General Surgery)  No matching staging information was found for the patient.   Oncology History   1. Stage III Multiple Myeloma a. 11/20/09: SPEP showed quantitative IgG 4567 mg/dL with M spike of 3.8 g/dL and IFE showing IgG monoclonal protein with light chain specificity.  [place] urine protein electrophoresis showed M spike 27% Bence Jones Protein.  a. 11/28/09: bone marrow biopsy: - PLASMA CELL DYSCRASIA CONSISTENT WITH MULTIPLE MYELOMA (55%PLASMA CELLS ON ASPIRATE, 75% BY CD138 IMMUNOHISTOCHEMISTRY,KAPPA RESTRICTED).TRILINEAGE HEMATOPOIESIS.46 XX,  Normal FISH panel. b. 11/29/09: bone survey: There are numerous, scattered 2 mm to 5 mm concentric  radiolucencies in the bony calvaria.   Two or three similar focal lucent areas in the right  femur.  There are observed changes consistent with disc disease at multiple levels of the cervical spine and multiple levels of the lumbar spine. c. 12/07/09: right humerus: Multiple tiny lucencies noted throughout the right humerus including humeral diaphysis. No fracture. d. 8/11- 4/12 Mephalan/prednisone/velcade X 6 cycles with decrease in Mspike to 1.0 gm/dl. e.osteonecrosis of jaw and diagnosed in December of 2013(Zometa was discontinued) 7.Patient was started on Velcade and Decadron   because of progressive multiple myeloma from January of 2013. 8. Started on Kyprolis 05/13 9.progressing disease on kyphorilis September of 2013 10 patient has been started on [name] daily for 21 days and one week off Decadron day1, 18 and 15   with 4 week cycle is finished palliative radiation therapy without much relief in pain(November, 2013) 11.Palliative radiation therapy to the right shoulder (December, 2014) 12 patient was on break from pomolidomide because of infection and it has  been presumed from December 05, 2014  # July 2017- skeletal survey- stable bone lesions.      Multiple myeloma not having achieved remission ([name])   11/20/2009 Initial Diagnosis    Multiple myeloma in relapse        INTERVAL HISTORY: Patient is a fair historian at best.  [name] 83 y.o.  female pleasant patient above history of Multiple myeloma currently on pomolyst is here for follow-up/Accompanied by a caregiver/daughter-in-law.  As per the family patient has been eating better. She is keen weight. Her pain is better controlled. She continues. On the fentanyl patch; taking oxycodone only as needed.  As per the family patient still continues to have episodes of confusion-not any worse. Patient denies any falls. No nausea no vomiting. No skin rash. No diarrhea. No chest pain or shortness of breath or cough. Denies any tingling or numbness.  REVIEW OF SYSTEMS:  A complete 10 point review of system is done which is negative except mentioned above/history of present illness.   PAST MEDICAL HISTORY :  Past Medical History:  Diagnosis Date  . Anxiety   . Arthritis   . Atrial fibrillation ([place])   . Coronary arteriosclerosis   . History of blood transfusion 2014  . Hyperlipidemia   . Hypertension   . Multiple myeloma ([place])   . Multiple myeloma in relapse ([place]) 10/29/2014  . Osteonecrosis of jaw ([place])    left    PAST SURGICAL HISTORY :   Past Surgical History:  Procedure Laterality Date  . ABDOMINAL HYSTERECTOMY    . APPENDECTOMY    . PARTIAL COLECTOMY    . TOTAL KNEE REVISION Left     FAMILY HISTORY :   Family  History  Problem Relation Age of Onset  . Cancer Father   . Heart attack Mother     SOCIAL HISTORY:   Social History  Substance Use Topics  . Smoking status: Never Smoker  . Smokeless tobacco: Never Used  . Alcohol use 1.2 oz/week    2 Glasses of wine per week    ALLERGIES:  is allergic to biaxin [clarithromycin]; demerol [meperidine]; hydrocodone;  sulfa antibiotics; tramadol; and ultracet [tramadol-acetaminophen].  MEDICATIONS:  Current Outpatient Prescriptions  Medication Sig Dispense Refill  . Biotin 1 MG CAPS Take by mouth.    . Calcium-Magnesium-Vitamin D (CALCIUM 500 PO) Take by mouth.    [name] Sodium (COLACE PO) Take 2 tablets by mouth 2 (two) times daily.    . fentaNYL (DURAGESIC - DOSED MCG/HR) 25 MCG/HR patch Place 1 patch (25 mcg total) onto the skin every 3 (three) days. 10 patch 0  . mirtazapine (REMERON) 7.5 MG tablet Take 15 mg by mouth at bedtime.     . Multiple Vitamins-Minerals (CENTRUM SILVER 50+WOMEN PO) Take by mouth.    . oxyCODONE (OXY IR/ROXICODONE) 5 MG immediate release tablet Take 0.5 tablets (2.5 mg total) by mouth every 8 (eight) hours as needed for severe pain. 30 tablet 0  . polyethylene glycol (MIRALAX / GLYCOLAX) packet Take 17 g by mouth daily as needed for mild constipation.    . pomalidomide (POMALYST) 4 MG capsule Take 1 capsule (4 mg total) by mouth daily. 14 capsule 0  . Probiotic Product (ALIGN PO) Take by mouth.    . ondansetron (ZOFRAN) 4 MG tablet Take 1 tablet (4 mg total) by mouth every 8 (eight) hours as needed for nausea or vomiting. (Patient not taking: Reported on 07/05/2016) 20 tablet 0  . prochlorperazine (COMPAZINE) 10 MG tablet Take 1 tablet (10 mg total) by mouth every 8 (eight) hours as needed for nausea or vomiting. (Patient not taking: Reported on 07/05/2016) 30 tablet 1   No current facility-administered medications for this visit.    Facility-Administered Medications Ordered in Other Visits  Medication Dose Route Frequency Provider Last Rate Last Dose  . sodium chloride 0.9 % injection 10 mL  10 mL Intravenous PRN Forest Gleason, MD   10 mL at 03/01/15 1305    PHYSICAL EXAMINATION: ECOG PERFORMANCE STATUS: 0 - Asymptomatic  BP 121/71 (Patient Position: Sitting)   Pulse 82   Temp 97.8 F (36.6 C) (Tympanic)   Resp 18   Ht _0  (1.6 m)   Wt 102 lb (46.3 kg)   BMI  18.07 kg/m   Filed Weights   08/07/16 1030  Weight: 102 lb (46.3 kg)    GENERAL: frail-appearing elderly female patiett Alert, no distress and comfortable. Accompanied by her care giver/ daughter-in-law. Patient is walking. EYES: no pallor or icterus OROPHARYNX: no thrush or ulceration; good dentition  NECK: supple, no masses felt LYMPH:  no palpable lymphadenopathy in the cervical, axillary or inguinal regions LUNGS: clear to auscultation and  No wheeze or crackles HEART/CVS: regular rate & rhythm and no murmurs; no swelling of the legs. No tenderness.  ABDOMEN:abdomen soft, non-tender and normal bowel sounds Musculoskeletal:no cyanosis of digits and no clubbing  PSYCH: alert & oriented x 3 with fluent speech NEURO: no focal motor/sensory deficits SKIN:  no rashes or significant lesions  LABORATORY DATA:  I have reviewed the data as listed    Component Value Date/Time   NA 133 (L) 08/07/2016 0938   NA 138 09/19/2014 1118   K  4.2 08/07/2016 0938   K 4.0 09/19/2014 1118   CL 101 08/07/2016 0938   CL 103 09/19/2014 1118   CO2 26 08/07/2016 0938   CO2 29 09/19/2014 1118   GLUCOSE 100 (H) 08/07/2016 0938   GLUCOSE 91 09/19/2014 1118   BUN 17 08/07/2016 0938   BUN 15 09/19/2014 1118   CREATININE 0.64 08/07/2016 0938   CREATININE 0.69 09/19/2014 1118   CALCIUM 8.6 (L) 08/07/2016 0938   CALCIUM 9.0 09/19/2014 1118   PROT 7.2 08/07/2016 0938   PROT 6.8 09/19/2014 1118   ALBUMIN 3.5 08/07/2016 0938   ALBUMIN 3.9 09/19/2014 1118   AST 21 08/07/2016 0938   AST 20 09/19/2014 1118   ALT 14 08/07/2016 0938   ALT 16 09/19/2014 1118   ALKPHOS 116 08/07/2016 0938   ALKPHOS 85 09/19/2014 1118   BILITOT 0.6 08/07/2016 0938   BILITOT 0.6 09/19/2014 1118   GFRNONAA >60 08/07/2016 0938   GFRNONAA >60 09/19/2014 1118   GFRAA >60 08/07/2016 0938   GFRAA >60 09/19/2014 1118    No results found for: SPEP, UPEP  Lab Results  Component Value Date   WBC 5.8 08/07/2016   NEUTROABS  4.5 08/07/2016   HGB 11.5 (L) 08/07/2016   HCT 34.1 (L) 08/07/2016   MCV 87.4 08/07/2016   PLT 222 08/07/2016      Chemistry      Component Value Date/Time   NA 133 (L) 08/07/2016 0938   NA 138 09/19/2014 1118   K 4.2 08/07/2016 0938   K 4.0 09/19/2014 1118   CL 101 08/07/2016 0938   CL 103 09/19/2014 1118   CO2 26 08/07/2016 0938   CO2 29 09/19/2014 1118   BUN 17 08/07/2016 0938   BUN 15 09/19/2014 1118   CREATININE 0.64 08/07/2016 0938   CREATININE 0.69 09/19/2014 1118      Component Value Date/Time   CALCIUM 8.6 (L) 08/07/2016 0938   CALCIUM 9.0 09/19/2014 1118   ALKPHOS 116 08/07/2016 0938   ALKPHOS 85 09/19/2014 1118   AST 21 08/07/2016 0938   AST 20 09/19/2014 1118   ALT 14 08/07/2016 0938   ALT 16 09/19/2014 1118   BILITOT 0.6 08/07/2016 0938   BILITOT 0.6 09/19/2014 1118      Results for Kennard, Ryli MORA (MRN [id number]) as of 08/07/2016 10:47  Ref. Range 02/23/2016 10:28 03/22/2016 10:55 04/22/2016 13:55 05/23/2016 10:30 07/05/2016 12:07  M Protein SerPl Elph-Mcnc Latest Ref Range: Not Observed g/dL 0.5 (H) 0.5 (H) 0.5 (H) 0.8 (H) 1.1 (H)    RADIOGRAPHIC STUDIES: I have personally reviewed the radiological images as listed and agreed with the findings in the report. No results found.   ASSESSMENT & PLAN:  Multiple myeloma not having achieved remission ([facility]) Multiple myeloma relapsed currently on pomolyst 4 mg 2 weeks on 2 week off- patient seems to be tolerating well without any major side effects. Improved clinical status.   #  Most recent myeloma numbers in Jan 2018 M protein 1.1 g [slightly elevated/rising]; kappa lambda light chain ratio= elevated since last visit up to 3.21. Normal CBC CMP.  Discussed re: rising M- proteins- need to add elo or dara. Also discussed re: potential drug reactions in length. Pt/family interested.   # Pain control/pelvic fracture-Better. Continue fentanyl patch 25 and also using oxycodone 5 mg prn.  # confusion/delirium/  slow cognitive decline/currently stable.   # History of osteonecrosis of the jaw- not on Zometa.  #Weight loss (10 lbs): s/p DieticianJolie. Improved. Continue  Remeron 15 mg for now.   # Follow-up in 4 weeks with labs/ myeloma labs [ph- [phone]/ care giver]  Orders Placed This Encounter  Procedures  . CBC with Differential    Standing Status:   Future    Standing Expiration Date:   08/07/2017  . Comprehensive metabolic panel    Standing Status:   Future    Standing Expiration Date:   08/07/2017  . Multiple Myeloma Panel (SPEP&IFE w/QIG)    Standing Status:   Future    Standing Expiration Date:   08/07/2017  . Kappa/lambda light chains    Standing Status:   Future    Standing Expiration Date:   08/07/2017   All questions were answered. The patient knows to call the clinic with any problems, questions or concerns.      Cammie Sickle, MD 08/07/2016 1:16 PM

## 2016-08-07 NOTE — Assessment & Plan Note (Addendum)
Multiple myeloma relapsed currently on pomolyst 4 mg 2 weeks on 2 week off- patient seems to be tolerating well without any major side effects. Improved clinical status.   #  Most recent myeloma numbers in Jan 2018 M protein 1.1 g [slightly elevated/rising]; kappa lambda light chain ratio= elevated since last visit up to 3.21. Normal CBC CMP.  Discussed re: rising M- proteins- need to add elo or dara. Also discussed re: potential drug reactions in length. Pt/family interested.   # Pain control/pelvic fracture-Better. Continue fentanyl patch 25 and also using oxycodone 5 mg prn.  # confusion/delirium/ slow cognitive decline/currently stable.   # History of osteonecrosis of the jaw- not on Zometa.  #Weight loss (10 lbs): s/p DieticianJolie. Improved. Continue Remeron 15 mg for now.   # Follow-up in 4 weeks with labs/ myeloma labs [ph- [phone]/ care giver]; and then plan adding antibody therapy.

## 2016-08-08 ENCOUNTER — Telehealth: Payer: Self-pay

## 2016-08-08 ENCOUNTER — Inpatient Hospital Stay: Payer: Medicare Other | Attending: Internal Medicine

## 2016-08-08 ENCOUNTER — Inpatient Hospital Stay: Payer: Medicare Other

## 2016-08-08 LAB — KAPPA/LAMBDA LIGHT CHAINS
KAPPA FREE LGHT CHN: 73.1 mg/L — AB (ref 3.3–19.4)
KAPPA, LAMDA LIGHT CHAIN RATIO: 5.94 — AB (ref 0.26–1.65)
LAMDA FREE LIGHT CHAINS: 12.3 mg/L (ref 5.7–26.3)

## 2016-08-08 NOTE — Telephone Encounter (Signed)
Nutrition   Received message to return call to [facility], caregiver for patient.  Called caregiver and left message.  Patient was to have a nutrition appointment today but not able to come to clinic.    Will await return call from caregiver.    Heaven Wandell B. Zenia Resides, [place], [place] Registered Dietitian [phone] (pager)

## 2016-08-09 LAB — MULTIPLE MYELOMA PANEL, SERUM
ALBUMIN SERPL ELPH-MCNC: 3.4 g/dL (ref 2.9–4.4)
ALPHA 1: 0.3 g/dL (ref 0.0–0.4)
Albumin/Glob SerPl: 1.1 (ref 0.7–1.7)
Alpha2 Glob SerPl Elph-Mcnc: 0.7 g/dL (ref 0.4–1.0)
B-Globulin SerPl Elph-Mcnc: 0.9 g/dL (ref 0.7–1.3)
GAMMA GLOB SERPL ELPH-MCNC: 1.4 g/dL (ref 0.4–1.8)
GLOBULIN, TOTAL: 3.3 g/dL (ref 2.2–3.9)
IGA: 65 mg/dL (ref 64–422)
IGM, SERUM: 61 mg/dL (ref 26–217)
IgG (Immunoglobin G), Serum: 1230 mg/dL (ref 700–1600)
M Protein SerPl Elph-Mcnc: 1.1 g/dL — ABNORMAL HIGH
Total Protein ELP: 6.7 g/dL (ref 6.0–8.5)

## 2016-08-15 DIAGNOSIS — J069 Acute upper respiratory infection, unspecified: Secondary | ICD-10-CM | POA: Diagnosis not present

## 2016-08-17 ENCOUNTER — Other Ambulatory Visit: Payer: Self-pay | Admitting: Internal Medicine

## 2016-08-17 DIAGNOSIS — C9002 Multiple myeloma in relapse: Secondary | ICD-10-CM

## 2016-08-22 ENCOUNTER — Other Ambulatory Visit: Payer: Self-pay | Admitting: *Deleted

## 2016-08-22 DIAGNOSIS — C9000 Multiple myeloma not having achieved remission: Secondary | ICD-10-CM

## 2016-08-22 MED ORDER — FENTANYL 25 MCG/HR TD PT72: 25.0000 ug | MEDICATED_PATCH | TRANSDERMAL | 0 refills | Status: DC

## 2016-08-22 MED ORDER — OXYCODONE HCL 5 MG PO TABS: 2.5000 mg | ORAL_TABLET | Freq: Three times a day (TID) | ORAL | 0 refills | Status: DC | PRN

## 2016-09-05 ENCOUNTER — Inpatient Hospital Stay (HOSPITAL_BASED_OUTPATIENT_CLINIC_OR_DEPARTMENT_OTHER): Payer: Medicare Other | Admitting: Internal Medicine

## 2016-09-05 ENCOUNTER — Inpatient Hospital Stay: Payer: Medicare Other | Attending: Internal Medicine

## 2016-09-05 VITALS — BP 139/83 | HR 74 | Temp 98.0°F | Ht 63.0 in | Wt 101.0 lb

## 2016-09-05 DIAGNOSIS — Z79899 Other long term (current) drug therapy: Secondary | ICD-10-CM | POA: Diagnosis not present

## 2016-09-05 DIAGNOSIS — R7989 Other specified abnormal findings of blood chemistry: Secondary | ICD-10-CM | POA: Insufficient documentation

## 2016-09-05 DIAGNOSIS — I2510 Atherosclerotic heart disease of native coronary artery without angina pectoris: Secondary | ICD-10-CM

## 2016-09-05 DIAGNOSIS — I10 Essential (primary) hypertension: Secondary | ICD-10-CM | POA: Insufficient documentation

## 2016-09-05 DIAGNOSIS — I4891 Unspecified atrial fibrillation: Secondary | ICD-10-CM | POA: Insufficient documentation

## 2016-09-05 DIAGNOSIS — C9002 Multiple myeloma in relapse: Secondary | ICD-10-CM | POA: Insufficient documentation

## 2016-09-05 DIAGNOSIS — E785 Hyperlipidemia, unspecified: Secondary | ICD-10-CM | POA: Diagnosis not present

## 2016-09-05 DIAGNOSIS — Z8739 Personal history of other diseases of the musculoskeletal system and connective tissue: Secondary | ICD-10-CM

## 2016-09-05 DIAGNOSIS — R410 Disorientation, unspecified: Secondary | ICD-10-CM | POA: Diagnosis not present

## 2016-09-05 DIAGNOSIS — Z923 Personal history of irradiation: Secondary | ICD-10-CM | POA: Diagnosis not present

## 2016-09-05 DIAGNOSIS — C9000 Multiple myeloma not having achieved remission: Secondary | ICD-10-CM

## 2016-09-05 DIAGNOSIS — Z888 Allergy status to other drugs, medicaments and biological substances status: Secondary | ICD-10-CM | POA: Insufficient documentation

## 2016-09-05 DIAGNOSIS — R52 Pain, unspecified: Secondary | ICD-10-CM | POA: Diagnosis not present

## 2016-09-05 LAB — COMPREHENSIVE METABOLIC PANEL
ALBUMIN: 3.3 g/dL — AB (ref 3.5–5.0)
ALT: 16 U/L (ref 14–54)
AST: 18 U/L (ref 15–41)
Alkaline Phosphatase: 91 U/L (ref 38–126)
Anion gap: 3 — ABNORMAL LOW (ref 5–15)
BUN: 23 mg/dL — AB (ref 6–20)
CHLORIDE: 106 mmol/L (ref 101–111)
CO2: 27 mmol/L (ref 22–32)
CREATININE: 0.74 mg/dL (ref 0.44–1.00)
Calcium: 8.5 mg/dL — ABNORMAL LOW (ref 8.9–10.3)
GFR calc Af Amer: >60 mL/min (ref >60)
GLUCOSE: 90 mg/dL (ref 65–99)
Potassium: 3.8 mmol/L (ref 3.5–5.1)
Sodium: 136 mmol/L (ref 135–145)
Total Bilirubin: 0.5 mg/dL (ref 0.3–1.2)
Total Protein: 7.2 g/dL (ref 6.5–8.1)

## 2016-09-05 LAB — CBC WITH DIFFERENTIAL/PLATELET
BASOS ABS: 0.1 10*3/uL (ref 0–0.1)
BASOS PCT: 1 %
Eosinophils Absolute: 0.1 10*3/uL (ref 0–0.7)
Eosinophils Relative: 1 %
HEMATOCRIT: 34.2 % — AB (ref 35.0–47.0)
Hemoglobin: 11.3 g/dL — ABNORMAL LOW (ref 12.0–16.0)
LYMPHS PCT: 15 %
Lymphs Abs: 0.8 10*3/uL — ABNORMAL LOW (ref 1.0–3.6)
MCH: 29.3 pg (ref 26.0–34.0)
MCHC: 33.2 g/dL (ref 32.0–36.0)
MCV: 88.4 fL (ref 80.0–100.0)
Monocytes Absolute: 0.3 10*3/uL (ref 0.2–0.9)
Monocytes Relative: 6 %
NEUTROS ABS: 4.2 10*3/uL (ref 1.4–6.5)
Neutrophils Relative %: 77 %
PLATELETS: 223 10*3/uL (ref 150–440)
RBC: 3.87 MIL/uL (ref 3.80–5.20)
RDW: 18.7 % — ABNORMAL HIGH (ref 11.5–14.5)
WBC: 5.5 10*3/uL (ref 3.6–11.0)

## 2016-09-05 NOTE — Progress Notes (Signed)
[name] OFFICE PROGRESS NOTE  Patient Care Team: Penny Late, MD as PCP - General (Family Medicine) Penny Gleason, MD (Oncology) Penny Robinette Haines, MD (General Surgery)  No matching staging information was found for the patient.   Oncology History   1. Stage III Multiple Myeloma a. 11/20/09: SPEP showed quantitative IgG 4567 mg/dL with M spike of 3.8 g/dL and IFE showing IgG monoclonal protein with light chain specificity.  [place] urine protein electrophoresis showed M spike 27% Bence Jones Protein.  a. 11/28/09: bone marrow biopsy: - PLASMA CELL DYSCRASIA CONSISTENT WITH MULTIPLE MYELOMA (55%PLASMA CELLS ON ASPIRATE, 75% BY CD138 IMMUNOHISTOCHEMISTRY,KAPPA RESTRICTED).TRILINEAGE HEMATOPOIESIS.46 XX,  Normal FISH panel. b. 11/29/09: bone survey: There are numerous, scattered 2 mm to 5 mm concentric  radiolucencies in the bony calvaria.   Two or three similar focal lucent areas in the right  femur.  There are observed changes consistent with disc disease at multiple levels of the cervical spine and multiple levels of the lumbar spine. c. 12/07/09: right humerus: Multiple tiny lucencies noted throughout the right humerus including humeral diaphysis. No fracture. d. 8/11- 4/12 Mephalan/prednisone/velcade X 6 cycles with decrease in Mspike to 1.0 gm/dl. e.osteonecrosis of jaw and diagnosed in December of 2013(Zometa was discontinued) 7.Patient was started on Velcade and Decadron   because of progressive multiple myeloma from January of 2013. 8. Started on Kyprolis 05/13 9.progressing disease on kyphorilis September of 2013 10 patient has been started on [name] daily for 21 days and one week off Decadron day1, 18 and 15   with 4 week cycle is finished palliative radiation therapy without much relief in pain(November, 2013) 11.Palliative radiation therapy to the right shoulder (December, 2014) 12 patient was on break from pomolidomide because of infection and it has  been presumed from December 05, 2014  # July 2017- skeletal survey- stable bone lesions.      Multiple myeloma not having achieved remission ([place])   11/20/2009 Initial Diagnosis    Multiple myeloma in relapse        INTERVAL HISTORY: Patient is a fair historian at best.  [name] 83 y.o.  female pleasant patient above history of Multiple myeloma currently on pomolyst is here for follow-up/Accompanied by a caregiver/daughter-in-law.  Overall patient seems to be clinically stable as per the family. Appetite is good. Not losing any weight.  Her pain is better controlled. She continues. On the fentanyl patch; taking oxycodone only as needed [maybe once every 3 days.]. No falls.  As per the family patient still continues to have episodes of confusion-not any worse. No nausea no vomiting. No skin rash. No diarrhea. No chest pain or shortness of breath or cough. Denies any tingling or numbness. She continues to walk with a walker.  REVIEW OF SYSTEMS:  A complete 10 point review of system is done which is negative except mentioned above/history of present illness.   PAST MEDICAL HISTORY :  Past Medical History:  Diagnosis Date  . Anxiety   . Arthritis   . Atrial fibrillation ([place])   . Coronary arteriosclerosis   . History of blood transfusion 2014  . Hyperlipidemia   . Hypertension   . Multiple myeloma ([place])   . Multiple myeloma in relapse ([place]) 10/29/2014  . Osteonecrosis of jaw ([place])    left    PAST SURGICAL HISTORY :   Past Surgical History:  Procedure Laterality Date  . ABDOMINAL HYSTERECTOMY    . APPENDECTOMY    . PARTIAL COLECTOMY    .  TOTAL KNEE REVISION Left     FAMILY HISTORY :   Family History  Problem Relation Age of Onset  . Cancer Father   . Heart attack Mother     SOCIAL HISTORY:   Social History  Substance Use Topics  . Smoking status: Never Smoker  . Smokeless tobacco: Never Used  . Alcohol use 1.2 oz/week    2 Glasses of wine per week     ALLERGIES:  is allergic to biaxin [clarithromycin]; demerol [meperidine]; hydrocodone; sulfa antibiotics; tramadol; and ultracet [tramadol-acetaminophen].  MEDICATIONS:  Current Outpatient Prescriptions  Medication Sig Dispense Refill  . Biotin 1 MG CAPS Take by mouth.    . Calcium-Magnesium-Vitamin D (CALCIUM 500 PO) Take by mouth.    [name] Sodium (COLACE PO) Take 2 tablets by mouth 2 (two) times daily.    . fentaNYL (DURAGESIC - DOSED MCG/HR) 25 MCG/HR patch Place 1 patch (25 mcg total) onto the skin every 3 (three) days. 10 patch 0  . mirtazapine (REMERON) 7.5 MG tablet Take 15 mg by mouth at bedtime.     . Multiple Vitamins-Minerals (CENTRUM SILVER 50+WOMEN PO) Take by mouth.    . ondansetron (ZOFRAN) 4 MG tablet Take 1 tablet (4 mg total) by mouth every 8 (eight) hours as needed for nausea or vomiting. 20 tablet 0  . oxyCODONE (OXY IR/ROXICODONE) 5 MG immediate release tablet Take 0.5 tablets (2.5 mg total) by mouth every 8 (eight) hours as needed for severe pain. 30 tablet 0  . polyethylene glycol (MIRALAX / GLYCOLAX) packet Take 17 g by mouth daily as needed for mild constipation.    Marland Kitchen POMALYST 4 MG capsule TAKE 1 CAPSULE BY MOUTH DAILY FOR 14 DAYS ON FOLLOWED BY 14 DAYS OFF. 14 capsule 0  . Probiotic Product (ALIGN PO) Take by mouth.    . prochlorperazine (COMPAZINE) 10 MG tablet Take 1 tablet (10 mg total) by mouth every 8 (eight) hours as needed for nausea or vomiting. 30 tablet 1   No current facility-administered medications for this visit.    Facility-Administered Medications Ordered in Other Visits  Medication Dose Route Frequency Provider Last Rate Last Dose  . sodium chloride 0.9 % injection 10 mL  10 mL Intravenous PRN Penny Gleason, MD   10 mL at 03/01/15 1305    PHYSICAL EXAMINATION: ECOG PERFORMANCE STATUS: 0 - Asymptomatic  BP 139/83 (BP Location: Right Wrist, Patient Position: Sitting)   Pulse 74   Temp 98 F (36.7 C) (Oral)   Ht '5\' 3"'$  (1.6 m)   Wt 101  lb (45.8 kg)   BMI 17.89 kg/m   Filed Weights   09/05/16 1106  Weight: 101 lb (45.8 kg)    GENERAL: frail-appearing elderly female patiett Alert, no distress and comfortable. Accompanied by her care giver/ daughter-in-law. Patient is walking. EYES: no pallor or icterus OROPHARYNX: no thrush or ulceration; good dentition  NECK: supple, no masses felt LYMPH:  no palpable lymphadenopathy in the cervical, axillary or inguinal regions LUNGS: clear to auscultation and  No wheeze or crackles HEART/CVS: regular rate & rhythm and no murmurs; no swelling of the legs. No tenderness.  ABDOMEN:abdomen soft, non-tender and normal bowel sounds Musculoskeletal:no cyanosis of digits and no clubbing  PSYCH: alert & oriented x 3 with fluent speech NEURO: no focal motor/sensory deficits SKIN:  no rashes or significant lesions  LABORATORY DATA:  I have reviewed the data as listed    Component Value Date/Time   NA 136 09/05/2016 1040   NA  138 09/19/2014 1118   K 3.8 09/05/2016 1040   K 4.0 09/19/2014 1118   CL 106 09/05/2016 1040   CL 103 09/19/2014 1118   CO2 27 09/05/2016 1040   CO2 29 09/19/2014 1118   GLUCOSE 90 09/05/2016 1040   GLUCOSE 91 09/19/2014 1118   BUN 23 (H) 09/05/2016 1040   BUN 15 09/19/2014 1118   CREATININE 0.74 09/05/2016 1040   CREATININE 0.69 09/19/2014 1118   CALCIUM 8.5 (L) 09/05/2016 1040   CALCIUM 9.0 09/19/2014 1118   PROT 7.2 09/05/2016 1040   PROT 6.8 09/19/2014 1118   ALBUMIN 3.3 (L) 09/05/2016 1040   ALBUMIN 3.9 09/19/2014 1118   AST 18 09/05/2016 1040   AST 20 09/19/2014 1118   ALT 16 09/05/2016 1040   ALT 16 09/19/2014 1118   ALKPHOS 91 09/05/2016 1040   ALKPHOS 85 09/19/2014 1118   BILITOT 0.5 09/05/2016 1040   BILITOT 0.6 09/19/2014 1118   GFRNONAA >60 09/05/2016 1040   GFRNONAA >60 09/19/2014 1118   GFRAA >60 09/05/2016 1040   GFRAA >60 09/19/2014 1118    No results found for: SPEP, UPEP  Lab Results  Component Value Date   WBC 5.5  09/05/2016   NEUTROABS 4.2 09/05/2016   HGB 11.3 (L) 09/05/2016   HCT 34.2 (L) 09/05/2016   MCV 88.4 09/05/2016   PLT 223 09/05/2016      Chemistry      Component Value Date/Time   NA 136 09/05/2016 1040   NA 138 09/19/2014 1118   K 3.8 09/05/2016 1040   K 4.0 09/19/2014 1118   CL 106 09/05/2016 1040   CL 103 09/19/2014 1118   CO2 27 09/05/2016 1040   CO2 29 09/19/2014 1118   BUN 23 (H) 09/05/2016 1040   BUN 15 09/19/2014 1118   CREATININE 0.74 09/05/2016 1040   CREATININE 0.69 09/19/2014 1118      Component Value Date/Time   CALCIUM 8.5 (L) 09/05/2016 1040   CALCIUM 9.0 09/19/2014 1118   ALKPHOS 91 09/05/2016 1040   ALKPHOS 85 09/19/2014 1118   AST 18 09/05/2016 1040   AST 20 09/19/2014 1118   ALT 16 09/05/2016 1040   ALT 16 09/19/2014 1118   BILITOT 0.5 09/05/2016 1040   BILITOT 0.6 09/19/2014 1118      Results for Caldera, [name] (MRN [id number]) as of 09/05/2016 11:10  Ref. Range 06/19/2011 14:28 07/11/2011 14:09 07/17/2011 14:10 07/24/2011 14:09 07/31/2011 14:02 08/14/2011 14:06 08/28/2011 13:37 09/11/2011 14:46 09/18/2011 14:22 09/25/2011 14:38 10/02/2011 14:26 10/09/2011 09:38 10/16/2011 13:29 10/23/2011 09:04 10/30/2011 10:39 11/13/2011 08:33 11/20/2011 08:11 11/27/2011 08:38 12/11/2011 10:02 12/25/2011 08:55 01/01/2012 09:02 01/23/2012 11:08 02/20/2012 14:21 03/19/2012 12:36 04/01/2012 20:07 04/02/2012 10:09 04/10/2012 13:33 04/13/2012 13:30 04/28/2012 13:46 04/28/2012 13:47 05/18/2012 12:02 06/16/2012 10:38 07/14/2012 10:48 08/11/2012 10:59 09/08/2012 11:13 10/06/2012 11:04 11/03/2012 11:10 12/15/2012 11:34 01/05/2013 11:20 02/02/2013 10:58 02/23/2013 10:32 04/06/2013 11:17 05/04/2013 10:30 05/12/2013 11:47 06/15/2013 11:45 07/29/2013 10:38 08/26/2013 10:39 10/14/2013 11:03 10/18/2013 13:10 12/09/2013 11:20 01/20/2014 11:27 04/14/2014 11:22 05/19/2014 11:46 06/23/2014 10:28 09/19/2014 11:18 10/24/2014 11:58 12/05/2014 11:38 12/05/2014 11:39 12/27/2014 11:50 01/02/2015 11:51 01/02/2015 11:52 02/02/2015 11:08 02/15/2015 15:34 03/01/2015  11:03 03/01/2015 11:03 03/29/2015 10:47 04/27/2015 10:31 04/27/2015 10:31 05/25/2015 11:05 05/25/2015 11:06 06/14/2015 16:37 06/27/2015 11:13 06/27/2015 11:14 07/25/2015 10:52 08/22/2015 15:28 09/28/2015 14:24 11/01/2015 10:59 11/29/2015 10:53 12/28/2015 10:00 01/25/2016 11:17 02/23/2016 10:28 03/22/2016 10:55 04/22/2016 13:55 05/23/2016 10:30 06/12/2016 15:18 07/05/2016 12:07 08/07/2016 09:38 09/05/2016 10:40  M Protein SerPl Elph-Mcnc Latest Ref Range: Not Observed g/dL  0.4 (H)   0.5 (H)  0.5 (H)    0.4 (H) 0.4 (H) 0.4 (H) 0.4 (H) 0.4 (H) 0.4 (H) 0.5 (H) 0.4 (H) 0.5 (H) 0.5 (H) 0.5 (H) 0.8 (H)  1.1 (H) 1.1 (H)      RADIOGRAPHIC STUDIES: I have personally reviewed the radiological images as listed and agreed with the findings in the report. No results found.   ASSESSMENT & PLAN:  Multiple myeloma not having achieved remission ([facility]) Multiple myeloma relapsed currently on pomolyst 4 mg 2 weeks on 2 week off- patient seems to be tolerating well without any major side effects. Improved/stable clinical status.   #  Most recent myeloma numbers in FEB 2018 M protein 1.1 g [slightly elevated/rising]; kappa lambda light chain ratio= elevated since last visit up to 3.21. Biochemical progression; currently no worsening anemia or renal function.    # Discussed re: rising M- proteins- need to add elo or dara. Also discussed re: potential drug reactions in length. I'm concerned about patient's tolerance to new treatments; especially frequent visits; also infusion reactions. Pt/family interested. Labs today reviewed;  Acceptable.   # Pain control/pelvic fracture-Better. Continue fentanyl patch 25 and also using oxycodone 5 mg prn.  # confusion/delirium/ slow cognitive decline/currently stable.    # History of osteonecrosis of the jaw- not on Zometa.   # Weight loss- stable/improved.  s/p DieticianJolie. Improved. Continue Remeron 15 mg for now [Dr.Baboff]  #  Follow-up in 4 weeks with labs/ myeloma labs [ph- [phone]/ care giver]; and then plan adding antibody therapy.  Orders Placed This Encounter  Procedures  . CBC with Differential    Standing Status:   Future    Standing Expiration Date:   09/05/2017  . Comprehensive metabolic panel    Standing Status:   Future    Standing Expiration Date:   09/05/2017  . Kappa/lambda light chains    Standing Status:   Future    Standing Expiration Date:   09/05/2017  . Multiple Myeloma Panel (SPEP&IFE w/QIG)    Standing Status:   Future    Standing Expiration Date:   09/05/2017   All questions were answered. The patient knows to call the clinic with any problems, questions or concerns.      Cammie Sickle, MD 09/05/2016 12:09 PM

## 2016-09-05 NOTE — Assessment & Plan Note (Addendum)
Multiple myeloma relapsed currently on pomolyst 4 mg 2 weeks on 2 week off- patient seems to be tolerating well without any major side effects. Improved/stable clinical status.   #  Most recent myeloma numbers in FEB 2018 M protein 1.1 g [slightly elevated/rising]; kappa lambda light chain ratio= elevated since last visit up to 3.21. Biochemical progression; currently no worsening anemia or renal function.    # Discussed re: rising M- proteins- need to add elo or dara. Also discussed re: potential drug reactions in length. I'm concerned about patient's tolerance to new treatments; especially frequent visits; also infusion reactions. Pt/family interested. Labs today reviewed;  Acceptable.   # Pain control/pelvic fracture-Better. Continue fentanyl patch 25 and also using oxycodone 5 mg prn.  # confusion/delirium/ slow cognitive decline/currently stable.    # History of osteonecrosis of the jaw- not on Zometa.   # Weight loss- stable/improved.  s/p DieticianJolie. Improved. Continue Remeron 15 mg for now [Dr.Baboff]  # Follow-up in 4 weeks with labs/ myeloma labs [ph- [phone]/ care giver]; and then plan adding antibody therapy.

## 2016-09-05 NOTE — Progress Notes (Signed)
Patient here for follow up no changes since last appointment. Wt is stable.

## 2016-09-06 LAB — MULTIPLE MYELOMA PANEL, SERUM
ALBUMIN SERPL ELPH-MCNC: 3.1 g/dL (ref 2.9–4.4)
ALBUMIN/GLOB SERPL: 1 (ref 0.7–1.7)
Alpha 1: 0.3 g/dL (ref 0.0–0.4)
Alpha2 Glob SerPl Elph-Mcnc: 0.7 g/dL (ref 0.4–1.0)
B-GLOBULIN SERPL ELPH-MCNC: 1 g/dL (ref 0.7–1.3)
GAMMA GLOB SERPL ELPH-MCNC: 1.5 g/dL (ref 0.4–1.8)
GLOBULIN, TOTAL: 3.4 g/dL (ref 2.2–3.9)
IgA: 63 mg/dL — ABNORMAL LOW (ref 64–422)
IgG (Immunoglobin G), Serum: 1426 mg/dL (ref 700–1600)
IgM, Serum: 57 mg/dL (ref 26–217)
M PROTEIN SERPL ELPH-MCNC: 1.2 g/dL — AB
Total Protein ELP: 6.5 g/dL (ref 6.0–8.5)

## 2016-09-06 LAB — KAPPA/LAMBDA LIGHT CHAINS
KAPPA FREE LGHT CHN: 81.6 mg/L — AB (ref 3.3–19.4)
KAPPA, LAMDA LIGHT CHAIN RATIO: 6.74 — AB (ref 0.26–1.65)
Lambda free light chains: 12.1 mg/L (ref 5.7–26.3)

## 2016-09-17 ENCOUNTER — Telehealth: Payer: Self-pay | Admitting: *Deleted

## 2016-09-17 NOTE — Telephone Encounter (Signed)
This was taken care of yesterday

## 2016-09-17 NOTE — Telephone Encounter (Signed)
Tammy from [facility] called and said this pt was prescribed Pomalyst. Her survey has been locked up and until a nurse from cancer center calls and has the account unflagged they cannot send her medication to her.    rn Contacted [employer]- pt locked up her survey and answered no regarding "that drug causes birth defects" This locked up her survey electronically. [facility] unable to process this. I explained to [employer] that this has been explained to the patient and [employer] unlocked the survey.  [place] - made aware that [employer] unlocked the acct. May go ahead and process the promalyst rx.

## 2016-09-17 NOTE — Telephone Encounter (Signed)
Cannot ship drug due to patient survey being flagged. Please take care of this so that her drug can be shipped.

## 2016-09-24 ENCOUNTER — Other Ambulatory Visit: Payer: Self-pay | Admitting: *Deleted

## 2016-09-24 DIAGNOSIS — C9000 Multiple myeloma not having achieved remission: Secondary | ICD-10-CM

## 2016-09-24 MED ORDER — FENTANYL 25 MCG/HR TD PT72: 25.0000 ug | MEDICATED_PATCH | TRANSDERMAL | 0 refills | Status: DC

## 2016-09-24 NOTE — Telephone Encounter (Signed)
Please print and call [name] [phone] when ready to pick up today

## 2016-10-01 ENCOUNTER — Inpatient Hospital Stay: Payer: Medicare Other | Attending: Internal Medicine | Admitting: Internal Medicine

## 2016-10-01 ENCOUNTER — Inpatient Hospital Stay: Payer: Medicare Other

## 2016-10-01 VITALS — BP 130/77 | HR 97 | Temp 97.3°F | Resp 18 | Ht 63.0 in | Wt 102.7 lb

## 2016-10-01 DIAGNOSIS — I10 Essential (primary) hypertension: Secondary | ICD-10-CM | POA: Insufficient documentation

## 2016-10-01 DIAGNOSIS — I2510 Atherosclerotic heart disease of native coronary artery without angina pectoris: Secondary | ICD-10-CM | POA: Diagnosis not present

## 2016-10-01 DIAGNOSIS — F0390 Unspecified dementia without behavioral disturbance: Secondary | ICD-10-CM

## 2016-10-01 DIAGNOSIS — F419 Anxiety disorder, unspecified: Secondary | ICD-10-CM | POA: Insufficient documentation

## 2016-10-01 DIAGNOSIS — R634 Abnormal weight loss: Secondary | ICD-10-CM | POA: Diagnosis not present

## 2016-10-01 DIAGNOSIS — E785 Hyperlipidemia, unspecified: Secondary | ICD-10-CM | POA: Diagnosis not present

## 2016-10-01 DIAGNOSIS — C9000 Multiple myeloma not having achieved remission: Secondary | ICD-10-CM

## 2016-10-01 DIAGNOSIS — R630 Anorexia: Secondary | ICD-10-CM | POA: Diagnosis not present

## 2016-10-01 DIAGNOSIS — Z888 Allergy status to other drugs, medicaments and biological substances status: Secondary | ICD-10-CM | POA: Diagnosis not present

## 2016-10-01 DIAGNOSIS — C9002 Multiple myeloma in relapse: Secondary | ICD-10-CM | POA: Diagnosis not present

## 2016-10-01 DIAGNOSIS — Z8739 Personal history of other diseases of the musculoskeletal system and connective tissue: Secondary | ICD-10-CM | POA: Diagnosis not present

## 2016-10-01 DIAGNOSIS — Z8781 Personal history of (healed) traumatic fracture: Secondary | ICD-10-CM | POA: Insufficient documentation

## 2016-10-01 DIAGNOSIS — I4891 Unspecified atrial fibrillation: Secondary | ICD-10-CM | POA: Diagnosis not present

## 2016-10-01 DIAGNOSIS — Z923 Personal history of irradiation: Secondary | ICD-10-CM | POA: Diagnosis not present

## 2016-10-01 DIAGNOSIS — M25559 Pain in unspecified hip: Secondary | ICD-10-CM | POA: Insufficient documentation

## 2016-10-01 DIAGNOSIS — Z9181 History of falling: Secondary | ICD-10-CM | POA: Insufficient documentation

## 2016-10-01 DIAGNOSIS — Z79899 Other long term (current) drug therapy: Secondary | ICD-10-CM | POA: Diagnosis not present

## 2016-10-01 LAB — COMPREHENSIVE METABOLIC PANEL
ALT: 13 U/L — ABNORMAL LOW (ref 14–54)
AST: 19 U/L (ref 15–41)
Albumin: 3.5 g/dL (ref 3.5–5.0)
Alkaline Phosphatase: 91 U/L (ref 38–126)
Anion gap: 6 (ref 5–15)
BUN: 21 mg/dL — ABNORMAL HIGH (ref 6–20)
CHLORIDE: 101 mmol/L (ref 101–111)
CO2: 29 mmol/L (ref 22–32)
Calcium: 8.8 mg/dL — ABNORMAL LOW (ref 8.9–10.3)
Creatinine, Ser: 0.83 mg/dL (ref 0.44–1.00)
Glucose, Bld: 104 mg/dL — ABNORMAL HIGH (ref 65–99)
POTASSIUM: 3.8 mmol/L (ref 3.5–5.1)
Sodium: 136 mmol/L (ref 135–145)
Total Bilirubin: 0.4 mg/dL (ref 0.3–1.2)
Total Protein: 7.3 g/dL (ref 6.5–8.1)

## 2016-10-01 LAB — CBC WITH DIFFERENTIAL/PLATELET
BASOS ABS: 0.1 10*3/uL (ref 0–0.1)
Basophils Relative: 1 %
EOS PCT: 2 %
Eosinophils Absolute: 0.1 10*3/uL (ref 0–0.7)
HCT: 37 % (ref 35.0–47.0)
Hemoglobin: 11.9 g/dL — ABNORMAL LOW (ref 12.0–16.0)
LYMPHS ABS: 1.1 10*3/uL (ref 1.0–3.6)
Lymphocytes Relative: 25 %
MCH: 28.8 pg (ref 26.0–34.0)
MCHC: 32.1 g/dL (ref 32.0–36.0)
MCV: 89.6 fL (ref 80.0–100.0)
MONO ABS: 0.2 10*3/uL (ref 0.2–0.9)
Monocytes Relative: 5 %
Neutro Abs: 3 10*3/uL (ref 1.4–6.5)
Neutrophils Relative %: 67 %
PLATELETS: 262 10*3/uL (ref 150–440)
RBC: 4.14 MIL/uL (ref 3.80–5.20)
RDW: 18.2 % — AB (ref 11.5–14.5)
WBC: 4.5 10*3/uL (ref 3.6–11.0)

## 2016-10-01 NOTE — Progress Notes (Signed)
Pt will start her week break on Pomalyst on 10/08/16. Pt has not.missed any dosing during this cycle. Patient denies any side effects with medication.

## 2016-10-01 NOTE — Assessment & Plan Note (Addendum)
Multiple myeloma relapsed currently on pomolyst 4 mg 2 weeks on 2 week off- patient seems to be tolerating well without any major side effects.   #  Most recent myeloma numbers in  March 2018 M protein 1.2 g [slightly elevated/rising]; kappa lambda light chain ratio= elevated since last visit up to 3.21. Biochemical progression; currently no worsening anemia or renal function. I reviewed at length with the patient and his caregiver.   # Discussed re: rising M- proteins- need to add elo or dara- currently no worsening end organ dysfunction. Also discussed re: potential drug reactions in length.   # Pain control/pelvic fracture-Better. Continue fentanyl patch 25 and also using oxycodone 5 mg prn.  # confusion/delirium/ slow cognitive decline/history of falls -currently stable.    # History of osteonecrosis of the jaw- not on Zometa.   # Weight loss- stable/improved- currently 102 pounds..  s/p DieticianJolie. Improved. Continue Remeron 15 mg for now [Dr.Baboff]  # Follow-up in 4 weeks with labs/ myeloma labs [ph- [phone]/ care giver]; follow-up with me in 5 weeks and then plan adding antibody therapy [[place] office].

## 2016-10-01 NOTE — Progress Notes (Signed)
[facility] OFFICE PROGRESS NOTE  Patient Care Team: Derinda Late, MD as PCP - General (Family Medicine) Forest Gleason, MD (Oncology) Seeplaputhur Robinette Haines, MD (General Surgery)  No matching staging information was found for the patient.   Oncology History   1. Stage III Multiple Myeloma a. 11/20/09: SPEP showed quantitative IgG 4567 mg/dL with M spike of 3.8 g/dL and IFE showing IgG monoclonal protein with light chain specificity.  [place] urine protein electrophoresis showed M spike 27% Bence Jones Protein.  a. 11/28/09: bone marrow biopsy: - PLASMA CELL DYSCRASIA CONSISTENT WITH MULTIPLE MYELOMA (55%PLASMA CELLS ON ASPIRATE, 75% BY CD138 IMMUNOHISTOCHEMISTRY,KAPPA RESTRICTED).TRILINEAGE HEMATOPOIESIS.46 XX,  Normal FISH panel. b. 11/29/09: bone survey: There are numerous, scattered 2 mm to 5 mm concentric  radiolucencies in the bony calvaria.   Two or three similar focal lucent areas in the right  femur.  There are observed changes consistent with disc disease at multiple levels of the cervical spine and multiple levels of the lumbar spine. c. 12/07/09: right humerus: Multiple tiny lucencies noted throughout the right humerus including humeral diaphysis. No fracture. d. 8/11- 4/12 Mephalan/prednisone/velcade X 6 cycles with decrease in Mspike to 1.0 gm/dl. e.osteonecrosis of jaw and diagnosed in December of 2013(Zometa was discontinued) 7.Patient was started on Velcade and Decadron   because of progressive multiple myeloma from January of 2013. 8. Started on Kyprolis 05/13 9.progressing disease on kyphorilis September of 2013 10 patient has been started on [name] daily for 21 days and one week off Decadron day1, 18 and 15   with 4 week cycle is finished palliative radiation therapy without much relief in pain(November, 2013) 11.Palliative radiation therapy to the right shoulder (December, 2014) 12 patient was on break from pomolidomide because of infection and it has  been presumed from December 05, 2014  # July 2017- skeletal survey- stable bone lesions.      Multiple myeloma not having achieved remission ([place])   11/20/2009 Initial Diagnosis    Multiple myeloma in relapse        INTERVAL HISTORY: Patient is a fair historian at best.  [name] 83 y.o.  female pleasant patient With mild-moderate dementia and above history of Multiple myeloma currently on pomolyst is here for follow-up/Accompanied by a caregiver/daughter-in-law.  Unfortunately patient seems to have a fall recently at home. She does not recollect how this happen. She is a poor appetite. However she is currently on boost stress ensure with holding steady. Her pain is better controlled. She continues. On the fentanyl patch; taking oxycodone only as needed [maybe once every 3 days].   For family intermittent episodes of confusion. Otherwise overall is improved. No nausea no vomiting. No skin rash. No diarrhea. No chest pain or shortness of breath or cough. Denies any tingling or numbness. She continues to walk with a walker.  REVIEW OF SYSTEMS:  A complete 10 point review of system is done which is negative except mentioned above/history of present illness.   PAST MEDICAL HISTORY :  Past Medical History:  Diagnosis Date  . Anxiety   . Arthritis   . Atrial fibrillation ([place])   . Coronary arteriosclerosis   . History of blood transfusion 2014  . Hyperlipidemia   . Hypertension   . Multiple myeloma ([place])   . Multiple myeloma in relapse ([facility]) 10/29/2014  . Osteonecrosis of jaw ([place])    left    PAST SURGICAL HISTORY :   Past Surgical History:  Procedure Laterality Date  . ABDOMINAL HYSTERECTOMY    .  APPENDECTOMY    . PARTIAL COLECTOMY    . TOTAL KNEE REVISION Left     FAMILY HISTORY :   Family History  Problem Relation Age of Onset  . Cancer Father   . Heart attack Mother     SOCIAL HISTORY:   Social History  Substance Use Topics  . Smoking status: Never Smoker   . Smokeless tobacco: Never Used  . Alcohol use 1.2 oz/week    2 Glasses of wine per week    ALLERGIES:  is allergic to biaxin [clarithromycin]; demerol [meperidine]; hydrocodone; sulfa antibiotics; tramadol; and ultracet [tramadol-acetaminophen].  MEDICATIONS:  Current Outpatient Prescriptions  Medication Sig Dispense Refill  . Biotin 1 MG CAPS Take 1 capsule by mouth daily.     . Calcium-Magnesium-Vitamin D (CALCIUM 500 PO) Take 1 tablet by mouth daily.     Mariane Baumgarten Sodium (COLACE PO) Take 2 tablets by mouth 2 (two) times daily.    . fentaNYL (DURAGESIC - DOSED MCG/HR) 25 MCG/HR patch Place 1 patch (25 mcg total) onto the skin every 3 (three) days. 10 patch 0  . ferrous sulfate 325 (65 FE) MG EC tablet Take 325 mg by mouth daily.    . mirtazapine (REMERON) 7.5 MG tablet Take 15 mg by mouth at bedtime.     . Multiple Vitamins-Minerals (CENTRUM SILVER 50+WOMEN PO) Take 1 tablet by mouth daily.     . polyethylene glycol (MIRALAX / GLYCOLAX) packet Take 17 g by mouth daily as needed for mild constipation.    Marland Kitchen POMALYST 4 MG capsule TAKE 1 CAPSULE BY MOUTH DAILY FOR 14 DAYS ON FOLLOWED BY 14 DAYS OFF. 14 capsule 0  . Probiotic Product (ALIGN PO) Take by mouth.    . ondansetron (ZOFRAN) 4 MG tablet Take 1 tablet (4 mg total) by mouth every 8 (eight) hours as needed for nausea or vomiting. (Patient not taking: Reported on 10/01/2016) 20 tablet 0  . oxyCODONE (OXY IR/ROXICODONE) 5 MG immediate release tablet Take 0.5 tablets (2.5 mg total) by mouth every 8 (eight) hours as needed for severe pain. (Patient not taking: Reported on 10/01/2016) 30 tablet 0  . prochlorperazine (COMPAZINE) 10 MG tablet Take 1 tablet (10 mg total) by mouth every 8 (eight) hours as needed for nausea or vomiting. (Patient not taking: Reported on 10/01/2016) 30 tablet 1   No current facility-administered medications for this visit.    Facility-Administered Medications Ordered in Other Visits  Medication Dose Route  Frequency Provider Last Rate Last Dose  . sodium chloride 0.9 % injection 10 mL  10 mL Intravenous PRN Forest Gleason, MD   10 mL at 03/01/15 1305    PHYSICAL EXAMINATION: ECOG PERFORMANCE STATUS: 0 - Asymptomatic  BP 130/77 (Patient Position: Sitting)   Pulse 97   Temp 97.3 F (36.3 C) (Tympanic)   Resp 18   Ht _0  (1.6 m)   Wt 102 lb 11.8 oz (46.6 kg)   BMI 18.20 kg/m   Filed Weights   10/01/16 1127  Weight: 102 lb 11.8 oz (46.6 kg)    GENERAL: frail-appearing elderly female patiett Alert, no distress and comfortable. Accompanied by her care giver/ daughter-in-law. Patient is walking. EYES: no pallor or icterus OROPHARYNX: no thrush or ulceration; good dentition  NECK: supple, no masses felt LYMPH:  no palpable lymphadenopathy in the cervical, axillary or inguinal regions LUNGS: clear to auscultation and  No wheeze or crackles HEART/CVS: regular rate & rhythm and no murmurs; no swelling of the legs. No tenderness.  ABDOMEN:abdomen soft, non-tender and normal bowel sounds Musculoskeletal:no cyanosis of digits and no clubbing  PSYCH: alert & oriented x 3 with fluent speech NEURO: no focal motor/sensory deficits SKIN:  no rashes or significant lesions  LABORATORY DATA:  I have reviewed the data as listed    Component Value Date/Time   NA 136 10/01/2016 1114   NA 138 09/19/2014 1118   K 3.8 10/01/2016 1114   K 4.0 09/19/2014 1118   CL 101 10/01/2016 1114   CL 103 09/19/2014 1118   CO2 29 10/01/2016 1114   CO2 29 09/19/2014 1118   GLUCOSE 104 (H) 10/01/2016 1114   GLUCOSE 91 09/19/2014 1118   BUN 21 (H) 10/01/2016 1114   BUN 15 09/19/2014 1118   CREATININE 0.83 10/01/2016 1114   CREATININE 0.69 09/19/2014 1118   CALCIUM 8.8 (L) 10/01/2016 1114   CALCIUM 9.0 09/19/2014 1118   PROT 7.3 10/01/2016 1114   PROT 6.8 09/19/2014 1118   ALBUMIN 3.5 10/01/2016 1114   ALBUMIN 3.9 09/19/2014 1118   AST 19 10/01/2016 1114   AST 20 09/19/2014 1118   ALT 13 (L) 10/01/2016  1114   ALT 16 09/19/2014 1118   ALKPHOS 91 10/01/2016 1114   ALKPHOS 85 09/19/2014 1118   BILITOT 0.4 10/01/2016 1114   BILITOT 0.6 09/19/2014 1118   GFRNONAA >60 10/01/2016 1114   GFRNONAA >60 09/19/2014 1118   GFRAA >60 10/01/2016 1114   GFRAA >60 09/19/2014 1118    No results found for: SPEP, UPEP  Lab Results  Component Value Date   WBC 4.5 10/01/2016   NEUTROABS 3.0 10/01/2016   HGB 11.9 (L) 10/01/2016   HCT 37.0 10/01/2016   MCV 89.6 10/01/2016   PLT 262 10/01/2016      Chemistry      Component Value Date/Time   NA 136 10/01/2016 1114   NA 138 09/19/2014 1118   K 3.8 10/01/2016 1114   K 4.0 09/19/2014 1118   CL 101 10/01/2016 1114   CL 103 09/19/2014 1118   CO2 29 10/01/2016 1114   CO2 29 09/19/2014 1118   BUN 21 (H) 10/01/2016 1114   BUN 15 09/19/2014 1118   CREATININE 0.83 10/01/2016 1114   CREATININE 0.69 09/19/2014 1118      Component Value Date/Time   CALCIUM 8.8 (L) 10/01/2016 1114   CALCIUM 9.0 09/19/2014 1118   ALKPHOS 91 10/01/2016 1114   ALKPHOS 85 09/19/2014 1118   AST 19 10/01/2016 1114   AST 20 09/19/2014 1118   ALT 13 (L) 10/01/2016 1114   ALT 16 09/19/2014 1118   BILITOT 0.4 10/01/2016 1114   BILITOT 0.6 09/19/2014 1118      Results for Loureiro, Rachyl MORA (MRN [id number]) as of 10/01/2016 11:59  Ref. Range 06/19/2011 14:28 07/11/2011 14:09 07/17/2011 14:10 07/24/2011 14:09 07/31/2011 14:02 08/14/2011 14:06 08/28/2011 13:37 09/11/2011 14:46 09/18/2011 14:22 09/25/2011 14:38 10/02/2011 14:26 10/09/2011 09:38 10/16/2011 13:29 10/23/2011 09:04 10/30/2011 10:39 11/13/2011 08:33 11/20/2011 08:11 11/27/2011 08:38 12/11/2011 10:02 12/25/2011 08:55 01/01/2012 09:02 01/23/2012 11:08 02/20/2012 14:21 03/19/2012 12:36 04/01/2012 20:07 04/02/2012 10:09 04/10/2012 13:33 04/13/2012 13:30 04/28/2012 13:46 04/28/2012 13:47 05/18/2012 12:02 06/16/2012 10:38 07/14/2012 10:48 08/11/2012 10:59 09/08/2012 11:13 10/06/2012 11:04 11/03/2012 11:10 12/15/2012 11:34 01/05/2013 11:20 02/02/2013 10:58 02/23/2013 10:32  04/06/2013 11:17 05/04/2013 10:30 05/12/2013 11:47 06/15/2013 11:45 07/29/2013 10:38 08/26/2013 10:39 10/14/2013 11:03 10/18/2013 13:10 12/09/2013 11:20 01/20/2014 11:27 04/14/2014 11:22 05/19/2014 11:46 06/23/2014 10:28 09/19/2014 11:18 10/24/2014 11:58 12/05/2014 11:38 12/05/2014 11:39 12/27/2014 11:50 01/02/2015 11:51 01/02/2015 11:52 02/02/2015 11:08 02/15/2015 15:34  03/01/2015 11:03 03/01/2015 11:03 03/29/2015 10:47 04/27/2015 10:31 04/27/2015 10:31 05/25/2015 11:05 05/25/2015 11:06 06/14/2015 16:37 06/27/2015 11:13 06/27/2015 11:14 07/25/2015 10:52 08/22/2015 15:28 09/28/2015 14:24 11/01/2015 10:59 11/29/2015 10:53 12/28/2015 10:00 01/25/2016 11:17 02/23/2016 10:28 03/22/2016 10:55 04/22/2016 13:55 05/23/2016 10:30 06/12/2016 15:18 07/05/2016 12:07 08/07/2016 09:38 09/05/2016 10:40 10/01/2016 11:14  M Protein SerPl Elph-Mcnc Latest Ref Range: Not Observed g/dL                                                                0.4 (H)   0.5 (H)  0.5 (H)    0.4 (H) 0.4 (H) 0.4 (H) 0.4 (H) 0.4 (H) 0.4 (H) 0.5 (H) 0.4 (H) 0.5 (H) 0.5 (H) 0.5 (H) 0.8 (H)  1.1 (H) 1.1 (H) 1.2 (H)       RADIOGRAPHIC STUDIES: I have personally reviewed the radiological images as listed and agreed with the findings in the report. No results found.   ASSESSMENT & PLAN:  Multiple myeloma not having achieved remission ([facility]) Multiple myeloma relapsed currently on pomolyst 4 mg 2 weeks on 2 week off- patient seems to be tolerating well without any major side effects.   #  Most recent myeloma numbers in  March 2018 M protein 1.2 g [slightly elevated/rising]; kappa lambda light chain ratio= elevated since last visit up to 3.21. Biochemical progression; currently no worsening anemia or renal function. I reviewed at length with the patient and his caregiver.   # Discussed re: rising M- proteins- need to add elo or dara- currently no worsening end organ dysfunction. Also discussed re: potential drug reactions in length.   # Pain control/pelvic fracture-Better. Continue  fentanyl patch 25 and also using oxycodone 5 mg prn.  # confusion/delirium/ slow cognitive decline/history of falls -currently stable.    # History of osteonecrosis of the jaw- not on Zometa.   # Weight loss- stable/improved- currently 102 pounds..  s/p DieticianJolie. Improved. Continue Remeron 15 mg for now [Dr.Baboff]  # Follow-up in 4 weeks with labs/ myeloma labs [ph- [phone]/ care giver]; follow-up with me in 5 weeks and then plan adding antibody therapy [[place] office].   Orders Placed This Encounter  Procedures  . CBC with Differential/Platelet    Standing Status:   Future    Standing Expiration Date:   10/01/2017  . Comprehensive metabolic panel    Standing Status:   Future    Standing Expiration Date:   10/01/2017  . Kappa/lambda light chains    Standing Status:   Future    Standing Expiration Date:   10/01/2017  . Multiple Myeloma Panel (SPEP&IFE w/QIG)    Standing Status:   Future    Standing Expiration Date:   10/01/2017   All questions were answered. The patient knows to call the clinic with any problems, questions or concerns.      Cammie Sickle, MD 10/01/2016 12:16 PM

## 2016-10-02 LAB — KAPPA/LAMBDA LIGHT CHAINS
KAPPA FREE LGHT CHN: 122 mg/L — AB (ref 3.3–19.4)
KAPPA, LAMDA LIGHT CHAIN RATIO: 10.43 — AB (ref 0.26–1.65)
LAMDA FREE LIGHT CHAINS: 11.7 mg/L (ref 5.7–26.3)

## 2016-10-03 ENCOUNTER — Inpatient Hospital Stay: Payer: Medicare Other | Admitting: Internal Medicine

## 2016-10-03 ENCOUNTER — Inpatient Hospital Stay: Payer: Medicare Other

## 2016-10-03 NOTE — Progress Notes (Signed)
Nutrition Follow-up:  Nutrition follow-up visit scheduled following MD visit but this has been rescheduled to [facility] office.    Spoke with caregiver [name] via phone this am for nutrition follow-up.  [name] reports appetite is good some days and not so good other days.  Reports that she feels like remeron has helped with appetite.  Reports patient eats boiled egg with every meal as not eating much meat.  Mainly eats soups for lunch and dinner with beans, vegetables, chopped meat.  Has ensure plus milkshake with ice cream every night.  Snacks on greek yogurt and fruit.  Reports she made a lasagna with black beans recently and patient ate that well.    No other nutrition symptoms reported at this time  Medications: remeron  Labs: reviewed  Anthropometrics:   Noted weight increased to 102 pounds 11.8 oz on 4/24 from 90 lb on 2/1 nutrition visit   NUTRITION DIAGNOSIS: Unintentional weight loss improving   MALNUTRITION DIAGNOSIS: Severe malnutrition improving   INTERVENTION:   Encouraged caregiver, [name] to continue to offer high calorie, high protein foods every 2-3 hours to improve nutrition. Continue ensure plus milkshake every night for additional calories and protein Contact information given to [name] and she knows to contact me with questions or concerns    MONITORING, EVALUATION, GOAL: Patient will consume adequate calories and protein to prevent further weight loss   NEXT VISIT: as needed.  Caregiver to contact me.  Bryant Saye B. Zenia Resides, [place], [place] Registered Dietitian [phone] (pager)

## 2016-10-04 ENCOUNTER — Other Ambulatory Visit: Payer: Medicare Other

## 2016-10-04 ENCOUNTER — Ambulatory Visit: Payer: Medicare Other | Admitting: Internal Medicine

## 2016-10-04 LAB — MULTIPLE MYELOMA PANEL, SERUM
ALBUMIN/GLOB SERPL: 1.1 (ref 0.7–1.7)
ALPHA2 GLOB SERPL ELPH-MCNC: 0.6 g/dL (ref 0.4–1.0)
Albumin SerPl Elph-Mcnc: 3.5 g/dL (ref 2.9–4.4)
Alpha 1: 0.2 g/dL (ref 0.0–0.4)
B-Globulin SerPl Elph-Mcnc: 0.9 g/dL (ref 0.7–1.3)
Gamma Glob SerPl Elph-Mcnc: 1.6 g/dL (ref 0.4–1.8)
Globulin, Total: 3.3 g/dL (ref 2.2–3.9)
IGA: 58 mg/dL — AB (ref 64–422)
IGG (IMMUNOGLOBIN G), SERUM: 1499 mg/dL (ref 700–1600)
IGM, SERUM: 54 mg/dL (ref 26–217)
M Protein SerPl Elph-Mcnc: 1.3 g/dL — ABNORMAL HIGH
TOTAL PROTEIN ELP: 6.8 g/dL (ref 6.0–8.5)

## 2016-10-14 ENCOUNTER — Telehealth: Payer: Self-pay | Admitting: *Deleted

## 2016-10-14 NOTE — Telephone Encounter (Signed)
[employer] authorization [id number].  Added to the Rx and faxed to [facility] as requested.

## 2016-10-14 NOTE — Telephone Encounter (Signed)
Recent Pomalyst rx did not have [place] number on it, please get auth # and refax or call it to them

## 2016-10-29 ENCOUNTER — Inpatient Hospital Stay: Payer: Medicare Other | Attending: Internal Medicine

## 2016-10-29 DIAGNOSIS — R634 Abnormal weight loss: Secondary | ICD-10-CM | POA: Insufficient documentation

## 2016-10-29 DIAGNOSIS — F0390 Unspecified dementia without behavioral disturbance: Secondary | ICD-10-CM | POA: Diagnosis not present

## 2016-10-29 DIAGNOSIS — Z79899 Other long term (current) drug therapy: Secondary | ICD-10-CM | POA: Insufficient documentation

## 2016-10-29 DIAGNOSIS — Z888 Allergy status to other drugs, medicaments and biological substances status: Secondary | ICD-10-CM | POA: Diagnosis not present

## 2016-10-29 DIAGNOSIS — Z8739 Personal history of other diseases of the musculoskeletal system and connective tissue: Secondary | ICD-10-CM | POA: Insufficient documentation

## 2016-10-29 DIAGNOSIS — E785 Hyperlipidemia, unspecified: Secondary | ICD-10-CM | POA: Insufficient documentation

## 2016-10-29 DIAGNOSIS — Z8781 Personal history of (healed) traumatic fracture: Secondary | ICD-10-CM | POA: Diagnosis not present

## 2016-10-29 DIAGNOSIS — I10 Essential (primary) hypertension: Secondary | ICD-10-CM | POA: Insufficient documentation

## 2016-10-29 DIAGNOSIS — I2510 Atherosclerotic heart disease of native coronary artery without angina pectoris: Secondary | ICD-10-CM | POA: Diagnosis not present

## 2016-10-29 DIAGNOSIS — C9002 Multiple myeloma in relapse: Secondary | ICD-10-CM | POA: Diagnosis not present

## 2016-10-29 DIAGNOSIS — F419 Anxiety disorder, unspecified: Secondary | ICD-10-CM | POA: Insufficient documentation

## 2016-10-29 DIAGNOSIS — G4700 Insomnia, unspecified: Secondary | ICD-10-CM | POA: Insufficient documentation

## 2016-10-29 DIAGNOSIS — Z9181 History of falling: Secondary | ICD-10-CM | POA: Insufficient documentation

## 2016-10-29 DIAGNOSIS — Z923 Personal history of irradiation: Secondary | ICD-10-CM | POA: Diagnosis not present

## 2016-10-29 DIAGNOSIS — I4891 Unspecified atrial fibrillation: Secondary | ICD-10-CM | POA: Insufficient documentation

## 2016-10-29 DIAGNOSIS — C9000 Multiple myeloma not having achieved remission: Secondary | ICD-10-CM

## 2016-10-29 LAB — CBC WITH DIFFERENTIAL/PLATELET
BASOS ABS: 0.1 10*3/uL (ref 0–0.1)
Basophils Relative: 1 %
Eosinophils Absolute: 0.1 10*3/uL (ref 0–0.7)
Eosinophils Relative: 1 %
HEMATOCRIT: 36.8 % (ref 35.0–47.0)
Hemoglobin: 11.8 g/dL — ABNORMAL LOW (ref 12.0–16.0)
LYMPHS ABS: 0.9 10*3/uL — AB (ref 1.0–3.6)
LYMPHS PCT: 18 %
MCH: 28.8 pg (ref 26.0–34.0)
MCHC: 32 g/dL (ref 32.0–36.0)
MCV: 90 fL (ref 80.0–100.0)
Monocytes Absolute: 0.3 10*3/uL (ref 0.2–0.9)
Monocytes Relative: 6 %
Neutro Abs: 3.8 10*3/uL (ref 1.4–6.5)
Neutrophils Relative %: 74 %
Platelets: 223 10*3/uL (ref 150–440)
RBC: 4.09 MIL/uL (ref 3.80–5.20)
RDW: 17.1 % — ABNORMAL HIGH (ref 11.5–14.5)
WBC: 5.1 10*3/uL (ref 3.6–11.0)

## 2016-10-29 LAB — COMPREHENSIVE METABOLIC PANEL
ALT: 12 U/L — AB (ref 14–54)
AST: 17 U/L (ref 15–41)
Albumin: 3.4 g/dL — ABNORMAL LOW (ref 3.5–5.0)
Alkaline Phosphatase: 79 U/L (ref 38–126)
Anion gap: 6 (ref 5–15)
BUN: 21 mg/dL — AB (ref 6–20)
CO2: 28 mmol/L (ref 22–32)
CREATININE: 0.75 mg/dL (ref 0.44–1.00)
Calcium: 8.6 mg/dL — ABNORMAL LOW (ref 8.9–10.3)
Chloride: 101 mmol/L (ref 101–111)
Glucose, Bld: 95 mg/dL (ref 65–99)
Potassium: 4.1 mmol/L (ref 3.5–5.1)
Sodium: 135 mmol/L (ref 135–145)
TOTAL PROTEIN: 7.1 g/dL (ref 6.5–8.1)
Total Bilirubin: 0.6 mg/dL (ref 0.3–1.2)

## 2016-10-30 LAB — KAPPA/LAMBDA LIGHT CHAINS
KAPPA FREE LGHT CHN: 113.3 mg/L — AB (ref 3.3–19.4)
Kappa, lambda light chain ratio: 8.78 — ABNORMAL HIGH (ref 0.26–1.65)
LAMDA FREE LIGHT CHAINS: 12.9 mg/L (ref 5.7–26.3)

## 2016-10-31 LAB — MULTIPLE MYELOMA PANEL, SERUM
ALBUMIN/GLOB SERPL: 1 (ref 0.7–1.7)
ALPHA 1: 0.3 g/dL (ref 0.0–0.4)
Albumin SerPl Elph-Mcnc: 3.2 g/dL (ref 2.9–4.4)
Alpha2 Glob SerPl Elph-Mcnc: 0.6 g/dL (ref 0.4–1.0)
B-Globulin SerPl Elph-Mcnc: 0.9 g/dL (ref 0.7–1.3)
GAMMA GLOB SERPL ELPH-MCNC: 1.6 g/dL (ref 0.4–1.8)
GLOBULIN, TOTAL: 3.4 g/dL (ref 2.2–3.9)
IGA: 43 mg/dL — AB (ref 64–422)
IgG (Immunoglobin G), Serum: 1512 mg/dL (ref 700–1600)
IgM, Serum: 52 mg/dL (ref 26–217)
M Protein SerPl Elph-Mcnc: 1.4 g/dL — ABNORMAL HIGH
Total Protein ELP: 6.6 g/dL (ref 6.0–8.5)

## 2016-11-05 ENCOUNTER — Inpatient Hospital Stay (HOSPITAL_BASED_OUTPATIENT_CLINIC_OR_DEPARTMENT_OTHER): Payer: Medicare Other | Admitting: Internal Medicine

## 2016-11-05 VITALS — BP 118/68 | HR 66 | Temp 97.7°F | Resp 16 | Ht 63.0 in | Wt 103.0 lb

## 2016-11-05 DIAGNOSIS — I4891 Unspecified atrial fibrillation: Secondary | ICD-10-CM | POA: Diagnosis not present

## 2016-11-05 DIAGNOSIS — C9002 Multiple myeloma in relapse: Secondary | ICD-10-CM

## 2016-11-05 DIAGNOSIS — E785 Hyperlipidemia, unspecified: Secondary | ICD-10-CM

## 2016-11-05 DIAGNOSIS — F0390 Unspecified dementia without behavioral disturbance: Secondary | ICD-10-CM

## 2016-11-05 DIAGNOSIS — Z9181 History of falling: Secondary | ICD-10-CM | POA: Diagnosis not present

## 2016-11-05 DIAGNOSIS — F419 Anxiety disorder, unspecified: Secondary | ICD-10-CM | POA: Diagnosis not present

## 2016-11-05 DIAGNOSIS — I2510 Atherosclerotic heart disease of native coronary artery without angina pectoris: Secondary | ICD-10-CM | POA: Diagnosis not present

## 2016-11-05 DIAGNOSIS — Z888 Allergy status to other drugs, medicaments and biological substances status: Secondary | ICD-10-CM

## 2016-11-05 DIAGNOSIS — R634 Abnormal weight loss: Secondary | ICD-10-CM | POA: Diagnosis not present

## 2016-11-05 DIAGNOSIS — I10 Essential (primary) hypertension: Secondary | ICD-10-CM

## 2016-11-05 DIAGNOSIS — Z8781 Personal history of (healed) traumatic fracture: Secondary | ICD-10-CM

## 2016-11-05 DIAGNOSIS — G4700 Insomnia, unspecified: Secondary | ICD-10-CM | POA: Diagnosis not present

## 2016-11-05 DIAGNOSIS — C9000 Multiple myeloma not having achieved remission: Secondary | ICD-10-CM

## 2016-11-05 DIAGNOSIS — Z8739 Personal history of other diseases of the musculoskeletal system and connective tissue: Secondary | ICD-10-CM

## 2016-11-05 DIAGNOSIS — Z79899 Other long term (current) drug therapy: Secondary | ICD-10-CM

## 2016-11-05 DIAGNOSIS — Z923 Personal history of irradiation: Secondary | ICD-10-CM | POA: Diagnosis not present

## 2016-11-05 MED ORDER — RISPERIDONE 0.25 MG PO TABS: 0.2500 mg | ORAL_TABLET | Freq: Every day | ORAL | 3 refills | Status: DC

## 2016-11-05 MED ORDER — OXYCODONE HCL 5 MG PO TABS: 2.5000 mg | ORAL_TABLET | Freq: Three times a day (TID) | ORAL | 0 refills | Status: DC | PRN

## 2016-11-05 MED ORDER — FENTANYL 25 MCG/HR TD PT72: 25.0000 ug | MEDICATED_PATCH | TRANSDERMAL | 0 refills | Status: DC

## 2016-11-05 NOTE — Assessment & Plan Note (Addendum)
Multiple myeloma relapsed currently on pomolyst 4 mg 2 weeks on 2 week off- patient seems to be tolerating well without any major side effects. However slow progression noted [C discussion below]  #  Most recent myeloma numbers in  May 2018 M protein 1.4 g [slightly elevated/rising]; kappa [113] lambda light chain ratio= elevated; up to 8.7. Biochemical progression; currently no worsening anemia [stable 10-11] or renal function. I reviewed at length with the patient and his caregiver.   # Discussed re: rising M- proteins- need to adding dara- currently no worsening end organ dysfunction. Discussed re: potential drug reactions in length; long infusion time. Risk of shingles. She will need shingles prophylaxis. Also discussed regarding premedication with- steroids/Claritin/Singulair.   # Pain control/pelvic fracture-Better. Continue fentanyl patch 25 and also using oxycodone 5 mg prn. New scripts given.   # confusion/delirium/ slow cognitive decline/history of falls -currently stable. However given anxiety/difficulty sleeping at night- recommend adding-0.25 respiridal qhs.  # History of osteonecrosis of the jaw- not on Zometa.   # Weight loss- stable/improved- currently 102 pounds. Continue Remeron.  # port flush at next visit.   # Follow-up in 5 weeks with labs/ myeloma labs/[[place] office].; port flush [ph- [phone] care giver]; follow-up with me in 1 week and then plan adding antibody therapy

## 2016-11-05 NOTE — Patient Instructions (Signed)
Fall Prevention in the Home Falls can cause injuries. They can happen to people of all ages. There are many things you can do to make your home safe and to help prevent falls. What can I do on the outside of my home?  Regularly fix the edges of walkways and driveways and fix any cracks.  Remove anything that might make you trip as you walk through a door, such as a raised step or threshold.  Trim any bushes or trees on the path to your home.  Use bright outdoor lighting.  Clear any walking paths of anything that might make someone trip, such as rocks or tools.  Regularly check to see if handrails are loose or broken. Make sure that both sides of any steps have handrails.  Any raised decks and porches should have guardrails on the edges.  Have any leaves, snow, or ice cleared regularly.  Use sand or salt on walking paths during winter.  Clean up any spills in your garage right away. This includes oil or grease spills. What can I do in the bathroom?  Use night lights.  Install grab bars by the toilet and in the tub and shower. Do not use towel bars as grab bars.  Use non-skid mats or decals in the tub or shower.  If you need to sit down in the shower, use a plastic, non-slip stool.  Keep the floor dry. Clean up any water that spills on the floor as soon as it happens.  Remove soap buildup in the tub or shower regularly.  Attach bath mats securely with double-sided non-slip rug tape.  Do not have throw rugs and other things on the floor that can make you trip. What can I do in the bedroom?  Use night lights.  Make sure that you have a light by your bed that is easy to reach.  Do not use any sheets or blankets that are too big for your bed. They should not hang down onto the floor.  Have a firm chair that has side arms. You can use this for support while you get dressed.  Do not have throw rugs and other things on the floor that can make you trip. What can I do in the  kitchen?  Clean up any spills right away.  Avoid walking on wet floors.  Keep items that you use a lot in easy-to-reach places.  If you need to reach something above you, use a strong step stool that has a grab bar.  Keep electrical cords out of the way.  Do not use floor polish or wax that makes floors slippery. If you must use wax, use non-skid floor wax.  Do not have throw rugs and other things on the floor that can make you trip. What can I do with my stairs?  Do not leave any items on the stairs.  Make sure that there are handrails on both sides of the stairs and use them. Fix handrails that are broken or loose. Make sure that handrails are as long as the stairways.  Check any carpeting to make sure that it is firmly attached to the stairs. Fix any carpet that is loose or worn.  Avoid having throw rugs at the top or bottom of the stairs. If you do have throw rugs, attach them to the floor with carpet tape.  Make sure that you have a light switch at the top of the stairs and the bottom of the stairs. If you do   not have them, ask someone to add them for you. What else can I do to help prevent falls?  Wear shoes that:  Do not have high heels.  Have rubber bottoms.  Are comfortable and fit you well.  Are closed at the toe. Do not wear sandals.  If you use a stepladder:  Make sure that it is fully opened. Do not climb a closed stepladder.  Make sure that both sides of the stepladder are locked into place.  Ask someone to hold it for you, if possible.  Clearly mark and make sure that you can see:  Any grab bars or handrails.  First and last steps.  Where the edge of each step is.  Use tools that help you move around (mobility aids) if they are needed. These include:  Canes.  Walkers.  Scooters.  Crutches.  Turn on the lights when you go into a dark area. Replace any light bulbs as soon as they burn out.  Set up your furniture so you have a clear path.  Avoid moving your furniture around.  If any of your floors are uneven, fix them.  If there are any pets around you, be aware of where they are.  Review your medicines with your doctor. Some medicines can make you feel dizzy. This can increase your chance of falling. Ask your doctor what other things that you can do to help prevent falls. This information is not intended to replace advice given to you by your health care provider. Make sure you discuss any questions you have with your health care provider. Document Released: 03/23/2009 Document Revised: 11/02/2015 Document Reviewed: 07/01/2014 Elsevier Interactive Patient Education  2017 [employer]  

## 2016-11-05 NOTE — Progress Notes (Signed)
[facility] OFFICE PROGRESS NOTE  Patient Care Team: Derinda Late, MD as PCP - General (Family Medicine) Forest Gleason, MD (Oncology) Christene Lye, MD (General Surgery)  No matching staging information was found for the patient.   Oncology History   1. Stage III Multiple Myeloma a. 11/20/09: SPEP showed quantitative IgG 4567 mg/dL with M spike of 3.8 g/dL and IFE showing IgG monoclonal protein with light chain specificity.  [place] urine protein electrophoresis showed M spike 27% Bence Jones Protein.  a. 11/28/09: bone marrow biopsy: - PLASMA CELL DYSCRASIA CONSISTENT WITH MULTIPLE MYELOMA (55%PLASMA CELLS ON ASPIRATE, 75% BY CD138 IMMUNOHISTOCHEMISTRY,KAPPA RESTRICTED).TRILINEAGE HEMATOPOIESIS.46 XX,  Normal FISH panel. b. 11/29/09: bone survey: There are numerous, scattered 2 mm to 5 mm concentric  radiolucencies in the bony calvaria.   Two or three similar focal lucent areas in the right  femur.  There are observed changes consistent with disc disease at multiple levels of the cervical spine and multiple levels of the lumbar spine. c. 12/07/09: right humerus: Multiple tiny lucencies noted throughout the right humerus including humeral diaphysis. No fracture. d. 8/11- 4/12 Mephalan/prednisone/velcade X 6 cycles with decrease in Mspike to 1.0 gm/dl. e.osteonecrosis of jaw and diagnosed in December of 2013(Zometa was discontinued) 7.Patient was started on Velcade and Decadron   because of progressive multiple myeloma from January of 2013. 8. Started on Kyprolis 05/13 9.progressing disease on kyphorilis September of 2013 10 patient has been started on [name] daily for 21 days and one week off Decadron day1, 18 and 15   with 4 week cycle is finished palliative radiation therapy without much relief in pain(November, 2013) 11.Palliative radiation therapy to the right shoulder (December, 2014) 12 patient was on break from pomolidomide because of infection and it has  been presumed from December 05, 2014  # July 2017- skeletal survey- stable bone lesions.      Multiple myeloma not having achieved remission ([facility])   11/20/2009 Initial Diagnosis    Multiple myeloma in relapse        INTERVAL HISTORY: Patient is a fair historian at best.  [name] 83 y.o.  female pleasant patient With mild-moderate dementia and above history of Multiple myeloma currently on pomolyst is here for follow-up/Accompanied by a caregiver/daughter-in-law.  Patient again had a recent fall at home which she attributes to sleeping on her leg. She denies any tingling or numbness. Her weight is steady. Her appetite is fair. Her pain is stable on fentanyl patch and oxycodone-taking only as needed.  Although patient denies- family insisted patient continues to have intermittent episodes of confusion at home. Anxiety spells and also difficulty sleeping at night.    No nausea no vomiting. No skin rash. No diarrhea. No chest pain or shortness of breath or cough. Denies any tingling or numbness.   REVIEW OF SYSTEMS:  A complete 10 point review of system is done which is negative except mentioned above/history of present illness.   PAST MEDICAL HISTORY :  Past Medical History:  Diagnosis Date  . Anxiety   . Arthritis   . Atrial fibrillation ([place])   . Coronary arteriosclerosis   . History of blood transfusion 2014  . Hyperlipidemia   . Hypertension   . Multiple myeloma ([place])   . Multiple myeloma in relapse ([place]) 10/29/2014  . Osteonecrosis of jaw ([place])    left    PAST SURGICAL HISTORY :   Past Surgical History:  Procedure Laterality Date  . ABDOMINAL HYSTERECTOMY    .  APPENDECTOMY    . PARTIAL COLECTOMY    . TOTAL KNEE REVISION Left     FAMILY HISTORY :   Family History  Problem Relation Age of Onset  . Cancer Father   . Heart attack Mother     SOCIAL HISTORY:   Social History  Substance Use Topics  . Smoking status: Never Smoker  . Smokeless tobacco:  Never Used  . Alcohol use 1.2 oz/week    2 Glasses of wine per week    ALLERGIES:  is allergic to biaxin [clarithromycin]; demerol [meperidine]; hydrocodone; sulfa antibiotics; tramadol; and ultracet [tramadol-acetaminophen].  MEDICATIONS:  Current Outpatient Prescriptions  Medication Sig Dispense Refill  . Biotin 1 MG CAPS Take 1 capsule by mouth daily.     . Calcium-Magnesium-Vitamin D (CALCIUM 500 PO) Take 1 tablet by mouth daily.     Mariane Baumgarten Sodium (COLACE PO) Take 2 tablets by mouth 2 (two) times daily.    . fentaNYL (DURAGESIC - DOSED MCG/HR) 25 MCG/HR patch Place 1 patch (25 mcg total) onto the skin every 3 (three) days. 10 patch 0  . ferrous sulfate 325 (65 FE) MG EC tablet Take 325 mg by mouth daily.    . mirtazapine (REMERON) 7.5 MG tablet Take 15 mg by mouth at bedtime.     . Multiple Vitamins-Minerals (CENTRUM SILVER 50+WOMEN PO) Take 1 tablet by mouth daily.     Marland Kitchen oxyCODONE (OXY IR/ROXICODONE) 5 MG immediate release tablet Take 0.5 tablets (2.5 mg total) by mouth every 8 (eight) hours as needed for severe pain. 30 tablet 0  . polyethylene glycol (MIRALAX / GLYCOLAX) packet Take 17 g by mouth daily as needed for mild constipation.    . ondansetron (ZOFRAN) 4 MG tablet Take 1 tablet (4 mg total) by mouth every 8 (eight) hours as needed for nausea or vomiting. (Patient not taking: Reported on 10/01/2016) 20 tablet 0  . POMALYST 4 MG capsule TAKE 1 CAPSULE BY MOUTH DAILY FOR 14 DAYS ON FOLLOWED BY 14 DAYS OFF. (Patient not taking: Reported on 11/05/2016) 14 capsule 0  . Probiotic Product (ALIGN PO) Take by mouth.    . prochlorperazine (COMPAZINE) 10 MG tablet Take 1 tablet (10 mg total) by mouth every 8 (eight) hours as needed for nausea or vomiting. (Patient not taking: Reported on 10/01/2016) 30 tablet 1  . risperiDONE (RISPERDAL) 0.25 MG tablet Take 1 tablet (0.25 mg total) by mouth at bedtime. 30 tablet 3   No current facility-administered medications for this visit.     Facility-Administered Medications Ordered in Other Visits  Medication Dose Route Frequency Provider Last Rate Last Dose  . sodium chloride 0.9 % injection 10 mL  10 mL Intravenous PRN Forest Gleason, MD   10 mL at 03/01/15 1305    PHYSICAL EXAMINATION: ECOG PERFORMANCE STATUS: 0 - Asymptomatic  BP 118/68 (BP Location: Left Arm, Patient Position: Sitting)   Pulse 66   Temp 97.7 F (36.5 C) (Tympanic)   Resp 16   Ht _0  (1.6 m)   Wt 103 lb (46.7 kg)   BMI 18.25 kg/m   Filed Weights   11/05/16 1104  Weight: 103 lb (46.7 kg)    GENERAL: frail-appearing elderly female patiett Alert, no distress and comfortable. Accompanied by her care giver/ daughter-in-law. Patient is walking. EYES: no pallor or icterus OROPHARYNX: no thrush or ulceration; good dentition  NECK: supple, no masses felt LYMPH:  no palpable lymphadenopathy in the cervical, axillary or inguinal regions LUNGS: clear to auscultation and  No wheeze or crackles HEART/CVS: regular rate & rhythm and no murmurs; no swelling of the legs. No tenderness.  ABDOMEN:abdomen soft, non-tender and normal bowel sounds Musculoskeletal:no cyanosis of digits and no clubbing  PSYCH: alert & oriented x 3 with fluent speech NEURO: no focal motor/sensory deficits SKIN:  no rashes or significant lesions  LABORATORY DATA:  I have reviewed the data as listed    Component Value Date/Time   NA 135 10/29/2016 1053   NA 138 09/19/2014 1118   K 4.1 10/29/2016 1053   K 4.0 09/19/2014 1118   CL 101 10/29/2016 1053   CL 103 09/19/2014 1118   CO2 28 10/29/2016 1053   CO2 29 09/19/2014 1118   GLUCOSE 95 10/29/2016 1053   GLUCOSE 91 09/19/2014 1118   BUN 21 (H) 10/29/2016 1053   BUN 15 09/19/2014 1118   CREATININE 0.75 10/29/2016 1053   CREATININE 0.69 09/19/2014 1118   CALCIUM 8.6 (L) 10/29/2016 1053   CALCIUM 9.0 09/19/2014 1118   PROT 7.1 10/29/2016 1053   PROT 6.8 09/19/2014 1118   ALBUMIN 3.4 (L) 10/29/2016 1053   ALBUMIN 3.9  09/19/2014 1118   AST 17 10/29/2016 1053   AST 20 09/19/2014 1118   ALT 12 (L) 10/29/2016 1053   ALT 16 09/19/2014 1118   ALKPHOS 79 10/29/2016 1053   ALKPHOS 85 09/19/2014 1118   BILITOT 0.6 10/29/2016 1053   BILITOT 0.6 09/19/2014 1118   GFRNONAA >60 10/29/2016 1053   GFRNONAA >60 09/19/2014 1118   GFRAA >60 10/29/2016 1053   GFRAA >60 09/19/2014 1118    No results found for: SPEP, UPEP  Lab Results  Component Value Date   WBC 5.1 10/29/2016   NEUTROABS 3.8 10/29/2016   HGB 11.8 (L) 10/29/2016   HCT 36.8 10/29/2016   MCV 90.0 10/29/2016   PLT 223 10/29/2016      Chemistry      Component Value Date/Time   NA 135 10/29/2016 1053   NA 138 09/19/2014 1118   K 4.1 10/29/2016 1053   K 4.0 09/19/2014 1118   CL 101 10/29/2016 1053   CL 103 09/19/2014 1118   CO2 28 10/29/2016 1053   CO2 29 09/19/2014 1118   BUN 21 (H) 10/29/2016 1053   BUN 15 09/19/2014 1118   CREATININE 0.75 10/29/2016 1053   CREATININE 0.69 09/19/2014 1118      Component Value Date/Time   CALCIUM 8.6 (L) 10/29/2016 1053   CALCIUM 9.0 09/19/2014 1118   ALKPHOS 79 10/29/2016 1053   ALKPHOS 85 09/19/2014 1118   AST 17 10/29/2016 1053   AST 20 09/19/2014 1118   ALT 12 (L) 10/29/2016 1053   ALT 16 09/19/2014 1118   BILITOT 0.6 10/29/2016 1053   BILITOT 0.6 09/19/2014 1118      Results for Mcquilkin, Aviva MORA (MRN [id number]) as of 11/05/2016 11:06  Ref. Range 08/07/2016 09:38 09/05/2016 10:40 10/01/2016 11:14 10/29/2016 10:53 10/29/2016 10:58  Kappa free light chain Latest Ref Range: 3.3 - 19.4 mg/L 73.1 (H) 81.6 (H) 122.0 (H) 113.3 (H)   Lamda free light chains Latest Ref Range: 5.7 - 26.3 mg/L 12.3 12.1 11.7 12.9   Kappa, lamda light chain ratio Latest Ref Range: 0.26 - 1.65  5.94 (H) 6.74 (H) 10.43 (H) 8.78 (H)   M Protein SerPl Elph-Mcnc Latest Ref Range: Not Observed g/dL 1.1 (H) 1.2 (H) 1.3 (H)  1.4 (H)       RADIOGRAPHIC STUDIES: I have personally reviewed the radiological images as listed  and agreed with the findings in the report. No results found.   ASSESSMENT & PLAN:  Multiple myeloma not having achieved remission ([facility]) Multiple myeloma relapsed currently on pomolyst 4 mg 2 weeks on 2 week off- patient seems to be tolerating well without any major side effects. However slow progression noted [C discussion below]  #  Most recent myeloma numbers in  May 2018 M protein 1.4 g [slightly elevated/rising]; kappa [113] lambda light chain ratio= elevated; up to 8.7. Biochemical progression; currently no worsening anemia [stable 10-11] or renal function. I reviewed at length with the patient and his caregiver.   # Discussed re: rising M- proteins- need to adding dara- currently no worsening end organ dysfunction. Discussed re: potential drug reactions in length; long infusion time. Risk of shingles. She will need shingles prophylaxis. Also discussed regarding premedication with- steroids/Claritin/Singulair.   # Pain control/pelvic fracture-Better. Continue fentanyl patch 25 and also using oxycodone 5 mg prn. New scripts given.   # confusion/delirium/ slow cognitive decline/history of falls -currently stable. However given anxiety/difficulty sleeping at night- recommend adding-0.25 respiridal qhs.  # History of osteonecrosis of the jaw- not on Zometa.   # Weight loss- stable/improved- currently 102 pounds. Continue Remeron.  # port flush at next visit.   # Follow-up in 5 weeks with labs/ myeloma labs/[[place] office].; port flush [ph- [id number] care giver]; follow-up with me in 1 week and then plan adding antibody therapy    Orders Placed This Encounter  Procedures  . CBC with Differential/Platelet    Standing Status:   Future    Standing Expiration Date:   11/05/2017  . Comprehensive metabolic panel    Standing Status:   Future    Standing Expiration Date:   11/05/2017  . Kappa/lambda light chains    Standing Status:   Future    Standing Expiration Date:   11/05/2017  .  Multiple Myeloma Panel (SPEP&IFE w/QIG)    Standing Status:   Future    Standing Expiration Date:   11/05/2017   All questions were answered. The patient knows to call the clinic with any problems, questions or concerns.      Cammie Sickle, MD 11/05/2016 12:07 PM

## 2016-11-05 NOTE — Progress Notes (Signed)
Pt and daughter reports frequent falls at pt's residence. Pt states, "I often do not tell my family that I fall because then the guard my condition more closely and will not leave my house." pt has unstable gait today. RN encouraged use of w/c while in clinic. Pt refuses w/c. pt did not bring her walker in clinic today stated that the walker in the car. "She refused to use it today" per daughter.  RN provided pt education on fall prevention.  Pt states that she is unable to sleep at bedtime. Daughter states that she would like to discuss the pt's use of melatonin supplement at bedtime in addition to the use of remeron.  Pt reports "seeing people walking around my home randomly and in the woods. They whistle at each other. I am sometimes bothered by this and I wake up at night." per daughter, "this is not correct. There is no one walking around at her home. She is seeing these things. My brother has performed an environmental surveillance of the home and there is no evidence of anyone bothering her."  Pt states, "Sometimes I wake up in the middle of the night and just lie on the floor to try to get some sleep and this doesn't help."

## 2016-11-06 ENCOUNTER — Telehealth: Payer: Self-pay | Admitting: *Deleted

## 2016-11-06 NOTE — Telephone Encounter (Signed)
Penny Rowan- please inform family that I would defer to PCP for management for her mood distrurbances.

## 2016-11-06 NOTE — Telephone Encounter (Addendum)
Attitude getting more abrasive. Asking if there is a mood stabilizer because she goes from being timid and nice to very abrasive. Please advise

## 2016-11-07 NOTE — Telephone Encounter (Signed)
Left message on [name]'s vm to contact PCP for mood disturbances

## 2016-11-18 ENCOUNTER — Other Ambulatory Visit: Payer: Self-pay | Admitting: *Deleted

## 2016-11-18 ENCOUNTER — Telehealth: Payer: Self-pay | Admitting: *Deleted

## 2016-11-18 DIAGNOSIS — C9002 Multiple myeloma in relapse: Secondary | ICD-10-CM

## 2016-11-18 MED ORDER — POMALIDOMIDE 4 MG PO CAPS: ORAL_CAPSULE | ORAL | 0 refills | Status: DC

## 2016-11-18 NOTE — Telephone Encounter (Signed)
Received rx renewal request from [facility] for Pomalyst 4 mg capsules.  Spoke with Dr. Janese Banks, who agreed to renew rx for patient (in Dr. Aletha Halim absence).  Rems survey performedJosem [name] -[phone].

## 2016-12-02 ENCOUNTER — Other Ambulatory Visit: Payer: Self-pay | Admitting: *Deleted

## 2016-12-02 DIAGNOSIS — C9000 Multiple myeloma not having achieved remission: Secondary | ICD-10-CM

## 2016-12-02 DIAGNOSIS — R451 Restlessness and agitation: Secondary | ICD-10-CM

## 2016-12-02 MED ORDER — FENTANYL 25 MCG/HR TD PT72: 25.0000 ug | MEDICATED_PATCH | TRANSDERMAL | 0 refills | Status: DC

## 2016-12-02 MED ORDER — RISPERIDONE 0.25 MG PO TABS: 0.5000 mg | ORAL_TABLET | Freq: Every day | ORAL | 3 refills | Status: DC

## 2016-12-02 NOTE — Telephone Encounter (Signed)
Family requesting that the Respiridone dose be increased, she is still having problems not sleeping and insisting there are people outside her window or under her house at night is is getting up during the night after they leave. Also needs Fentanyl patch refilled to be picked up in [place] tomorrow when she comes in for labs. Please advise

## 2016-12-02 NOTE — Telephone Encounter (Signed)
Called and left vm for patient's daughter - asked daughter to return my phone call. MD sent new rx - for Respiridone 0.5 mg at bedtime.

## 2016-12-03 ENCOUNTER — Inpatient Hospital Stay: Payer: Medicare Other

## 2016-12-03 ENCOUNTER — Inpatient Hospital Stay: Payer: Medicare Other | Attending: Internal Medicine

## 2016-12-03 DIAGNOSIS — C9002 Multiple myeloma in relapse: Secondary | ICD-10-CM | POA: Insufficient documentation

## 2016-12-03 DIAGNOSIS — Z95828 Presence of other vascular implants and grafts: Secondary | ICD-10-CM

## 2016-12-03 DIAGNOSIS — C9000 Multiple myeloma not having achieved remission: Secondary | ICD-10-CM

## 2016-12-03 LAB — CBC WITH DIFFERENTIAL/PLATELET
Basophils Absolute: 0 10*3/uL (ref 0–0.1)
Basophils Relative: 1 %
EOS ABS: 0.1 10*3/uL (ref 0–0.7)
Eosinophils Relative: 3 %
HCT: 32.5 % — ABNORMAL LOW (ref 35.0–47.0)
HEMOGLOBIN: 10.6 g/dL — AB (ref 12.0–16.0)
LYMPHS ABS: 0.7 10*3/uL — AB (ref 1.0–3.6)
LYMPHS PCT: 23 %
MCH: 29.2 pg (ref 26.0–34.0)
MCHC: 32.7 g/dL (ref 32.0–36.0)
MCV: 89.3 fL (ref 80.0–100.0)
Monocytes Absolute: 0.2 10*3/uL (ref 0.2–0.9)
Monocytes Relative: 6 %
NEUTROS ABS: 2 10*3/uL (ref 1.4–6.5)
NEUTROS PCT: 67 %
Platelets: 181 10*3/uL (ref 150–440)
RBC: 3.64 MIL/uL — AB (ref 3.80–5.20)
RDW: 16.2 % — ABNORMAL HIGH (ref 11.5–14.5)
WBC: 3 10*3/uL — AB (ref 3.6–11.0)

## 2016-12-03 LAB — COMPREHENSIVE METABOLIC PANEL
ALK PHOS: 72 U/L (ref 38–126)
ALT: 10 U/L — AB (ref 14–54)
AST: 16 U/L (ref 15–41)
Albumin: 3.1 g/dL — ABNORMAL LOW (ref 3.5–5.0)
Anion gap: 8 (ref 5–15)
BUN: 17 mg/dL (ref 6–20)
CALCIUM: 8.5 mg/dL — AB (ref 8.9–10.3)
CO2: 26 mmol/L (ref 22–32)
CREATININE: 0.71 mg/dL (ref 0.44–1.00)
Chloride: 101 mmol/L (ref 101–111)
GFR calc non Af Amer: >60 mL/min (ref >60)
GLUCOSE: 96 mg/dL (ref 65–99)
Potassium: 3.7 mmol/L (ref 3.5–5.1)
SODIUM: 135 mmol/L (ref 135–145)
Total Bilirubin: 0.5 mg/dL (ref 0.3–1.2)
Total Protein: 6.9 g/dL (ref 6.5–8.1)

## 2016-12-03 MED ORDER — HEPARIN SOD (PORK) LOCK FLUSH 100 UNIT/ML IV SOLN
500.0000 [IU] | Freq: Once | INTRAVENOUS | Status: AC
  Administered 2016-12-03: 500 [IU] via INTRAVENOUS
  Filled 2016-12-03: qty 5

## 2016-12-03 MED ORDER — SODIUM CHLORIDE 0.9% FLUSH
10.0000 mL | INTRAVENOUS | Status: DC | PRN
  Administered 2016-12-03: 10 mL via INTRAVENOUS
  Filled 2016-12-03: qty 10

## 2016-12-04 LAB — KAPPA/LAMBDA LIGHT CHAINS
KAPPA FREE LGHT CHN: 110.6 mg/L — AB (ref 3.3–19.4)
KAPPA, LAMDA LIGHT CHAIN RATIO: 9.53 — AB (ref 0.26–1.65)
Lambda free light chains: 11.6 mg/L (ref 5.7–26.3)

## 2016-12-05 LAB — MULTIPLE MYELOMA PANEL, SERUM
ALBUMIN SERPL ELPH-MCNC: 3 g/dL (ref 2.9–4.4)
ALPHA 1: 0.2 g/dL (ref 0.0–0.4)
ALPHA2 GLOB SERPL ELPH-MCNC: 0.6 g/dL (ref 0.4–1.0)
Albumin/Glob SerPl: 0.9 (ref 0.7–1.7)
B-GLOBULIN SERPL ELPH-MCNC: 0.9 g/dL (ref 0.7–1.3)
Gamma Glob SerPl Elph-Mcnc: 1.8 g/dL (ref 0.4–1.8)
Globulin, Total: 3.5 g/dL (ref 2.2–3.9)
IGG (IMMUNOGLOBIN G), SERUM: 1654 mg/dL — AB (ref 700–1600)
IgA: 59 mg/dL — ABNORMAL LOW (ref 64–422)
IgM, Serum: 52 mg/dL (ref 26–217)
M PROTEIN SERPL ELPH-MCNC: 1.6 g/dL — AB
TOTAL PROTEIN ELP: 6.5 g/dL (ref 6.0–8.5)

## 2016-12-10 ENCOUNTER — Inpatient Hospital Stay: Payer: Medicare Other | Attending: Internal Medicine | Admitting: Internal Medicine

## 2016-12-10 VITALS — BP 130/86 | HR 76 | Temp 97.9°F | Resp 16 | Ht 63.0 in | Wt 103.2 lb

## 2016-12-10 DIAGNOSIS — E785 Hyperlipidemia, unspecified: Secondary | ICD-10-CM | POA: Diagnosis not present

## 2016-12-10 DIAGNOSIS — R634 Abnormal weight loss: Secondary | ICD-10-CM | POA: Diagnosis not present

## 2016-12-10 DIAGNOSIS — M25551 Pain in right hip: Secondary | ICD-10-CM | POA: Diagnosis not present

## 2016-12-10 DIAGNOSIS — G4700 Insomnia, unspecified: Secondary | ICD-10-CM | POA: Insufficient documentation

## 2016-12-10 DIAGNOSIS — Z809 Family history of malignant neoplasm, unspecified: Secondary | ICD-10-CM | POA: Insufficient documentation

## 2016-12-10 DIAGNOSIS — I10 Essential (primary) hypertension: Secondary | ICD-10-CM | POA: Diagnosis not present

## 2016-12-10 DIAGNOSIS — F0390 Unspecified dementia without behavioral disturbance: Secondary | ICD-10-CM | POA: Insufficient documentation

## 2016-12-10 DIAGNOSIS — I2510 Atherosclerotic heart disease of native coronary artery without angina pectoris: Secondary | ICD-10-CM | POA: Insufficient documentation

## 2016-12-10 DIAGNOSIS — Z923 Personal history of irradiation: Secondary | ICD-10-CM

## 2016-12-10 DIAGNOSIS — C9002 Multiple myeloma in relapse: Secondary | ICD-10-CM | POA: Insufficient documentation

## 2016-12-10 DIAGNOSIS — I4891 Unspecified atrial fibrillation: Secondary | ICD-10-CM | POA: Diagnosis not present

## 2016-12-10 DIAGNOSIS — R7989 Other specified abnormal findings of blood chemistry: Secondary | ICD-10-CM | POA: Diagnosis not present

## 2016-12-10 DIAGNOSIS — F419 Anxiety disorder, unspecified: Secondary | ICD-10-CM | POA: Diagnosis not present

## 2016-12-10 DIAGNOSIS — C9000 Multiple myeloma not having achieved remission: Secondary | ICD-10-CM

## 2016-12-10 DIAGNOSIS — Z8739 Personal history of other diseases of the musculoskeletal system and connective tissue: Secondary | ICD-10-CM | POA: Insufficient documentation

## 2016-12-10 DIAGNOSIS — Z79899 Other long term (current) drug therapy: Secondary | ICD-10-CM

## 2016-12-10 MED ORDER — DEXAMETHASONE 4 MG PO TABS: 4.0000 mg | ORAL_TABLET | Freq: Two times a day (BID) | ORAL | 3 refills | Status: DC

## 2016-12-10 MED ORDER — ACYCLOVIR 400 MG PO TABS: ORAL_TABLET | ORAL | 3 refills | Status: DC

## 2016-12-10 MED ORDER — MONTELUKAST SODIUM 10 MG PO TABS: 10.0000 mg | ORAL_TABLET | Freq: Every day | ORAL | 3 refills | Status: DC

## 2016-12-10 NOTE — Progress Notes (Signed)
START ON PATHWAY REGIMEN - Multiple Myeloma     A cycle is every 28 days:     Daratumumab      Pomalidomide      Dexamethasone      Dexamethasone   **Always confirm dose/schedule in your pharmacy ordering system**    Patient Characteristics: Relapsed / Refractory, All Lines of Therapy R-ISS Staging: III Disease Classification: Relapsed Line of Therapy: Third Line Intent of Therapy: Non-Curative / Palliative Intent, Discussed with Patient

## 2016-12-10 NOTE — Progress Notes (Signed)
[place] OFFICE PROGRESS NOTE  Patient Care Team: Derinda Late, MD as PCP - General (Family Medicine) Forest Gleason, MD (Oncology) Christene Lye, MD (General Surgery)  No matching staging information was found for the patient.   Oncology History   1. Stage III Multiple Myeloma a. 11/20/09: SPEP showed quantitative IgG 4567 mg/dL with M spike of 3.8 g/dL and IFE showing IgG monoclonal protein with light chain specificity.  [place] urine protein electrophoresis showed M spike 27% Bence Jones Protein.  a. 11/28/09: bone marrow biopsy: - PLASMA CELL DYSCRASIA CONSISTENT WITH MULTIPLE MYELOMA (55%PLASMA CELLS ON ASPIRATE, 75% BY CD138 IMMUNOHISTOCHEMISTRY,KAPPA RESTRICTED).TRILINEAGE HEMATOPOIESIS.46 XX,  Normal FISH panel. b. 11/29/09: bone survey: There are numerous, scattered 2 mm to 5 mm concentric  radiolucencies in the bony calvaria.   Two or three similar focal lucent areas in the right  femur.  There are observed changes consistent with disc disease at multiple levels of the cervical spine and multiple levels of the lumbar spine. c. 12/07/09: right humerus: Multiple tiny lucencies noted throughout the right humerus including humeral diaphysis. No fracture. d. 8/11- 4/12 Mephalan/prednisone/velcade X 6 cycles with decrease in Mspike to 1.0 gm/dl. e.osteonecrosis of jaw and diagnosed in December of 2013(Zometa was discontinued) 7.Patient was started on Velcade and Decadron   because of progressive multiple myeloma from January of 2013. 8. Started on Kyprolis 05/13 9.progressing disease on kyphorilis September of 2013 10 patient has been started on [name] daily for 21 days and one week off Decadron day1, 18 and 15   with 4 week cycle is finished palliative radiation therapy without much relief in pain(November, 2013) 11.Palliative radiation therapy to the right shoulder (December, 2014) 12 patient was on break from pomolidomide because of infection and it has  been presumed from December 05, 2014  # July 2017- skeletal survey- stable bone lesions.      Multiple myeloma not having achieved remission ([place])   11/20/2009 Initial Diagnosis    Multiple myeloma in relapse        INTERVAL HISTORY: Patient is a fair historian at best.  [name] 83 y.o.  female pleasant patient With mild-moderate dementia and above history of Multiple myeloma currently on pomolyst is here for follow-up/Accompanied by a caregiver/daughter-in-law.  As per the family patient had been complaining of worsening pain in her right hip [name] is different from her previous pelvic pain post fracture]. She has been on fentanyl patch and oxycodone which she has been taking more frequently at this time  Patient has had increasing Anxiety spells and also difficulty sleeping at night.  Her dose of Risperdal has been increased recently.  No nausea no vomiting. No skin rash. No diarrhea. No chest pain or shortness of breath or cough. Denies any tingling or numbness.   REVIEW OF SYSTEMS:  A complete 10 point review of system is done which is negative except mentioned above/history of present illness.   PAST MEDICAL HISTORY :  Past Medical History:  Diagnosis Date  . Anxiety   . Arthritis   . Atrial fibrillation ([place])   . Coronary arteriosclerosis   . History of blood transfusion 2014  . Hyperlipidemia   . Hypertension   . Multiple myeloma ([place])   . Multiple myeloma in relapse ([place]) 10/29/2014  . Osteonecrosis of jaw ([place])    left    PAST SURGICAL HISTORY :   Past Surgical History:  Procedure Laterality Date  . ABDOMINAL HYSTERECTOMY    . APPENDECTOMY    .  PARTIAL COLECTOMY    . TOTAL KNEE REVISION Left     FAMILY HISTORY :   Family History  Problem Relation Age of Onset  . Cancer Father   . Heart attack Mother     SOCIAL HISTORY:   Social History  Substance Use Topics  . Smoking status: Never Smoker  . Smokeless tobacco: Never Used  . Alcohol use 1.2  oz/week    2 Glasses of wine per week    ALLERGIES:  is allergic to biaxin [clarithromycin]; demerol [meperidine]; hydrocodone; sulfa antibiotics; tramadol; and ultracet [tramadol-acetaminophen].  MEDICATIONS:  Current Outpatient Prescriptions  Medication Sig Dispense Refill  . acetaminophen (TYLENOL 8 HOUR) 650 MG CR tablet Take 650 mg by mouth. 1 tablet twice a day x 4 days. Start taking 2 days prior to [facility] treatment, on the day of treatment and day after treatment.    . Biotin 1 MG CAPS Take 1 capsule by mouth daily.     . Calcium-Magnesium-Vitamin D (CALCIUM 500 PO) Take 1 tablet by mouth daily.     [name] Sodium (COLACE PO) Take 2 tablets by mouth 2 (two) times daily.    . fentaNYL (DURAGESIC - DOSED MCG/HR) 25 MCG/HR patch Place 1 patch (25 mcg total) onto the skin every 3 (three) days. 10 patch 0  . ferrous sulfate 325 (65 FE) MG EC tablet Take 325 mg by mouth daily.    . mirtazapine (REMERON) 7.5 MG tablet Take 30 mg by mouth at bedtime.     . Multiple Vitamins-Minerals (CENTRUM SILVER 50+WOMEN PO) Take 1 tablet by mouth daily.     Marland Kitchen oxyCODONE (OXY IR/ROXICODONE) 5 MG immediate release tablet Take 0.5 tablets (2.5 mg total) by mouth every 8 (eight) hours as needed for severe pain. 30 tablet 0  . polyethylene glycol (MIRALAX / GLYCOLAX) packet Take 17 g by mouth daily as needed for mild constipation.    . pomalidomide (POMALYST) 4 MG capsule TAKE 1 CAPSULE BY MOUTH DAILY FOR 14 DAYS ON FOLLOWED BY 14 DAYS OFF. 14 capsule 0  . ranitidine (ZANTAC) 150 MG capsule Take 150 mg by mouth 2 (two) times daily. 1 tablet twice a day x 4 days. Start taking 2 days prior to [facility] treatment, on the day of treatment and day after treatment.    . risperiDONE (RISPERDAL) 0.25 MG tablet Take 2 tablets (0.5 mg total) by mouth at bedtime. 60 tablet 3  . acyclovir (ZOVIRAX) 400 MG tablet One pill a day [to prevent shingles] 30 tablet 3  . dexamethasone (DECADRON) 4 MG tablet Take 1 tablet (4 mg  total) by mouth 2 (two) times daily. Start 2 days prior to infusion; Take it for 4 days. 60 tablet 3  . loratadine (CLARITIN) 10 MG tablet Take 10 mg by mouth. 1 tablet a day x 4 days. Start taking 2 days prior to [facility] treatment, on the day of treatment and day after treatment.    . montelukast (SINGULAIR) 10 MG tablet Take 1 tablet (10 mg total) by mouth at bedtime. Start 2 days prior to infusion. Take it for 4 days. 60 tablet 3  . ondansetron (ZOFRAN) 4 MG tablet Take 1 tablet (4 mg total) by mouth every 8 (eight) hours as needed for nausea or vomiting. (Patient not taking: Reported on 10/01/2016) 20 tablet 0  . prochlorperazine (COMPAZINE) 10 MG tablet Take 1 tablet (10 mg total) by mouth every 8 (eight) hours as needed for nausea or vomiting. (Patient not taking: Reported on  10/01/2016) 30 tablet 1   No current facility-administered medications for this visit.    Facility-Administered Medications Ordered in Other Visits  Medication Dose Route Frequency Provider Last Rate Last Dose  . sodium chloride 0.9 % injection 10 mL  10 mL Intravenous PRN Forest Gleason, MD   10 mL at 03/01/15 1305    PHYSICAL EXAMINATION: ECOG PERFORMANCE STATUS: 0 - Asymptomatic  BP 130/86 (BP Location: Left Arm, Patient Position: Sitting)   Pulse 76   Temp 97.9 F (36.6 C) (Tympanic)   Resp 16   Ht '5\' 3"'  (1.6 m)   Wt 103 lb 2.8 oz (46.8 kg)   BMI 18.28 kg/m   Filed Weights   12/10/16 1114  Weight: 103 lb 2.8 oz (46.8 kg)    GENERAL: frail-appearing elderly female patiett Alert, no distress and comfortable. Accompanied by her care giver/ daughter-in-law. Patient is walking. EYES: no pallor or icterus OROPHARYNX: no thrush or ulceration; good dentition  NECK: supple, no masses felt LYMPH:  no palpable lymphadenopathy in the cervical, axillary or inguinal regions LUNGS: clear to auscultation and  No wheeze or crackles HEART/CVS: regular rate & rhythm and no murmurs; no swelling of the legs. No  tenderness.  ABDOMEN:abdomen soft, non-tender and normal bowel sounds Musculoskeletal:no cyanosis of digits and no clubbing  PSYCH: alert & oriented x 3 with fluent speech NEURO: no focal motor/sensory deficits SKIN:  no rashes or significant lesions  LABORATORY DATA:  I have reviewed the data as listed    Component Value Date/Time   NA 135 12/03/2016 1054   NA 138 09/19/2014 1118   K 3.7 12/03/2016 1054   K 4.0 09/19/2014 1118   CL 101 12/03/2016 1054   CL 103 09/19/2014 1118   CO2 26 12/03/2016 1054   CO2 29 09/19/2014 1118   GLUCOSE 96 12/03/2016 1054   GLUCOSE 91 09/19/2014 1118   BUN 17 12/03/2016 1054   BUN 15 09/19/2014 1118   CREATININE 0.71 12/03/2016 1054   CREATININE 0.69 09/19/2014 1118   CALCIUM 8.5 (L) 12/03/2016 1054   CALCIUM 9.0 09/19/2014 1118   PROT 6.9 12/03/2016 1054   PROT 6.8 09/19/2014 1118   ALBUMIN 3.1 (L) 12/03/2016 1054   ALBUMIN 3.9 09/19/2014 1118   AST 16 12/03/2016 1054   AST 20 09/19/2014 1118   ALT 10 (L) 12/03/2016 1054   ALT 16 09/19/2014 1118   ALKPHOS 72 12/03/2016 1054   ALKPHOS 85 09/19/2014 1118   BILITOT 0.5 12/03/2016 1054   BILITOT 0.6 09/19/2014 1118   GFRNONAA >60 12/03/2016 1054   GFRNONAA >60 09/19/2014 1118   GFRAA >60 12/03/2016 1054   GFRAA >60 09/19/2014 1118    No results found for: SPEP, UPEP  Lab Results  Component Value Date   WBC 3.0 (L) 12/03/2016   NEUTROABS 2.0 12/03/2016   HGB 10.6 (L) 12/03/2016   HCT 32.5 (L) 12/03/2016   MCV 89.3 12/03/2016   PLT 181 12/03/2016      Chemistry      Component Value Date/Time   NA 135 12/03/2016 1054   NA 138 09/19/2014 1118   K 3.7 12/03/2016 1054   K 4.0 09/19/2014 1118   CL 101 12/03/2016 1054   CL 103 09/19/2014 1118   CO2 26 12/03/2016 1054   CO2 29 09/19/2014 1118   BUN 17 12/03/2016 1054   BUN 15 09/19/2014 1118   CREATININE 0.71 12/03/2016 1054   CREATININE 0.69 09/19/2014 1118      Component Value Date/Time  CALCIUM 8.5 (L) 12/03/2016 1054    CALCIUM 9.0 09/19/2014 1118   ALKPHOS 72 12/03/2016 1054   ALKPHOS 85 09/19/2014 1118   AST 16 12/03/2016 1054   AST 20 09/19/2014 1118   ALT 10 (L) 12/03/2016 1054   ALT 16 09/19/2014 1118   BILITOT 0.5 12/03/2016 1054   BILITOT 0.6 09/19/2014 1118      Results for Barnwell, [name] (MRN [id number]) as of 11/05/2016 11:06  Ref. Range 08/07/2016 09:38 09/05/2016 10:40 10/01/2016 11:14 10/29/2016 10:53 10/29/2016 10:58  Kappa free light chain Latest Ref Range: 3.3 - 19.4 mg/L 73.1 (H) 81.6 (H) 122.0 (H) 113.3 (H)   Lamda free light chains Latest Ref Range: 5.7 - 26.3 mg/L 12.3 12.1 11.7 12.9   Kappa, lamda light chain ratio Latest Ref Range: 0.26 - 1.65  5.94 (H) 6.74 (H) 10.43 (H) 8.78 (H)   M Protein SerPl Elph-Mcnc Latest Ref Range: Not Observed g/dL 1.1 (H) 1.2 (H) 1.3 (H)  1.4 (H)       RADIOGRAPHIC STUDIES: I have personally reviewed the radiological images as listed and agreed with the findings in the report. No results found.   ASSESSMENT & PLAN:  Multiple myeloma not having achieved remission (H[facility]) Multiple myeloma relapsed currently on pomolyst 4 mg 2 weeks on 2 week off- patient seems to be tolerating well without any major side effects. However slow progression noted [C discussion below]  #  Most recent myeloma numbers in  June  2018 M protein 1.6 g [slightly elevated/rising]; kappa [113] lambda light chain ratio= elevated; up to 8.7. Biochemical progression; currently with worsening anemia [stable 10]; stable- renal function. I reviewed at length with the patient and his caregiver.   # Discussed re: rising M- proteins- need to adding dara- currently no worsening end organ dysfunction. Discussed re: potential drug reactions in length; long infusion time. Risk of shingles. She will need shingles prophylaxis. Also discussed regarding premedication with- steroids/Claritin/Singulair/tylenol/zantac.   # Right hip pain- ? Progression of Myeloma; recommend whole body PET.   Continue fentanyl patch 25 and also using oxycodone 5 mg prn.   # confusion/delirium/ slow cognitive decline/history of falls -currently stable. However given anxiety/difficulty sleeping at night- currently on 0.5 mg respiridal qhs.  # History of osteonecrosis of the jaw- not on Zometa.   # Weight loss- stable/improved- currently 102 pounds. Continue Remeron.  # follow up with me in appx 1 week/dara infusion/ PET scan prior.   Orders Placed This Encounter  Procedures  . NM PET Image Initial (PI) Whole Body    Standing Status:   Future    Standing Expiration Date:   02/10/2018    Order Specific Question:   Reason for Exam (SYMPTOM  OR DIAGNOSIS REQUIRED)    Answer:   multiple myeloma    Order Specific Question:   If indicated for the ordered procedure, I authorize the administration of a radiopharmaceutical per Radiology protocol    Answer:   Yes    Order Specific Question:   Preferred imaging location?    Answer:   [facility]    Order Specific Question:   Radiology Contrast Protocol - do NOT remove file path    Answer:   [email or web address]   All questions were answered. The patient knows to call the clinic with any problems, questions or concerns.      Cammie Sickle, MD 12/10/2016 6:14 PM

## 2016-12-10 NOTE — Patient Instructions (Signed)
Daratumumab injection What is this medicine? DARATUMUMAB (dar a toom ue mab) is a monoclonal antibody. It is used to treat multiple myeloma. This medicine may be used for other purposes; ask your health care provider or pharmacist if you have questions. COMMON BRAND NAME(S): DARZALEX What should I tell my health care provider before I take this medicine? They need to know if you have any of these conditions: -infection (especially a virus infection such as chickenpox, cold sores, or herpes) -lung or breathing disease -pregnant or trying to get pregnant -breast-feeding -an unusual or allergic reaction to daratumumab, other medicines, foods, dyes, or preservatives How should I use this medicine? This medicine is for infusion into a vein. It is given by a health care professional in a hospital or clinic setting. Talk to your pediatrician regarding the use of this medicine in children. Special care may be needed. Overdosage: If you think you have taken too much of this medicine contact a poison control center or emergency room at once. NOTE: This medicine is only for you. Do not share this medicine with others. What if I miss a dose? Keep appointments for follow-up doses as directed. It is important not to miss your dose. Call your doctor or health care professional if you are unable to keep an appointment. What may interact with this medicine? Interactions have not been studied. Give your health care provider a list of all the medicines, herbs, non-prescription drugs, or dietary supplements you use. Also tell them if you smoke, drink alcohol, or use illegal drugs. Some items may interact with your medicine. This list may not describe all possible interactions. Give your health care provider a list of all the medicines, herbs, non-prescription drugs, or dietary supplements you use. Also tell them if you smoke, drink alcohol, or use illegal drugs. Some items may interact with your medicine. What  should I watch for while using this medicine? This drug may make you feel generally unwell. Report any side effects. Continue your course of treatment even though you feel ill unless your doctor tells you to stop. This medicine can cause serious allergic reactions. To reduce your risk you may need to take medicine before treatment with this medicine. Take your medicine as directed. This medicine can affect the results of blood tests to match your blood type. These changes can last for up to 6 months after the final dose. Your healthcare provider will do blood tests to match your blood type before you start treatment. Tell all of your healthcare providers that you are being treated with this medicine before receiving a blood transfusion. This medicine can affect the results of some tests used to determine treatment response; extra tests may be needed to evaluate response. Do not become pregnant while taking this medicine or for 3 months after stopping it. Women should inform their doctor if they wish to become pregnant or think they might be pregnant. There is a potential for serious side effects to an unborn child. Talk to your health care professional or pharmacist for more information. What side effects may I notice from receiving this medicine? Side effects that you should report to your doctor or health care professional as soon as possible: -allergic reactions like skin rash, itching or hives, swelling of the face, lips, or tongue -breathing problems -chills -cough -dizziness -feeling faint or lightheaded -headache -low blood counts - this medicine may decrease the number of white blood cells, red blood cells and platelets. You may be at increased risk  for infections and bleeding. -nausea, vomiting -shortness of breath -signs of decreased platelets or bleeding - bruising, pinpoint red spots on the skin, black, tarry stools, blood in the urine -signs of decreased red blood cells - unusually  weak or tired, feeling faint or lightheaded, falls -signs of infection - fever or chills, cough, sore throat, pain or difficulty passing urine Side effects that usually do not require medical attention (report to your doctor or health care professional if they continue or are bothersome): -back pain -diarrhea -muscle cramps -pain, tingling, numbness in the hands or feet -swelling of the ankles, feet, hands -tiredness This list may not describe all possible side effects. Call your doctor for medical advice about side effects. You may report side effects to FDA at 1-800-FDA-1088. Where should I keep my medicine? Keep out of the reach of children. This drug is given in a hospital or clinic and will not be stored at home. NOTE: This sheet is a summary. It may not cover all possible information. If you have questions about this medicine, talk to your doctor, pharmacist, or health care provider.  2018 Elsevier/Gold Standard (2015-06-29 10:38:11)  

## 2016-12-10 NOTE — Progress Notes (Signed)
Per grand-daughter- pt has not missed any dosing of the Pomalyst. Yesterday was pt's last dose. Today starts pt's week 1 being off pomalyst. Pt report right sided hip pain-chronic. Made worse with movement.  Pt reports decrease appetite. eats 1 large sandwich generally at lunch and ensure/bloost at dinner. Does not eat any breakfast.   Spoke with pt, pt's daughter and pt's son regarding premeds instructions prior to tx. Goals of care discussed with pt and pt's family.

## 2016-12-10 NOTE — Assessment & Plan Note (Addendum)
Multiple myeloma relapsed currently on pomolyst 4 mg 2 weeks on 2 week off- patient seems to be tolerating well without any major side effects. However slow progression noted [C discussion below]  #  Most recent myeloma numbers in  June  2018 M protein 1.6 g [slightly elevated/rising]; kappa [113] lambda light chain ratio= elevated; up to 8.7. Biochemical progression; currently with worsening anemia [stable 10]; stable- renal function. I reviewed at length with the patient and his caregiver.   # Discussed re: rising M- proteins- need to adding dara- currently no worsening end organ dysfunction. Discussed re: potential drug reactions in length; long infusion time. Risk of shingles. She will need shingles prophylaxis. Also discussed regarding premedication with- steroids/Claritin/Singulair/tylenol/zantac.   # Right hip pain- ? Progression of Myeloma; recommend whole body PET.  Continue fentanyl patch 25 and also using oxycodone 5 mg prn.   # confusion/delirium/ slow cognitive decline/history of falls -currently stable. However given anxiety/difficulty sleeping at night- currently on 0.5 mg respiridal qhs.  # History of osteonecrosis of the jaw- not on Zometa.   # Weight loss- stable/improved- currently 102 pounds. Continue Remeron.  # follow up with me in appx 1 week/dara infusion/ PET scan prior.

## 2016-12-12 ENCOUNTER — Telehealth: Payer: Self-pay | Admitting: *Deleted

## 2016-12-12 ENCOUNTER — Other Ambulatory Visit: Payer: Self-pay | Admitting: *Deleted

## 2016-12-12 DIAGNOSIS — C9002 Multiple myeloma in relapse: Secondary | ICD-10-CM

## 2016-12-12 MED ORDER — POMALIDOMIDE 4 MG PO CAPS
ORAL_CAPSULE | ORAL | 0 refills | Status: DC
Start: 2016-12-12 — End: 2017-01-09

## 2016-12-12 NOTE — Telephone Encounter (Signed)
[name] wants to cancel the PET scan for tomorrow, wants to see if she is still having pain in her hip in a  Month then he will agree to having PET. The 20 % part of her payment will be $300 and this pain is nothing new or abnormal. It comes and goes and has for a very long time

## 2016-12-12 NOTE — Telephone Encounter (Signed)
Asking if the Pomalyst is to start with the new infusion. She is currently out of Pomalyst and needs a refill sent to pharmacy to be delivered before the 16th which will be day 14. Please advise

## 2016-12-12 NOTE — Telephone Encounter (Signed)
Returned phone call to duaghter- Cyrstal. Left vm. Pt will be taking the pomalyst and Daratumumab concurrently.  New rx renewal submitted to [facility]. Start date will be 7/16 for pomalyst. Rems # [phone]

## 2016-12-12 NOTE — Telephone Encounter (Signed)
Spoke with Merry Proud- Explained to [place] that md would like to proceed with pet scan as pt has not had a staging pet scan for multiple myeloma. Patient's son stated that his mom had not had any hip pain since the apt on Tuesday with Dr. Jacinto Reap. I explained to son that I just spoke to cyrstal regarding his mother's care. The same msg was provided to her as well that Dr. B would like pt to keep the scan as scheduled. He agreed and stated that he thought the pet scan was just to f/u on the hip pain. I explained that the test was not just being ordered to focus on the hip pain, it was to ensure that the patient did not have any risk for pathologic fractures/disease progression.    Merry Proud agreed to proceed with pet scan as scheduled.

## 2016-12-12 NOTE — Telephone Encounter (Signed)
Per Hassan Rowan in [place] - patient daughter and pt's son Merry Proud also called to ask "Nira Conn more questions about their mom's care."  Call returned to Cyrstal. She wanted to make sure that the pomalyst and daratumumab would be taking concurrently. Pomalyst daily x 14 days and the daratumumab once weekly. She stated she wanted to make sure she understood the plan of care. I explained to her that she is correct.  I asked that to make things easier for communication purposes to family, to have 1 point person to communicate phone calls back and forth. [name] will speak to her brother Merry Proud regarding the plan of care. She states that Merry Proud was requesting that the pet scan be cnl at the last min. "Merry Proud didn't feel that the test was "medically necessary as mother did not complain of hip pain after she left the doctor's office. I encouraged him to let mom keep this appointment and told him that Dr. B felt that this was necessary." I explained that the patient needed to keep the apt as the doctor was trying to determine the stage of the patient's multiple myeloma and to determine if there are any pathologic tumors in hip.  [name] stated that she would communicate this to [place] and encourage him to keep this appointment. Also discussed that cnl pet scan at the last min. Is highly discouraged as the cost of the nuc medication is high. The nuc med dose is calculated based on pt's weight.  She gave verbal understanding.

## 2016-12-13 ENCOUNTER — Ambulatory Visit: Payer: Medicare Other

## 2016-12-13 ENCOUNTER — Encounter
Admission: RE | Admit: 2016-12-13 | Discharge: 2016-12-13 | Disposition: A | Discharge status: Home / Self Care | Payer: Medicare Other | Source: Ambulatory Visit | Attending: Internal Medicine | Admitting: Internal Medicine

## 2016-12-13 DIAGNOSIS — C9000 Multiple myeloma not having achieved remission: Secondary | ICD-10-CM | POA: Diagnosis present

## 2016-12-13 LAB — GLUCOSE, CAPILLARY: GLUCOSE-CAPILLARY: 92 mg/dL (ref 65–99)

## 2016-12-13 MED ORDER — FLUDEOXYGLUCOSE F - 18 (FDG) INJECTION
12.0000 | Freq: Once | INTRAVENOUS | Status: AC | PRN
  Administered 2016-12-13: 12.146 via INTRAVENOUS

## 2016-12-20 ENCOUNTER — Other Ambulatory Visit: Payer: Self-pay | Admitting: *Deleted

## 2016-12-20 DIAGNOSIS — C9000 Multiple myeloma not having achieved remission: Secondary | ICD-10-CM

## 2016-12-23 ENCOUNTER — Inpatient Hospital Stay: Payer: Medicare Other | Attending: Internal Medicine | Admitting: Internal Medicine

## 2016-12-23 ENCOUNTER — Inpatient Hospital Stay: Payer: Medicare Other

## 2016-12-23 VITALS — BP 172/78 | HR 71 | Temp 96.9°F | Resp 18 | Ht 63.0 in | Wt 101.0 lb

## 2016-12-23 VITALS — BP 153/78 | HR 76 | Resp 20

## 2016-12-23 DIAGNOSIS — Z923 Personal history of irradiation: Secondary | ICD-10-CM | POA: Diagnosis not present

## 2016-12-23 DIAGNOSIS — Z79899 Other long term (current) drug therapy: Secondary | ICD-10-CM | POA: Insufficient documentation

## 2016-12-23 DIAGNOSIS — Z5112 Encounter for antineoplastic immunotherapy: Secondary | ICD-10-CM | POA: Diagnosis not present

## 2016-12-23 DIAGNOSIS — C9000 Multiple myeloma not having achieved remission: Secondary | ICD-10-CM

## 2016-12-23 DIAGNOSIS — C9002 Multiple myeloma in relapse: Secondary | ICD-10-CM

## 2016-12-23 DIAGNOSIS — Z809 Family history of malignant neoplasm, unspecified: Secondary | ICD-10-CM | POA: Diagnosis not present

## 2016-12-23 DIAGNOSIS — I4891 Unspecified atrial fibrillation: Secondary | ICD-10-CM | POA: Diagnosis not present

## 2016-12-23 DIAGNOSIS — D649 Anemia, unspecified: Secondary | ICD-10-CM | POA: Insufficient documentation

## 2016-12-23 DIAGNOSIS — I10 Essential (primary) hypertension: Secondary | ICD-10-CM | POA: Insufficient documentation

## 2016-12-23 DIAGNOSIS — I2510 Atherosclerotic heart disease of native coronary artery without angina pectoris: Secondary | ICD-10-CM | POA: Diagnosis not present

## 2016-12-23 DIAGNOSIS — F0390 Unspecified dementia without behavioral disturbance: Secondary | ICD-10-CM | POA: Insufficient documentation

## 2016-12-23 DIAGNOSIS — E785 Hyperlipidemia, unspecified: Secondary | ICD-10-CM | POA: Diagnosis not present

## 2016-12-23 DIAGNOSIS — Z8739 Personal history of other diseases of the musculoskeletal system and connective tissue: Secondary | ICD-10-CM | POA: Insufficient documentation

## 2016-12-23 DIAGNOSIS — M25551 Pain in right hip: Secondary | ICD-10-CM | POA: Insufficient documentation

## 2016-12-23 LAB — ABO/RH: ABO/RH(D): A POS

## 2016-12-23 LAB — CBC WITH DIFFERENTIAL/PLATELET
Basophils Absolute: 0 10*3/uL (ref 0–0.1)
Basophils Relative: 1 %
EOS ABS: 0 10*3/uL (ref 0–0.7)
EOS PCT: 0 %
HCT: 31.1 % — ABNORMAL LOW (ref 35.0–47.0)
Hemoglobin: 10.7 g/dL — ABNORMAL LOW (ref 12.0–16.0)
LYMPHS ABS: 0.9 10*3/uL — AB (ref 1.0–3.6)
LYMPHS PCT: 34 %
MCH: 30.1 pg (ref 26.0–34.0)
MCHC: 34.3 g/dL (ref 32.0–36.0)
MCV: 87.9 fL (ref 80.0–100.0)
MONO ABS: 0.8 10*3/uL (ref 0.2–0.9)
MONOS PCT: 30 %
Neutro Abs: 0.9 10*3/uL — ABNORMAL LOW (ref 1.4–6.5)
Neutrophils Relative %: 35 %
PLATELETS: 242 10*3/uL (ref 150–440)
RBC: 3.54 MIL/uL — AB (ref 3.80–5.20)
RDW: 16.3 % — ABNORMAL HIGH (ref 11.5–14.5)
WBC: 2.6 10*3/uL — AB (ref 3.6–11.0)

## 2016-12-23 LAB — COMPREHENSIVE METABOLIC PANEL
ALT: 17 U/L (ref 14–54)
ANION GAP: 4 — AB (ref 5–15)
AST: 24 U/L (ref 15–41)
Albumin: 3.4 g/dL — ABNORMAL LOW (ref 3.5–5.0)
Alkaline Phosphatase: 61 U/L (ref 38–126)
BUN: 20 mg/dL (ref 6–20)
CALCIUM: 8.7 mg/dL — AB (ref 8.9–10.3)
CHLORIDE: 105 mmol/L (ref 101–111)
CO2: 28 mmol/L (ref 22–32)
CREATININE: 0.72 mg/dL (ref 0.44–1.00)
Glucose, Bld: 101 mg/dL — ABNORMAL HIGH (ref 65–99)
Potassium: 4 mmol/L (ref 3.5–5.1)
Sodium: 137 mmol/L (ref 135–145)
Total Bilirubin: 0.3 mg/dL (ref 0.3–1.2)
Total Protein: 7.6 g/dL (ref 6.5–8.1)

## 2016-12-23 LAB — ANTIBODY SCREEN: ANTIBODY SCREEN: NEGATIVE

## 2016-12-23 MED ORDER — SODIUM CHLORIDE 0.9 % IV SOLN
Freq: Once | INTRAVENOUS | Status: AC
  Administered 2016-12-23: 10:00:00 via INTRAVENOUS
  Filled 2016-12-23: qty 1000

## 2016-12-23 MED ORDER — MONTELUKAST SODIUM 10 MG PO TABS
10.0000 mg | ORAL_TABLET | Freq: Once | ORAL | Status: AC
  Administered 2016-12-23: 10 mg via ORAL
  Filled 2016-12-23: qty 1

## 2016-12-23 MED ORDER — SODIUM CHLORIDE 0.9 % IV SOLN
16.0000 mg/kg | Freq: Once | INTRAVENOUS | Status: AC
  Administered 2016-12-23: 740 mg via INTRAVENOUS
  Filled 2016-12-23: qty 37

## 2016-12-23 MED ORDER — SODIUM CHLORIDE 0.9 % IV SOLN
20.0000 mg | Freq: Once | INTRAVENOUS | Status: AC
  Administered 2016-12-23: 20 mg via INTRAVENOUS
  Filled 2016-12-23: qty 2

## 2016-12-23 MED ORDER — HEPARIN SOD (PORK) LOCK FLUSH 100 UNIT/ML IV SOLN
500.0000 [IU] | Freq: Once | INTRAVENOUS | Status: AC
  Administered 2016-12-23: 500 [IU] via INTRAVENOUS

## 2016-12-23 MED ORDER — ACETAMINOPHEN 325 MG PO TABS
ORAL_TABLET | ORAL | Status: AC
  Filled 2016-12-23: qty 2

## 2016-12-23 MED ORDER — SODIUM CHLORIDE 0.9% FLUSH
10.0000 mL | INTRAVENOUS | Status: DC | PRN
  Administered 2016-12-23: 10 mL via INTRAVENOUS
  Filled 2016-12-23: qty 10

## 2016-12-23 MED ORDER — DIPHENHYDRAMINE HCL 25 MG PO CAPS
50.0000 mg | ORAL_CAPSULE | Freq: Once | ORAL | Status: AC
  Administered 2016-12-23: 50 mg via ORAL
  Filled 2016-12-23: qty 2

## 2016-12-23 MED ORDER — ACETAMINOPHEN 325 MG PO TABS
650.0000 mg | ORAL_TABLET | Freq: Once | ORAL | Status: AC
  Administered 2016-12-23: 650 mg via ORAL
  Filled 2016-12-23: qty 2

## 2016-12-23 MED ORDER — PROCHLORPERAZINE MALEATE 10 MG PO TABS
10.0000 mg | ORAL_TABLET | Freq: Once | ORAL | Status: AC
  Administered 2016-12-23: 10 mg via ORAL
  Filled 2016-12-23: qty 1

## 2016-12-23 NOTE — Progress Notes (Signed)
ANC 0.9. MD ok to proceed with treatment. MD has held pomalidomide due to [facility], therefor will proceed with darzalex treatment today.

## 2016-12-23 NOTE — Progress Notes (Signed)
[place] OFFICE PROGRESS NOTE  Patient Care Team: Penny Late, MD as PCP - General (Family Medicine) Penny Gleason, MD (Oncology) Penny Lye, MD (General Surgery)  No matching staging information was found for the patient.   Oncology History   1. Stage III Multiple Myeloma a. 11/20/09: SPEP showed quantitative IgG 4567 mg/dL with M spike of 3.8 g/dL and IFE showing IgG monoclonal protein with light chain specificity.  [place] urine protein electrophoresis showed M spike 27% Bence Jones Protein.  a. 11/28/09: bone marrow biopsy: - PLASMA CELL DYSCRASIA CONSISTENT WITH MULTIPLE MYELOMA (55%PLASMA CELLS ON ASPIRATE, 75% BY CD138 IMMUNOHISTOCHEMISTRY,KAPPA RESTRICTED).TRILINEAGE HEMATOPOIESIS.46 XX,  Normal FISH panel. b. 11/29/09: bone survey: There are numerous, scattered 2 mm to 5 mm concentric  radiolucencies in the bony calvaria.   Two or three similar focal lucent areas in the right  femur.  There are observed changes consistent with disc disease at multiple levels of the cervical spine and multiple levels of the lumbar spine. c. 12/07/09: right humerus: Multiple tiny lucencies noted throughout the right humerus including humeral diaphysis. No fracture. d. 8/11- 4/12 Mephalan/prednisone/velcade X 6 cycles with decrease in Mspike to 1.0 gm/dl. e.osteonecrosis of jaw and diagnosed in December of 2013(Zometa was discontinued) 7.Patient was started on Velcade and Decadron   because of progressive multiple myeloma from January of 2013. 8. Started on Kyprolis 05/13 9.progressing disease on kyphorilis September of 2013 10 patient has been started on [name] daily for 21 days and one week off Decadron day1, 18 and 15   with 4 week cycle is finished palliative radiation therapy without much relief in pain(November, 2013) 11.Palliative radiation therapy to the right shoulder (December, 2014) 12 patient was on break from pomolidomide because of infection and it has  been presumed from December 05, 2014  # July 2017- skeletal survey- stable bone lesions.   # July 10th PET- No bone involvement     Multiple myeloma not having achieved remission ([place])     INTERVAL HISTORY: Patient is a fair historian at best.  [name] 83 y.o.  female pleasant patient With mild-moderate dementia and above history of Multiple myeloma currently on pomolyst is here for follow-up/Accompanied by a caregiver/daughter-in-law/son- Review the results of the PET scan/also to proceed with daratumumab...  As per the family patient had been complaining of worsening pain in her right hip [name] is different from her previous pelvic pain post fracture]. She has been on fentanyl patch and oxycodone which she has been taking more frequently at this time. No nausea no vomiting. No skin rash. No diarrhea. No chest pain or shortness of breath or cough. Denies any tingling or numbness.   REVIEW OF SYSTEMS:  A complete 10 point review of system is done which is negative except mentioned above/history of present illness.   PAST MEDICAL HISTORY :  Past Medical History:  Diagnosis Date  . Anxiety   . Arthritis   . Atrial fibrillation ([employer])   . Coronary arteriosclerosis   . History of blood transfusion 2014  . Hyperlipidemia   . Hypertension   . Multiple myeloma ([place])   . Multiple myeloma in relapse ([place]) 10/29/2014  . Osteonecrosis of jaw ([place])    left    PAST SURGICAL HISTORY :   Past Surgical History:  Procedure Laterality Date  . ABDOMINAL HYSTERECTOMY    . APPENDECTOMY    . PARTIAL COLECTOMY    . TOTAL KNEE REVISION Left     FAMILY HISTORY :  Family History  Problem Relation Age of Onset  . Cancer Father   . Heart attack Mother     SOCIAL HISTORY:   Social History  Substance Use Topics  . Smoking status: Never Smoker  . Smokeless tobacco: Never Used  . Alcohol use 1.2 oz/week    2 Glasses of wine per week    ALLERGIES:  is allergic to biaxin  [clarithromycin]; demerol [meperidine]; hydrocodone; sulfa antibiotics; tramadol; and ultracet [tramadol-acetaminophen].  MEDICATIONS:  Current Outpatient Prescriptions  Medication Sig Dispense Refill  . acetaminophen (TYLENOL 8 HOUR) 650 MG CR tablet Take 650 mg by mouth. 1 tablet twice a day x 4 days. Start taking 2 days prior to [facility] treatment, on the day of treatment and day after treatment.    Marland Kitchen acyclovir (ZOVIRAX) 400 MG tablet One pill a day [to prevent shingles] 30 tablet 3  . Biotin 1 MG CAPS Take 1 capsule by mouth daily.     . Calcium-Magnesium-Vitamin D (CALCIUM 500 PO) Take 1 tablet by mouth daily.     Marland Kitchen dexamethasone (DECADRON) 4 MG tablet Take 1 tablet (4 mg total) by mouth 2 (two) times daily. Start 2 days prior to infusion; Take it for 4 days. 60 tablet 3  . Docusate Sodium (COLACE PO) Take 2 tablets by mouth 2 (two) times daily.    . fentaNYL (DURAGESIC - DOSED MCG/HR) 25 MCG/HR patch Place 1 patch (25 mcg total) onto the skin every 3 (three) days. 10 patch 0  . ferrous sulfate 325 (65 FE) MG EC tablet Take 325 mg by mouth daily.    Marland Kitchen loratadine (CLARITIN) 10 MG tablet Take 10 mg by mouth. 1 tablet a day x 4 days. Start taking 2 days prior to [facility] treatment, on the day of treatment and day after treatment.    . mirtazapine (REMERON) 7.5 MG tablet Take 30 mg by mouth at bedtime.     . montelukast (SINGULAIR) 10 MG tablet Take 1 tablet (10 mg total) by mouth at bedtime. Start 2 days prior to infusion. Take it for 4 days. 60 tablet 3  . Multiple Vitamins-Minerals (CENTRUM SILVER 50+WOMEN PO) Take 1 tablet by mouth daily.     Marland Kitchen oxyCODONE (OXY IR/ROXICODONE) 5 MG immediate release tablet Take 0.5 tablets (2.5 mg total) by mouth every 8 (eight) hours as needed for severe pain. 30 tablet 0  . polyethylene glycol (MIRALAX / GLYCOLAX) packet Take 17 g by mouth daily as needed for mild constipation.    . pomalidomide (POMALYST) 4 MG capsule TAKE 1 CAPSULE BY MOUTH DAILY FOR 14 DAYS  ON FOLLOWED BY 14 DAYS OFF. 14 capsule 0  . ranitidine (ZANTAC) 150 MG capsule Take 150 mg by mouth 2 (two) times daily. 1 tablet twice a day x 4 days. Start taking 2 days prior to [facility] treatment, on the day of treatment and day after treatment.    . risperiDONE (RISPERDAL) 0.25 MG tablet Take 2 tablets (0.5 mg total) by mouth at bedtime. 60 tablet 3  . ondansetron (ZOFRAN) 4 MG tablet Take 1 tablet (4 mg total) by mouth every 8 (eight) hours as needed for nausea or vomiting. (Patient not taking: Reported on 10/01/2016) 20 tablet 0  . prochlorperazine (COMPAZINE) 10 MG tablet Take 1 tablet (10 mg total) by mouth every 8 (eight) hours as needed for nausea or vomiting. (Patient not taking: Reported on 10/01/2016) 30 tablet 1   No current facility-administered medications for this visit.    Facility-Administered Medications Ordered  in Other Visits  Medication Dose Route Frequency Provider Last Rate Last Dose  . sodium chloride 0.9 % injection 10 mL  10 mL Intravenous PRN Penny Gleason, MD   10 mL at 03/01/15 1305  . sodium chloride flush (NS) 0.9 % injection 10 mL  10 mL Intravenous PRN Cammie Sickle, MD   10 mL at 12/23/16 0817    PHYSICAL EXAMINATION: ECOG PERFORMANCE STATUS: 0 - Asymptomatic  BP (!) 172/78   Pulse 71   Temp (!) 96.9 F (36.1 C) (Tympanic)   Resp 18   Ht 5' 3" (1.6 m)   Wt 101 lb (45.8 kg)   BMI 17.89 kg/m   Filed Weights   12/23/16 0838  Weight: 101 lb (45.8 kg)    GENERAL: frail-appearing elderly female patiett Alert, no distress and comfortable. Accompanied by her care giver/ daughter-in-law. Patient is walking. EYES: no pallor or icterus OROPHARYNX: no thrush or ulceration; good dentition  NECK: supple, no masses felt LYMPH:  no palpable lymphadenopathy in the cervical, axillary or inguinal regions LUNGS: clear to auscultation and  No wheeze or crackles HEART/CVS: regular rate & rhythm and no murmurs; no swelling of the legs. No tenderness.   ABDOMEN:abdomen soft, non-tender and normal bowel sounds Musculoskeletal:no cyanosis of digits and no clubbing  PSYCH: alert & oriented x 3 with fluent speech NEURO: no focal motor/sensory deficits SKIN:  no rashes or significant lesions  LABORATORY DATA:  I have reviewed the data as listed    Component Value Date/Time   NA 137 12/23/2016 0811   NA 138 09/19/2014 1118   K 4.0 12/23/2016 0811   K 4.0 09/19/2014 1118   CL 105 12/23/2016 0811   CL 103 09/19/2014 1118   CO2 28 12/23/2016 0811   CO2 29 09/19/2014 1118   GLUCOSE 101 (H) 12/23/2016 0811   GLUCOSE 91 09/19/2014 1118   BUN 20 12/23/2016 0811   BUN 15 09/19/2014 1118   CREATININE 0.72 12/23/2016 0811   CREATININE 0.69 09/19/2014 1118   CALCIUM 8.7 (L) 12/23/2016 0811   CALCIUM 9.0 09/19/2014 1118   PROT 7.6 12/23/2016 0811   PROT 6.8 09/19/2014 1118   ALBUMIN 3.4 (L) 12/23/2016 0811   ALBUMIN 3.9 09/19/2014 1118   AST 24 12/23/2016 0811   AST 20 09/19/2014 1118   ALT 17 12/23/2016 0811   ALT 16 09/19/2014 1118   ALKPHOS 61 12/23/2016 0811   ALKPHOS 85 09/19/2014 1118   BILITOT 0.3 12/23/2016 0811   BILITOT 0.6 09/19/2014 1118   GFRNONAA >60 12/23/2016 0811   GFRNONAA >60 09/19/2014 1118   GFRAA >60 12/23/2016 0811   GFRAA >60 09/19/2014 1118    No results found for: SPEP, UPEP  Lab Results  Component Value Date   WBC 2.6 (L) 12/23/2016   NEUTROABS 0.9 (L) 12/23/2016   HGB 10.7 (L) 12/23/2016   HCT 31.1 (L) 12/23/2016   MCV 87.9 12/23/2016   PLT 242 12/23/2016      Chemistry      Component Value Date/Time   NA 137 12/23/2016 0811   NA 138 09/19/2014 1118   K 4.0 12/23/2016 0811   K 4.0 09/19/2014 1118   CL 105 12/23/2016 0811   CL 103 09/19/2014 1118   CO2 28 12/23/2016 0811   CO2 29 09/19/2014 1118   BUN 20 12/23/2016 0811   BUN 15 09/19/2014 1118   CREATININE 0.72 12/23/2016 0811   CREATININE 0.69 09/19/2014 1118      Component Value Date/Time  CALCIUM 8.7 (L) 12/23/2016 0811    CALCIUM 9.0 09/19/2014 1118   ALKPHOS 61 12/23/2016 0811   ALKPHOS 85 09/19/2014 1118   AST 24 12/23/2016 0811   AST 20 09/19/2014 1118   ALT 17 12/23/2016 0811   ALT 16 09/19/2014 1118   BILITOT 0.3 12/23/2016 0811   BILITOT 0.6 09/19/2014 1118      IMPRESSION: No FDG avid myelomatous lesions are evident.  Mildly hypermetabolic mediastinal and bilateral perihilar lymph nodes, likely reactive.   Electronically Signed   By: Julian Hy M.D.   On: 12/13/2016 11:41     RADIOGRAPHIC STUDIES: I have personally reviewed the radiological images as listed and agreed with the findings in the report. No results found.   ASSESSMENT & PLAN:  Multiple myeloma not having achieved remission ([facility]) Multiple myeloma relapsed currently on pomolyst 4 mg 2 weeks on 2 week off- patient seems to be tolerating well without any major side effects. However slow progression noted;  June  2018 M protein 1.6 g [slightly elevated/rising]; kappa [113] lambda light chain ratio= elevated; up to 8.7. Biochemical progression; currently with worsening anemia [stable 10];july 10th PET no evidence of any uptake in the bones.   # Absolute neutrophil count of 900; white count of 2.9 hemoglobin 10 platelets normal. STOP pomolyst sec to neutropenia.  We will reevaluate at next cycle.    # Discussed re: rising M- proteins- need to adding dara- currently no worsening end organ dysfunction. Discussed re: potential drug reactions in length; long infusion time.  #  Pre-medications/shingles prophylaxis steroids/Claritin/Singulair/tylenol/zantac.   # Right hip pain- ? Likely arthritic pain. PET-negative.  Continue fentanyl patch 25 and also using oxycodone 5 mg prn.  # Elevated Blood pressure-? Sec to steroids; recommend checking at home/bring log.    # confusion/delirium/ slow cognitive decline/history of falls -currently stable. on 0.5 mg respiridal qhs.  # History of osteonecrosis of the jaw- not on  Zometa.  # 1 week/labs/infusion; 2 weeks/labs/infusion/MD.   Orders Placed This Encounter  Procedures  . Pretreatment RBC phenotype    Obtain prior to daratumumab treatment.    Standing Status:   Future    Number of Occurrences:   1    Standing Expiration Date:   12/19/2017  . CBC with Differential    Standing Status:   Standing    Number of Occurrences:   20    Standing Expiration Date:   12/23/2017  . Basic metabolic panel    Standing Status:   Standing    Number of Occurrences:   20    Standing Expiration Date:   12/23/2017  . Type and screen    Obtain prior to daratumumab treatment.    Standing Status:   Future    Number of Occurrences:   1    Standing Expiration Date:   12/23/2017   All questions were answered. The patient knows to call the clinic with any problems, questions or concerns.      Cammie Sickle, MD 12/23/2016 12:45 PM

## 2016-12-23 NOTE — Assessment & Plan Note (Addendum)
Multiple myeloma relapsed currently on pomolyst 4 mg 2 weeks on 2 week off- patient seems to be tolerating well without any major side effects. However slow progression noted;  June  2018 M protein 1.6 g [slightly elevated/rising]; kappa [113] lambda light chain ratio= elevated; up to 8.7. Biochemical progression; currently with worsening anemia [stable 10];july 10th PET no evidence of any uptake in the bones.   # Absolute neutrophil count of 900; white count of 2.9 hemoglobin 10 platelets normal. STOP pomolyst sec to neutropenia.  We will reevaluate at next cycle.    # Discussed re: rising M- proteins- need to adding dara- currently no worsening end organ dysfunction. Discussed re: potential drug reactions in length; long infusion time.  #  Pre-medications/shingles prophylaxis steroids/Claritin/Singulair/tylenol/zantac.   # Right hip pain- ? Likely arthritic pain. PET-negative.  Continue fentanyl patch 25 and also using oxycodone 5 mg prn.  # Elevated Blood pressure-? Sec to steroids; recommend checking at home/bring log.    # confusion/delirium/ slow cognitive decline/history of falls -currently stable. on 0.5 mg respiridal qhs.  # History of osteonecrosis of the jaw- not on Zometa.  # 1 week/labs/infusion; 2 weeks/labs/infusion/MD.

## 2016-12-23 NOTE — Progress Notes (Signed)
Patient here for follow-up for myeloma. Pt c/o right hip pain-Sore tenderness in hip joint. Medication verification performed with caregiver-[place]

## 2016-12-25 DIAGNOSIS — C9000 Multiple myeloma not having achieved remission: Secondary | ICD-10-CM | POA: Diagnosis not present

## 2016-12-25 DIAGNOSIS — K219 Gastro-esophageal reflux disease without esophagitis: Secondary | ICD-10-CM | POA: Diagnosis not present

## 2016-12-25 DIAGNOSIS — R634 Abnormal weight loss: Secondary | ICD-10-CM | POA: Diagnosis not present

## 2016-12-25 DIAGNOSIS — M0609 Rheumatoid arthritis without rheumatoid factor, multiple sites: Secondary | ICD-10-CM | POA: Diagnosis not present

## 2016-12-25 DIAGNOSIS — F5104 Psychophysiologic insomnia: Secondary | ICD-10-CM | POA: Diagnosis not present

## 2016-12-25 LAB — DARATUMUMAB PRETREATMENT RBC PHENOTYPE: DAT, IGG: NEGATIVE

## 2016-12-25 LAB — PRETREATMENT RBC PHENOTYPE

## 2016-12-30 ENCOUNTER — Other Ambulatory Visit: Payer: Medicare Other

## 2016-12-30 ENCOUNTER — Ambulatory Visit: Payer: Medicare Other

## 2016-12-31 ENCOUNTER — Other Ambulatory Visit: Payer: Medicare Other

## 2016-12-31 ENCOUNTER — Ambulatory Visit: Payer: Medicare Other

## 2017-01-01 ENCOUNTER — Telehealth: Payer: Self-pay | Admitting: Internal Medicine

## 2017-01-01 ENCOUNTER — Inpatient Hospital Stay: Payer: Medicare Other

## 2017-01-01 ENCOUNTER — Other Ambulatory Visit: Payer: Self-pay | Admitting: *Deleted

## 2017-01-01 VITALS — BP 178/75 | HR 71 | Temp 97.1°F | Resp 20

## 2017-01-01 DIAGNOSIS — Z5112 Encounter for antineoplastic immunotherapy: Secondary | ICD-10-CM | POA: Diagnosis not present

## 2017-01-01 DIAGNOSIS — C9002 Multiple myeloma in relapse: Secondary | ICD-10-CM | POA: Diagnosis not present

## 2017-01-01 DIAGNOSIS — C9000 Multiple myeloma not having achieved remission: Secondary | ICD-10-CM

## 2017-01-01 DIAGNOSIS — F0390 Unspecified dementia without behavioral disturbance: Secondary | ICD-10-CM | POA: Diagnosis not present

## 2017-01-01 DIAGNOSIS — Z923 Personal history of irradiation: Secondary | ICD-10-CM | POA: Diagnosis not present

## 2017-01-01 DIAGNOSIS — D649 Anemia, unspecified: Secondary | ICD-10-CM | POA: Diagnosis not present

## 2017-01-01 DIAGNOSIS — Z8739 Personal history of other diseases of the musculoskeletal system and connective tissue: Secondary | ICD-10-CM | POA: Diagnosis not present

## 2017-01-01 LAB — BASIC METABOLIC PANEL
Anion gap: 4 — ABNORMAL LOW (ref 5–15)
BUN: 27 mg/dL — AB (ref 6–20)
CHLORIDE: 104 mmol/L (ref 101–111)
CO2: 25 mmol/L (ref 22–32)
Calcium: 8.4 mg/dL — ABNORMAL LOW (ref 8.9–10.3)
Creatinine, Ser: 0.7 mg/dL (ref 0.44–1.00)
GFR calc Af Amer: >60 mL/min (ref >60)
GFR calc non Af Amer: >60 mL/min (ref >60)
GLUCOSE: 105 mg/dL — AB (ref 65–99)
POTASSIUM: 4 mmol/L (ref 3.5–5.1)
Sodium: 133 mmol/L — ABNORMAL LOW (ref 135–145)

## 2017-01-01 LAB — CBC WITH DIFFERENTIAL/PLATELET
Basophils Absolute: 0 10*3/uL (ref 0–0.1)
Basophils Relative: 1 %
EOS PCT: 0 %
Eosinophils Absolute: 0 10*3/uL (ref 0–0.7)
HEMATOCRIT: 33.4 % — AB (ref 35.0–47.0)
HEMOGLOBIN: 11.3 g/dL — AB (ref 12.0–16.0)
LYMPHS ABS: 0.6 10*3/uL — AB (ref 1.0–3.6)
LYMPHS PCT: 8 %
MCH: 29.9 pg (ref 26.0–34.0)
MCHC: 34 g/dL (ref 32.0–36.0)
MCV: 88 fL (ref 80.0–100.0)
Monocytes Absolute: 0.8 10*3/uL (ref 0.2–0.9)
Monocytes Relative: 11 %
Neutro Abs: 6.2 10*3/uL (ref 1.4–6.5)
Neutrophils Relative %: 80 %
Platelets: 238 10*3/uL (ref 150–440)
RBC: 3.79 MIL/uL — AB (ref 3.80–5.20)
RDW: 16.6 % — ABNORMAL HIGH (ref 11.5–14.5)
WBC: 7.7 10*3/uL (ref 3.6–11.0)

## 2017-01-01 MED ORDER — HEPARIN SOD (PORK) LOCK FLUSH 100 UNIT/ML IV SOLN
500.0000 [IU] | Freq: Once | INTRAVENOUS | Status: AC | PRN
  Administered 2017-01-01: 500 [IU]

## 2017-01-01 MED ORDER — PROCHLORPERAZINE MALEATE 10 MG PO TABS
10.0000 mg | ORAL_TABLET | Freq: Once | ORAL | Status: AC
  Administered 2017-01-01: 10 mg via ORAL
  Filled 2017-01-01: qty 1

## 2017-01-01 MED ORDER — ACETAMINOPHEN 325 MG PO TABS
650.0000 mg | ORAL_TABLET | Freq: Once | ORAL | Status: AC
  Administered 2017-01-01: 650 mg via ORAL
  Filled 2017-01-01: qty 2

## 2017-01-01 MED ORDER — SODIUM CHLORIDE 0.9 % IV SOLN
20.0000 mg | Freq: Once | INTRAVENOUS | Status: AC
  Administered 2017-01-01: 20 mg via INTRAVENOUS
  Filled 2017-01-01: qty 2

## 2017-01-01 MED ORDER — SODIUM CHLORIDE 0.9 % IV SOLN
Freq: Once | INTRAVENOUS | Status: AC
  Administered 2017-01-01: 09:00:00 via INTRAVENOUS
  Filled 2017-01-01: qty 1000

## 2017-01-01 MED ORDER — FENTANYL 25 MCG/HR TD PT72: 25.0000 ug | MEDICATED_PATCH | TRANSDERMAL | 0 refills | Status: DC

## 2017-01-01 MED ORDER — SODIUM CHLORIDE 0.9 % IV SOLN
800.0000 mg | Freq: Once | INTRAVENOUS | Status: AC
  Administered 2017-01-01: 800 mg via INTRAVENOUS
  Filled 2017-01-01: qty 40

## 2017-01-01 MED ORDER — DIPHENHYDRAMINE HCL 25 MG PO CAPS
50.0000 mg | ORAL_CAPSULE | Freq: Once | ORAL | Status: AC
  Administered 2017-01-01: 50 mg via ORAL
  Filled 2017-01-01: qty 2

## 2017-01-01 MED ORDER — SODIUM CHLORIDE 0.9 % IV SOLN: 16.0000 mg/kg | Freq: Once | INTRAVENOUS | Status: DC

## 2017-01-01 NOTE — Telephone Encounter (Signed)
Per md- restlessness is most likely due to steriod-premed use.  Per Dr. Rogue Bussing- please have patient hold tonight's dose and tomorrow's dose of steriods. md will not make any adjustments to Remeron at this time.  md approved fentanyl patch renewal

## 2017-01-01 NOTE — Telephone Encounter (Signed)
[name] reports that [name] is not sleeping at night and is getting up roaming the house in the middle of the night and is afraid she is going to fall and get hurt. She is currently on Remeron 7.5 mg at HS. Asking if there is something else that can be tried, Please advise

## 2017-01-01 NOTE — Telephone Encounter (Signed)
#   please inform pt/daughte-in-law re: below  # Recommend holding off steroids [dex] tonite/ and tomorrow.  # prior to next treatment in 1 week/ take dex 4mg  BID only the day prior of treatment [only 1 day; instead of 2 days]  # Will plan slowly weaning off steroids/pre-med for darzalex.

## 2017-01-01 NOTE — Telephone Encounter (Signed)
Hassan Rowan, please see below

## 2017-01-06 ENCOUNTER — Ambulatory Visit: Payer: Medicare Other | Admitting: Internal Medicine

## 2017-01-06 ENCOUNTER — Other Ambulatory Visit: Payer: Medicare Other

## 2017-01-06 ENCOUNTER — Ambulatory Visit: Payer: Medicare Other

## 2017-01-07 ENCOUNTER — Ambulatory Visit: Payer: Medicare Other

## 2017-01-07 ENCOUNTER — Ambulatory Visit: Payer: Medicare Other | Admitting: Internal Medicine

## 2017-01-07 ENCOUNTER — Other Ambulatory Visit: Payer: Medicare Other

## 2017-01-08 ENCOUNTER — Inpatient Hospital Stay: Payer: Medicare Other | Attending: Internal Medicine | Admitting: Internal Medicine

## 2017-01-08 ENCOUNTER — Inpatient Hospital Stay: Payer: Medicare Other

## 2017-01-08 ENCOUNTER — Other Ambulatory Visit: Payer: Self-pay

## 2017-01-08 VITALS — BP 122/70 | HR 74 | Temp 96.6°F | Resp 16 | Wt 100.2 lb

## 2017-01-08 DIAGNOSIS — I2510 Atherosclerotic heart disease of native coronary artery without angina pectoris: Secondary | ICD-10-CM | POA: Diagnosis not present

## 2017-01-08 DIAGNOSIS — Z8739 Personal history of other diseases of the musculoskeletal system and connective tissue: Secondary | ICD-10-CM | POA: Diagnosis not present

## 2017-01-08 DIAGNOSIS — E785 Hyperlipidemia, unspecified: Secondary | ICD-10-CM | POA: Diagnosis not present

## 2017-01-08 DIAGNOSIS — Z5112 Encounter for antineoplastic immunotherapy: Secondary | ICD-10-CM | POA: Insufficient documentation

## 2017-01-08 DIAGNOSIS — C9002 Multiple myeloma in relapse: Secondary | ICD-10-CM | POA: Diagnosis not present

## 2017-01-08 DIAGNOSIS — F0390 Unspecified dementia without behavioral disturbance: Secondary | ICD-10-CM | POA: Insufficient documentation

## 2017-01-08 DIAGNOSIS — R630 Anorexia: Secondary | ICD-10-CM | POA: Insufficient documentation

## 2017-01-08 DIAGNOSIS — M25551 Pain in right hip: Secondary | ICD-10-CM | POA: Diagnosis not present

## 2017-01-08 DIAGNOSIS — G8929 Other chronic pain: Secondary | ICD-10-CM | POA: Diagnosis not present

## 2017-01-08 DIAGNOSIS — M1990 Unspecified osteoarthritis, unspecified site: Secondary | ICD-10-CM | POA: Diagnosis not present

## 2017-01-08 DIAGNOSIS — I4891 Unspecified atrial fibrillation: Secondary | ICD-10-CM

## 2017-01-08 DIAGNOSIS — Z9181 History of falling: Secondary | ICD-10-CM | POA: Insufficient documentation

## 2017-01-08 DIAGNOSIS — Z923 Personal history of irradiation: Secondary | ICD-10-CM | POA: Diagnosis not present

## 2017-01-08 DIAGNOSIS — D649 Anemia, unspecified: Secondary | ICD-10-CM | POA: Insufficient documentation

## 2017-01-08 DIAGNOSIS — M549 Dorsalgia, unspecified: Secondary | ICD-10-CM | POA: Diagnosis not present

## 2017-01-08 DIAGNOSIS — Z79899 Other long term (current) drug therapy: Secondary | ICD-10-CM | POA: Insufficient documentation

## 2017-01-08 DIAGNOSIS — I10 Essential (primary) hypertension: Secondary | ICD-10-CM | POA: Diagnosis not present

## 2017-01-08 DIAGNOSIS — C9000 Multiple myeloma not having achieved remission: Secondary | ICD-10-CM

## 2017-01-08 DIAGNOSIS — F419 Anxiety disorder, unspecified: Secondary | ICD-10-CM | POA: Diagnosis not present

## 2017-01-08 LAB — BASIC METABOLIC PANEL
Anion gap: 9 (ref 5–15)
BUN: 27 mg/dL — ABNORMAL HIGH (ref 6–20)
CALCIUM: 8.7 mg/dL — AB (ref 8.9–10.3)
CHLORIDE: 102 mmol/L (ref 101–111)
CO2: 23 mmol/L (ref 22–32)
CREATININE: 0.77 mg/dL (ref 0.44–1.00)
Glucose, Bld: 160 mg/dL — ABNORMAL HIGH (ref 65–99)
Potassium: 3.9 mmol/L (ref 3.5–5.1)
SODIUM: 134 mmol/L — AB (ref 135–145)

## 2017-01-08 LAB — CBC WITH DIFFERENTIAL/PLATELET
BASOS PCT: 0 %
Basophils Absolute: 0 10*3/uL (ref 0–0.1)
EOS ABS: 0 10*3/uL (ref 0–0.7)
EOS PCT: 0 %
HCT: 34.7 % — ABNORMAL LOW (ref 35.0–47.0)
HEMOGLOBIN: 11.9 g/dL — AB (ref 12.0–16.0)
Lymphocytes Relative: 19 %
Lymphs Abs: 0.7 10*3/uL — ABNORMAL LOW (ref 1.0–3.6)
MCH: 30.2 pg (ref 26.0–34.0)
MCHC: 34.3 g/dL (ref 32.0–36.0)
MCV: 88 fL (ref 80.0–100.0)
MONOS PCT: 11 %
Monocytes Absolute: 0.4 10*3/uL (ref 0.2–0.9)
NEUTROS PCT: 70 %
Neutro Abs: 2.8 10*3/uL (ref 1.4–6.5)
PLATELETS: 197 10*3/uL (ref 150–440)
RBC: 3.95 MIL/uL (ref 3.80–5.20)
RDW: 16.6 % — AB (ref 11.5–14.5)
WBC: 3.9 10*3/uL (ref 3.6–11.0)

## 2017-01-08 MED ORDER — OXYCODONE HCL 5 MG PO TABS: 2.5000 mg | ORAL_TABLET | Freq: Three times a day (TID) | ORAL | 0 refills | Status: DC | PRN

## 2017-01-08 MED ORDER — SODIUM CHLORIDE 0.9 % IV SOLN
20.0000 mg | Freq: Once | INTRAVENOUS | Status: AC
  Administered 2017-01-08: 20 mg via INTRAVENOUS
  Filled 2017-01-08: qty 2

## 2017-01-08 MED ORDER — ACETAMINOPHEN 325 MG PO TABS
650.0000 mg | ORAL_TABLET | Freq: Once | ORAL | Status: AC
  Administered 2017-01-08: 650 mg via ORAL
  Filled 2017-01-08: qty 2

## 2017-01-08 MED ORDER — SODIUM CHLORIDE 0.9 % IV SOLN
Freq: Once | INTRAVENOUS | Status: AC
  Administered 2017-01-08: 10:00:00 via INTRAVENOUS
  Filled 2017-01-08: qty 1000

## 2017-01-08 MED ORDER — PROCHLORPERAZINE MALEATE 10 MG PO TABS
10.0000 mg | ORAL_TABLET | Freq: Once | ORAL | Status: AC
  Administered 2017-01-08: 10 mg via ORAL
  Filled 2017-01-08: qty 1

## 2017-01-08 MED ORDER — HEPARIN SOD (PORK) LOCK FLUSH 100 UNIT/ML IV SOLN
500.0000 [IU] | Freq: Once | INTRAVENOUS | Status: AC
  Administered 2017-01-08: 500 [IU] via INTRAVENOUS
  Filled 2017-01-08: qty 5

## 2017-01-08 MED ORDER — SODIUM CHLORIDE 0.9% FLUSH
10.0000 mL | Freq: Once | INTRAVENOUS | Status: AC
  Administered 2017-01-08: 10 mL via INTRAVENOUS
  Filled 2017-01-08: qty 10

## 2017-01-08 MED ORDER — DIPHENHYDRAMINE HCL 25 MG PO CAPS
50.0000 mg | ORAL_CAPSULE | Freq: Once | ORAL | Status: AC
  Administered 2017-01-08: 50 mg via ORAL
  Filled 2017-01-08: qty 2

## 2017-01-08 MED ORDER — MIRTAZAPINE 30 MG PO TABS: 30.0000 mg | ORAL_TABLET | Freq: Every day | ORAL | 2 refills | Status: DC

## 2017-01-08 MED ORDER — SODIUM CHLORIDE 0.9 % IV SOLN
800.0000 mg | Freq: Once | INTRAVENOUS | Status: AC
  Administered 2017-01-08: 800 mg via INTRAVENOUS
  Filled 2017-01-08: qty 40

## 2017-01-08 NOTE — Progress Notes (Signed)
[facility] OFFICE PROGRESS NOTE  Patient Care Team: Derinda Late, MD as PCP - General (Family Medicine) Forest Gleason, MD (Oncology) Christene Lye, MD (General Surgery)  No matching staging information was found for the patient.   Oncology History   1. Stage III Multiple Myeloma a. 11/20/09: SPEP showed quantitative IgG 4567 mg/dL with M spike of 3.8 g/dL and IFE showing IgG monoclonal protein with light chain specificity.  [place] urine protein electrophoresis showed M spike 27% Bence Jones Protein.  a. 11/28/09: bone marrow biopsy: - PLASMA CELL DYSCRASIA CONSISTENT WITH MULTIPLE MYELOMA (55%PLASMA CELLS ON ASPIRATE, 75% BY CD138 IMMUNOHISTOCHEMISTRY,KAPPA RESTRICTED).TRILINEAGE HEMATOPOIESIS.46 XX,  Normal FISH panel. b. 11/29/09: bone survey: There are numerous, scattered 2 mm to 5 mm concentric  radiolucencies in the bony calvaria.   Two or three similar focal lucent areas in the right  femur.  There are observed changes consistent with disc disease at multiple levels of the cervical spine and multiple levels of the lumbar spine. c. 12/07/09: right humerus: Multiple tiny lucencies noted throughout the right humerus including humeral diaphysis. No fracture. d. 8/11- 4/12 Mephalan/prednisone/velcade X 6 cycles with decrease in Mspike to 1.0 gm/dl. e.osteonecrosis of jaw and diagnosed in December of 2013(Zometa was discontinued) 7.Patient was started on Velcade and Decadron   because of progressive multiple myeloma from January of 2013. 8. Started on Kyprolis 05/13 9.progressing disease on kyphorilis September of 2013 10 patient has been started on [name] daily for 21 days and one week off Decadron day1, 18 and 15   with 4 week cycle is finished palliative radiation therapy without much relief in pain(November, 2013) 11.Palliative radiation therapy to the right shoulder (December, 2014) 12 patient was on break from pomolidomide because of infection and it has  been presumed from December 05, 2014  # July 2017- skeletal survey- stable bone lesions.   # July 10th PET- No bone involvement  # July 16th 2018- Dara; stop Pom sec cytopenia.   ------------------------------------------------------  # History of osteonecrosis of the jaw- not on Zometa.     Multiple myeloma not having achieved remission ([place])     INTERVAL HISTORY: Patient is a fair historian at best.  [name] 83 y.o.  female pleasant patient With mild-moderate dementia and above history of Multiple myeloma currently daratumumab Status post cycle #1 day 8 is here for follow-up.  Patient tolerated infusions without any major reactions.  However as per family patient has been "hyper" postinfusion difficulty sleeping.  She has been on fentanyl patch and oxycodone- only as needed.  No nausea no vomiting. No skin rash. No diarrhea. No chest pain or shortness of breath or cough. Denies any tingling or numbness.   REVIEW OF SYSTEMS:  A complete 10 point review of system is done which is negative except mentioned above/history of present illness.   PAST MEDICAL HISTORY :  Past Medical History:  Diagnosis Date  . Anxiety   . Arthritis   . Atrial fibrillation ([place])   . Coronary arteriosclerosis   . History of blood transfusion 2014  . Hyperlipidemia   . Hypertension   . Multiple myeloma ([place])   . Multiple myeloma in relapse ([place]) 10/29/2014  . Osteonecrosis of jaw ([place])    left    PAST SURGICAL HISTORY :   Past Surgical History:  Procedure Laterality Date  . ABDOMINAL HYSTERECTOMY    . APPENDECTOMY    . PARTIAL COLECTOMY    . TOTAL KNEE REVISION Left  FAMILY HISTORY :   Family History  Problem Relation Age of Onset  . Cancer Father   . Heart attack Mother     SOCIAL HISTORY:   Social History  Substance Use Topics  . Smoking status: Never Smoker  . Smokeless tobacco: Never Used  . Alcohol use 1.2 oz/week    2 Glasses of wine per week    ALLERGIES:   is allergic to biaxin [clarithromycin]; demerol [meperidine]; hydrocodone; sulfa antibiotics; tramadol; and ultracet [tramadol-acetaminophen].  MEDICATIONS:  Current Outpatient Prescriptions  Medication Sig Dispense Refill  . acetaminophen (TYLENOL 8 HOUR) 650 MG CR tablet Take 650 mg by mouth. 1 tablet twice a day x 4 days. Start taking 2 days prior to [facility] treatment, on the day of treatment and day after treatment.    Marland Kitchen acyclovir (ZOVIRAX) 400 MG tablet One pill a day [to prevent shingles] 30 tablet 3  . Biotin 1 MG CAPS Take 1 capsule by mouth daily.     . Calcium-Magnesium-Vitamin D (CALCIUM 500 PO) Take 1 tablet by mouth daily.     Mariane Baumgarten Sodium (COLACE PO) Take 2 tablets by mouth 2 (two) times daily.    . fentaNYL (DURAGESIC - DOSED MCG/HR) 25 MCG/HR patch Place 1 patch (25 mcg total) onto the skin every 3 (three) days. 10 patch 0  . ferrous sulfate 325 (65 FE) MG EC tablet Take 325 mg by mouth daily.    Marland Kitchen loratadine (CLARITIN) 10 MG tablet Take 10 mg by mouth. 1 tablet a day x 4 days. Start taking 2 days prior to [facility] treatment, on the day of treatment and day after treatment.    . mirtazapine (REMERON) 30 MG tablet Take 1 tablet (30 mg total) by mouth at bedtime. 60 tablet 2  . montelukast (SINGULAIR) 10 MG tablet Take 1 tablet (10 mg total) by mouth at bedtime. Start 2 days prior to infusion. Take it for 4 days. 60 tablet 3  . Multiple Vitamins-Minerals (CENTRUM SILVER 50+WOMEN PO) Take 1 tablet by mouth daily.     . ondansetron (ZOFRAN) 4 MG tablet Take 1 tablet (4 mg total) by mouth every 8 (eight) hours as needed for nausea or vomiting. 20 tablet 0  . polyethylene glycol (MIRALAX / GLYCOLAX) packet Take 17 g by mouth daily as needed for mild constipation.    . prochlorperazine (COMPAZINE) 10 MG tablet Take 1 tablet (10 mg total) by mouth every 8 (eight) hours as needed for nausea or vomiting. 30 tablet 1  . ranitidine (ZANTAC) 150 MG capsule Take 150 mg by mouth 2 (two)  times daily. 1 tablet twice a day x 4 days. Start taking 2 days prior to [facility] treatment, on the day of treatment and day after treatment.    . risperiDONE (RISPERDAL) 0.25 MG tablet Take 2 tablets (0.5 mg total) by mouth at bedtime. 60 tablet 3  . dexamethasone (DECADRON) 4 MG tablet Take 1 tablet (4 mg total) by mouth 2 (two) times daily. Start 2 days prior to infusion; Take it for 4 days. 60 tablet 3  . oxyCODONE (OXY IR/ROXICODONE) 5 MG immediate release tablet Take 0.5 tablets (2.5 mg total) by mouth every 8 (eight) hours as needed for severe pain. 30 tablet 0  . pomalidomide (POMALYST) 4 MG capsule TAKE 1 CAPSULE BY MOUTH DAILY FOR 14 DAYS ON FOLLOWED BY 14 DAYS OFF. (Patient not taking: Reported on 01/08/2017) 14 capsule 0   No current facility-administered medications for this visit.    Facility-Administered  Medications Ordered in Other Visits  Medication Dose Route Frequency Provider Last Rate Last Dose  . sodium chloride 0.9 % injection 10 mL  10 mL Intravenous PRN Forest Gleason, MD   10 mL at 03/01/15 1305    PHYSICAL EXAMINATION: ECOG PERFORMANCE STATUS: 0 - Asymptomatic  BP 122/70 (BP Location: Right Arm, Patient Position: Sitting)   Pulse 74   Temp (!) 96.6 F (35.9 C) (Tympanic)   Resp 16   Wt 100 lb 3.2 oz (45.5 kg)   BMI 17.75 kg/m   Filed Weights   01/08/17 0846  Weight: 100 lb 3.2 oz (45.5 kg)    GENERAL: frail-appearing elderly female patiett Alert, no distress and comfortable. Accompanied by her care giver/ daughter-in-law. Patient is walking. EYES: no pallor or icterus OROPHARYNX: no thrush or ulceration; good dentition  NECK: supple, no masses felt LYMPH:  no palpable lymphadenopathy in the cervical, axillary or inguinal regions LUNGS: clear to auscultation and  No wheeze or crackles HEART/CVS: regular rate & rhythm and no murmurs; no swelling of the legs. No tenderness.  ABDOMEN:abdomen soft, non-tender and normal bowel sounds Musculoskeletal:no cyanosis  of digits and no clubbing  PSYCH: alert & oriented x 3 with fluent speech NEURO: no focal motor/sensory deficits SKIN:  no rashes or significant lesions  LABORATORY DATA:  I have reviewed the data as listed    Component Value Date/Time   NA 134 (L) 01/08/2017 0805   NA 138 09/19/2014 1118   K 3.9 01/08/2017 0805   K 4.0 09/19/2014 1118   CL 102 01/08/2017 0805   CL 103 09/19/2014 1118   CO2 23 01/08/2017 0805   CO2 29 09/19/2014 1118   GLUCOSE 160 (H) 01/08/2017 0805   GLUCOSE 91 09/19/2014 1118   BUN 27 (H) 01/08/2017 0805   BUN 15 09/19/2014 1118   CREATININE 0.77 01/08/2017 0805   CREATININE 0.69 09/19/2014 1118   CALCIUM 8.7 (L) 01/08/2017 0805   CALCIUM 9.0 09/19/2014 1118   PROT 7.6 12/23/2016 0811   PROT 6.8 09/19/2014 1118   ALBUMIN 3.4 (L) 12/23/2016 0811   ALBUMIN 3.9 09/19/2014 1118   AST 24 12/23/2016 0811   AST 20 09/19/2014 1118   ALT 17 12/23/2016 0811   ALT 16 09/19/2014 1118   ALKPHOS 61 12/23/2016 0811   ALKPHOS 85 09/19/2014 1118   BILITOT 0.3 12/23/2016 0811   BILITOT 0.6 09/19/2014 1118   GFRNONAA >60 01/08/2017 0805   GFRNONAA >60 09/19/2014 1118   GFRAA >60 01/08/2017 0805   GFRAA >60 09/19/2014 1118    No results found for: SPEP, UPEP  Lab Results  Component Value Date   WBC 3.9 01/08/2017   NEUTROABS 2.8 01/08/2017   HGB 11.9 (L) 01/08/2017   HCT 34.7 (L) 01/08/2017   MCV 88.0 01/08/2017   PLT 197 01/08/2017      Chemistry      Component Value Date/Time   NA 134 (L) 01/08/2017 0805   NA 138 09/19/2014 1118   K 3.9 01/08/2017 0805   K 4.0 09/19/2014 1118   CL 102 01/08/2017 0805   CL 103 09/19/2014 1118   CO2 23 01/08/2017 0805   CO2 29 09/19/2014 1118   BUN 27 (H) 01/08/2017 0805   BUN 15 09/19/2014 1118   CREATININE 0.77 01/08/2017 0805   CREATININE 0.69 09/19/2014 1118      Component Value Date/Time   CALCIUM 8.7 (L) 01/08/2017 0805   CALCIUM 9.0 09/19/2014 1118   ALKPHOS 61 12/23/2016 6144  ALKPHOS 85 09/19/2014  1118   AST 24 12/23/2016 0811   AST 20 09/19/2014 1118   ALT 17 12/23/2016 0811   ALT 16 09/19/2014 1118   BILITOT 0.3 12/23/2016 0811   BILITOT 0.6 09/19/2014 1118      IMPRESSION: No FDG avid myelomatous lesions are evident.  Mildly hypermetabolic mediastinal and bilateral perihilar lymph nodes, likely reactive.   Electronically Signed   By: Julian Hy M.D.   On: 12/13/2016 11:41     RADIOGRAPHIC STUDIES: I have personally reviewed the radiological images as listed and agreed with the findings in the report. No results found.   ASSESSMENT & PLAN:  Multiple myeloma not having achieved remission [facility]) Multiple myeloma relapsed June  2018 M protein 1.6 g [slightly elevated/rising]; kappa [113] lambda light chain ratio= elevated; up to 8.7. Biochemical progression; currently with worsening anemia [stable 10]; july 10th PET no evidence of any uptake in the bones. Tolerating well dara cycle #1 day 1 and day 8.  # Proceed with cycle #1; day 15 daratumumab.  HOLD Pomalyst- sec to cytopenia.   #  Pre-medications/shingles prophylaxis steroids/Claritin/Singulair/tylenol/zantac.  Cut down dex to only 1 day  prior to infusion sec to insomnia.   # Right hip pain- ? Likely arthritic pain. PET-negative.  Continue fentanyl patch 25 and also using oxycodone 5 mg prn. New prescription given.  # confusion/delirium/ slow cognitive decline/history of falls -currently stable. on 0.5 mg respiridal qhs.  # Poor appetite/new prescription for Remeron given 30 mg daily at bedtime.    # 1 week/labs/infusion; 2 weeks/labs/infusion/MD.   Orders Placed This Encounter  Procedures  . Comprehensive metabolic panel    Standing Status:   Future    Standing Expiration Date:   01/08/2018   All questions were answered. The patient knows to call the clinic with any problems, questions or concerns.      Cammie Sickle, MD 01/08/2017 6:15 PM

## 2017-01-08 NOTE — Progress Notes (Signed)
Patient is here today for a follow up.  Patient reports having difficulties sleeping.  Patient would like oxycodone refill.

## 2017-01-08 NOTE — Assessment & Plan Note (Addendum)
Multiple myeloma relapsed June  2018 M protein 1.6 g [slightly elevated/rising]; kappa [113] lambda light chain ratio= elevated; up to 8.7. Biochemical progression; currently with worsening anemia [stable 10]; july 10th PET no evidence of any uptake in the bones. Tolerating well dara cycle #1 day 1 and day 8.  # Proceed with cycle #1; day 15 daratumumab.  HOLD Pomalyst- sec to cytopenia.   #  Pre-medications/shingles prophylaxis steroids/Claritin/Singulair/tylenol/zantac.  Cut down dex to only 1 day  prior to infusion sec to insomnia.   # Right hip pain- ? Likely arthritic pain. PET-negative.  Continue fentanyl patch 25 and also using oxycodone 5 mg prn. New prescription given.  # confusion/delirium/ slow cognitive decline/history of falls -currently stable. on 0.5 mg respiridal qhs.  # Poor appetite/new prescription for Remeron given 30 mg daily at bedtime.    # 1 week/labs/infusion; 2 weeks/labs/infusion/MD.

## 2017-01-09 ENCOUNTER — Other Ambulatory Visit: Payer: Self-pay | Admitting: *Deleted

## 2017-01-09 DIAGNOSIS — C9002 Multiple myeloma in relapse: Secondary | ICD-10-CM

## 2017-01-09 MED ORDER — POMALIDOMIDE 4 MG PO CAPS: ORAL_CAPSULE | ORAL | 0 refills | Status: DC

## 2017-01-09 NOTE — Progress Notes (Signed)
Received incoming fax from [facility] - pt due for pomalyst rf. V/o to rf rx - [facility]

## 2017-01-15 ENCOUNTER — Inpatient Hospital Stay: Payer: Medicare Other

## 2017-01-15 ENCOUNTER — Telehealth: Payer: Self-pay | Admitting: *Deleted

## 2017-01-15 VITALS — BP 151/83 | HR 73 | Temp 96.7°F | Resp 18

## 2017-01-15 DIAGNOSIS — C9002 Multiple myeloma in relapse: Secondary | ICD-10-CM | POA: Diagnosis not present

## 2017-01-15 DIAGNOSIS — Z8739 Personal history of other diseases of the musculoskeletal system and connective tissue: Secondary | ICD-10-CM | POA: Diagnosis not present

## 2017-01-15 DIAGNOSIS — Z5112 Encounter for antineoplastic immunotherapy: Secondary | ICD-10-CM | POA: Diagnosis not present

## 2017-01-15 DIAGNOSIS — F0390 Unspecified dementia without behavioral disturbance: Secondary | ICD-10-CM | POA: Diagnosis not present

## 2017-01-15 DIAGNOSIS — C9000 Multiple myeloma not having achieved remission: Secondary | ICD-10-CM

## 2017-01-15 DIAGNOSIS — Z923 Personal history of irradiation: Secondary | ICD-10-CM | POA: Diagnosis not present

## 2017-01-15 DIAGNOSIS — D649 Anemia, unspecified: Secondary | ICD-10-CM | POA: Diagnosis not present

## 2017-01-15 LAB — CBC WITH DIFFERENTIAL/PLATELET
Basophils Absolute: 0 10*3/uL (ref 0–0.1)
Basophils Relative: 0 %
Eosinophils Absolute: 0 10*3/uL (ref 0–0.7)
Eosinophils Relative: 0 %
HCT: 33.6 % — ABNORMAL LOW (ref 35.0–47.0)
Hemoglobin: 11.6 g/dL — ABNORMAL LOW (ref 12.0–16.0)
LYMPHS PCT: 17 %
Lymphs Abs: 0.7 10*3/uL — ABNORMAL LOW (ref 1.0–3.6)
MCH: 30.3 pg (ref 26.0–34.0)
MCHC: 34.7 g/dL (ref 32.0–36.0)
MCV: 87.5 fL (ref 80.0–100.0)
MONO ABS: 0.6 10*3/uL (ref 0.2–0.9)
MONOS PCT: 14 %
NEUTROS ABS: 2.7 10*3/uL (ref 1.4–6.5)
NEUTROS PCT: 69 %
PLATELETS: 208 10*3/uL (ref 150–440)
RBC: 3.83 MIL/uL (ref 3.80–5.20)
RDW: 16.7 % — AB (ref 11.5–14.5)
WBC: 3.9 10*3/uL (ref 3.6–11.0)

## 2017-01-15 LAB — COMPREHENSIVE METABOLIC PANEL
ALT: 14 U/L (ref 14–54)
AST: 18 U/L (ref 15–41)
Albumin: 3.2 g/dL — ABNORMAL LOW (ref 3.5–5.0)
Alkaline Phosphatase: 73 U/L (ref 38–126)
Anion gap: 10 (ref 5–15)
BUN: 24 mg/dL — ABNORMAL HIGH (ref 6–20)
CHLORIDE: 102 mmol/L (ref 101–111)
CO2: 24 mmol/L (ref 22–32)
CREATININE: 0.75 mg/dL (ref 0.44–1.00)
Calcium: 8.6 mg/dL — ABNORMAL LOW (ref 8.9–10.3)
Glucose, Bld: 101 mg/dL — ABNORMAL HIGH (ref 65–99)
POTASSIUM: 4.1 mmol/L (ref 3.5–5.1)
Sodium: 136 mmol/L (ref 135–145)
Total Bilirubin: 0.4 mg/dL (ref 0.3–1.2)
Total Protein: 6.6 g/dL (ref 6.5–8.1)

## 2017-01-15 MED ORDER — HEPARIN SOD (PORK) LOCK FLUSH 100 UNIT/ML IV SOLN
500.0000 [IU] | Freq: Once | INTRAVENOUS | Status: AC
  Administered 2017-01-15: 500 [IU] via INTRAVENOUS
  Filled 2017-01-15: qty 5

## 2017-01-15 MED ORDER — SODIUM CHLORIDE 0.9 % IV SOLN
Freq: Once | INTRAVENOUS | Status: AC
  Administered 2017-01-15: 09:00:00 via INTRAVENOUS
  Filled 2017-01-15: qty 1000

## 2017-01-15 MED ORDER — SODIUM CHLORIDE 0.9% FLUSH
10.0000 mL | INTRAVENOUS | Status: DC | PRN
  Administered 2017-01-15: 10 mL via INTRAVENOUS
  Filled 2017-01-15: qty 10

## 2017-01-15 MED ORDER — SODIUM CHLORIDE 0.9 % IV SOLN
800.0000 mg | Freq: Once | INTRAVENOUS | Status: AC
  Administered 2017-01-15: 800 mg via INTRAVENOUS
  Filled 2017-01-15: qty 40

## 2017-01-15 MED ORDER — DIPHENHYDRAMINE HCL 25 MG PO CAPS
50.0000 mg | ORAL_CAPSULE | Freq: Once | ORAL | Status: AC
  Administered 2017-01-15: 50 mg via ORAL
  Filled 2017-01-15: qty 2

## 2017-01-15 MED ORDER — LIDOCAINE-PRILOCAINE 2.5-2.5 % EX CREA
1.0000 | TOPICAL_CREAM | CUTANEOUS | 1 refills | Status: DC | PRN
Start: 2017-01-15 — End: 2017-09-01

## 2017-01-15 MED ORDER — ACETAMINOPHEN 325 MG PO TABS
650.0000 mg | ORAL_TABLET | Freq: Once | ORAL | Status: AC
  Administered 2017-01-15: 650 mg via ORAL
  Filled 2017-01-15: qty 2

## 2017-01-15 MED ORDER — DEXAMETHASONE SODIUM PHOSPHATE 100 MG/10ML IJ SOLN
20.0000 mg | Freq: Once | INTRAMUSCULAR | Status: AC
  Administered 2017-01-15: 20 mg via INTRAVENOUS
  Filled 2017-01-15: qty 2

## 2017-01-15 MED ORDER — PROCHLORPERAZINE MALEATE 10 MG PO TABS
10.0000 mg | ORAL_TABLET | Freq: Once | ORAL | Status: AC
  Administered 2017-01-15: 10 mg via ORAL
  Filled 2017-01-15: qty 1

## 2017-01-15 NOTE — Telephone Encounter (Signed)
It is okay to send in Rx for EMLA cream to use to prior to port access.

## 2017-01-15 NOTE — Telephone Encounter (Signed)
For lidocaine for port site. States it is getting sore from being used so frequently and she has not had any. Please advise if it is alright to order EMLA cream for her

## 2017-01-22 ENCOUNTER — Inpatient Hospital Stay: Payer: Medicare Other

## 2017-01-22 ENCOUNTER — Inpatient Hospital Stay (HOSPITAL_BASED_OUTPATIENT_CLINIC_OR_DEPARTMENT_OTHER): Payer: Medicare Other | Admitting: Internal Medicine

## 2017-01-22 VITALS — BP 127/84 | HR 80 | Temp 97.3°F | Resp 20 | Ht 63.0 in | Wt 101.0 lb

## 2017-01-22 DIAGNOSIS — E785 Hyperlipidemia, unspecified: Secondary | ICD-10-CM

## 2017-01-22 DIAGNOSIS — D649 Anemia, unspecified: Secondary | ICD-10-CM

## 2017-01-22 DIAGNOSIS — C9000 Multiple myeloma not having achieved remission: Secondary | ICD-10-CM

## 2017-01-22 DIAGNOSIS — Z9181 History of falling: Secondary | ICD-10-CM

## 2017-01-22 DIAGNOSIS — I10 Essential (primary) hypertension: Secondary | ICD-10-CM | POA: Diagnosis not present

## 2017-01-22 DIAGNOSIS — M1990 Unspecified osteoarthritis, unspecified site: Secondary | ICD-10-CM

## 2017-01-22 DIAGNOSIS — I2510 Atherosclerotic heart disease of native coronary artery without angina pectoris: Secondary | ICD-10-CM

## 2017-01-22 DIAGNOSIS — Z923 Personal history of irradiation: Secondary | ICD-10-CM | POA: Diagnosis not present

## 2017-01-22 DIAGNOSIS — R630 Anorexia: Secondary | ICD-10-CM | POA: Diagnosis not present

## 2017-01-22 DIAGNOSIS — M25551 Pain in right hip: Secondary | ICD-10-CM | POA: Diagnosis not present

## 2017-01-22 DIAGNOSIS — C9002 Multiple myeloma in relapse: Secondary | ICD-10-CM | POA: Diagnosis not present

## 2017-01-22 DIAGNOSIS — F0390 Unspecified dementia without behavioral disturbance: Secondary | ICD-10-CM | POA: Diagnosis not present

## 2017-01-22 DIAGNOSIS — F419 Anxiety disorder, unspecified: Secondary | ICD-10-CM

## 2017-01-22 DIAGNOSIS — I4891 Unspecified atrial fibrillation: Secondary | ICD-10-CM

## 2017-01-22 DIAGNOSIS — G8929 Other chronic pain: Secondary | ICD-10-CM

## 2017-01-22 DIAGNOSIS — Z8739 Personal history of other diseases of the musculoskeletal system and connective tissue: Secondary | ICD-10-CM

## 2017-01-22 DIAGNOSIS — Z79899 Other long term (current) drug therapy: Secondary | ICD-10-CM

## 2017-01-22 DIAGNOSIS — M549 Dorsalgia, unspecified: Secondary | ICD-10-CM | POA: Diagnosis not present

## 2017-01-22 DIAGNOSIS — Z5112 Encounter for antineoplastic immunotherapy: Secondary | ICD-10-CM | POA: Diagnosis not present

## 2017-01-22 LAB — CBC WITH DIFFERENTIAL/PLATELET
Basophils Absolute: 0 10*3/uL (ref 0–0.1)
Basophils Relative: 0 %
Eosinophils Absolute: 0 10*3/uL (ref 0–0.7)
Eosinophils Relative: 0 %
HEMATOCRIT: 34.9 % — AB (ref 35.0–47.0)
Hemoglobin: 11.9 g/dL — ABNORMAL LOW (ref 12.0–16.0)
LYMPHS ABS: 0.8 10*3/uL — AB (ref 1.0–3.6)
LYMPHS PCT: 16 %
MCH: 30.2 pg (ref 26.0–34.0)
MCHC: 34.1 g/dL (ref 32.0–36.0)
MCV: 88.6 fL (ref 80.0–100.0)
MONO ABS: 0.5 10*3/uL (ref 0.2–0.9)
MONOS PCT: 11 %
NEUTROS ABS: 3.4 10*3/uL (ref 1.4–6.5)
Neutrophils Relative %: 73 %
Platelets: 217 10*3/uL (ref 150–440)
RBC: 3.94 MIL/uL (ref 3.80–5.20)
RDW: 16.8 % — AB (ref 11.5–14.5)
WBC: 4.7 10*3/uL (ref 3.6–11.0)

## 2017-01-22 LAB — BASIC METABOLIC PANEL
ANION GAP: 9 (ref 5–15)
BUN: 28 mg/dL — ABNORMAL HIGH (ref 6–20)
CALCIUM: 8.5 mg/dL — AB (ref 8.9–10.3)
CO2: 24 mmol/L (ref 22–32)
CREATININE: 0.84 mg/dL (ref 0.44–1.00)
Chloride: 101 mmol/L (ref 101–111)
GFR calc Af Amer: >60 mL/min (ref >60)
GFR calc non Af Amer: 59 mL/min — ABNORMAL LOW (ref >60)
GLUCOSE: 132 mg/dL — AB (ref 65–99)
Potassium: 4.6 mmol/L (ref 3.5–5.1)
Sodium: 134 mmol/L — ABNORMAL LOW (ref 135–145)

## 2017-01-22 MED ORDER — HEPARIN SOD (PORK) LOCK FLUSH 100 UNIT/ML IV SOLN
500.0000 [IU] | Freq: Once | INTRAVENOUS | Status: AC
  Administered 2017-01-22: 500 [IU] via INTRAVENOUS
  Filled 2017-01-22: qty 5

## 2017-01-22 MED ORDER — SODIUM CHLORIDE 0.9% FLUSH
10.0000 mL | INTRAVENOUS | Status: DC | PRN
  Administered 2017-01-22: 10 mL via INTRAVENOUS
  Filled 2017-01-22: qty 10

## 2017-01-22 MED ORDER — ACETAMINOPHEN 325 MG PO TABS
650.0000 mg | ORAL_TABLET | Freq: Once | ORAL | Status: AC
  Administered 2017-01-22: 650 mg via ORAL
  Filled 2017-01-22: qty 2

## 2017-01-22 MED ORDER — PROCHLORPERAZINE MALEATE 10 MG PO TABS
10.0000 mg | ORAL_TABLET | Freq: Once | ORAL | Status: AC
  Administered 2017-01-22: 10 mg via ORAL
  Filled 2017-01-22: qty 1

## 2017-01-22 MED ORDER — SODIUM CHLORIDE 0.9 % IV SOLN
Freq: Once | INTRAVENOUS | Status: AC
  Administered 2017-01-22: 10:00:00 via INTRAVENOUS
  Filled 2017-01-22: qty 1000

## 2017-01-22 MED ORDER — SODIUM CHLORIDE 0.9 % IV SOLN
20.0000 mg | Freq: Once | INTRAVENOUS | Status: AC
  Administered 2017-01-22: 20 mg via INTRAVENOUS
  Filled 2017-01-22: qty 2

## 2017-01-22 MED ORDER — DIPHENHYDRAMINE HCL 25 MG PO CAPS
50.0000 mg | ORAL_CAPSULE | Freq: Once | ORAL | Status: AC
  Administered 2017-01-22: 50 mg via ORAL
  Filled 2017-01-22: qty 2

## 2017-01-22 MED ORDER — HEPARIN SOD (PORK) LOCK FLUSH 100 UNIT/ML IV SOLN
INTRAVENOUS | Status: AC
  Filled 2017-01-22: qty 5

## 2017-01-22 MED ORDER — DARATUMUMAB CHEMO INJECTION 400 MG/20ML
800.0000 mg | Freq: Once | INTRAVENOUS | Status: AC
  Administered 2017-01-22: 800 mg via INTRAVENOUS
  Filled 2017-01-22: qty 40

## 2017-01-22 NOTE — Progress Notes (Signed)
[facility] OFFICE PROGRESS NOTE  Patient Care Team: Derinda Late, MD as PCP - General (Family Medicine) Forest Gleason, MD (Oncology) Christene Lye, MD (General Surgery)  No matching staging information was found for the patient.   Oncology History   1. Stage III Multiple Myeloma a. 11/20/09: SPEP showed quantitative IgG 4567 mg/dL with M spike of 3.8 g/dL and IFE showing IgG monoclonal protein with light chain specificity.  [place] urine protein electrophoresis showed M spike 27% Bence Jones Protein.  a. 11/28/09: bone marrow biopsy: - PLASMA CELL DYSCRASIA CONSISTENT WITH MULTIPLE MYELOMA (55%PLASMA CELLS ON ASPIRATE, 75% BY CD138 IMMUNOHISTOCHEMISTRY,KAPPA RESTRICTED).TRILINEAGE HEMATOPOIESIS.46 XX,  Normal FISH panel. b. 11/29/09: bone survey: There are numerous, scattered 2 mm to 5 mm concentric  radiolucencies in the bony calvaria.   Two or three similar focal lucent areas in the right  femur.  There are observed changes consistent with disc disease at multiple levels of the cervical spine and multiple levels of the lumbar spine. c. 12/07/09: right humerus: Multiple tiny lucencies noted throughout the right humerus including humeral diaphysis. No fracture. d. 8/11- 4/12 Mephalan/prednisone/velcade X 6 cycles with decrease in Mspike to 1.0 gm/dl. e.osteonecrosis of jaw and diagnosed in December of 2013(Zometa was discontinued) 7.Patient was started on Velcade and Decadron   because of progressive multiple myeloma from January of 2013. 8. Started on Kyprolis 05/13 9.progressing disease on kyphorilis September of 2013 10 patient has been started on [name] daily for 21 days and one week off Decadron day1, 18 and 15   with 4 week cycle is finished palliative radiation therapy without much relief in pain(November, 2013) 11.Palliative radiation therapy to the right shoulder (December, 2014) 12 patient was on break from pomolidomide because of infection and it has  been presumed from December 05, 2014  # July 2017- skeletal survey- stable bone lesions.   # July 10th PET- No bone involvement  # July 16th 2018- Dara; stop Pom sec cytopenia.   ------------------------------------------------------  # History of osteonecrosis of the jaw- not on Zometa.     Multiple myeloma not having achieved remission ([place])     INTERVAL HISTORY: Patient is a fair historian at best.  [name] 83 y.o.  female pleasant patient With mild-moderate dementia and above history of Multiple myeloma currently daratumumab Status post cycle #1 day 22 is here for follow-up.  Patient tolerated infusions without any major reactions. Patient does have a burst of energy post treatments-from the steroids. Continues to have mild difficulty sleeping at night otherwise not significantly impairing her quality of life.  Has chronic back pain/pain not worse. She has been on fentanyl patch and oxycodone- may be once a day as needed. No nausea no vomiting. No skin rash. No diarrhea. No chest pain or shortness of breath or cough. Denies any tingling or numbness.   REVIEW OF SYSTEMS:  A complete 10 point review of system is done which is negative except mentioned above/history of present illness.   PAST MEDICAL HISTORY :  Past Medical History:  Diagnosis Date  . Anxiety   . Arthritis   . Atrial fibrillation ([place])   . Coronary arteriosclerosis   . History of blood transfusion 2014  . Hyperlipidemia   . Hypertension   . Multiple myeloma ([place])   . Multiple myeloma in relapse ([place]) 10/29/2014  . Osteonecrosis of jaw ([place])    left    PAST SURGICAL HISTORY :   Past Surgical History:  Procedure Laterality Date  .  ABDOMINAL HYSTERECTOMY    . APPENDECTOMY    . PARTIAL COLECTOMY    . TOTAL KNEE REVISION Left     FAMILY HISTORY :   Family History  Problem Relation Age of Onset  . Cancer Father   . Heart attack Mother     SOCIAL HISTORY:   Social History  Substance Use  Topics  . Smoking status: Never Smoker  . Smokeless tobacco: Never Used  . Alcohol use 1.2 oz/week    2 Glasses of wine per week    ALLERGIES:  is allergic to biaxin [clarithromycin]; demerol [meperidine]; hydrocodone; sulfa antibiotics; tramadol; and ultracet [tramadol-acetaminophen].  MEDICATIONS:  Current Outpatient Prescriptions  Medication Sig Dispense Refill  . acetaminophen (TYLENOL 8 HOUR) 650 MG CR tablet Take 650 mg by mouth. 1 tablet twice a day x 4 days. Start taking 2 days prior to [facility] treatment, on the day of treatment and day after treatment.    Marland Kitchen acyclovir (ZOVIRAX) 400 MG tablet One pill a day [to prevent shingles] 30 tablet 3  . Biotin 1 MG CAPS Take 1 capsule by mouth daily.     . Calcium-Magnesium-Vitamin D (CALCIUM 500 PO) Take 1 tablet by mouth daily.     Marland Kitchen dexamethasone (DECADRON) 4 MG tablet Take 1 tablet (4 mg total) by mouth 2 (two) times daily. Start 2 days prior to infusion; Take it for 4 days. (Patient taking differently: Take 4 mg by mouth 2 (two) times daily. Start 1 days prior to infusion) 60 tablet 3  . Docusate Sodium (COLACE PO) Take 2 tablets by mouth 2 (two) times daily.    . fentaNYL (DURAGESIC - DOSED MCG/HR) 25 MCG/HR patch Place 1 patch (25 mcg total) onto the skin every 3 (three) days. 10 patch 0  . ferrous sulfate 325 (65 FE) MG EC tablet Take 325 mg by mouth daily.    Marland Kitchen lidocaine-prilocaine (EMLA) cream Apply 1 application topically as needed. Apply small amount to port site at least 1 hour prior to it being accessed, cover with plastic wrap 30 g 1  . loratadine (CLARITIN) 10 MG tablet Take 10 mg by mouth. 1 tablet a day x 4 days. Start taking 2 days prior to [facility] treatment, on the day of treatment and day after treatment.    . mirtazapine (REMERON) 30 MG tablet Take 1 tablet (30 mg total) by mouth at bedtime. 60 tablet 2  . montelukast (SINGULAIR) 10 MG tablet Take 1 tablet (10 mg total) by mouth at bedtime. Start 2 days prior to infusion.  Take it for 4 days. 60 tablet 3  . Multiple Vitamins-Minerals (CENTRUM SILVER 50+WOMEN PO) Take 1 tablet by mouth daily.     Marland Kitchen oxyCODONE (OXY IR/ROXICODONE) 5 MG immediate release tablet Take 0.5 tablets (2.5 mg total) by mouth every 8 (eight) hours as needed for severe pain. 30 tablet 0  . polyethylene glycol (MIRALAX / GLYCOLAX) packet Take 17 g by mouth daily as needed for mild constipation.    . prochlorperazine (COMPAZINE) 10 MG tablet Take 1 tablet (10 mg total) by mouth every 8 (eight) hours as needed for nausea or vomiting. 30 tablet 1  . ranitidine (ZANTAC) 150 MG capsule Take 150 mg by mouth 2 (two) times daily. 1 tablet twice a day x 4 days. Start taking 2 days prior to [facility] treatment, on the day of treatment and day after treatment.    . risperiDONE (RISPERDAL) 0.25 MG tablet Take 2 tablets (0.5 mg total) by mouth at  bedtime. 60 tablet 3  . ondansetron (ZOFRAN) 4 MG tablet Take 1 tablet (4 mg total) by mouth every 8 (eight) hours as needed for nausea or vomiting. (Patient not taking: Reported on 01/22/2017) 20 tablet 0  . pomalidomide (POMALYST) 4 MG capsule TAKE 1 CAPSULE BY MOUTH DAILY FOR 14 DAYS ON FOLLOWED BY 14 DAYS OFF. (Patient not taking: Reported on 01/22/2017) 14 capsule 0   No current facility-administered medications for this visit.    Facility-Administered Medications Ordered in Other Visits  Medication Dose Route Frequency Provider Last Rate Last Dose  . heparin lock flush 100 unit/mL  500 Units Intravenous Once Charlaine Dalton R, MD      . sodium chloride 0.9 % injection 10 mL  10 mL Intravenous PRN Forest Gleason, MD   10 mL at 03/01/15 1305  . sodium chloride flush (NS) 0.9 % injection 10 mL  10 mL Intravenous PRN Cammie Sickle, MD   10 mL at 01/22/17 0818    PHYSICAL EXAMINATION: ECOG PERFORMANCE STATUS: 0 - Asymptomatic  BP 127/84 (BP Location: Right Arm, Patient Position: Sitting)   Pulse 80   Temp (!) 97.3 F (36.3 C) (Tympanic)   Resp 20   Ht  _0  (1.6 m)   Wt 101 lb (45.8 kg)   BMI 17.89 kg/m   Filed Weights   01/22/17 0838  Weight: 101 lb (45.8 kg)    GENERAL: frail-appearing elderly female patiett Alert, no distress and comfortable. Accompanied by her care giver/ daughter-in-law. Patient is walking. EYES: no pallor or icterus OROPHARYNX: no thrush or ulceration; good dentition  NECK: supple, no masses felt LYMPH:  no palpable lymphadenopathy in the cervical, axillary or inguinal regions LUNGS: clear to auscultation and  No wheeze or crackles HEART/CVS: regular rate & rhythm and no murmurs; no swelling of the legs. No tenderness.  ABDOMEN:abdomen soft, non-tender and normal bowel sounds Musculoskeletal:no cyanosis of digits and no clubbing  PSYCH: alert & oriented x 3 with fluent speech NEURO: no focal motor/sensory deficits SKIN:  no rashes or significant lesions  LABORATORY DATA:  I have reviewed the data as listed    Component Value Date/Time   NA 134 (L) 01/22/2017 0818   NA 138 09/19/2014 1118   K 4.6 01/22/2017 0818   K 4.0 09/19/2014 1118   CL 101 01/22/2017 0818   CL 103 09/19/2014 1118   CO2 24 01/22/2017 0818   CO2 29 09/19/2014 1118   GLUCOSE 132 (H) 01/22/2017 0818   GLUCOSE 91 09/19/2014 1118   BUN 28 (H) 01/22/2017 0818   BUN 15 09/19/2014 1118   CREATININE 0.84 01/22/2017 0818   CREATININE 0.69 09/19/2014 1118   CALCIUM 8.5 (L) 01/22/2017 0818   CALCIUM 9.0 09/19/2014 1118   PROT 6.6 01/15/2017 0807   PROT 6.8 09/19/2014 1118   ALBUMIN 3.2 (L) 01/15/2017 0807   ALBUMIN 3.9 09/19/2014 1118   AST 18 01/15/2017 0807   AST 20 09/19/2014 1118   ALT 14 01/15/2017 0807   ALT 16 09/19/2014 1118   ALKPHOS 73 01/15/2017 0807   ALKPHOS 85 09/19/2014 1118   BILITOT 0.4 01/15/2017 0807   BILITOT 0.6 09/19/2014 1118   GFRNONAA 59 (L) 01/22/2017 0818   GFRNONAA >60 09/19/2014 1118   GFRAA >60 01/22/2017 0818   GFRAA >60 09/19/2014 1118    No results found for: SPEP, UPEP  Lab Results   Component Value Date   WBC 4.7 01/22/2017   NEUTROABS 3.4 01/22/2017   HGB 11.9 (  L) 01/22/2017   HCT 34.9 (L) 01/22/2017   MCV 88.6 01/22/2017   PLT 217 01/22/2017      Chemistry      Component Value Date/Time   NA 134 (L) 01/22/2017 0818   NA 138 09/19/2014 1118   K 4.6 01/22/2017 0818   K 4.0 09/19/2014 1118   CL 101 01/22/2017 0818   CL 103 09/19/2014 1118   CO2 24 01/22/2017 0818   CO2 29 09/19/2014 1118   BUN 28 (H) 01/22/2017 0818   BUN 15 09/19/2014 1118   CREATININE 0.84 01/22/2017 0818   CREATININE 0.69 09/19/2014 1118      Component Value Date/Time   CALCIUM 8.5 (L) 01/22/2017 0818   CALCIUM 9.0 09/19/2014 1118   ALKPHOS 73 01/15/2017 0807   ALKPHOS 85 09/19/2014 1118   AST 18 01/15/2017 0807   AST 20 09/19/2014 1118   ALT 14 01/15/2017 0807   ALT 16 09/19/2014 1118   BILITOT 0.4 01/15/2017 0807   BILITOT 0.6 09/19/2014 1118      IMPRESSION: No FDG avid myelomatous lesions are evident.  Mildly hypermetabolic mediastinal and bilateral perihilar lymph nodes, likely reactive.   Electronically Signed   By: Julian Hy M.D.   On: 12/13/2016 11:41     RADIOGRAPHIC STUDIES: I have personally reviewed the radiological images as listed and agreed with the findings in the report. No results found.   ASSESSMENT & PLAN:  Multiple myeloma not having achieved remission [facility]) Multiple myeloma relapsed June  2018 M protein 1.6 g [slightly elevated/rising]; kappa [113] lambda light chain ratio= elevated; up to 8.7. Biochemical progression; currently with worsening anemia [stable 10]; july 10th PET no evidence of any uptake in the bones. Tolerating well dara cycle #1 day-22.  # Proceed with cycle #2; day 1 daratumumab.  Labs acceptable. Anemia improving with hemoglobin 11.  #  Pre-medications/shingles prophylaxis steroids/Claritin/Singulair/tylenol/zantac.   # Right hip pain- ? Likely arthritic pain. PET-negative.  Continue fentanyl patch 25 and  also using oxycodone 5 mg as needed twice a day.   # confusion/delirium/ slow cognitive decline/history of falls -currently stable. on 0.5 mg respiridal qhs.  # Poor appetite/new prescription for Remeron given 30 mg daily at bedtime.   # 1 week/labs/infusion; 2 weeks/labs/infusion/MD- Myeloma panel.  Orders Placed This Encounter  Procedures  . Multiple Myeloma Panel (SPEP&IFE w/QIG)    Standing Status:   Future    Standing Expiration Date:   01/22/2018  . Kappa/lambda light chains    Standing Status:   Future    Standing Expiration Date:   01/22/2018  . CBC with Differential/Platelet    Standing Status:   Future    Standing Expiration Date:   01/22/2018  . Comprehensive metabolic panel    Standing Status:   Future    Standing Expiration Date:   01/22/2018   All questions were answered. The patient knows to call the clinic with any problems, questions or concerns.      Cammie Sickle, MD 01/22/2017 11:36 AM

## 2017-01-22 NOTE — Assessment & Plan Note (Addendum)
Multiple myeloma relapsed June  2018 M protein 1.6 g [slightly elevated/rising]; kappa [113] lambda light chain ratio= elevated; up to 8.7. Biochemical progression; currently with worsening anemia [stable 10]; july 10th PET no evidence of any uptake in the bones. Tolerating well dara cycle #1 day-22.  # Proceed with cycle #2; day 1 daratumumab.  Labs acceptable. Anemia improving with hemoglobin 11.  #  Pre-medications/shingles prophylaxis steroids/Claritin/Singulair/tylenol/zantac.   # Right hip pain- ? Likely arthritic pain. PET-negative.  Continue fentanyl patch 25 and also using oxycodone 5 mg as needed twice a day.   # confusion/delirium/ slow cognitive decline/history of falls -currently stable. on 0.5 mg respiridal qhs.  # Poor appetite/new prescription for Remeron given 30 mg daily at bedtime.   # 1 week/labs/infusion; 2 weeks/labs/infusion/MD- Myeloma panel.

## 2017-01-29 ENCOUNTER — Inpatient Hospital Stay: Payer: Medicare Other

## 2017-01-29 VITALS — BP 153/76 | HR 80 | Temp 96.0°F | Resp 20

## 2017-01-29 DIAGNOSIS — D649 Anemia, unspecified: Secondary | ICD-10-CM | POA: Diagnosis not present

## 2017-01-29 DIAGNOSIS — F0390 Unspecified dementia without behavioral disturbance: Secondary | ICD-10-CM | POA: Diagnosis not present

## 2017-01-29 DIAGNOSIS — Z8739 Personal history of other diseases of the musculoskeletal system and connective tissue: Secondary | ICD-10-CM | POA: Diagnosis not present

## 2017-01-29 DIAGNOSIS — C9000 Multiple myeloma not having achieved remission: Secondary | ICD-10-CM

## 2017-01-29 DIAGNOSIS — Z5112 Encounter for antineoplastic immunotherapy: Secondary | ICD-10-CM | POA: Diagnosis not present

## 2017-01-29 DIAGNOSIS — C9002 Multiple myeloma in relapse: Secondary | ICD-10-CM | POA: Diagnosis not present

## 2017-01-29 DIAGNOSIS — Z923 Personal history of irradiation: Secondary | ICD-10-CM | POA: Diagnosis not present

## 2017-01-29 LAB — CBC WITH DIFFERENTIAL/PLATELET
Basophils Absolute: 0.1 10*3/uL (ref 0–0.1)
Basophils Relative: 2 %
EOS PCT: 0 %
Eosinophils Absolute: 0 10*3/uL (ref 0–0.7)
HCT: 34.9 % — ABNORMAL LOW (ref 35.0–47.0)
Hemoglobin: 12.1 g/dL (ref 12.0–16.0)
LYMPHS ABS: 0.9 10*3/uL — AB (ref 1.0–3.6)
LYMPHS PCT: 22 %
MCH: 30.4 pg (ref 26.0–34.0)
MCHC: 34.6 g/dL (ref 32.0–36.0)
MCV: 88.1 fL (ref 80.0–100.0)
MONO ABS: 0.4 10*3/uL (ref 0.2–0.9)
Monocytes Relative: 10 %
Neutro Abs: 2.7 10*3/uL (ref 1.4–6.5)
Neutrophils Relative %: 66 %
PLATELETS: 211 10*3/uL (ref 150–440)
RBC: 3.97 MIL/uL (ref 3.80–5.20)
RDW: 16.7 % — AB (ref 11.5–14.5)
WBC: 4 10*3/uL (ref 3.6–11.0)

## 2017-01-29 LAB — BASIC METABOLIC PANEL
Anion gap: 9 (ref 5–15)
BUN: 23 mg/dL — AB (ref 6–20)
CALCIUM: 8.7 mg/dL — AB (ref 8.9–10.3)
CO2: 23 mmol/L (ref 22–32)
CREATININE: 0.73 mg/dL (ref 0.44–1.00)
Chloride: 103 mmol/L (ref 101–111)
GFR calc Af Amer: >60 mL/min (ref >60)
GLUCOSE: 173 mg/dL — AB (ref 65–99)
Potassium: 3.9 mmol/L (ref 3.5–5.1)
Sodium: 135 mmol/L (ref 135–145)

## 2017-01-29 MED ORDER — HEPARIN SOD (PORK) LOCK FLUSH 100 UNIT/ML IV SOLN
500.0000 [IU] | Freq: Once | INTRAVENOUS | Status: AC
  Administered 2017-01-29: 500 [IU] via INTRAVENOUS
  Filled 2017-01-29: qty 5

## 2017-01-29 MED ORDER — SODIUM CHLORIDE 0.9% FLUSH
10.0000 mL | Freq: Once | INTRAVENOUS | Status: AC
Start: 2017-01-29 — End: 2017-01-29
  Administered 2017-01-29: 10 mL via INTRAVENOUS
  Filled 2017-01-29: qty 10

## 2017-01-29 MED ORDER — SODIUM CHLORIDE 0.9 % IV SOLN
Freq: Once | INTRAVENOUS | Status: AC
  Administered 2017-01-29: 10:00:00 via INTRAVENOUS
  Filled 2017-01-29: qty 1000

## 2017-01-29 MED ORDER — DIPHENHYDRAMINE HCL 25 MG PO CAPS
50.0000 mg | ORAL_CAPSULE | Freq: Once | ORAL | Status: AC
Start: 2017-01-29 — End: 2017-01-29
  Administered 2017-01-29: 50 mg via ORAL
  Filled 2017-01-29: qty 2

## 2017-01-29 MED ORDER — SODIUM CHLORIDE 0.9 % IV SOLN
800.0000 mg | Freq: Once | INTRAVENOUS | Status: AC
  Administered 2017-01-29: 800 mg via INTRAVENOUS
  Filled 2017-01-29: qty 40

## 2017-01-29 MED ORDER — ACETAMINOPHEN 325 MG PO TABS
650.0000 mg | ORAL_TABLET | Freq: Once | ORAL | Status: AC
  Administered 2017-01-29: 650 mg via ORAL
  Filled 2017-01-29: qty 2

## 2017-01-29 MED ORDER — SODIUM CHLORIDE 0.9 % IV SOLN
20.0000 mg | Freq: Once | INTRAVENOUS | Status: AC
  Administered 2017-01-29: 20 mg via INTRAVENOUS
  Filled 2017-01-29: qty 2

## 2017-01-29 MED ORDER — PROCHLORPERAZINE MALEATE 10 MG PO TABS
10.0000 mg | ORAL_TABLET | Freq: Once | ORAL | Status: AC
Start: 2017-01-29 — End: 2017-01-29
  Administered 2017-01-29: 10 mg via ORAL
  Filled 2017-01-29: qty 1

## 2017-02-05 ENCOUNTER — Inpatient Hospital Stay (HOSPITAL_BASED_OUTPATIENT_CLINIC_OR_DEPARTMENT_OTHER): Payer: Medicare Other | Admitting: Internal Medicine

## 2017-02-05 ENCOUNTER — Inpatient Hospital Stay: Payer: Medicare Other

## 2017-02-05 VITALS — BP 124/76 | HR 75 | Temp 98.6°F | Resp 16 | Wt 102.1 lb

## 2017-02-05 DIAGNOSIS — Z5112 Encounter for antineoplastic immunotherapy: Secondary | ICD-10-CM | POA: Diagnosis not present

## 2017-02-05 DIAGNOSIS — M549 Dorsalgia, unspecified: Secondary | ICD-10-CM | POA: Diagnosis not present

## 2017-02-05 DIAGNOSIS — C9000 Multiple myeloma not having achieved remission: Secondary | ICD-10-CM

## 2017-02-05 DIAGNOSIS — F419 Anxiety disorder, unspecified: Secondary | ICD-10-CM

## 2017-02-05 DIAGNOSIS — E785 Hyperlipidemia, unspecified: Secondary | ICD-10-CM | POA: Diagnosis not present

## 2017-02-05 DIAGNOSIS — D649 Anemia, unspecified: Secondary | ICD-10-CM | POA: Diagnosis not present

## 2017-02-05 DIAGNOSIS — F0390 Unspecified dementia without behavioral disturbance: Secondary | ICD-10-CM

## 2017-02-05 DIAGNOSIS — M25551 Pain in right hip: Secondary | ICD-10-CM | POA: Diagnosis not present

## 2017-02-05 DIAGNOSIS — Z8739 Personal history of other diseases of the musculoskeletal system and connective tissue: Secondary | ICD-10-CM | POA: Diagnosis not present

## 2017-02-05 DIAGNOSIS — R630 Anorexia: Secondary | ICD-10-CM

## 2017-02-05 DIAGNOSIS — M1990 Unspecified osteoarthritis, unspecified site: Secondary | ICD-10-CM

## 2017-02-05 DIAGNOSIS — I2510 Atherosclerotic heart disease of native coronary artery without angina pectoris: Secondary | ICD-10-CM

## 2017-02-05 DIAGNOSIS — Z923 Personal history of irradiation: Secondary | ICD-10-CM | POA: Diagnosis not present

## 2017-02-05 DIAGNOSIS — Z9181 History of falling: Secondary | ICD-10-CM | POA: Diagnosis not present

## 2017-02-05 DIAGNOSIS — Z79899 Other long term (current) drug therapy: Secondary | ICD-10-CM

## 2017-02-05 DIAGNOSIS — I10 Essential (primary) hypertension: Secondary | ICD-10-CM

## 2017-02-05 DIAGNOSIS — G8929 Other chronic pain: Secondary | ICD-10-CM

## 2017-02-05 DIAGNOSIS — I4891 Unspecified atrial fibrillation: Secondary | ICD-10-CM

## 2017-02-05 DIAGNOSIS — C9002 Multiple myeloma in relapse: Secondary | ICD-10-CM

## 2017-02-05 LAB — CBC WITH DIFFERENTIAL/PLATELET
BASOS PCT: 1 %
Basophils Absolute: 0 10*3/uL (ref 0–0.1)
EOS ABS: 0 10*3/uL (ref 0–0.7)
EOS PCT: 0 %
HCT: 34 % — ABNORMAL LOW (ref 35.0–47.0)
Hemoglobin: 11.8 g/dL — ABNORMAL LOW (ref 12.0–16.0)
LYMPHS ABS: 1.1 10*3/uL (ref 1.0–3.6)
Lymphocytes Relative: 25 %
MCH: 30.3 pg (ref 26.0–34.0)
MCHC: 34.7 g/dL (ref 32.0–36.0)
MCV: 87.3 fL (ref 80.0–100.0)
MONO ABS: 0.5 10*3/uL (ref 0.2–0.9)
MONOS PCT: 12 %
NEUTROS PCT: 62 %
Neutro Abs: 2.8 10*3/uL (ref 1.4–6.5)
Platelets: 196 10*3/uL (ref 150–440)
RBC: 3.9 MIL/uL (ref 3.80–5.20)
RDW: 16.4 % — AB (ref 11.5–14.5)
WBC: 4.5 10*3/uL (ref 3.6–11.0)

## 2017-02-05 LAB — COMPREHENSIVE METABOLIC PANEL
ALBUMIN: 3.2 g/dL — AB (ref 3.5–5.0)
ALT: 28 U/L (ref 14–54)
ANION GAP: 6 (ref 5–15)
AST: 29 U/L (ref 15–41)
Alkaline Phosphatase: 113 U/L (ref 38–126)
BUN: 22 mg/dL — ABNORMAL HIGH (ref 6–20)
CALCIUM: 8.5 mg/dL — AB (ref 8.9–10.3)
CO2: 28 mmol/L (ref 22–32)
Chloride: 100 mmol/L — ABNORMAL LOW (ref 101–111)
Creatinine, Ser: 0.71 mg/dL (ref 0.44–1.00)
GFR calc non Af Amer: >60 mL/min (ref >60)
GLUCOSE: 106 mg/dL — AB (ref 65–99)
Potassium: 4 mmol/L (ref 3.5–5.1)
SODIUM: 134 mmol/L — AB (ref 135–145)
TOTAL PROTEIN: 6.7 g/dL (ref 6.5–8.1)
Total Bilirubin: 0.3 mg/dL (ref 0.3–1.2)

## 2017-02-05 MED ORDER — PROCHLORPERAZINE MALEATE 10 MG PO TABS
10.0000 mg | ORAL_TABLET | Freq: Once | ORAL | Status: AC
  Administered 2017-02-05: 10 mg via ORAL
  Filled 2017-02-05: qty 1

## 2017-02-05 MED ORDER — DIPHENHYDRAMINE HCL 25 MG PO CAPS
50.0000 mg | ORAL_CAPSULE | Freq: Once | ORAL | Status: AC
  Administered 2017-02-05: 50 mg via ORAL
  Filled 2017-02-05: qty 2

## 2017-02-05 MED ORDER — SODIUM CHLORIDE 0.9 % IV SOLN
20.0000 mg | Freq: Once | INTRAVENOUS | Status: AC
  Administered 2017-02-05: 20 mg via INTRAVENOUS
  Filled 2017-02-05: qty 2

## 2017-02-05 MED ORDER — HEPARIN SOD (PORK) LOCK FLUSH 100 UNIT/ML IV SOLN
500.0000 [IU] | Freq: Once | INTRAVENOUS | Status: AC | PRN
  Administered 2017-02-05: 500 [IU]
  Filled 2017-02-05 (×2): qty 5

## 2017-02-05 MED ORDER — OXYCODONE HCL 5 MG PO TABS: 2.5000 mg | ORAL_TABLET | Freq: Three times a day (TID) | ORAL | 0 refills | Status: DC | PRN

## 2017-02-05 MED ORDER — FENTANYL 25 MCG/HR TD PT72: 25.0000 ug | MEDICATED_PATCH | TRANSDERMAL | 0 refills | Status: DC

## 2017-02-05 MED ORDER — SODIUM CHLORIDE 0.9 % IV SOLN
800.0000 mg | Freq: Once | INTRAVENOUS | Status: AC
  Administered 2017-02-05: 800 mg via INTRAVENOUS
  Filled 2017-02-05: qty 40

## 2017-02-05 MED ORDER — SODIUM CHLORIDE 0.9% FLUSH
10.0000 mL | INTRAVENOUS | Status: DC | PRN
  Administered 2017-02-05: 10 mL
  Filled 2017-02-05: qty 10

## 2017-02-05 MED ORDER — SODIUM CHLORIDE 0.9 % IV SOLN
Freq: Once | INTRAVENOUS | Status: AC
  Administered 2017-02-05: 10:00:00 via INTRAVENOUS
  Filled 2017-02-05: qty 1000

## 2017-02-05 MED ORDER — ACETAMINOPHEN 325 MG PO TABS
650.0000 mg | ORAL_TABLET | Freq: Once | ORAL | Status: AC
  Administered 2017-02-05: 650 mg via ORAL
  Filled 2017-02-05: qty 2

## 2017-02-05 NOTE — Assessment & Plan Note (Addendum)
Multiple myeloma relapsed June  2018 M protein 1.6 g [slightly elevated/rising]; kappa [113] lambda light chain ratio= elevated; up to 8.7. Biochemical progression; currently with worsening anemia [stable 10]; july 10th PET no evidence of any uptake in the bones. Tolerating well; s/p dara cycle #2 day-8  # Proceed with cycle #2; day 15 daratumumab.  Labs acceptable. Anemia improving with hemoglobin 11.  #  Pre-medications/shingles prophylaxis steroids/Claritin/Singulair/tylenol/zantac.   # Right hip pain- ? Likely arthritic pain. Continue fentanyl patch 25 and also using oxycodone 5 mg as needed twice a day. New scripts given.   # confusion/delirium-question worsening as per family. on 0.5 mg respiridal qhs. We will try to space out the treatments if possible; while awaiting on myeloma panel.  # Poor appetite/new prescription for Remeron given 30 mg daily at bedtime.   # 1 week/labs/infusion; 2 weeks/labs/infusion/MD.

## 2017-02-05 NOTE — Telephone Encounter (Signed)
See telephone note.

## 2017-02-05 NOTE — Progress Notes (Signed)
Patient is here today for a follow up. Patient reports no new concerns today.  

## 2017-02-05 NOTE — Progress Notes (Signed)
[name]  Patient Care Team: Derinda Late, MD as PCP - General (Family Medicine) Forest Gleason, MD (Oncology) Christene Lye, MD (General Surgery)  No matching staging information was found for the patient.   Oncology History   1. Stage III Multiple Myeloma a. 11/20/09: SPEP showed quantitative IgG 4567 mg/dL with M spike of 3.8 g/dL and IFE showing IgG monoclonal protein with light chain specificity.  [place] urine protein electrophoresis showed M spike 27% Bence Jones Protein.  a. 11/28/09: bone marrow biopsy: - PLASMA CELL DYSCRASIA CONSISTENT WITH MULTIPLE MYELOMA (55%PLASMA CELLS ON ASPIRATE, 75% BY CD138 IMMUNOHISTOCHEMISTRY,KAPPA RESTRICTED).TRILINEAGE HEMATOPOIESIS.46 XX,  Normal FISH panel. b. 11/29/09: bone survey: There are numerous, scattered 2 mm to 5 mm concentric  radiolucencies in the bony calvaria.   Two or three similar focal lucent areas in the right  femur.  There are observed changes consistent with disc disease at multiple levels of the cervical spine and multiple levels of the lumbar spine. c. 12/07/09: right humerus: Multiple tiny lucencies noted throughout the right humerus including humeral diaphysis. No fracture. d. 8/11- 4/12 Mephalan/prednisone/velcade X 6 cycles with decrease in Mspike to 1.0 gm/dl. e.osteonecrosis of jaw and diagnosed in December of 2013(Zometa was discontinued) 7.Patient was started on Velcade and Decadron   because of progressive multiple myeloma from January of 2013. 8. Started on Kyprolis 05/13 9.progressing disease on kyphorilis September of 2013 10 patient has been started on [name] daily for 21 days and one week off Decadron day1, 18 and 15   with 4 week cycle is finished palliative radiation therapy without much relief in pain(November, 2013) 11.Palliative radiation therapy to the right shoulder (December, 2014) 12 patient was on break from pomolidomide because of infection and it has  been presumed from December 05, 2014  # July 2017- skeletal survey- stable bone lesions.   # July 10th PET- No bone involvement  # July 16th 2018- Dara; stop Pom sec cytopenia.   ------------------------------------------------------  # History of osteonecrosis of the jaw- not on Zometa.     Multiple myeloma not having achieved remission ([place])     INTERVAL HISTORY: Patient is a fair historian at best.  [name] 83 y.o.  female pleasant patient With mild-moderate dementia and above history of Multiple myeloma currently daratumumab Status post cycle #1 day 22 is here for follow-up.  Patient has intermittent episodes of confusion; which seem to be worsening over time. However no falls. No worsening pain.   She has been on fentanyl patch and oxycodone- may be once a day as needed. No nausea no vomiting. No skin rash. No diarrhea. No chest pain or shortness of breath or cough. Denies any tingling or numbness.   REVIEW OF SYSTEMS:  A complete 10 point review of system is done which is negative except mentioned above/history of present illness.   PAST MEDICAL HISTORY :  Past Medical History:  Diagnosis Date  . Anxiety   . Arthritis   . Atrial fibrillation ([place])   . Coronary arteriosclerosis   . History of blood transfusion 2014  . Hyperlipidemia   . Hypertension   . Multiple myeloma ([place])   . Multiple myeloma in relapse ([place]) 10/29/2014  . Osteonecrosis of jaw ([place])    left    PAST SURGICAL HISTORY :   Past Surgical History:  Procedure Laterality Date  . ABDOMINAL HYSTERECTOMY    . APPENDECTOMY    . PARTIAL COLECTOMY    . TOTAL KNEE  REVISION Left     FAMILY HISTORY :   Family History  Problem Relation Age of Onset  . Cancer Father   . Heart attack Mother     SOCIAL HISTORY:   Social History  Substance Use Topics  . Smoking status: Never Smoker  . Smokeless tobacco: Never Used  . Alcohol use 1.2 oz/week    2 Glasses of wine per week    ALLERGIES:  is  allergic to biaxin [clarithromycin]; demerol [meperidine]; hydrocodone; sulfa antibiotics; tramadol; and ultracet [tramadol-acetaminophen].  MEDICATIONS:  Current Outpatient Prescriptions  Medication Sig Dispense Refill  . acetaminophen (TYLENOL 8 HOUR) 650 MG CR tablet Take 650 mg by mouth. 1 tablet twice a day x 4 days. Start taking 2 days prior to [facility] treatment, on the day of treatment and day after treatment.    Marland Kitchen acyclovir (ZOVIRAX) 400 MG tablet One pill a day [to prevent shingles] 30 tablet 3  . Biotin 1 MG CAPS Take 1 capsule by mouth daily.     . Calcium-Magnesium-Vitamin D (CALCIUM 500 PO) Take 1 tablet by mouth daily.     Marland Kitchen dexamethasone (DECADRON) 4 MG tablet Take 1 tablet (4 mg total) by mouth 2 (two) times daily. Start 2 days prior to infusion; Take it for 4 days. (Patient taking differently: Take 4 mg by mouth 2 (two) times daily. Start 1 days prior to infusion) 60 tablet 3  . Docusate Sodium (COLACE PO) Take 2 tablets by mouth 2 (two) times daily.    . fentaNYL (DURAGESIC - DOSED MCG/HR) 25 MCG/HR patch Place 1 patch (25 mcg total) onto the skin every 3 (three) days. 10 patch 0  . ferrous sulfate 325 (65 FE) MG EC tablet Take 325 mg by mouth daily.    Marland Kitchen lidocaine-prilocaine (EMLA) cream Apply 1 application topically as needed. Apply small amount to port site at least 1 hour prior to it being accessed, cover with plastic wrap 30 g 1  . loratadine (CLARITIN) 10 MG tablet Take 10 mg by mouth. 1 tablet a day x 4 days. Start taking 2 days prior to [facility] treatment, on the day of treatment and day after treatment.    . mirtazapine (REMERON) 30 MG tablet Take 1 tablet (30 mg total) by mouth at bedtime. 60 tablet 2  . montelukast (SINGULAIR) 10 MG tablet Take 1 tablet (10 mg total) by mouth at bedtime. Start 2 days prior to infusion. Take it for 4 days. 60 tablet 3  . Multiple Vitamins-Minerals (CENTRUM SILVER 50+WOMEN PO) Take 1 tablet by mouth daily.     . ondansetron (ZOFRAN) 4 MG  tablet Take 1 tablet (4 mg total) by mouth every 8 (eight) hours as needed for nausea or vomiting. 20 tablet 0  . oxyCODONE (OXY IR/ROXICODONE) 5 MG immediate release tablet Take 0.5 tablets (2.5 mg total) by mouth every 8 (eight) hours as needed for severe pain. 30 tablet 0  . polyethylene glycol (MIRALAX / GLYCOLAX) packet Take 17 g by mouth daily as needed for mild constipation.    . prochlorperazine (COMPAZINE) 10 MG tablet Take 1 tablet (10 mg total) by mouth every 8 (eight) hours as needed for nausea or vomiting. 30 tablet 1  . ranitidine (ZANTAC) 150 MG capsule Take 150 mg by mouth 2 (two) times daily. 1 tablet twice a day x 4 days. Start taking 2 days prior to [facility] treatment, on the day of treatment and day after treatment.    . risperiDONE (RISPERDAL) 0.25 MG tablet  Take 2 tablets (0.5 mg total) by mouth at bedtime. 60 tablet 3  . pomalidomide (POMALYST) 4 MG capsule TAKE 1 CAPSULE BY MOUTH DAILY FOR 14 DAYS ON FOLLOWED BY 14 DAYS OFF. (Patient not taking: Reported on 02/05/2017) 14 capsule 0   No current facility-administered medications for this visit.    Facility-Administered Medications Ordered in Other Visits  Medication Dose Route Frequency Provider Last Rate Last Dose  . sodium chloride 0.9 % injection 10 mL  10 mL Intravenous PRN Forest Gleason, MD   10 mL at 03/01/15 1305  . sodium chloride flush (NS) 0.9 % injection 10 mL  10 mL Intracatheter PRN Cammie Sickle, MD   10 mL at 02/05/17 0830    PHYSICAL EXAMINATION: ECOG PERFORMANCE STATUS: 0 - Asymptomatic  BP 124/76 (BP Location: Left Arm, Patient Position: Sitting)   Pulse 75   Temp 98.6 F (37 C)   Resp 16   Wt 102 lb 1.6 oz (46.3 kg)   BMI 18.09 kg/m   Filed Weights   02/05/17 0850  Weight: 102 lb 1.6 oz (46.3 kg)    GENERAL: frail-appearing elderly female patiett Alert, no distress and comfortable. Accompanied by her care giver/ daughter-in-law. Patient is walking. EYES: no pallor or  icterus OROPHARYNX: no thrush or ulceration; good dentition  NECK: supple, no masses felt LYMPH:  no palpable lymphadenopathy in the cervical, axillary or inguinal regions LUNGS: clear to auscultation and  No wheeze or crackles HEART/CVS: regular rate & rhythm and no murmurs; no swelling of the legs. No tenderness.  ABDOMEN:abdomen soft, non-tender and normal bowel sounds Musculoskeletal:no cyanosis of digits and no clubbing  PSYCH: alert & oriented x 3 with fluent speech NEURO: no focal motor/sensory deficits SKIN:  no rashes or significant lesions  LABORATORY DATA:  I have reviewed the data as listed    Component Value Date/Time   NA 134 (L) 02/05/2017 0824   NA 138 09/19/2014 1118   K 4.0 02/05/2017 0824   K 4.0 09/19/2014 1118   CL 100 (L) 02/05/2017 0824   CL 103 09/19/2014 1118   CO2 28 02/05/2017 0824   CO2 29 09/19/2014 1118   GLUCOSE 106 (H) 02/05/2017 0824   GLUCOSE 91 09/19/2014 1118   BUN 22 (H) 02/05/2017 0824   BUN 15 09/19/2014 1118   CREATININE 0.71 02/05/2017 0824   CREATININE 0.69 09/19/2014 1118   CALCIUM 8.5 (L) 02/05/2017 0824   CALCIUM 9.0 09/19/2014 1118   PROT 6.7 02/05/2017 0824   PROT 6.8 09/19/2014 1118   ALBUMIN 3.2 (L) 02/05/2017 0824   ALBUMIN 3.9 09/19/2014 1118   AST 29 02/05/2017 0824   AST 20 09/19/2014 1118   ALT 28 02/05/2017 0824   ALT 16 09/19/2014 1118   ALKPHOS 113 02/05/2017 0824   ALKPHOS 85 09/19/2014 1118   BILITOT 0.3 02/05/2017 0824   BILITOT 0.6 09/19/2014 1118   GFRNONAA >60 02/05/2017 0824   GFRNONAA >60 09/19/2014 1118   GFRAA >60 02/05/2017 0824   GFRAA >60 09/19/2014 1118    No results found for: SPEP, UPEP  Lab Results  Component Value Date   WBC 4.5 02/05/2017   NEUTROABS 2.8 02/05/2017   HGB 11.8 (L) 02/05/2017   HCT 34.0 (L) 02/05/2017   MCV 87.3 02/05/2017   PLT 196 02/05/2017      Chemistry      Component Value Date/Time   NA 134 (L) 02/05/2017 0824   NA 138 09/19/2014 1118   K 4.0 02/05/2017  0824   K 4.0 09/19/2014 1118   CL 100 (L) 02/05/2017 0824   CL 103 09/19/2014 1118   CO2 28 02/05/2017 0824   CO2 29 09/19/2014 1118   BUN 22 (H) 02/05/2017 0824   BUN 15 09/19/2014 1118   CREATININE 0.71 02/05/2017 0824   CREATININE 0.69 09/19/2014 1118      Component Value Date/Time   CALCIUM 8.5 (L) 02/05/2017 0824   CALCIUM 9.0 09/19/2014 1118   ALKPHOS 113 02/05/2017 0824   ALKPHOS 85 09/19/2014 1118   AST 29 02/05/2017 0824   AST 20 09/19/2014 1118   ALT 28 02/05/2017 0824   ALT 16 09/19/2014 1118   BILITOT 0.3 02/05/2017 0824   BILITOT 0.6 09/19/2014 1118      IMPRESSION: No FDG avid myelomatous lesions are evident.  Mildly hypermetabolic mediastinal and bilateral perihilar lymph nodes, likely reactive.   Electronically Signed   By: Julian Hy M.D.   On: 12/13/2016 11:41     RADIOGRAPHIC STUDIES: I have personally reviewed the radiological images as listed and agreed with the findings in the report. No results found.   ASSESSMENT & PLAN:  Multiple myeloma not having achieved remission [facility]) Multiple myeloma relapsed June  2018 M protein 1.6 g [slightly elevated/rising]; kappa [113] lambda light chain ratio= elevated; up to 8.7. Biochemical progression; currently with worsening anemia [stable 10]; july 10th PET no evidence of any uptake in the bones. Tolerating well; s/p dara cycle #2 day-8  # Proceed with cycle #2; day 15 daratumumab.  Labs acceptable. Anemia improving with hemoglobin 11.  #  Pre-medications/shingles prophylaxis steroids/Claritin/Singulair/tylenol/zantac.   # Right hip pain- ? Likely arthritic pain. Continue fentanyl patch 25 and also using oxycodone 5 mg as needed twice a day. New scripts given.   # confusion/delirium-question worsening as per family. on 0.5 mg respiridal qhs. We will try to space out the treatments if possible; while awaiting on myeloma panel.  # Poor appetite/new prescription for Remeron given 30 mg daily at  bedtime.   # 1 week/labs/infusion; 2 weeks/labs/infusion/MD.   No orders of the defined types were placed in this encounter.  All questions were answered. The patient knows to call the clinic with any problems, questions or concerns.      Cammie Sickle, MD 02/05/2017 5:50 PM

## 2017-02-06 LAB — KAPPA/LAMBDA LIGHT CHAINS
Kappa free light chain: 21.6 mg/L — ABNORMAL HIGH (ref 3.3–19.4)
Kappa, lambda light chain ratio: 3.86 — ABNORMAL HIGH (ref 0.26–1.65)
Lambda free light chains: 5.6 mg/L — ABNORMAL LOW (ref 5.7–26.3)

## 2017-02-06 LAB — MULTIPLE MYELOMA PANEL, SERUM
ALBUMIN SERPL ELPH-MCNC: 3 g/dL (ref 2.9–4.4)
Albumin/Glob SerPl: 1 (ref 0.7–1.7)
Alpha 1: 0.3 g/dL (ref 0.0–0.4)
Alpha2 Glob SerPl Elph-Mcnc: 0.7 g/dL (ref 0.4–1.0)
B-GLOBULIN SERPL ELPH-MCNC: 0.9 g/dL (ref 0.7–1.3)
GAMMA GLOB SERPL ELPH-MCNC: 1.1 g/dL (ref 0.4–1.8)
GLOBULIN, TOTAL: 3.1 g/dL (ref 2.2–3.9)
IgA: 13 mg/dL — ABNORMAL LOW (ref 64–422)
IgG (Immunoglobin G), Serum: 1133 mg/dL (ref 700–1600)
IgM (Immunoglobulin M), Srm: 24 mg/dL — ABNORMAL LOW (ref 26–217)
M PROTEIN SERPL ELPH-MCNC: 1 g/dL — AB
Total Protein ELP: 6.1 g/dL (ref 6.0–8.5)

## 2017-02-12 ENCOUNTER — Inpatient Hospital Stay: Payer: Medicare Other

## 2017-02-12 ENCOUNTER — Inpatient Hospital Stay: Payer: Medicare Other | Attending: Internal Medicine

## 2017-02-12 VITALS — BP 120/80 | HR 78 | Temp 97.0°F | Resp 20

## 2017-02-12 DIAGNOSIS — Z809 Family history of malignant neoplasm, unspecified: Secondary | ICD-10-CM | POA: Diagnosis not present

## 2017-02-12 DIAGNOSIS — Z79899 Other long term (current) drug therapy: Secondary | ICD-10-CM | POA: Diagnosis not present

## 2017-02-12 DIAGNOSIS — Z8739 Personal history of other diseases of the musculoskeletal system and connective tissue: Secondary | ICD-10-CM | POA: Diagnosis not present

## 2017-02-12 DIAGNOSIS — I2510 Atherosclerotic heart disease of native coronary artery without angina pectoris: Secondary | ICD-10-CM | POA: Diagnosis not present

## 2017-02-12 DIAGNOSIS — D649 Anemia, unspecified: Secondary | ICD-10-CM | POA: Insufficient documentation

## 2017-02-12 DIAGNOSIS — E785 Hyperlipidemia, unspecified: Secondary | ICD-10-CM | POA: Insufficient documentation

## 2017-02-12 DIAGNOSIS — R5383 Other fatigue: Secondary | ICD-10-CM | POA: Insufficient documentation

## 2017-02-12 DIAGNOSIS — Z5112 Encounter for antineoplastic immunotherapy: Secondary | ICD-10-CM | POA: Insufficient documentation

## 2017-02-12 DIAGNOSIS — C9002 Multiple myeloma in relapse: Secondary | ICD-10-CM | POA: Diagnosis not present

## 2017-02-12 DIAGNOSIS — Z923 Personal history of irradiation: Secondary | ICD-10-CM | POA: Insufficient documentation

## 2017-02-12 DIAGNOSIS — C9000 Multiple myeloma not having achieved remission: Secondary | ICD-10-CM

## 2017-02-12 DIAGNOSIS — M25551 Pain in right hip: Secondary | ICD-10-CM | POA: Diagnosis not present

## 2017-02-12 DIAGNOSIS — I10 Essential (primary) hypertension: Secondary | ICD-10-CM | POA: Insufficient documentation

## 2017-02-12 DIAGNOSIS — M1990 Unspecified osteoarthritis, unspecified site: Secondary | ICD-10-CM | POA: Insufficient documentation

## 2017-02-12 DIAGNOSIS — I4891 Unspecified atrial fibrillation: Secondary | ICD-10-CM | POA: Diagnosis not present

## 2017-02-12 DIAGNOSIS — F0390 Unspecified dementia without behavioral disturbance: Secondary | ICD-10-CM | POA: Insufficient documentation

## 2017-02-12 LAB — CBC WITH DIFFERENTIAL/PLATELET
Basophils Absolute: 0 10*3/uL (ref 0–0.1)
Basophils Relative: 1 %
Eosinophils Absolute: 0 10*3/uL (ref 0–0.7)
Eosinophils Relative: 0 %
HEMATOCRIT: 35.4 % (ref 35.0–47.0)
HEMOGLOBIN: 12.2 g/dL (ref 12.0–16.0)
LYMPHS ABS: 1.5 10*3/uL (ref 1.0–3.6)
Lymphocytes Relative: 31 %
MCH: 30.2 pg (ref 26.0–34.0)
MCHC: 34.4 g/dL (ref 32.0–36.0)
MCV: 87.8 fL (ref 80.0–100.0)
MONOS PCT: 11 %
Monocytes Absolute: 0.5 10*3/uL (ref 0.2–0.9)
NEUTROS PCT: 57 %
Neutro Abs: 2.7 10*3/uL (ref 1.4–6.5)
Platelets: 224 10*3/uL (ref 150–440)
RBC: 4.04 MIL/uL (ref 3.80–5.20)
RDW: 16.8 % — ABNORMAL HIGH (ref 11.5–14.5)
WBC: 4.7 10*3/uL (ref 3.6–11.0)

## 2017-02-12 LAB — BASIC METABOLIC PANEL
ANION GAP: 8 (ref 5–15)
BUN: 20 mg/dL (ref 6–20)
CHLORIDE: 101 mmol/L (ref 101–111)
CO2: 26 mmol/L (ref 22–32)
CREATININE: 0.8 mg/dL (ref 0.44–1.00)
Calcium: 8.9 mg/dL (ref 8.9–10.3)
GFR calc non Af Amer: >60 mL/min (ref >60)
Glucose, Bld: 123 mg/dL — ABNORMAL HIGH (ref 65–99)
Potassium: 4.4 mmol/L (ref 3.5–5.1)
Sodium: 135 mmol/L (ref 135–145)

## 2017-02-12 MED ORDER — PROCHLORPERAZINE MALEATE 10 MG PO TABS
10.0000 mg | ORAL_TABLET | Freq: Once | ORAL | Status: AC
  Administered 2017-02-12: 10 mg via ORAL
  Filled 2017-02-12: qty 1

## 2017-02-12 MED ORDER — ACETAMINOPHEN 325 MG PO TABS
650.0000 mg | ORAL_TABLET | Freq: Once | ORAL | Status: AC
  Administered 2017-02-12: 650 mg via ORAL
  Filled 2017-02-12: qty 2

## 2017-02-12 MED ORDER — SODIUM CHLORIDE 0.9 % IV SOLN
20.0000 mg | Freq: Once | INTRAVENOUS | Status: AC
  Administered 2017-02-12: 20 mg via INTRAVENOUS
  Filled 2017-02-12: qty 2

## 2017-02-12 MED ORDER — SODIUM CHLORIDE 0.9 % IV SOLN
Freq: Once | INTRAVENOUS | Status: AC
  Administered 2017-02-12: 10:00:00 via INTRAVENOUS
  Filled 2017-02-12: qty 1000

## 2017-02-12 MED ORDER — DIPHENHYDRAMINE HCL 25 MG PO CAPS
50.0000 mg | ORAL_CAPSULE | Freq: Once | ORAL | Status: AC
  Administered 2017-02-12: 50 mg via ORAL
  Filled 2017-02-12: qty 2

## 2017-02-12 MED ORDER — SODIUM CHLORIDE 0.9 % IV SOLN
800.0000 mg | Freq: Once | INTRAVENOUS | Status: AC
  Administered 2017-02-12: 800 mg via INTRAVENOUS
  Filled 2017-02-12: qty 40

## 2017-02-12 MED ORDER — HEPARIN SOD (PORK) LOCK FLUSH 100 UNIT/ML IV SOLN
500.0000 [IU] | Freq: Once | INTRAVENOUS | Status: AC | PRN
  Administered 2017-02-12: 500 [IU]
  Filled 2017-02-12 (×2): qty 5

## 2017-02-12 MED ORDER — SODIUM CHLORIDE 0.9% FLUSH
10.0000 mL | INTRAVENOUS | Status: DC | PRN
  Administered 2017-02-12: 10 mL
  Filled 2017-02-12: qty 10

## 2017-02-13 ENCOUNTER — Telehealth: Payer: Self-pay | Admitting: Pharmacist

## 2017-02-13 NOTE — Telephone Encounter (Signed)
Oral Chemotherapy Pharmacist Encounter  Received fax from [place] inquiring about status of Pomalyst. Informed Diplomat that the patient is no longer on Pomalyst.  Darl Pikes, PharmD, BCPS Hematology/Oncology Clinical Pharmacist [facility] [phone]  02/13/2017 4:12 PM

## 2017-02-19 ENCOUNTER — Inpatient Hospital Stay (HOSPITAL_BASED_OUTPATIENT_CLINIC_OR_DEPARTMENT_OTHER): Payer: Medicare Other | Admitting: Internal Medicine

## 2017-02-19 ENCOUNTER — Inpatient Hospital Stay: Payer: Medicare Other

## 2017-02-19 VITALS — BP 143/83 | HR 77 | Temp 97.5°F | Resp 18 | Ht 63.0 in | Wt 101.0 lb

## 2017-02-19 DIAGNOSIS — E860 Dehydration: Secondary | ICD-10-CM | POA: Insufficient documentation

## 2017-02-19 DIAGNOSIS — C9000 Multiple myeloma not having achieved remission: Secondary | ICD-10-CM

## 2017-02-19 DIAGNOSIS — E785 Hyperlipidemia, unspecified: Secondary | ICD-10-CM | POA: Diagnosis not present

## 2017-02-19 DIAGNOSIS — M25551 Pain in right hip: Secondary | ICD-10-CM

## 2017-02-19 DIAGNOSIS — Z79899 Other long term (current) drug therapy: Secondary | ICD-10-CM | POA: Diagnosis not present

## 2017-02-19 DIAGNOSIS — M1990 Unspecified osteoarthritis, unspecified site: Secondary | ICD-10-CM | POA: Diagnosis not present

## 2017-02-19 DIAGNOSIS — I2510 Atherosclerotic heart disease of native coronary artery without angina pectoris: Secondary | ICD-10-CM

## 2017-02-19 DIAGNOSIS — Z8739 Personal history of other diseases of the musculoskeletal system and connective tissue: Secondary | ICD-10-CM | POA: Diagnosis not present

## 2017-02-19 DIAGNOSIS — F0390 Unspecified dementia without behavioral disturbance: Secondary | ICD-10-CM | POA: Diagnosis not present

## 2017-02-19 DIAGNOSIS — Z923 Personal history of irradiation: Secondary | ICD-10-CM

## 2017-02-19 DIAGNOSIS — I4891 Unspecified atrial fibrillation: Secondary | ICD-10-CM | POA: Diagnosis not present

## 2017-02-19 DIAGNOSIS — C9002 Multiple myeloma in relapse: Secondary | ICD-10-CM

## 2017-02-19 DIAGNOSIS — Z809 Family history of malignant neoplasm, unspecified: Secondary | ICD-10-CM

## 2017-02-19 DIAGNOSIS — D649 Anemia, unspecified: Secondary | ICD-10-CM

## 2017-02-19 DIAGNOSIS — I10 Essential (primary) hypertension: Secondary | ICD-10-CM

## 2017-02-19 DIAGNOSIS — Z5112 Encounter for antineoplastic immunotherapy: Secondary | ICD-10-CM | POA: Diagnosis not present

## 2017-02-19 LAB — CBC WITH DIFFERENTIAL/PLATELET
Basophils Absolute: 0.1 10*3/uL (ref 0–0.1)
Basophils Relative: 1 %
Eosinophils Absolute: 0 10*3/uL (ref 0–0.7)
Eosinophils Relative: 0 %
HEMATOCRIT: 35 % (ref 35.0–47.0)
HEMOGLOBIN: 12.2 g/dL (ref 12.0–16.0)
LYMPHS ABS: 1.9 10*3/uL (ref 1.0–3.6)
Lymphocytes Relative: 30 %
MCH: 30.4 pg (ref 26.0–34.0)
MCHC: 34.8 g/dL (ref 32.0–36.0)
MCV: 87.4 fL (ref 80.0–100.0)
MONOS PCT: 9 %
Monocytes Absolute: 0.5 10*3/uL (ref 0.2–0.9)
NEUTROS ABS: 3.7 10*3/uL (ref 1.4–6.5)
NEUTROS PCT: 60 %
Platelets: 220 10*3/uL (ref 150–440)
RBC: 4 MIL/uL (ref 3.80–5.20)
RDW: 16.8 % — ABNORMAL HIGH (ref 11.5–14.5)
WBC: 6.2 10*3/uL (ref 3.6–11.0)

## 2017-02-19 LAB — BASIC METABOLIC PANEL
Anion gap: 8 (ref 5–15)
BUN: 24 mg/dL — AB (ref 6–20)
CHLORIDE: 101 mmol/L (ref 101–111)
CO2: 26 mmol/L (ref 22–32)
CREATININE: 0.8 mg/dL (ref 0.44–1.00)
Calcium: 9 mg/dL (ref 8.9–10.3)
GFR calc Af Amer: >60 mL/min (ref >60)
GFR calc non Af Amer: >60 mL/min (ref >60)
GLUCOSE: 126 mg/dL — AB (ref 65–99)
Potassium: 4.3 mmol/L (ref 3.5–5.1)
Sodium: 135 mmol/L (ref 135–145)

## 2017-02-19 MED ORDER — DIPHENHYDRAMINE HCL 25 MG PO CAPS
50.0000 mg | ORAL_CAPSULE | Freq: Once | ORAL | Status: AC
  Administered 2017-02-19: 50 mg via ORAL
  Filled 2017-02-19: qty 2

## 2017-02-19 MED ORDER — DARATUMUMAB CHEMO INJECTION 400 MG/20ML
800.0000 mg | Freq: Once | INTRAVENOUS | Status: AC
  Administered 2017-02-19: 800 mg via INTRAVENOUS
  Filled 2017-02-19: qty 40

## 2017-02-19 MED ORDER — SODIUM CHLORIDE 0.9 % IV SOLN
INTRAVENOUS | Status: DC
  Administered 2017-02-19: 12:00:00 via INTRAVENOUS
  Filled 2017-02-19 (×2): qty 1000

## 2017-02-19 MED ORDER — ACETAMINOPHEN 325 MG PO TABS
650.0000 mg | ORAL_TABLET | Freq: Once | ORAL | Status: AC
  Administered 2017-02-19: 650 mg via ORAL
  Filled 2017-02-19: qty 2

## 2017-02-19 MED ORDER — PROCHLORPERAZINE MALEATE 10 MG PO TABS
10.0000 mg | ORAL_TABLET | Freq: Once | ORAL | Status: AC
  Administered 2017-02-19: 10 mg via ORAL
  Filled 2017-02-19: qty 1

## 2017-02-19 MED ORDER — SODIUM CHLORIDE 0.9 % IV SOLN
20.0000 mg | Freq: Once | INTRAVENOUS | Status: AC
  Administered 2017-02-19: 20 mg via INTRAVENOUS
  Filled 2017-02-19: qty 2

## 2017-02-19 MED ORDER — HEPARIN SOD (PORK) LOCK FLUSH 100 UNIT/ML IV SOLN
500.0000 [IU] | Freq: Once | INTRAVENOUS | Status: AC | PRN
  Administered 2017-02-19: 500 [IU]

## 2017-02-19 MED ORDER — SODIUM CHLORIDE 0.9% FLUSH
10.0000 mL | Freq: Once | INTRAVENOUS | Status: AC
  Administered 2017-02-19: 10 mL via INTRAVENOUS
  Filled 2017-02-19: qty 10

## 2017-02-19 MED ORDER — SODIUM CHLORIDE 0.9 % IV SOLN: 16.0000 mg/kg | Freq: Once | INTRAVENOUS | Status: DC

## 2017-02-19 MED ORDER — HEPARIN SOD (PORK) LOCK FLUSH 100 UNIT/ML IV SOLN
500.0000 [IU] | Freq: Once | INTRAVENOUS | Status: AC
  Administered 2017-02-19: 500 [IU] via INTRAVENOUS
  Filled 2017-02-19: qty 5

## 2017-02-19 MED ORDER — SODIUM CHLORIDE 0.9 % IV SOLN
Freq: Once | INTRAVENOUS | Status: AC
  Administered 2017-02-19: 10:00:00 via INTRAVENOUS
  Filled 2017-02-19: qty 1000

## 2017-02-19 NOTE — Progress Notes (Signed)
[place] OFFICE PROGRESS NOTE  Patient Care Team: Derinda Late, MD as PCP - General (Family Medicine) Forest Gleason, MD (Oncology) Christene Lye, MD (General Surgery)  No matching staging information was found for the patient.   Oncology History   1. Stage III Multiple Myeloma a. 11/20/09: SPEP showed quantitative IgG 4567 mg/dL with M spike of 3.8 g/dL and IFE showing IgG monoclonal protein with light chain specificity.  [place] urine protein electrophoresis showed M spike 27% Bence Jones Protein.  a. 11/28/09: bone marrow biopsy: - PLASMA CELL DYSCRASIA CONSISTENT WITH MULTIPLE MYELOMA (55%PLASMA CELLS ON ASPIRATE, 75% BY CD138 IMMUNOHISTOCHEMISTRY,KAPPA RESTRICTED).TRILINEAGE HEMATOPOIESIS.46 XX,  Normal FISH panel. b. 11/29/09: bone survey: There are numerous, scattered 2 mm to 5 mm concentric  radiolucencies in the bony calvaria.   Two or three similar focal lucent areas in the right  femur.  There are observed changes consistent with disc disease at multiple levels of the cervical spine and multiple levels of the lumbar spine. c. 12/07/09: right humerus: Multiple tiny lucencies noted throughout the right humerus including humeral diaphysis. No fracture. d. 8/11- 4/12 Mephalan/prednisone/velcade X 6 cycles with decrease in Mspike to 1.0 gm/dl. e.osteonecrosis of jaw and diagnosed in December of 2013(Zometa was discontinued) 7.Patient was started on Velcade and Decadron   because of progressive multiple myeloma from January of 2013. 8. Started on Kyprolis 05/13 9.progressing disease on kyphorilis September of 2013 10 patient has been started on [name] daily for 21 days and one week off Decadron day1, 18 and 15   with 4 week cycle is finished palliative radiation therapy without much relief in pain(November, 2013) 11.Palliative radiation therapy to the right shoulder (December, 2014) 12 patient was on break from pomolidomide because of infection and it has  been presumed from December 05, 2014  # July 2017- skeletal survey- stable bone lesions.   # July 10th PET- No bone involvement  # July 16th 2018- [place]ara; stop Pom sec cytopenia.   ------------------------------------------------------  # History of osteonecrosis of the jaw- not on Zometa.     Multiple myeloma not having achieved remission ([place])     INTERVAL HISTORY: Patient is a fair historian at best.  [name] 83 y.o.  female pleasant patient With mild-moderate dementia and above history of Multiple myeloma currently daratumumab Status post cycle # 2  is here for follow-up.  As per the family patient has been fairly steady-  No significant improvement of her intermittent confusional episodes or any worsening.   She has been on fentanyl patch and oxycodone- may be once a day as needed. No nausea no vomiting. No skin rash. No diarrhea. No chest pain or shortness of breath or cough. Denies any tingling or numbness.   REVIEW OF SYSTEMS:  A complete 10 point review of system is done which is negative except mentioned above/history of present illness.   PAST MEDICAL HISTORY :  Past Medical History:  Diagnosis Date  . Anxiety   . Arthritis   . Atrial fibrillation ([place])   . Coronary arteriosclerosis   . History of blood transfusion 2014  . Hyperlipidemia   . Hypertension   . Multiple myeloma ([place])   . Multiple myeloma in relapse ([place]) 10/29/2014  . Osteonecrosis of jaw ([place])    left    PAST SURGICAL HISTORY :   Past Surgical History:  Procedure Laterality Date  . ABDOMINAL HYSTERECTOMY    . APPENDECTOMY    . PARTIAL COLECTOMY    .  TOTAL KNEE REVISION Left     FAMILY HISTORY :   Family History  Problem Relation Age of Onset  . Cancer Father   . Heart attack Mother     SOCIAL HISTORY:   Social History  Substance Use Topics  . Smoking status: Never Smoker  . Smokeless tobacco: Never Used  . Alcohol use 1.2 oz/week    2 Glasses of wine per week     ALLERGIES:  is allergic to biaxin [clarithromycin]; demerol [meperidine]; hydrocodone; sulfa antibiotics; tramadol; and ultracet [tramadol-acetaminophen].  MEDICATIONS:  Current Outpatient Prescriptions  Medication Sig Dispense Refill  . acetaminophen (TYLENOL 8 HOUR) 650 MG CR tablet Take 650 mg by mouth. 1 tablet twice a day x 4 days. Start taking 2 days prior to [facility] treatment, on the day of treatment and day after treatment.    Marland Kitchen acyclovir (ZOVIRAX) 400 MG tablet One pill a day [to prevent shingles] 30 tablet 3  . Biotin 1 MG CAPS Take 1 capsule by mouth daily.     . Calcium-Magnesium-Vitamin D (CALCIUM 500 PO) Take 1 tablet by mouth daily.     Marland Kitchen dexamethasone (DECADRON) 4 MG tablet Take 1 tablet (4 mg total) by mouth 2 (two) times daily. Start 2 days prior to infusion; Take it for 4 days. (Patient taking differently: Take 4 mg by mouth 2 (two) times daily. Start 1 days prior to infusion) 60 tablet 3  . Docusate Sodium (COLACE PO) Take 2 tablets by mouth 2 (two) times daily.    . fentaNYL (DURAGESIC - DOSED MCG/HR) 25 MCG/HR patch Place 1 patch (25 mcg total) onto the skin every 3 (three) days. 10 patch 0  . ferrous sulfate 325 (65 FE) MG EC tablet Take 325 mg by mouth daily.    Marland Kitchen lidocaine-prilocaine (EMLA) cream Apply 1 application topically as needed. Apply small amount to port site at least 1 hour prior to it being accessed, cover with plastic wrap 30 g 1  . loratadine (CLARITIN) 10 MG tablet Take 10 mg by mouth. 1 tablet a day x 4 days. Start taking 2 days prior to [facility] treatment, on the day of treatment and day after treatment.    . mirtazapine (REMERON) 30 MG tablet Take 1 tablet (30 mg total) by mouth at bedtime. 60 tablet 2  . montelukast (SINGULAIR) 10 MG tablet Take 1 tablet (10 mg total) by mouth at bedtime. Start 2 days prior to infusion. Take it for 4 days. 60 tablet 3  . Multiple Vitamins-Minerals (CENTRUM SILVER 50+WOMEN PO) Take 1 tablet by mouth daily.     Marland Kitchen  oxyCODONE (OXY IR/ROXICODONE) 5 MG immediate release tablet Take 0.5 tablets (2.5 mg total) by mouth every 8 (eight) hours as needed for severe pain. 30 tablet 0  . polyethylene glycol (MIRALAX / GLYCOLAX) packet Take 17 g by mouth daily as needed for mild constipation.    . ranitidine (ZANTAC) 150 MG capsule Take 150 mg by mouth 2 (two) times daily. 1 tablet twice a day x 4 days. Start taking 2 days prior to [facility] treatment, on the day of treatment and day after treatment.    . risperiDONE (RISPERDAL) 0.25 MG tablet Take 2 tablets (0.5 mg total) by mouth at bedtime. 60 tablet 3  . ondansetron (ZOFRAN) 4 MG tablet Take 1 tablet (4 mg total) by mouth every 8 (eight) hours as needed for nausea or vomiting. (Patient not taking: Reported on 02/19/2017) 20 tablet 0  . pomalidomide (POMALYST) 4 MG capsule  TAKE 1 CAPSULE BY MOUTH DAILY FOR 14 DAYS ON FOLLOWED BY 14 DAYS OFF. (Patient not taking: Reported on 02/05/2017) 14 capsule 0  . prochlorperazine (COMPAZINE) 10 MG tablet Take 1 tablet (10 mg total) by mouth every 8 (eight) hours as needed for nausea or vomiting. (Patient not taking: Reported on 02/19/2017) 30 tablet 1   No current facility-administered medications for this visit.    Facility-Administered Medications Ordered in Other Visits  Medication Dose Route Frequency Provider Last Rate Last Dose  . sodium chloride 0.9 % injection 10 mL  10 mL Intravenous PRN Forest Gleason, MD   10 mL at 03/01/15 1305    PHYSICAL EXAMINATION: ECOG PERFORMANCE STATUS: 0 - Asymptomatic  BP (!) 143/83   Pulse 77   Temp (!) 97.5 F (36.4 C) (Tympanic)   Resp 18   Ht _0  (1.6 m)   Wt 101 lb (45.8 kg)   BMI 17.89 kg/m   Filed Weights   02/19/17 0851  Weight: 101 lb (45.8 kg)    GENERAL: frail-appearing elderly female patiett Alert, no distress and comfortable. Accompanied by her care giver/ daughter-in-law. Patient is walking. EYES: no pallor or icterus OROPHARYNX: no thrush or ulceration; good  dentition  NECK: supple, no masses felt LYMPH:  no palpable lymphadenopathy in the cervical, axillary or inguinal regions LUNGS: clear to auscultation and  No wheeze or crackles HEART/CVS: regular rate & rhythm and no murmurs; no swelling of the legs. No tenderness.  ABDOMEN:abdomen soft, non-tender and normal bowel sounds Musculoskeletal:no cyanosis of digits and no clubbing  PSYCH: alert & oriented x 3 with fluent speech NEURO: no focal motor/sensory deficits SKIN:  no rashes or significant lesions  LABORATORY DATA:  I have reviewed the data as listed    Component Value Date/Time   NA 135 02/19/2017 0840   NA 138 09/19/2014 1118   K 4.3 02/19/2017 0840   K 4.0 09/19/2014 1118   CL 101 02/19/2017 0840   CL 103 09/19/2014 1118   CO2 26 02/19/2017 0840   CO2 29 09/19/2014 1118   GLUCOSE 126 (H) 02/19/2017 0840   GLUCOSE 91 09/19/2014 1118   BUN 24 (H) 02/19/2017 0840   BUN 15 09/19/2014 1118   CREATININE 0.80 02/19/2017 0840   CREATININE 0.69 09/19/2014 1118   CALCIUM 9.0 02/19/2017 0840   CALCIUM 9.0 09/19/2014 1118   PROT 6.7 02/05/2017 0824   PROT 6.8 09/19/2014 1118   ALBUMIN 3.2 (L) 02/05/2017 0824   ALBUMIN 3.9 09/19/2014 1118   AST 29 02/05/2017 0824   AST 20 09/19/2014 1118   ALT 28 02/05/2017 0824   ALT 16 09/19/2014 1118   ALKPHOS 113 02/05/2017 0824   ALKPHOS 85 09/19/2014 1118   BILITOT 0.3 02/05/2017 0824   BILITOT 0.6 09/19/2014 1118   GFRNONAA >60 02/19/2017 0840   GFRNONAA >60 09/19/2014 1118   GFRAA >60 02/19/2017 0840   GFRAA >60 09/19/2014 1118    No results found for: SPEP, UPEP  Lab Results  Component Value Date   WBC 6.2 02/19/2017   NEUTROABS 3.7 02/19/2017   HGB 12.2 02/19/2017   HCT 35.0 02/19/2017   MCV 87.4 02/19/2017   PLT 220 02/19/2017      Chemistry      Component Value Date/Time   NA 135 02/19/2017 0840   NA 138 09/19/2014 1118   K 4.3 02/19/2017 0840   K 4.0 09/19/2014 1118   CL 101 02/19/2017 0840   CL 103  09/19/2014 1118  CO2 26 02/19/2017 0840   CO2 29 09/19/2014 1118   BUN 24 (H) 02/19/2017 0840   BUN 15 09/19/2014 1118   CREATININE 0.80 02/19/2017 0840   CREATININE 0.69 09/19/2014 1118      Component Value Date/Time   CALCIUM 9.0 02/19/2017 0840   CALCIUM 9.0 09/19/2014 1118   ALKPHOS 113 02/05/2017 0824   ALKPHOS 85 09/19/2014 1118   AST 29 02/05/2017 0824   AST 20 09/19/2014 1118   ALT 28 02/05/2017 0824   ALT 16 09/19/2014 1118   BILITOT 0.3 02/05/2017 0824   BILITOT 0.6 09/19/2014 1118      IMPRESSION: No FDG avid myelomatous lesions are evident.  Mildly hypermetabolic mediastinal and bilateral perihilar lymph nodes, likely reactive.   Electronically Signed   By: Julian Hy M.D.   On: 12/13/2016 11:41  Results for CHERY, GIUSTO (MRN [id number]) as of 02/19/2017 09:10  Ref. Range 10/29/2016 10:53 10/29/2016 10:58 12/03/2016 10:54 02/05/2017 08:09 02/05/2017 08:24  Kappa free light chain Latest Ref Range: 3.3 - 19.4 mg/L 113.3 (H)  110.6 (H)  21.6 (H)  Lamda free light chains Latest Ref Range: 5.7 - 26.3 mg/L 12.9  11.6  5.6 (L)  Kappa, lamda light chain ratio Latest Ref Range: 0.26 - 1.65  8.78 (H)  9.53 (H)  3.86 (H)  M Protein SerPl Elph-Mcnc Latest Ref Range: Not Observed g/dL  1.4 (H) 1.6 (H) 1.0 (H)      RADIOGRAPHIC STUDIES: I have personally reviewed the radiological images as listed and agreed with the findings in the report. No results found.   ASSESSMENT & PLAN:  Multiple myeloma not having achieved remission [facility]) Multiple myeloma relapsed June  2018 M protein 1.6 g [slightly elevated/rising]; kappa [113] lambda light chain ratio= elevated; up to 8.7.    # Patient currently on [place] well; improvement of the M protein noted/kappa lambda light chain noted  # Proceed with cycle #3; day 1 daratumumab.  Labs acceptable. Anemia improving with hemoglobin 11.  #  Pre-medications/shingles prophylaxis  steroids/Claritin/Singulair/tylenol/zantac. Given the improved tolerance; would recommend rapid infusion protocol.  # Right hip pain-Likely arthritic pain. Continue fentanyl patch 25 and also using oxycodone 5 mg as needed twice a day. STABLE.   # confusion/delirium-question worsening as per family. on 0.5 mg respiridal qhs.   #  2 weeks/labs/infusion/MD.   No orders of the defined types were placed in this encounter.  All questions were answered. The patient knows to call the clinic with any problems, questions or concerns.      Cammie Sickle, MD 02/22/2017 1:25 AM

## 2017-02-19 NOTE — Progress Notes (Signed)
Patient here today for Day 1 cycle 3 of darzalex infusion. Spoke with MD about changing to rapid infusion as patient has tolerated the first two infusions with no reaction. MD stated ok to proceed with rapid infusion. Orders changed for future doses as well.

## 2017-02-19 NOTE — Assessment & Plan Note (Addendum)
Multiple myeloma relapsed June  2018 M protein 1.6 g [slightly elevated/rising]; kappa [113] lambda light chain ratio= elevated; up to 8.7.    # Patient currently on [place] well; improvement of the M protein noted/kappa lambda light chain noted  # Proceed with cycle #3; day 1 daratumumab.  Labs acceptable. Anemia improving with hemoglobin 11.  #  Pre-medications/shingles prophylaxis steroids/Claritin/Singulair/tylenol/zantac. Given the improved tolerance; would recommend rapid infusion protocol.  # Right hip pain-Likely arthritic pain. Continue fentanyl patch 25 and also using oxycodone 5 mg as needed twice a day. STABLE.   # confusion/delirium-question worsening as per family. on 0.5 mg respiridal qhs.   #  2 weeks/labs/infusion/MD.

## 2017-03-05 ENCOUNTER — Inpatient Hospital Stay: Payer: Medicare Other

## 2017-03-05 ENCOUNTER — Inpatient Hospital Stay (HOSPITAL_BASED_OUTPATIENT_CLINIC_OR_DEPARTMENT_OTHER): Payer: Medicare Other | Admitting: Internal Medicine

## 2017-03-05 VITALS — BP 114/77 | HR 84 | Temp 97.6°F | Resp 18 | Ht 63.0 in | Wt 100.0 lb

## 2017-03-05 DIAGNOSIS — I4891 Unspecified atrial fibrillation: Secondary | ICD-10-CM

## 2017-03-05 DIAGNOSIS — I10 Essential (primary) hypertension: Secondary | ICD-10-CM

## 2017-03-05 DIAGNOSIS — M25551 Pain in right hip: Secondary | ICD-10-CM

## 2017-03-05 DIAGNOSIS — M1990 Unspecified osteoarthritis, unspecified site: Secondary | ICD-10-CM | POA: Diagnosis not present

## 2017-03-05 DIAGNOSIS — C9002 Multiple myeloma in relapse: Secondary | ICD-10-CM | POA: Diagnosis not present

## 2017-03-05 DIAGNOSIS — C9000 Multiple myeloma not having achieved remission: Secondary | ICD-10-CM

## 2017-03-05 DIAGNOSIS — Z809 Family history of malignant neoplasm, unspecified: Secondary | ICD-10-CM

## 2017-03-05 DIAGNOSIS — I2510 Atherosclerotic heart disease of native coronary artery without angina pectoris: Secondary | ICD-10-CM | POA: Diagnosis not present

## 2017-03-05 DIAGNOSIS — Z5112 Encounter for antineoplastic immunotherapy: Secondary | ICD-10-CM | POA: Diagnosis not present

## 2017-03-05 DIAGNOSIS — R5383 Other fatigue: Secondary | ICD-10-CM

## 2017-03-05 DIAGNOSIS — F0390 Unspecified dementia without behavioral disturbance: Secondary | ICD-10-CM | POA: Diagnosis not present

## 2017-03-05 DIAGNOSIS — G893 Neoplasm related pain (acute) (chronic): Secondary | ICD-10-CM

## 2017-03-05 DIAGNOSIS — Z923 Personal history of irradiation: Secondary | ICD-10-CM | POA: Diagnosis not present

## 2017-03-05 DIAGNOSIS — Z8739 Personal history of other diseases of the musculoskeletal system and connective tissue: Secondary | ICD-10-CM | POA: Diagnosis not present

## 2017-03-05 DIAGNOSIS — Z79899 Other long term (current) drug therapy: Secondary | ICD-10-CM

## 2017-03-05 DIAGNOSIS — D649 Anemia, unspecified: Secondary | ICD-10-CM | POA: Diagnosis not present

## 2017-03-05 DIAGNOSIS — E785 Hyperlipidemia, unspecified: Secondary | ICD-10-CM | POA: Diagnosis not present

## 2017-03-05 LAB — BASIC METABOLIC PANEL
ANION GAP: 9 (ref 5–15)
BUN: 20 mg/dL (ref 6–20)
CHLORIDE: 102 mmol/L (ref 101–111)
CO2: 25 mmol/L (ref 22–32)
Calcium: 8.8 mg/dL — ABNORMAL LOW (ref 8.9–10.3)
Creatinine, Ser: 0.83 mg/dL (ref 0.44–1.00)
GFR calc Af Amer: >60 mL/min (ref >60)
GLUCOSE: 149 mg/dL — AB (ref 65–99)
POTASSIUM: 3.8 mmol/L (ref 3.5–5.1)
Sodium: 136 mmol/L (ref 135–145)

## 2017-03-05 LAB — CBC WITH DIFFERENTIAL/PLATELET
BASOS ABS: 0 10*3/uL (ref 0–0.1)
Basophils Relative: 0 %
EOS PCT: 0 %
Eosinophils Absolute: 0 10*3/uL (ref 0–0.7)
HEMATOCRIT: 35.4 % (ref 35.0–47.0)
HEMOGLOBIN: 12.1 g/dL (ref 12.0–16.0)
LYMPHS ABS: 1.4 10*3/uL (ref 1.0–3.6)
LYMPHS PCT: 25 %
MCH: 29.9 pg (ref 26.0–34.0)
MCHC: 34 g/dL (ref 32.0–36.0)
MCV: 87.7 fL (ref 80.0–100.0)
Monocytes Absolute: 0.5 10*3/uL (ref 0.2–0.9)
Monocytes Relative: 9 %
NEUTROS ABS: 3.8 10*3/uL (ref 1.4–6.5)
Neutrophils Relative %: 66 %
Platelets: 289 10*3/uL (ref 150–440)
RBC: 4.04 MIL/uL (ref 3.80–5.20)
RDW: 15.9 % — ABNORMAL HIGH (ref 11.5–14.5)
WBC: 5.8 10*3/uL (ref 3.6–11.0)

## 2017-03-05 MED ORDER — SODIUM CHLORIDE 0.9 % IV SOLN
Freq: Once | INTRAVENOUS | Status: AC
  Administered 2017-03-05: 10:00:00 via INTRAVENOUS
  Filled 2017-03-05: qty 1000

## 2017-03-05 MED ORDER — SODIUM CHLORIDE 0.9 % IV SOLN
800.0000 mg | Freq: Once | INTRAVENOUS | Status: AC
  Administered 2017-03-05: 800 mg via INTRAVENOUS
  Filled 2017-03-05: qty 40

## 2017-03-05 MED ORDER — OXYCODONE HCL 5 MG PO TABS: 2.5000 mg | ORAL_TABLET | Freq: Three times a day (TID) | ORAL | 0 refills | Status: DC | PRN

## 2017-03-05 MED ORDER — FENTANYL 25 MCG/HR TD PT72: 25.0000 ug | MEDICATED_PATCH | TRANSDERMAL | 0 refills | Status: DC

## 2017-03-05 MED ORDER — ACETAMINOPHEN 325 MG PO TABS
650.0000 mg | ORAL_TABLET | Freq: Once | ORAL | Status: AC
Start: 2017-03-05 — End: 2017-03-05
  Administered 2017-03-05: 650 mg via ORAL
  Filled 2017-03-05: qty 2

## 2017-03-05 MED ORDER — PROCHLORPERAZINE MALEATE 10 MG PO TABS
10.0000 mg | ORAL_TABLET | Freq: Once | ORAL | Status: AC
Start: 2017-03-05 — End: 2017-03-05
  Administered 2017-03-05: 10 mg via ORAL
  Filled 2017-03-05: qty 1

## 2017-03-05 MED ORDER — SODIUM CHLORIDE 0.9 % IV SOLN
20.0000 mg | Freq: Once | INTRAVENOUS | Status: AC
  Administered 2017-03-05: 20 mg via INTRAVENOUS
  Filled 2017-03-05: qty 2

## 2017-03-05 MED ORDER — HEPARIN SOD (PORK) LOCK FLUSH 100 UNIT/ML IV SOLN
500.0000 [IU] | Freq: Once | INTRAVENOUS | Status: AC
  Administered 2017-03-05: 500 [IU] via INTRAVENOUS

## 2017-03-05 MED ORDER — DIPHENHYDRAMINE HCL 25 MG PO CAPS
50.0000 mg | ORAL_CAPSULE | Freq: Once | ORAL | Status: AC
  Administered 2017-03-05: 50 mg via ORAL
  Filled 2017-03-05: qty 2

## 2017-03-05 NOTE — Progress Notes (Signed)
[facility] OFFICE PROGRESS NOTE  Patient Care Team: Derinda Late, MD as PCP - General (Family Medicine) Forest Gleason, MD (Oncology) Christene Lye, MD (General Surgery)  No matching staging information was found for the patient.   Oncology History   1. Stage III Multiple Myeloma a. 11/20/09: SPEP showed quantitative IgG 4567 mg/dL with M spike of 3.8 g/dL and IFE showing IgG monoclonal protein with light chain specificity.  [place] urine protein electrophoresis showed M spike 27% Bence Jones Protein.  a. 11/28/09: bone marrow biopsy: - PLASMA CELL DYSCRASIA CONSISTENT WITH MULTIPLE MYELOMA (55%PLASMA CELLS ON ASPIRATE, 75% BY CD138 IMMUNOHISTOCHEMISTRY,KAPPA RESTRICTED).TRILINEAGE HEMATOPOIESIS.46 XX,  Normal FISH panel. b. 11/29/09: bone survey: There are numerous, scattered 2 mm to 5 mm concentric  radiolucencies in the bony calvaria.   Two or three similar focal lucent areas in the right  femur.  There are observed changes consistent with disc disease at multiple levels of the cervical spine and multiple levels of the lumbar spine. c. 12/07/09: right humerus: Multiple tiny lucencies noted throughout the right humerus including humeral diaphysis. No fracture. d. 8/11- 4/12 Mephalan/prednisone/velcade X 6 cycles with decrease in Mspike to 1.0 gm/dl. e.osteonecrosis of jaw and diagnosed in December of 2013(Zometa was discontinued) 7.Patient was started on Velcade and Decadron   because of progressive multiple myeloma from January of 2013. 8. Started on Kyprolis 05/13 9.progressing disease on kyphorilis September of 2013 10 patient has been started on [name] daily for 21 days and one week off Decadron day1, 18 and 15   with 4 week cycle is finished palliative radiation therapy without much relief in pain(November, 2013) 11.Palliative radiation therapy to the right shoulder (December, 2014) 12 patient was on break from pomolidomide because of infection and it has  been presumed from December 05, 2014  # July 2017- skeletal survey- stable bone lesions.   # July 10th PET- No bone involvement  # July 16th 2018- Dara; stop Pom sec cytopenia.   ------------------------------------------------------  # History of osteonecrosis of the jaw- not on Zometa.     Multiple myeloma not having achieved remission ([place])     INTERVAL HISTORY: Patient is a fair historian at best.  [name] 83 y.o.  female pleasant patient With mild-moderate dementia and above history of Multiple myeloma currently daratumumab Status post cycle # 3; day- 1  is here for follow-up.  As per the family patient noted to have worsening episodes of fatigue; sleeping a lot. As per the family patient had been complaining of worsening pain in the right hip.   She has been on fentanyl patch and oxycodone- may be once a day as needed. No nausea no vomiting. No skin rash. No diarrhea. No chest pain or shortness of breath or cough. Denies any tingling or numbness. She is having increasing episodes of  confusion.  REVIEW OF SYSTEMS:  A complete 10 point review of system is done which is negative except mentioned above/history of present illness.   PAST MEDICAL HISTORY :  Past Medical History:  Diagnosis Date  . Anxiety   . Arthritis   . Atrial fibrillation ([place])   . Coronary arteriosclerosis   . History of blood transfusion 2014  . Hyperlipidemia   . Hypertension   . Multiple myeloma ([place])   . Multiple myeloma in relapse ([place]) 10/29/2014  . Osteonecrosis of jaw ([place])    left    PAST SURGICAL HISTORY :   Past Surgical History:  Procedure Laterality Date  .  ABDOMINAL HYSTERECTOMY    . APPENDECTOMY    . PARTIAL COLECTOMY    . TOTAL KNEE REVISION Left     FAMILY HISTORY :   Family History  Problem Relation Age of Onset  . Cancer Father   . Heart attack Mother     SOCIAL HISTORY:   Social History  Substance Use Topics  . Smoking status: Never Smoker  . Smokeless  tobacco: Never Used  . Alcohol use 1.2 oz/week    2 Glasses of wine per week    ALLERGIES:  is allergic to biaxin [clarithromycin]; demerol [meperidine]; hydrocodone; sulfa antibiotics; tramadol; and ultracet [tramadol-acetaminophen].  MEDICATIONS:  Current Outpatient Prescriptions  Medication Sig Dispense Refill  . acetaminophen (TYLENOL 8 HOUR) 650 MG CR tablet Take 650 mg by mouth. 1 tablet twice a day x 4 days. Start taking 2 days prior to [facility] treatment, on the day of treatment and day after treatment.    Marland Kitchen acyclovir (ZOVIRAX) 400 MG tablet One pill a day [to prevent shingles] 30 tablet 3  . Biotin 1 MG CAPS Take 1 capsule by mouth daily.     . Calcium-Magnesium-Vitamin D (CALCIUM 500 PO) Take 1 tablet by mouth daily.     Marland Kitchen dexamethasone (DECADRON) 4 MG tablet Take 1 tablet (4 mg total) by mouth 2 (two) times daily. Start 2 days prior to infusion; Take it for 4 days. (Patient taking differently: Take 4 mg by mouth 2 (two) times daily. Start 1 days prior to infusion) 60 tablet 3  . Docusate Sodium (COLACE PO) Take 2 tablets by mouth 2 (two) times daily.    . fentaNYL (DURAGESIC - DOSED MCG/HR) 25 MCG/HR patch Place 1 patch (25 mcg total) onto the skin every 3 (three) days. 10 patch 0  . ferrous sulfate 325 (65 FE) MG EC tablet Take 325 mg by mouth daily.    Marland Kitchen loratadine (CLARITIN) 10 MG tablet Take 10 mg by mouth. 1 tablet a day x 4 days. Start taking 2 days prior to [facility] treatment, on the day of treatment and day after treatment.    . mirtazapine (REMERON) 30 MG tablet Take 1 tablet (30 mg total) by mouth at bedtime. 60 tablet 2  . montelukast (SINGULAIR) 10 MG tablet Take 1 tablet (10 mg total) by mouth at bedtime. Start 2 days prior to infusion. Take it for 4 days. 60 tablet 3  . Multiple Vitamins-Minerals (CENTRUM SILVER 50+WOMEN PO) Take 1 tablet by mouth daily.     Marland Kitchen oxyCODONE (OXY IR/ROXICODONE) 5 MG immediate release tablet Take 0.5 tablets (2.5 mg total) by mouth every 8  (eight) hours as needed for severe pain. 30 tablet 0  . polyethylene glycol (MIRALAX / GLYCOLAX) packet Take 17 g by mouth daily as needed for mild constipation.    . ranitidine (ZANTAC) 150 MG capsule Take 150 mg by mouth 2 (two) times daily. 1 tablet twice a day x 4 days. Start taking 2 days prior to [facility] treatment, on the day of treatment and day after treatment.    . risperiDONE (RISPERDAL) 0.25 MG tablet Take 2 tablets (0.5 mg total) by mouth at bedtime. 60 tablet 3  . lidocaine-prilocaine (EMLA) cream Apply 1 application topically as needed. Apply small amount to port site at least 1 hour prior to it being accessed, cover with plastic wrap (Patient not taking: Reported on 03/05/2017) 30 g 1  . ondansetron (ZOFRAN) 4 MG tablet Take 1 tablet (4 mg total) by mouth every 8 (eight)  hours as needed for nausea or vomiting. (Patient not taking: Reported on 02/19/2017) 20 tablet 0  . pomalidomide (POMALYST) 4 MG capsule TAKE 1 CAPSULE BY MOUTH DAILY FOR 14 DAYS ON FOLLOWED BY 14 DAYS OFF. (Patient not taking: Reported on 02/05/2017) 14 capsule 0  . prochlorperazine (COMPAZINE) 10 MG tablet Take 1 tablet (10 mg total) by mouth every 8 (eight) hours as needed for nausea or vomiting. (Patient not taking: Reported on 02/19/2017) 30 tablet 1   No current facility-administered medications for this visit.    Facility-Administered Medications Ordered in Other Visits  Medication Dose Route Frequency Provider Last Rate Last Dose  . sodium chloride 0.9 % injection 10 mL  10 mL Intravenous PRN Forest Gleason, MD   10 mL at 03/01/15 1305    PHYSICAL EXAMINATION: ECOG PERFORMANCE STATUS: 0 - Asymptomatic  BP 114/77 (BP Location: Left Arm, Patient Position: Sitting)   Pulse 84   Temp 97.6 F (36.4 C) (Tympanic)   Resp 18   Ht _0  (1.6 m)   Wt 100 lb (45.4 kg)   BMI 17.71 kg/m   Filed Weights   03/05/17 0947  Weight: 100 lb (45.4 kg)    GENERAL: frail-appearing elderly female patiett Alert, no  distress and comfortable. Accompanied by her care giver/ daughter-in-law. Patient is walking. EYES: no pallor or icterus OROPHARYNX: no thrush or ulceration; good dentition  NECK: supple, no masses felt LYMPH:  no palpable lymphadenopathy in the cervical, axillary or inguinal regions LUNGS: clear to auscultation and  No wheeze or crackles HEART/CVS: regular rate & rhythm and no murmurs; no swelling of the legs. No tenderness.  ABDOMEN:abdomen soft, non-tender and normal bowel sounds Musculoskeletal:no cyanosis of digits and no clubbing  PSYCH: alert & oriented x 3 with fluent speech NEURO: no focal motor/sensory deficits SKIN:  no rashes or significant lesions  LABORATORY DATA:  I have reviewed the data as listed    Component Value Date/Time   NA 136 03/05/2017 0916   NA 138 09/19/2014 1118   K 3.8 03/05/2017 0916   K 4.0 09/19/2014 1118   CL 102 03/05/2017 0916   CL 103 09/19/2014 1118   CO2 25 03/05/2017 0916   CO2 29 09/19/2014 1118   GLUCOSE 149 (H) 03/05/2017 0916   GLUCOSE 91 09/19/2014 1118   BUN 20 03/05/2017 0916   BUN 15 09/19/2014 1118   CREATININE 0.83 03/05/2017 0916   CREATININE 0.69 09/19/2014 1118   CALCIUM 8.8 (L) 03/05/2017 0916   CALCIUM 9.0 09/19/2014 1118   PROT 6.7 02/05/2017 0824   PROT 6.8 09/19/2014 1118   ALBUMIN 3.2 (L) 02/05/2017 0824   ALBUMIN 3.9 09/19/2014 1118   AST 29 02/05/2017 0824   AST 20 09/19/2014 1118   ALT 28 02/05/2017 0824   ALT 16 09/19/2014 1118   ALKPHOS 113 02/05/2017 0824   ALKPHOS 85 09/19/2014 1118   BILITOT 0.3 02/05/2017 0824   BILITOT 0.6 09/19/2014 1118   GFRNONAA >60 03/05/2017 0916   GFRNONAA >60 09/19/2014 1118   GFRAA >60 03/05/2017 0916   GFRAA >60 09/19/2014 1118    No results found for: SPEP, UPEP  Lab Results  Component Value Date   WBC 5.8 03/05/2017   NEUTROABS 3.8 03/05/2017   HGB 12.1 03/05/2017   HCT 35.4 03/05/2017   MCV 87.7 03/05/2017   PLT 289 03/05/2017      Chemistry       Component Value Date/Time   NA 136 03/05/2017 0916   NA  138 09/19/2014 1118   K 3.8 03/05/2017 0916   K 4.0 09/19/2014 1118   CL 102 03/05/2017 0916   CL 103 09/19/2014 1118   CO2 25 03/05/2017 0916   CO2 29 09/19/2014 1118   BUN 20 03/05/2017 0916   BUN 15 09/19/2014 1118   CREATININE 0.83 03/05/2017 0916   CREATININE 0.69 09/19/2014 1118      Component Value Date/Time   CALCIUM 8.8 (L) 03/05/2017 0916   CALCIUM 9.0 09/19/2014 1118   ALKPHOS 113 02/05/2017 0824   ALKPHOS 85 09/19/2014 1118   AST 29 02/05/2017 0824   AST 20 09/19/2014 1118   ALT 28 02/05/2017 0824   ALT 16 09/19/2014 1118   BILITOT 0.3 02/05/2017 0824   BILITOT 0.6 09/19/2014 1118      IMPRESSION: No FDG avid myelomatous lesions are evident.  Mildly hypermetabolic mediastinal and bilateral perihilar lymph nodes, likely reactive.   Electronically Signed   By: Julian Hy M.D.   On: 12/13/2016 11:41  Results for JALYAH, WEINHEIMER (MRN [id number]) as of 02/19/2017 09:10  Ref. Range 10/29/2016 10:53 10/29/2016 10:58 12/03/2016 10:54 02/05/2017 08:09 02/05/2017 08:24  Kappa free light chain Latest Ref Range: 3.3 - 19.4 mg/L 113.3 (H)  110.6 (H)  21.6 (H)  Lamda free light chains Latest Ref Range: 5.7 - 26.3 mg/L 12.9  11.6  5.6 (L)  Kappa, lamda light chain ratio Latest Ref Range: 0.26 - 1.65  8.78 (H)  9.53 (H)  3.86 (H)  M Protein SerPl Elph-Mcnc Latest Ref Range: Not Observed g/dL  1.4 (H) 1.6 (H) 1.0 (H)      RADIOGRAPHIC STUDIES: I have personally reviewed the radiological images as listed and agreed with the findings in the report. No results found.   ASSESSMENT & PLAN:  Multiple myeloma not having achieved remission [facility]) Multiple myeloma relapsed June  2018 M protein 1.6 g [slightly elevated/rising]; kappa [113] lambda light chain ratio= elevated; up to 8.7.    # Patient currently on [place] well- except for worsening fatigue [C discussion below]; improvement of the M protein  noted/kappa lambda light chain noted  # Proceed with cycle #3; day 15 daratumumab.  Labs acceptable. Anemia improving with hemoglobin 12.  # Fatigue- sec to dara; will switch treatments to every 4 weeks; starting next cycle.   #  Pre-medications/shingles prophylaxis steroids/Claritin/Singulair/tylenol/zantac. Given the improved tolerance; would recommend rapid infusion protocol.  # Right hip pain-Likely arthritic pain. Continue fentanyl patch 25 and also using oxycodone 5 mg as needed twice a day. STABLE.   # confusion/delirium-question worsening as per family. on 0.5 mg respiridal qhs.   #  4 weeks/labs/infusion/MD/ Myeloma labs [10/24].   Orders Placed This Encounter  Procedures  . CBC with Differential    Standing Status:   Future    Standing Expiration Date:   03/05/2018  . Comprehensive metabolic panel    Standing Status:   Future    Standing Expiration Date:   03/05/2018  . Multiple Myeloma Panel (SPEP&IFE w/QIG)    Standing Status:   Future    Standing Expiration Date:   03/05/2018  . Kappa/lambda light chains    Standing Status:   Future    Standing Expiration Date:   03/05/2018   All questions were answered. The patient knows to call the clinic with any problems, questions or concerns.      Cammie Sickle, MD 03/05/2017 6:59 PM

## 2017-03-05 NOTE — Assessment & Plan Note (Addendum)
Multiple myeloma relapsed June  2018 M protein 1.6 g [slightly elevated/rising]; kappa [113] lambda light chain ratio= elevated; up to 8.7.    # Patient currently on [place] well- except for worsening fatigue [C discussion below]; improvement of the M protein noted/kappa lambda light chain noted  # Proceed with cycle #3; day 15 daratumumab.  Labs acceptable. Anemia improving with hemoglobin 12.  # Fatigue- sec to dara; will switch treatments to every 4 weeks; starting next cycle.   #  Pre-medications/shingles prophylaxis steroids/Claritin/Singulair/tylenol/zantac. Given the improved tolerance; would recommend rapid infusion protocol.  # Right hip pain-Likely arthritic pain. Continue fentanyl patch 25 and also using oxycodone 5 mg as needed twice a day. STABLE.   # confusion/delirium-question worsening as per family. on 0.5 mg respiridal qhs.   #  4 weeks/labs/infusion/MD/ Myeloma labs [10/24].

## 2017-03-05 NOTE — Progress Notes (Signed)
Patient's caregiver states "this has been a bad week for [name]. He has had many days of confusion. She is sleeping 2-4 hours deep sleep during the day. Her hip pain has increased last week. I gave her a pain tablet at 8 am today."  Patient denies any pain today. Patient's caregiver reports that [name] will be out of town on 10/6-10/12. She would like to know how to coordinate her next chemotherapy treatments around these vacation dates.

## 2017-03-26 ENCOUNTER — Ambulatory Visit: Payer: Medicare Other

## 2017-03-26 ENCOUNTER — Other Ambulatory Visit: Payer: Medicare Other

## 2017-03-26 ENCOUNTER — Ambulatory Visit: Payer: Medicare Other | Admitting: Internal Medicine

## 2017-04-01 DIAGNOSIS — F5104 Psychophysiologic insomnia: Secondary | ICD-10-CM | POA: Diagnosis not present

## 2017-04-01 DIAGNOSIS — K219 Gastro-esophageal reflux disease without esophagitis: Secondary | ICD-10-CM | POA: Diagnosis not present

## 2017-04-01 DIAGNOSIS — Z23 Encounter for immunization: Secondary | ICD-10-CM | POA: Diagnosis not present

## 2017-04-02 ENCOUNTER — Inpatient Hospital Stay: Payer: Medicare Other

## 2017-04-02 ENCOUNTER — Inpatient Hospital Stay (HOSPITAL_BASED_OUTPATIENT_CLINIC_OR_DEPARTMENT_OTHER): Payer: Medicare Other | Admitting: Internal Medicine

## 2017-04-02 ENCOUNTER — Inpatient Hospital Stay: Payer: Medicare Other | Attending: Internal Medicine

## 2017-04-02 ENCOUNTER — Encounter: Payer: Self-pay | Admitting: Internal Medicine

## 2017-04-02 VITALS — BP 131/75 | HR 89 | Temp 97.3°F | Resp 14 | Wt 101.0 lb

## 2017-04-02 DIAGNOSIS — I4891 Unspecified atrial fibrillation: Secondary | ICD-10-CM | POA: Insufficient documentation

## 2017-04-02 DIAGNOSIS — I10 Essential (primary) hypertension: Secondary | ICD-10-CM | POA: Diagnosis not present

## 2017-04-02 DIAGNOSIS — F419 Anxiety disorder, unspecified: Secondary | ICD-10-CM

## 2017-04-02 DIAGNOSIS — Z79899 Other long term (current) drug therapy: Secondary | ICD-10-CM

## 2017-04-02 DIAGNOSIS — G893 Neoplasm related pain (acute) (chronic): Secondary | ICD-10-CM

## 2017-04-02 DIAGNOSIS — Z5112 Encounter for antineoplastic immunotherapy: Secondary | ICD-10-CM | POA: Insufficient documentation

## 2017-04-02 DIAGNOSIS — M25551 Pain in right hip: Secondary | ICD-10-CM | POA: Insufficient documentation

## 2017-04-02 DIAGNOSIS — D649 Anemia, unspecified: Secondary | ICD-10-CM | POA: Diagnosis not present

## 2017-04-02 DIAGNOSIS — I2510 Atherosclerotic heart disease of native coronary artery without angina pectoris: Secondary | ICD-10-CM | POA: Diagnosis not present

## 2017-04-02 DIAGNOSIS — Z809 Family history of malignant neoplasm, unspecified: Secondary | ICD-10-CM | POA: Insufficient documentation

## 2017-04-02 DIAGNOSIS — E785 Hyperlipidemia, unspecified: Secondary | ICD-10-CM

## 2017-04-02 DIAGNOSIS — Z8739 Personal history of other diseases of the musculoskeletal system and connective tissue: Secondary | ICD-10-CM | POA: Diagnosis not present

## 2017-04-02 DIAGNOSIS — C9002 Multiple myeloma in relapse: Secondary | ICD-10-CM

## 2017-04-02 DIAGNOSIS — Z923 Personal history of irradiation: Secondary | ICD-10-CM | POA: Diagnosis not present

## 2017-04-02 DIAGNOSIS — M1990 Unspecified osteoarthritis, unspecified site: Secondary | ICD-10-CM | POA: Insufficient documentation

## 2017-04-02 DIAGNOSIS — F0390 Unspecified dementia without behavioral disturbance: Secondary | ICD-10-CM

## 2017-04-02 DIAGNOSIS — C9000 Multiple myeloma not having achieved remission: Secondary | ICD-10-CM

## 2017-04-02 LAB — COMPREHENSIVE METABOLIC PANEL
ALBUMIN: 3.6 g/dL (ref 3.5–5.0)
ALT: 14 U/L (ref 14–54)
AST: 22 U/L (ref 15–41)
Alkaline Phosphatase: 74 U/L (ref 38–126)
Anion gap: 8 (ref 5–15)
BILIRUBIN TOTAL: 0.5 mg/dL (ref 0.3–1.2)
BUN: 19 mg/dL (ref 6–20)
CO2: 25 mmol/L (ref 22–32)
CREATININE: 0.69 mg/dL (ref 0.44–1.00)
Calcium: 8.7 mg/dL — ABNORMAL LOW (ref 8.9–10.3)
Chloride: 104 mmol/L (ref 101–111)
GFR calc Af Amer: >60 mL/min (ref >60)
GLUCOSE: 134 mg/dL — AB (ref 65–99)
Potassium: 4.4 mmol/L (ref 3.5–5.1)
Sodium: 137 mmol/L (ref 135–145)
TOTAL PROTEIN: 6.8 g/dL (ref 6.5–8.1)

## 2017-04-02 LAB — CBC WITH DIFFERENTIAL/PLATELET
BASOS ABS: 0 10*3/uL (ref 0–0.1)
Basophils Relative: 1 %
Eosinophils Absolute: 0 10*3/uL (ref 0–0.7)
Eosinophils Relative: 0 %
HEMATOCRIT: 36.9 % (ref 35.0–47.0)
HEMOGLOBIN: 12.3 g/dL (ref 12.0–16.0)
LYMPHS PCT: 36 %
Lymphs Abs: 1.9 10*3/uL (ref 1.0–3.6)
MCH: 29.9 pg (ref 26.0–34.0)
MCHC: 33.3 g/dL (ref 32.0–36.0)
MCV: 89.7 fL (ref 80.0–100.0)
Monocytes Absolute: 0.4 10*3/uL (ref 0.2–0.9)
Monocytes Relative: 8 %
NEUTROS ABS: 2.9 10*3/uL (ref 1.4–6.5)
NEUTROS PCT: 55 %
Platelets: 230 10*3/uL (ref 150–440)
RBC: 4.11 MIL/uL (ref 3.80–5.20)
RDW: 16.2 % — ABNORMAL HIGH (ref 11.5–14.5)
WBC: 5.3 10*3/uL (ref 3.6–11.0)

## 2017-04-02 MED ORDER — PROCHLORPERAZINE MALEATE 10 MG PO TABS
10.0000 mg | ORAL_TABLET | Freq: Once | ORAL | Status: AC
  Administered 2017-04-02: 10 mg via ORAL
  Filled 2017-04-02: qty 1

## 2017-04-02 MED ORDER — OXYCODONE HCL 5 MG PO TABS: 2.5000 mg | ORAL_TABLET | Freq: Three times a day (TID) | ORAL | 0 refills | Status: DC | PRN

## 2017-04-02 MED ORDER — FENTANYL 25 MCG/HR TD PT72: 25.0000 ug | MEDICATED_PATCH | TRANSDERMAL | 0 refills | Status: DC

## 2017-04-02 MED ORDER — HEPARIN SOD (PORK) LOCK FLUSH 100 UNIT/ML IV SOLN
INTRAVENOUS | Status: AC
Start: 2017-04-02 — End: 2017-04-02
  Filled 2017-04-02: qty 5

## 2017-04-02 MED ORDER — SODIUM CHLORIDE 0.9 % IV SOLN
800.0000 mg | Freq: Once | INTRAVENOUS | Status: AC
  Administered 2017-04-02: 800 mg via INTRAVENOUS
  Filled 2017-04-02: qty 40

## 2017-04-02 MED ORDER — SODIUM CHLORIDE 0.9 % IV SOLN
Freq: Once | INTRAVENOUS | Status: AC
  Administered 2017-04-02: 09:00:00 via INTRAVENOUS
  Filled 2017-04-02: qty 1000

## 2017-04-02 MED ORDER — DIPHENHYDRAMINE HCL 25 MG PO CAPS
25.0000 mg | ORAL_CAPSULE | Freq: Once | ORAL | Status: AC
  Administered 2017-04-02: 25 mg via ORAL

## 2017-04-02 MED ORDER — HEPARIN SOD (PORK) LOCK FLUSH 100 UNIT/ML IV SOLN
500.0000 [IU] | Freq: Once | INTRAVENOUS | Status: AC
  Administered 2017-04-02: 500 [IU] via INTRAVENOUS

## 2017-04-02 MED ORDER — ACETAMINOPHEN 325 MG PO TABS
650.0000 mg | ORAL_TABLET | Freq: Once | ORAL | Status: AC
  Administered 2017-04-02: 650 mg via ORAL
  Filled 2017-04-02: qty 2

## 2017-04-02 MED ORDER — SODIUM CHLORIDE 0.9 % IV SOLN
20.0000 mg | Freq: Once | INTRAVENOUS | Status: AC
  Administered 2017-04-02: 20 mg via INTRAVENOUS
  Filled 2017-04-02: qty 2

## 2017-04-02 NOTE — Assessment & Plan Note (Addendum)
Multiple myeloma relapsed June  2018 M protein 1.6 g [slightly elevated/rising]; kappa [113] lambda light chain ratio= elevated; up to 8.7.    # Patient currently on [place] well- except for worsening fatigue [C discussion below]; improvement of the M protein noted/kappa lambda light chain noted.   # Proceed with cycle # 4 daratumumab.  Labs acceptable. Anemia improving with hemoglobin 12.  # Confusion/Delirium- sec to ongoing dementia; unlikley sec to steroids/benadryl pre-meds. Discussed re: referral to neurology. wants to wait for now.   #  Pre-medications/shingles prophylaxis steroids/Claritin/Singulair/tylenol/zantac. Given the improved tolerance; would recommend rapid infusion protocol. STOP dex day prior to infusion  # Right hip pain-Likely arthritic pain. Continue fentanyl patch 25 and also using oxycodone 5 mg as needed twice a day. STABLE. New scripts given.   #  5 weeks/labs [vacation]/infusion/MD/ Myeloma labs.   Cc;Dr. Kym Groom MD

## 2017-04-02 NOTE — Progress Notes (Signed)
[facility] OFFICE PROGRESS NOTE  Patient Care Team: Derinda Late, MD as PCP - General (Family Medicine) Forest Gleason, MD (Oncology) Christene Lye, MD (General Surgery)  No matching staging information was found for the patient.   Oncology History   1. Stage III Multiple Myeloma a. 11/20/09: SPEP showed quantitative IgG 4567 mg/dL with M spike of 3.8 g/dL and IFE showing IgG monoclonal protein with light chain specificity.  [place] urine protein electrophoresis showed M spike 27% Bence Jones Protein.  a. 11/28/09: bone marrow biopsy: - PLASMA CELL DYSCRASIA CONSISTENT WITH MULTIPLE MYELOMA (55%PLASMA CELLS ON ASPIRATE, 75% BY CD138 IMMUNOHISTOCHEMISTRY,KAPPA RESTRICTED).TRILINEAGE HEMATOPOIESIS.46 XX,  Normal FISH panel. b. 11/29/09: bone survey: There are numerous, scattered 2 mm to 5 mm concentric  radiolucencies in the bony calvaria.   Two or three similar focal lucent areas in the right  femur.  There are observed changes consistent with disc disease at multiple levels of the cervical spine and multiple levels of the lumbar spine. c. 12/07/09: right humerus: Multiple tiny lucencies noted throughout the right humerus including humeral diaphysis. No fracture. d. 8/11- 4/12 Mephalan/prednisone/velcade X 6 cycles with decrease in Mspike to 1.0 gm/dl. e.osteonecrosis of jaw and diagnosed in December of 2013(Zometa was discontinued) 7.Patient was started on Velcade and Decadron   because of progressive multiple myeloma from January of 2013. 8. Started on Kyprolis 05/13 9.progressing disease on kyphorilis September of 2013 10 patient has been started on [name] daily for 21 days and one week off Decadron day1, 18 and 15   with 4 week cycle is finished palliative radiation therapy without much relief in pain(November, 2013) 11.Palliative radiation therapy to the right shoulder (December, 2014) 12 patient was on break from pomolidomide because of infection and it has  been presumed from December 05, 2014  # July 2017- skeletal survey- stable bone lesions.   # July 10th PET- No bone involvement  # July 16th 2018- Dara; stop Pom sec cytopenia.   ------------------------------------------------------  # History of osteonecrosis of the jaw- not on Zometa.     Multiple myeloma not having achieved remission ([place])     INTERVAL HISTORY: Patient is a fair historian at best.  [name] 83 y.o.  female pleasant patient With mild-moderate dementia and above history of Multiple myeloma currently daratumumab Status post cycle # 3; day- 15 Is here for follow-up.  In the interim patient had a good trip to the beach with her family.  As per the family patient continues to have episodes of confusion- intermittent. Worsening more so over the last many weeks.  She denies any worsening pain in the right hip./ She continues to wear the fentanyl patch and use oxycodone as needed. No nausea no vomiting. No skin rash. No diarrhea. No chest pain or shortness of breath or cough. Denies any tingling or numbness.   REVIEW OF SYSTEMS:  A complete 10 point review of system is done which is negative except mentioned above/history of present illness.   PAST MEDICAL HISTORY :  Past Medical History:  Diagnosis Date  . Anxiety   . Arthritis   . Atrial fibrillation ([place])   . Coronary arteriosclerosis   . History of blood transfusion 2014  . Hyperlipidemia   . Hypertension   . Multiple myeloma ([place])   . Multiple myeloma in relapse ([place]) 10/29/2014  . Osteonecrosis of jaw ([place])    left    PAST SURGICAL HISTORY :   Past Surgical History:  Procedure Laterality  Date  . ABDOMINAL HYSTERECTOMY    . APPENDECTOMY    . PARTIAL COLECTOMY    . TOTAL KNEE REVISION Left     FAMILY HISTORY :   Family History  Problem Relation Age of Onset  . Cancer Father   . Heart attack Mother     SOCIAL HISTORY:   Social History  Substance Use Topics  . Smoking status: Never  Smoker  . Smokeless tobacco: Never Used  . Alcohol use 1.2 oz/week    2 Glasses of wine per week    ALLERGIES:  is allergic to biaxin [clarithromycin]; demerol [meperidine]; hydrocodone; sulfa antibiotics; tramadol; and ultracet [tramadol-acetaminophen].  MEDICATIONS:  Current Outpatient Prescriptions  Medication Sig Dispense Refill  . acetaminophen (TYLENOL 8 HOUR) 650 MG CR tablet Take 650 mg by mouth. 1 tablet twice a day x 4 days. Start taking 2 days prior to [facility] treatment, on the day of treatment and day after treatment.    . Biotin 1 MG CAPS Take 1 capsule by mouth daily.     . Calcium-Magnesium-Vitamin D (CALCIUM 500 PO) Take 1 tablet by mouth daily.     Marland Kitchen dexamethasone (DECADRON) 4 MG tablet Take 1 tablet (4 mg total) by mouth 2 (two) times daily. Start 2 days prior to infusion; Take it for 4 days. (Patient taking differently: Take 4 mg by mouth 2 (two) times daily. Start 1 days prior to infusion) 60 tablet 3  . Docusate Sodium (COLACE PO) Take 2 tablets by mouth 2 (two) times daily.    . fentaNYL (DURAGESIC - DOSED MCG/HR) 25 MCG/HR patch Place 1 patch (25 mcg total) onto the skin every 3 (three) days. 10 patch 0  . ferrous sulfate 325 (65 FE) MG EC tablet Take 325 mg by mouth daily.    Marland Kitchen lidocaine-prilocaine (EMLA) cream Apply 1 application topically as needed. Apply small amount to port site at least 1 hour prior to it being accessed, cover with plastic wrap 30 g 1  . loratadine (CLARITIN) 10 MG tablet Take 10 mg by mouth. 1 tablet a day x 4 days. Start taking 2 days prior to [name] treatment, on the day of treatment and day after treatment.    . mirtazapine (REMERON) 30 MG tablet Take 1 tablet (30 mg total) by mouth at bedtime. 60 tablet 2  . montelukast (SINGULAIR) 10 MG tablet Take 1 tablet (10 mg total) by mouth at bedtime. Start 2 days prior to infusion. Take it for 4 days. 60 tablet 3  . Multiple Vitamins-Minerals (CENTRUM SILVER 50+WOMEN PO) Take 1 tablet by mouth  daily.     . ondansetron (ZOFRAN) 4 MG tablet Take 1 tablet (4 mg total) by mouth every 8 (eight) hours as needed for nausea or vomiting. 20 tablet 0  . oxyCODONE (OXY IR/ROXICODONE) 5 MG immediate release tablet Take 0.5 tablets (2.5 mg total) by mouth every 8 (eight) hours as needed for severe pain. 30 tablet 0  . polyethylene glycol (MIRALAX / GLYCOLAX) packet Take 17 g by mouth daily as needed for mild constipation.    . pomalidomide (POMALYST) 4 MG capsule TAKE 1 CAPSULE BY MOUTH DAILY FOR 14 DAYS ON FOLLOWED BY 14 DAYS OFF. 14 capsule 0  . prochlorperazine (COMPAZINE) 10 MG tablet Take 1 tablet (10 mg total) by mouth every 8 (eight) hours as needed for nausea or vomiting. 30 tablet 1  . risperiDONE (RISPERDAL) 0.25 MG tablet Take 2 tablets (0.5 mg total) by mouth at bedtime. 60 tablet 3  .  acyclovir (ZOVIRAX) 400 MG tablet One pill a day [to prevent shingles] (Patient not taking: Reported on 04/02/2017) 30 tablet 3  . ranitidine (ZANTAC) 150 MG capsule Take 150 mg by mouth 2 (two) times daily. 1 tablet twice a day x 4 days. Start taking 2 days prior to [facility] treatment, on the day of treatment and day after treatment.     No current facility-administered medications for this visit.    Facility-Administered Medications Ordered in Other Visits  Medication Dose Route Frequency Provider Last Rate Last Dose  . sodium chloride 0.9 % injection 10 mL  10 mL Intravenous PRN Forest Gleason, MD   10 mL at 03/01/15 1305    PHYSICAL EXAMINATION: ECOG PERFORMANCE STATUS: 0 - Asymptomatic  BP 131/75 (BP Location: Right Arm, Patient Position: Sitting)   Pulse 89   Temp (!) 97.3 F (36.3 C) (Tympanic)   Resp 14   Wt 101 lb (45.8 kg)   BMI 17.89 kg/m   Filed Weights   04/02/17 0907  Weight: 101 lb (45.8 kg)    GENERAL: frail-appearing elderly female patiett Alert, no distress and comfortable. Accompanied by her care giver/ daughter-in-law. Patient is walking. EYES: no pallor or  icterus OROPHARYNX: no thrush or ulceration; good dentition  NECK: supple, no masses felt LYMPH:  no palpable lymphadenopathy in the cervical, axillary or inguinal regions LUNGS: clear to auscultation and  No wheeze or crackles HEART/CVS: regular rate & rhythm and no murmurs; no swelling of the legs. No tenderness.  ABDOMEN:abdomen soft, non-tender and normal bowel sounds Musculoskeletal:no cyanosis of digits and no clubbing  PSYCH: alert & oriented x 3 with fluent speech NEURO: no focal motor/sensory deficits SKIN:  no rashes or significant lesions  LABORATORY DATA:  I have reviewed the data as listed    Component Value Date/Time   NA 137 04/02/2017 0846   NA 138 09/19/2014 1118   K 4.4 04/02/2017 0846   K 4.0 09/19/2014 1118   CL 104 04/02/2017 0846   CL 103 09/19/2014 1118   CO2 25 04/02/2017 0846   CO2 29 09/19/2014 1118   GLUCOSE 134 (H) 04/02/2017 0846   GLUCOSE 91 09/19/2014 1118   BUN 19 04/02/2017 0846   BUN 15 09/19/2014 1118   CREATININE 0.69 04/02/2017 0846   CREATININE 0.69 09/19/2014 1118   CALCIUM 8.7 (L) 04/02/2017 0846   CALCIUM 9.0 09/19/2014 1118   PROT 6.8 04/02/2017 0846   PROT 6.8 09/19/2014 1118   ALBUMIN 3.6 04/02/2017 0846   ALBUMIN 3.9 09/19/2014 1118   AST 22 04/02/2017 0846   AST 20 09/19/2014 1118   ALT 14 04/02/2017 0846   ALT 16 09/19/2014 1118   ALKPHOS 74 04/02/2017 0846   ALKPHOS 85 09/19/2014 1118   BILITOT 0.5 04/02/2017 0846   BILITOT 0.6 09/19/2014 1118   GFRNONAA >60 04/02/2017 0846   GFRNONAA >60 09/19/2014 1118   GFRAA >60 04/02/2017 0846   GFRAA >60 09/19/2014 1118    No results found for: SPEP, UPEP  Lab Results  Component Value Date   WBC 5.3 04/02/2017   NEUTROABS 2.9 04/02/2017   HGB 12.3 04/02/2017   HCT 36.9 04/02/2017   MCV 89.7 04/02/2017   PLT 230 04/02/2017      Chemistry      Component Value Date/Time   NA 137 04/02/2017 0846   NA 138 09/19/2014 1118   K 4.4 04/02/2017 0846   K 4.0 09/19/2014 1118    CL 104 04/02/2017 0846   CL  103 09/19/2014 1118   CO2 25 04/02/2017 0846   CO2 29 09/19/2014 1118   BUN 19 04/02/2017 0846   BUN 15 09/19/2014 1118   CREATININE 0.69 04/02/2017 0846   CREATININE 0.69 09/19/2014 1118      Component Value Date/Time   CALCIUM 8.7 (L) 04/02/2017 0846   CALCIUM 9.0 09/19/2014 1118   ALKPHOS 74 04/02/2017 0846   ALKPHOS 85 09/19/2014 1118   AST 22 04/02/2017 0846   AST 20 09/19/2014 1118   ALT 14 04/02/2017 0846   ALT 16 09/19/2014 1118   BILITOT 0.5 04/02/2017 0846   BILITOT 0.6 09/19/2014 1118      IMPRESSION: No FDG avid myelomatous lesions are evident.  Mildly hypermetabolic mediastinal and bilateral perihilar lymph nodes, likely reactive.   Electronically Signed   By: Julian Hy M.D.   On: 12/13/2016 11:41  Results for LILLYONNA, ARMSTEAD (MRN [id number]) as of 02/19/2017 09:10  Ref. Range 10/29/2016 10:53 10/29/2016 10:58 12/03/2016 10:54 02/05/2017 08:09 02/05/2017 08:24  Kappa free light chain Latest Ref Range: 3.3 - 19.4 mg/L 113.3 (H)  110.6 (H)  21.6 (H)  Lamda free light chains Latest Ref Range: 5.7 - 26.3 mg/L 12.9  11.6  5.6 (L)  Kappa, lamda light chain ratio Latest Ref Range: 0.26 - 1.65  8.78 (H)  9.53 (H)  3.86 (H)  M Protein SerPl Elph-Mcnc Latest Ref Range: Not Observed g/dL  1.4 (H) 1.6 (H) 1.0 (H)      RADIOGRAPHIC STUDIES: I have personally reviewed the radiological images as listed and agreed with the findings in the report. No results found.   ASSESSMENT & PLAN:  Multiple myeloma not having achieved remission [facility]) Multiple myeloma relapsed June  2018 M protein 1.6 g [slightly elevated/rising]; kappa [113] lambda light chain ratio= elevated; up to 8.7.    # Patient currently on [place] well- except for worsening fatigue [C discussion below]; improvement of the M protein noted/kappa lambda light chain noted.   # Proceed with cycle # 4 daratumumab.  Labs acceptable. Anemia improving with hemoglobin  12.  # Confusion/Delirium- sec to ongoing dementia; unlikley sec to steroids/benadryl pre-meds. Discussed re: referral to neurology. wants to wait for now.   #  Pre-medications/shingles prophylaxis steroids/Claritin/Singulair/tylenol/zantac. Given the improved tolerance; would recommend rapid infusion protocol. STOP dex day prior to infusion  # Right hip pain-Likely arthritic pain. Continue fentanyl patch 25 and also using oxycodone 5 mg as needed twice a day. STABLE. New scripts given.   #  5 weeks/labs [vacation]/infusion/MD/ Myeloma labs.   Cc;Dr. Kym Groom MD  Orders Placed This Encounter  Procedures  . CBC with Differential    Standing Status:   Future    Standing Expiration Date:   04/02/2018  . Comprehensive metabolic panel    Standing Status:   Future    Standing Expiration Date:   04/02/2018  . Multiple Myeloma Panel (SPEP&IFE w/QIG)    Standing Status:   Future    Standing Expiration Date:   04/02/2018  . Kappa/lambda light chains    Standing Status:   Future    Standing Expiration Date:   04/02/2018   All questions were answered. The patient knows to call the clinic with any problems, questions or concerns.      Cammie Sickle, MD 04/02/2017 5:48 PM

## 2017-04-02 NOTE — Progress Notes (Signed)
Patient's daughter states that Ms. Steelman hasn't been taking the acyclovir daily, but only as a pre-med for chemo treatment. She is unsure if this is correct. She received her Flu Vaccine yesterday and is planning to get the Pneumonia vaccine next week. She is questioning whether or not to get the Shingles vaccine as well. Patient denies having any pain today, and states that she feels well. She is requesting refills on the Duragesic patch and the Oxycodone.

## 2017-04-03 LAB — MULTIPLE MYELOMA PANEL, SERUM
ALBUMIN SERPL ELPH-MCNC: 3.2 g/dL (ref 2.9–4.4)
ALBUMIN/GLOB SERPL: 1.1 (ref 0.7–1.7)
ALPHA 1: 0.3 g/dL (ref 0.0–0.4)
ALPHA2 GLOB SERPL ELPH-MCNC: 0.7 g/dL (ref 0.4–1.0)
B-Globulin SerPl Elph-Mcnc: 0.9 g/dL (ref 0.7–1.3)
Gamma Glob SerPl Elph-Mcnc: 1.2 g/dL (ref 0.4–1.8)
Globulin, Total: 3.1 g/dL (ref 2.2–3.9)
IGA: 15 mg/dL — AB (ref 64–422)
IGG (IMMUNOGLOBIN G), SERUM: 1177 mg/dL (ref 700–1600)
IGM (IMMUNOGLOBULIN M), SRM: 27 mg/dL (ref 26–217)
M Protein SerPl Elph-Mcnc: 1 g/dL — ABNORMAL HIGH
TOTAL PROTEIN ELP: 6.3 g/dL (ref 6.0–8.5)

## 2017-04-03 LAB — KAPPA/LAMBDA LIGHT CHAINS
Kappa free light chain: 22.6 mg/L — ABNORMAL HIGH (ref 3.3–19.4)
Kappa, lambda light chain ratio: 4.11 — ABNORMAL HIGH (ref 0.26–1.65)
Lambda free light chains: 5.5 mg/L — ABNORMAL LOW (ref 5.7–26.3)

## 2017-04-11 ENCOUNTER — Other Ambulatory Visit: Payer: Self-pay | Admitting: Internal Medicine

## 2017-04-22 DIAGNOSIS — Z23 Encounter for immunization: Secondary | ICD-10-CM | POA: Diagnosis not present

## 2017-04-22 DIAGNOSIS — L608 Other nail disorders: Secondary | ICD-10-CM | POA: Diagnosis not present

## 2017-04-25 ENCOUNTER — Other Ambulatory Visit: Payer: Self-pay | Admitting: Internal Medicine

## 2017-05-07 ENCOUNTER — Inpatient Hospital Stay (HOSPITAL_BASED_OUTPATIENT_CLINIC_OR_DEPARTMENT_OTHER): Payer: Medicare Other | Admitting: Internal Medicine

## 2017-05-07 ENCOUNTER — Encounter: Payer: Self-pay | Admitting: Internal Medicine

## 2017-05-07 ENCOUNTER — Inpatient Hospital Stay: Payer: Medicare Other

## 2017-05-07 ENCOUNTER — Inpatient Hospital Stay: Payer: Medicare Other | Attending: Internal Medicine

## 2017-05-07 VITALS — BP 169/87 | HR 84 | Temp 97.8°F | Resp 16 | Wt 105.0 lb

## 2017-05-07 DIAGNOSIS — C9000 Multiple myeloma not having achieved remission: Secondary | ICD-10-CM

## 2017-05-07 DIAGNOSIS — R196 Halitosis: Secondary | ICD-10-CM | POA: Insufficient documentation

## 2017-05-07 DIAGNOSIS — Z79899 Other long term (current) drug therapy: Secondary | ICD-10-CM | POA: Diagnosis not present

## 2017-05-07 DIAGNOSIS — D649 Anemia, unspecified: Secondary | ICD-10-CM

## 2017-05-07 DIAGNOSIS — M25551 Pain in right hip: Secondary | ICD-10-CM

## 2017-05-07 DIAGNOSIS — G893 Neoplasm related pain (acute) (chronic): Secondary | ICD-10-CM | POA: Insufficient documentation

## 2017-05-07 DIAGNOSIS — I4891 Unspecified atrial fibrillation: Secondary | ICD-10-CM

## 2017-05-07 DIAGNOSIS — Z809 Family history of malignant neoplasm, unspecified: Secondary | ICD-10-CM | POA: Diagnosis not present

## 2017-05-07 DIAGNOSIS — F0390 Unspecified dementia without behavioral disturbance: Secondary | ICD-10-CM | POA: Diagnosis not present

## 2017-05-07 DIAGNOSIS — I2510 Atherosclerotic heart disease of native coronary artery without angina pectoris: Secondary | ICD-10-CM

## 2017-05-07 DIAGNOSIS — Z8739 Personal history of other diseases of the musculoskeletal system and connective tissue: Secondary | ICD-10-CM | POA: Insufficient documentation

## 2017-05-07 DIAGNOSIS — I10 Essential (primary) hypertension: Secondary | ICD-10-CM

## 2017-05-07 DIAGNOSIS — Z5112 Encounter for antineoplastic immunotherapy: Secondary | ICD-10-CM | POA: Diagnosis present

## 2017-05-07 DIAGNOSIS — E785 Hyperlipidemia, unspecified: Secondary | ICD-10-CM

## 2017-05-07 DIAGNOSIS — F419 Anxiety disorder, unspecified: Secondary | ICD-10-CM | POA: Insufficient documentation

## 2017-05-07 LAB — CBC WITH DIFFERENTIAL/PLATELET
Basophils Absolute: 0.1 10*3/uL (ref 0–0.1)
Basophils Relative: 1 %
EOS ABS: 0.1 10*3/uL (ref 0–0.7)
Eosinophils Relative: 3 %
HEMATOCRIT: 36.5 % (ref 35.0–47.0)
HEMOGLOBIN: 12.2 g/dL (ref 12.0–16.0)
LYMPHS ABS: 1 10*3/uL (ref 1.0–3.6)
Lymphocytes Relative: 22 %
MCH: 29.9 pg (ref 26.0–34.0)
MCHC: 33.4 g/dL (ref 32.0–36.0)
MCV: 89.7 fL (ref 80.0–100.0)
MONO ABS: 0.5 10*3/uL (ref 0.2–0.9)
MONOS PCT: 10 %
NEUTROS PCT: 64 %
Neutro Abs: 3 10*3/uL (ref 1.4–6.5)
Platelets: 209 10*3/uL (ref 150–440)
RBC: 4.08 MIL/uL (ref 3.80–5.20)
RDW: 15.7 % — AB (ref 11.5–14.5)
WBC: 4.7 10*3/uL (ref 3.6–11.0)

## 2017-05-07 LAB — COMPREHENSIVE METABOLIC PANEL
ALBUMIN: 3.4 g/dL — AB (ref 3.5–5.0)
ALK PHOS: 72 U/L (ref 38–126)
ALT: 14 U/L (ref 14–54)
AST: 19 U/L (ref 15–41)
Anion gap: 6 (ref 5–15)
BUN: 20 mg/dL (ref 6–20)
CHLORIDE: 105 mmol/L (ref 101–111)
CO2: 26 mmol/L (ref 22–32)
CREATININE: 0.73 mg/dL (ref 0.44–1.00)
Calcium: 8.5 mg/dL — ABNORMAL LOW (ref 8.9–10.3)
GFR calc non Af Amer: >60 mL/min (ref >60)
GLUCOSE: 122 mg/dL — AB (ref 65–99)
Potassium: 4.3 mmol/L (ref 3.5–5.1)
SODIUM: 137 mmol/L (ref 135–145)
Total Bilirubin: 0.5 mg/dL (ref 0.3–1.2)
Total Protein: 6.4 g/dL — ABNORMAL LOW (ref 6.5–8.1)

## 2017-05-07 MED ORDER — SODIUM CHLORIDE 0.9% FLUSH
10.0000 mL | INTRAVENOUS | Status: DC | PRN
  Administered 2017-05-07: 10 mL via INTRAVENOUS
  Filled 2017-05-07: qty 10

## 2017-05-07 MED ORDER — ACETAMINOPHEN 325 MG PO TABS
650.0000 mg | ORAL_TABLET | Freq: Once | ORAL | Status: AC
  Administered 2017-05-07: 650 mg via ORAL
  Filled 2017-05-07: qty 2

## 2017-05-07 MED ORDER — SODIUM CHLORIDE 0.9 % IV SOLN
20.0000 mg | Freq: Once | INTRAVENOUS | Status: AC
  Administered 2017-05-07: 20 mg via INTRAVENOUS
  Filled 2017-05-07: qty 2

## 2017-05-07 MED ORDER — SODIUM CHLORIDE 0.9 % IV SOLN
800.0000 mg | Freq: Once | INTRAVENOUS | Status: AC
  Administered 2017-05-07: 800 mg via INTRAVENOUS
  Filled 2017-05-07: qty 40

## 2017-05-07 MED ORDER — SODIUM CHLORIDE 0.9 % IV SOLN
Freq: Once | INTRAVENOUS | Status: AC
  Administered 2017-05-07: 10:00:00 via INTRAVENOUS
  Filled 2017-05-07: qty 1000

## 2017-05-07 MED ORDER — PROCHLORPERAZINE MALEATE 10 MG PO TABS
10.0000 mg | ORAL_TABLET | Freq: Once | ORAL | Status: AC
  Administered 2017-05-07: 10 mg via ORAL
  Filled 2017-05-07: qty 1

## 2017-05-07 MED ORDER — OXYCODONE HCL 5 MG PO TABS: 2.5000 mg | ORAL_TABLET | Freq: Three times a day (TID) | ORAL | 0 refills | Status: DC | PRN

## 2017-05-07 MED ORDER — FENTANYL 25 MCG/HR TD PT72: 25.0000 ug | MEDICATED_PATCH | TRANSDERMAL | 0 refills | Status: DC

## 2017-05-07 MED ORDER — HEPARIN SOD (PORK) LOCK FLUSH 100 UNIT/ML IV SOLN
500.0000 [IU] | Freq: Once | INTRAVENOUS | Status: AC
  Administered 2017-05-07: 500 [IU] via INTRAVENOUS
  Filled 2017-05-07: qty 5

## 2017-05-07 MED ORDER — DIPHENHYDRAMINE HCL 25 MG PO CAPS
25.0000 mg | ORAL_CAPSULE | Freq: Once | ORAL | Status: AC
  Administered 2017-05-07: 25 mg via ORAL
  Filled 2017-05-07: qty 1

## 2017-05-07 NOTE — Progress Notes (Signed)
[place] OFFICE PROGRESS NOTE  Patient Care Team: Derinda Late, MD as PCP - General (Family Medicine) Forest Gleason, MD (Oncology) Christene Lye, MD (General Surgery)  No matching staging information was found for the patient.   Oncology History   1. Stage III Multiple Myeloma a. 11/20/09: SPEP showed quantitative IgG 4567 mg/dL with M spike of 3.8 g/dL and IFE showing IgG monoclonal protein with light chain specificity.  [place] urine protein electrophoresis showed M spike 27% Bence Jones Protein.  a. 11/28/09: bone marrow biopsy: - PLASMA CELL DYSCRASIA CONSISTENT WITH MULTIPLE MYELOMA (55%PLASMA CELLS ON ASPIRATE, 75% BY CD138 IMMUNOHISTOCHEMISTRY,KAPPA RESTRICTED).TRILINEAGE HEMATOPOIESIS.46 XX,  Normal FISH panel. b. 11/29/09: bone survey: There are numerous, scattered 2 mm to 5 mm concentric  radiolucencies in the bony calvaria.   Two or three similar focal lucent areas in the right  femur.  There are observed changes consistent with disc disease at multiple levels of the cervical spine and multiple levels of the lumbar spine. c. 12/07/09: right humerus: Multiple tiny lucencies noted throughout the right humerus including humeral diaphysis. No fracture. d. 8/11- 4/12 Mephalan/prednisone/velcade X 6 cycles with decrease in Mspike to 1.0 gm/dl. e.osteonecrosis of jaw and diagnosed in December of 2013(Zometa was discontinued) 7.Patient was started on Velcade and Decadron   because of progressive multiple myeloma from January of 2013. 8. Started on Kyprolis 05/13 9.progressing disease on kyphorilis September of 2013 10 patient has been started on [name] daily for 21 days and one week off Decadron day1, 18 and 15   with 4 week cycle is finished palliative radiation therapy without much relief in pain(November, 2013) 11.Palliative radiation therapy to the right shoulder (December, 2014) 12 patient was on break from pomolidomide because of infection and it has  been presumed from December 05, 2014  # July 2017- skeletal survey- stable bone lesions.   # July 10th PET- No bone involvement  # July 16th 2018- Dara; stop Pom sec cytopenia.   ------------------------------------------------------  # History of osteonecrosis of the jaw- not on Zometa.     Multiple myeloma not having achieved remission ([place])     INTERVAL HISTORY: Patient is a fair historian at best.  [name] 83 y.o.  female pleasant patient With mild-moderate dementia and above history of Multiple myeloma currently daratumumab Status post cycle # 4  Is here for follow-up.  As per family patient is doing okay.  However as per family she is noted to have "bad breath".  Patient states to brushing her teeth twice a day.  She denies any jaw pain.  Denies any shortness of breath or cough.  She denies any worsening pain in the right hip./ She continues to wear the fentanyl patch and use oxycodone as needed. No nausea no vomiting. No skin rash. No diarrhea.  Denies any tingling or numbness.   REVIEW OF SYSTEMS:  A complete 10 point review of system is done which is negative except mentioned above/history of present illness.   PAST MEDICAL HISTORY :  Past Medical History:  Diagnosis Date  . Anxiety   . Arthritis   . Atrial fibrillation ([place])   . Coronary arteriosclerosis   . History of blood transfusion 2014  . Hyperlipidemia   . Hypertension   . Multiple myeloma ([place])   . Multiple myeloma in relapse ([place]) 10/29/2014  . Osteonecrosis of jaw ([place])    left    PAST SURGICAL HISTORY :   Past Surgical History:  Procedure Laterality Date  .  ABDOMINAL HYSTERECTOMY    . APPENDECTOMY    . PARTIAL COLECTOMY    . TOTAL KNEE REVISION Left     FAMILY HISTORY :   Family History  Problem Relation Age of Onset  . Cancer Father   . Heart attack Mother     SOCIAL HISTORY:   Social History   Tobacco Use  . Smoking status: Never Smoker  . Smokeless tobacco: Never Used   Substance Use Topics  . Alcohol use: Yes    Alcohol/week: 1.2 oz    Types: 2 Glasses of wine per week  . Drug use: No    ALLERGIES:  is allergic to biaxin [clarithromycin]; demerol [meperidine]; hydrocodone; sulfa antibiotics; tramadol; and ultracet [tramadol-acetaminophen].  MEDICATIONS:  Current Outpatient Medications  Medication Sig Dispense Refill  . acetaminophen (TYLENOL 8 HOUR) 650 MG CR tablet Take 650 mg by mouth. 1 tablet twice a day x 4 days. Start taking 2 days prior to [facility] treatment, on the day of treatment and day after treatment.    Marland Kitchen acyclovir (ZOVIRAX) 400 MG tablet TAKE 1 TABLET BY MOUTH ONCE DAILY TO  PREVENT  SHINGLES 30 tablet 3  . Biotin 1 MG CAPS Take 1 capsule by mouth daily.     . Calcium-Magnesium-Vitamin D (CALCIUM 500 PO) Take 1 tablet by mouth daily.     Marland Kitchen dexamethasone (DECADRON) 4 MG tablet Take 1 tablet (4 mg total) by mouth 2 (two) times daily. Start 2 days prior to infusion; Take it for 4 days. (Patient taking differently: Take 4 mg by mouth 2 (two) times daily. Start 1 days prior to infusion) 60 tablet 3  . Docusate Sodium (COLACE PO) Take 2 tablets by mouth 2 (two) times daily.    . fentaNYL (DURAGESIC - DOSED MCG/HR) 25 MCG/HR patch Place 1 patch (25 mcg total) onto the skin every 3 (three) days. 10 patch 0  . ferrous sulfate 325 (65 FE) MG EC tablet Take 325 mg by mouth daily.    Marland Kitchen lidocaine-prilocaine (EMLA) cream Apply 1 application topically as needed. Apply small amount to port site at least 1 hour prior to it being accessed, cover with plastic wrap 30 g 1  . loratadine (CLARITIN) 10 MG tablet Take 10 mg by mouth. 1 tablet a day x 4 days. Start taking 2 days prior to [facility] treatment, on the day of treatment and day after treatment.    . mirtazapine (REMERON) 30 MG tablet Take 1 tablet (30 mg total) by mouth at bedtime. 60 tablet 2  . montelukast (SINGULAIR) 10 MG tablet Take 1 tablet (10 mg total) by mouth at bedtime. Start 2 days prior to  infusion. Take it for 4 days. 60 tablet 3  . Multiple Vitamins-Minerals (CENTRUM SILVER 50+WOMEN PO) Take 1 tablet by mouth daily.     . ondansetron (ZOFRAN) 4 MG tablet Take 1 tablet (4 mg total) by mouth every 8 (eight) hours as needed for nausea or vomiting. 20 tablet 0  . oxyCODONE (OXY IR/ROXICODONE) 5 MG immediate release tablet Take 0.5 tablets (2.5 mg total) by mouth every 8 (eight) hours as needed for severe pain. 30 tablet 0  . polyethylene glycol (MIRALAX / GLYCOLAX) packet Take 17 g by mouth daily as needed for mild constipation.    . pomalidomide (POMALYST) 4 MG capsule TAKE 1 CAPSULE BY MOUTH DAILY FOR 14 DAYS ON FOLLOWED BY 14 DAYS OFF. 14 capsule 0  . prochlorperazine (COMPAZINE) 10 MG tablet Take 1 tablet (10 mg total) by  mouth every 8 (eight) hours as needed for nausea or vomiting. 30 tablet 1  . ranitidine (ZANTAC) 150 MG capsule Take 150 mg by mouth 2 (two) times daily. 1 tablet twice a day x 4 days. Start taking 2 days prior to [facility] treatment, on the day of treatment and day after treatment.    . risperiDONE (RISPERDAL) 0.25 MG tablet TAKE 2 TABLETS BY MOUTH AT BEDTIME 60 tablet 3   No current facility-administered medications for this visit.    Facility-Administered Medications Ordered in Other Visits  Medication Dose Route Frequency Provider Last Rate Last Dose  . sodium chloride 0.9 % injection 10 mL  10 mL Intravenous PRN Forest Gleason, MD   10 mL at 03/01/15 1305    PHYSICAL EXAMINATION: ECOG PERFORMANCE STATUS: 0 - Asymptomatic  BP (!) 169/87 (BP Location: Left Arm, Patient Position: Sitting)   Pulse 84   Temp 97.8 F (36.6 C) (Tympanic)   Resp 16   Wt 105 lb (47.6 kg)   BMI 18.60 kg/m   Filed Weights   05/07/17 0857 05/07/17 0904  Weight: 105 lb (47.6 kg) 105 lb (47.6 kg)    GENERAL: frail-appearing elderly female patiett Alert, no distress and comfortable. Accompanied by her care giver/ daughter-in-law. Patient is walking. EYES: no pallor or  icterus OROPHARYNX: no thrush or ulceration; good dentition; "bad breath." NECK: supple, no masses felt LYMPH:  no palpable lymphadenopathy in the cervical, axillary or inguinal regions LUNGS: clear to auscultation and  No wheeze or crackles HEART/CVS: regular rate & rhythm and no murmurs; no swelling of the legs. No tenderness.  ABDOMEN:abdomen soft, non-tender and normal bowel sounds Musculoskeletal:no cyanosis of digits and no clubbing  PSYCH: alert & oriented x 3 with fluent speech NEURO: no focal motor/sensory deficits SKIN:  no rashes or significant lesions  LABORATORY DATA:  I have reviewed the data as listed    Component Value Date/Time   NA 137 05/07/2017 0835   NA 138 09/19/2014 1118   K 4.3 05/07/2017 0835   K 4.0 09/19/2014 1118   CL 105 05/07/2017 0835   CL 103 09/19/2014 1118   CO2 26 05/07/2017 0835   CO2 29 09/19/2014 1118   GLUCOSE 122 (H) 05/07/2017 0835   GLUCOSE 91 09/19/2014 1118   BUN 20 05/07/2017 0835   BUN 15 09/19/2014 1118   CREATININE 0.73 05/07/2017 0835   CREATININE 0.69 09/19/2014 1118   CALCIUM 8.5 (L) 05/07/2017 0835   CALCIUM 9.0 09/19/2014 1118   PROT 6.4 (L) 05/07/2017 0835   PROT 6.8 09/19/2014 1118   ALBUMIN 3.4 (L) 05/07/2017 0835   ALBUMIN 3.9 09/19/2014 1118   AST 19 05/07/2017 0835   AST 20 09/19/2014 1118   ALT 14 05/07/2017 0835   ALT 16 09/19/2014 1118   ALKPHOS 72 05/07/2017 0835   ALKPHOS 85 09/19/2014 1118   BILITOT 0.5 05/07/2017 0835   BILITOT 0.6 09/19/2014 1118   GFRNONAA >60 05/07/2017 0835   GFRNONAA >60 09/19/2014 1118   GFRAA >60 05/07/2017 0835   GFRAA >60 09/19/2014 1118    No results found for: SPEP, UPEP  Lab Results  Component Value Date   WBC 4.7 05/07/2017   NEUTROABS 3.0 05/07/2017   HGB 12.2 05/07/2017   HCT 36.5 05/07/2017   MCV 89.7 05/07/2017   PLT 209 05/07/2017      Chemistry      Component Value Date/Time   NA 137 05/07/2017 0835   NA 138 09/19/2014 1118   K  4.3 05/07/2017 0835    K 4.0 09/19/2014 1118   CL 105 05/07/2017 0835   CL 103 09/19/2014 1118   CO2 26 05/07/2017 0835   CO2 29 09/19/2014 1118   BUN 20 05/07/2017 0835   BUN 15 09/19/2014 1118   CREATININE 0.73 05/07/2017 0835   CREATININE 0.69 09/19/2014 1118      Component Value Date/Time   CALCIUM 8.5 (L) 05/07/2017 0835   CALCIUM 9.0 09/19/2014 1118   ALKPHOS 72 05/07/2017 0835   ALKPHOS 85 09/19/2014 1118   AST 19 05/07/2017 0835   AST 20 09/19/2014 1118   ALT 14 05/07/2017 0835   ALT 16 09/19/2014 1118   BILITOT 0.5 05/07/2017 0835   BILITOT 0.6 09/19/2014 1118      IMPRESSION: No FDG avid myelomatous lesions are evident.  Mildly hypermetabolic mediastinal and bilateral perihilar lymph nodes, likely reactive.   Electronically Signed   By: Julian Hy M.D.   On: 12/13/2016 11:41  Results for CHAR, FELTMAN (MRN [id number]) as of 02/19/2017 09:10  Ref. Range 10/29/2016 10:53 10/29/2016 10:58 12/03/2016 10:54 02/05/2017 08:09 02/05/2017 08:24  Kappa free light chain Latest Ref Range: 3.3 - 19.4 mg/L 113.3 (H)  110.6 (H)  21.6 (H)  Lamda free light chains Latest Ref Range: 5.7 - 26.3 mg/L 12.9  11.6  5.6 (L)  Kappa, lamda light chain ratio Latest Ref Range: 0.26 - 1.65  8.78 (H)  9.53 (H)  3.86 (H)  M Protein SerPl Elph-Mcnc Latest Ref Range: Not Observed g/dL  1.4 (H) 1.6 (H) 1.0 (H)      RADIOGRAPHIC STUDIES: I have personally reviewed the radiological images as listed and agreed with the findings in the report. No results found.   ASSESSMENT & PLAN:  Multiple myeloma not having achieved remission [facility]) Multiple myeloma relapsed June  2018 M protein 1.6 g [slightly elevated/rising]; kappa [113] lambda light chain ratio= elevated; up to 8.7.    # Patient currently on [name] well- except for worsening fatigue [C discussion below]; improvement of the M protein noted/kappa lambda light chain noted.   # Proceed with cycle # 5 daratumumab monthly.  Labs acceptable.  Anemia improving with hemoglobin 12.  # Confusion/Delirium- sec to ongoing dementia; unlikley sec to steroids/benadryl pre-meds. STABLE.  # Elevated blood pressure- recommend checking at home; bring a log.   #Continue  pre-medications/shingles prophylaxis/ steroids/Claritin/Singulair/tylenol/zantac.   # Right hip pain-Likely arthritic pain. Continue fentanyl patch 25 and also using oxycodone 5 mg as needed twice a day. STABLE. New scripts given.   # Halitosis- no obvious signs of infection; recommend dentist evaluation. Recommend dental evaluation.   #  5 weeks/labs [vacation]/infusion/MD/ Myeloma labs.   Cc;Dr. Kym Groom MD  Orders Placed This Encounter  Procedures  . CBC with Differential/Platelet    Standing Status:   Future    Standing Expiration Date:   05/07/2018  . Comprehensive metabolic panel    Standing Status:   Future    Standing Expiration Date:   05/07/2018  . Kappa/lambda light chains    Standing Status:   Future    Standing Expiration Date:   05/07/2018  . Multiple Myeloma Panel (SPEP&IFE w/QIG)    Standing Status:   Future    Standing Expiration Date:   05/07/2018   All questions were answered. The patient knows to call the clinic with any problems, questions or concerns.      Cammie Sickle, MD 05/08/2017 9:18 AM

## 2017-05-07 NOTE — Assessment & Plan Note (Addendum)
Multiple myeloma relapsed June  2018 M protein 1.6 g [slightly elevated/rising]; kappa [113] lambda light chain ratio= elevated; up to 8.7.    # Patient currently on [place] well- except for worsening fatigue [C discussion below]; improvement of the M protein noted/kappa lambda light chain noted.   # Proceed with cycle # 5 daratumumab monthly.  Labs acceptable. Anemia improving with hemoglobin 12.  # Confusion/Delirium- sec to ongoing dementia; unlikley sec to steroids/benadryl pre-meds. STABLE.  # Elevated blood pressure- recommend checking at home; bring a log.   #Continue  pre-medications/shingles prophylaxis/ steroids/Claritin/Singulair/tylenol/zantac.   # Right hip pain-Likely arthritic pain. Continue fentanyl patch 25 and also using oxycodone 5 mg as needed twice a day. STABLE. New scripts given.   # Halitosis- no obvious signs of infection; recommend dentist evaluation. Recommend dental evaluation.   #  5 weeks/labs [vacation]/infusion/MD/ Myeloma labs.   Cc;Dr. Kym Groom MD

## 2017-05-08 LAB — MULTIPLE MYELOMA PANEL, SERUM
ALBUMIN/GLOB SERPL: 1.2 (ref 0.7–1.7)
Albumin SerPl Elph-Mcnc: 3.2 g/dL (ref 2.9–4.4)
Alpha 1: 0.3 g/dL (ref 0.0–0.4)
Alpha2 Glob SerPl Elph-Mcnc: 0.7 g/dL (ref 0.4–1.0)
B-Globulin SerPl Elph-Mcnc: 0.9 g/dL (ref 0.7–1.3)
Gamma Glob SerPl Elph-Mcnc: 1.1 g/dL (ref 0.4–1.8)
Globulin, Total: 2.9 g/dL (ref 2.2–3.9)
IGA: 15 mg/dL — AB (ref 64–422)
IGM (IMMUNOGLOBULIN M), SRM: 33 mg/dL (ref 26–217)
IgG (Immunoglobin G), Serum: 1097 mg/dL (ref 700–1600)
M Protein SerPl Elph-Mcnc: 0.9 g/dL — ABNORMAL HIGH
TOTAL PROTEIN ELP: 6.1 g/dL (ref 6.0–8.5)

## 2017-05-09 LAB — KAPPA/LAMBDA LIGHT CHAINS
KAPPA FREE LGHT CHN: 17.5 mg/L (ref 3.3–19.4)
Kappa, lambda light chain ratio: 3.3 — ABNORMAL HIGH (ref 0.26–1.65)
Lambda free light chains: 5.3 mg/L — ABNORMAL LOW (ref 5.7–26.3)

## 2017-05-26 DIAGNOSIS — B351 Tinea unguium: Secondary | ICD-10-CM | POA: Diagnosis not present

## 2017-05-26 DIAGNOSIS — M79672 Pain in left foot: Secondary | ICD-10-CM | POA: Diagnosis not present

## 2017-06-11 ENCOUNTER — Inpatient Hospital Stay: Payer: Medicare Other

## 2017-06-11 ENCOUNTER — Inpatient Hospital Stay: Payer: Medicare Other | Admitting: Internal Medicine

## 2017-06-19 ENCOUNTER — Other Ambulatory Visit: Payer: Self-pay

## 2017-06-19 ENCOUNTER — Inpatient Hospital Stay: Payer: Medicare Other | Attending: Internal Medicine

## 2017-06-19 ENCOUNTER — Inpatient Hospital Stay (HOSPITAL_BASED_OUTPATIENT_CLINIC_OR_DEPARTMENT_OTHER): Payer: Medicare Other | Admitting: Internal Medicine

## 2017-06-19 ENCOUNTER — Inpatient Hospital Stay: Payer: Medicare Other

## 2017-06-19 ENCOUNTER — Encounter: Payer: Self-pay | Admitting: Internal Medicine

## 2017-06-19 VITALS — BP 156/82 | HR 84 | Temp 96.9°F | Resp 18 | Ht 63.0 in | Wt 102.4 lb

## 2017-06-19 DIAGNOSIS — Z8739 Personal history of other diseases of the musculoskeletal system and connective tissue: Secondary | ICD-10-CM | POA: Diagnosis not present

## 2017-06-19 DIAGNOSIS — I10 Essential (primary) hypertension: Secondary | ICD-10-CM | POA: Insufficient documentation

## 2017-06-19 DIAGNOSIS — Z5112 Encounter for antineoplastic immunotherapy: Secondary | ICD-10-CM | POA: Diagnosis present

## 2017-06-19 DIAGNOSIS — G893 Neoplasm related pain (acute) (chronic): Secondary | ICD-10-CM

## 2017-06-19 DIAGNOSIS — C9002 Multiple myeloma in relapse: Secondary | ICD-10-CM | POA: Insufficient documentation

## 2017-06-19 DIAGNOSIS — M25551 Pain in right hip: Secondary | ICD-10-CM | POA: Diagnosis not present

## 2017-06-19 DIAGNOSIS — Z79899 Other long term (current) drug therapy: Secondary | ICD-10-CM | POA: Diagnosis not present

## 2017-06-19 DIAGNOSIS — C9000 Multiple myeloma not having achieved remission: Secondary | ICD-10-CM

## 2017-06-19 DIAGNOSIS — I2510 Atherosclerotic heart disease of native coronary artery without angina pectoris: Secondary | ICD-10-CM

## 2017-06-19 DIAGNOSIS — I4891 Unspecified atrial fibrillation: Secondary | ICD-10-CM | POA: Diagnosis not present

## 2017-06-19 DIAGNOSIS — R451 Restlessness and agitation: Secondary | ICD-10-CM

## 2017-06-19 DIAGNOSIS — M1990 Unspecified osteoarthritis, unspecified site: Secondary | ICD-10-CM | POA: Insufficient documentation

## 2017-06-19 DIAGNOSIS — F0390 Unspecified dementia without behavioral disturbance: Secondary | ICD-10-CM | POA: Insufficient documentation

## 2017-06-19 DIAGNOSIS — E785 Hyperlipidemia, unspecified: Secondary | ICD-10-CM | POA: Insufficient documentation

## 2017-06-19 DIAGNOSIS — Z923 Personal history of irradiation: Secondary | ICD-10-CM

## 2017-06-19 DIAGNOSIS — R5383 Other fatigue: Secondary | ICD-10-CM | POA: Insufficient documentation

## 2017-06-19 LAB — COMPREHENSIVE METABOLIC PANEL
ALK PHOS: 86 U/L (ref 38–126)
ALT: 28 U/L (ref 14–54)
ANION GAP: 8 (ref 5–15)
AST: 32 U/L (ref 15–41)
Albumin: 3.5 g/dL (ref 3.5–5.0)
BILIRUBIN TOTAL: 0.6 mg/dL (ref 0.3–1.2)
BUN: 21 mg/dL — ABNORMAL HIGH (ref 6–20)
CALCIUM: 8.8 mg/dL — AB (ref 8.9–10.3)
CO2: 25 mmol/L (ref 22–32)
CREATININE: 0.72 mg/dL (ref 0.44–1.00)
Chloride: 105 mmol/L (ref 101–111)
GFR calc non Af Amer: >60 mL/min (ref >60)
Glucose, Bld: 126 mg/dL — ABNORMAL HIGH (ref 65–99)
Potassium: 4 mmol/L (ref 3.5–5.1)
Sodium: 138 mmol/L (ref 135–145)
TOTAL PROTEIN: 6.9 g/dL (ref 6.5–8.1)

## 2017-06-19 LAB — CBC WITH DIFFERENTIAL/PLATELET
BASOS ABS: 0 10*3/uL (ref 0–0.1)
BASOS PCT: 1 %
EOS ABS: 0 10*3/uL (ref 0–0.7)
Eosinophils Relative: 1 %
HEMATOCRIT: 40 % (ref 35.0–47.0)
Hemoglobin: 13.2 g/dL (ref 12.0–16.0)
Lymphocytes Relative: 31 %
Lymphs Abs: 2 10*3/uL (ref 1.0–3.6)
MCH: 29.7 pg (ref 26.0–34.0)
MCHC: 33 g/dL (ref 32.0–36.0)
MCV: 90 fL (ref 80.0–100.0)
Monocytes Absolute: 0.6 10*3/uL (ref 0.2–0.9)
Monocytes Relative: 9 %
NEUTROS ABS: 3.6 10*3/uL (ref 1.4–6.5)
NEUTROS PCT: 58 %
Platelets: 236 10*3/uL (ref 150–440)
RBC: 4.45 MIL/uL (ref 3.80–5.20)
RDW: 15.4 % — AB (ref 11.5–14.5)
WBC: 6.2 10*3/uL (ref 3.6–11.0)

## 2017-06-19 MED ORDER — HEPARIN SOD (PORK) LOCK FLUSH 100 UNIT/ML IV SOLN
500.0000 [IU] | Freq: Once | INTRAVENOUS | Status: AC | PRN
  Administered 2017-06-19: 500 [IU]
  Filled 2017-06-19: qty 5

## 2017-06-19 MED ORDER — SODIUM CHLORIDE 0.9% FLUSH
10.0000 mL | INTRAVENOUS | Status: DC | PRN
  Administered 2017-06-19: 10 mL
  Filled 2017-06-19: qty 10

## 2017-06-19 MED ORDER — PROCHLORPERAZINE MALEATE 10 MG PO TABS
10.0000 mg | ORAL_TABLET | Freq: Once | ORAL | Status: AC
  Administered 2017-06-19: 10 mg via ORAL
  Filled 2017-06-19: qty 1

## 2017-06-19 MED ORDER — SODIUM CHLORIDE 0.9 % IV SOLN
Freq: Once | INTRAVENOUS | Status: AC
  Administered 2017-06-19: 10:00:00 via INTRAVENOUS
  Filled 2017-06-19: qty 1000

## 2017-06-19 MED ORDER — RISPERIDONE 1 MG PO TABS: 1.0000 mg | ORAL_TABLET | Freq: Every day | ORAL | 3 refills | Status: AC

## 2017-06-19 MED ORDER — OXYCODONE HCL 5 MG PO TABS: 2.5000 mg | ORAL_TABLET | Freq: Three times a day (TID) | ORAL | 0 refills | Status: DC | PRN

## 2017-06-19 MED ORDER — SODIUM CHLORIDE 0.9 % IV SOLN
800.0000 mg | Freq: Once | INTRAVENOUS | Status: AC
  Administered 2017-06-19: 800 mg via INTRAVENOUS
  Filled 2017-06-19: qty 40

## 2017-06-19 MED ORDER — ACETAMINOPHEN 325 MG PO TABS
650.0000 mg | ORAL_TABLET | Freq: Once | ORAL | Status: AC
  Administered 2017-06-19: 650 mg via ORAL
  Filled 2017-06-19: qty 2

## 2017-06-19 MED ORDER — SODIUM CHLORIDE 0.9 % IV SOLN
20.0000 mg | Freq: Once | INTRAVENOUS | Status: AC
  Administered 2017-06-19: 20 mg via INTRAVENOUS
  Filled 2017-06-19: qty 2

## 2017-06-19 MED ORDER — FENTANYL 25 MCG/HR TD PT72: 25.0000 ug | MEDICATED_PATCH | TRANSDERMAL | 0 refills | Status: DC

## 2017-06-19 MED ORDER — DIPHENHYDRAMINE HCL 25 MG PO CAPS
25.0000 mg | ORAL_CAPSULE | Freq: Once | ORAL | Status: AC
  Administered 2017-06-19: 25 mg via ORAL
  Filled 2017-06-19: qty 1

## 2017-06-19 NOTE — Progress Notes (Signed)
Patient's caregiver reports that patient appears very anxious at bedtime.

## 2017-06-19 NOTE — Progress Notes (Signed)
[name] OFFICE PROGRESS NOTE  Patient Care Team: Derinda Late, MD as PCP - General (Family Medicine) Forest Gleason, MD (Oncology) Christene Lye, MD (General Surgery)  No matching staging information was found for the patient.   Oncology History   1. Stage III Multiple Myeloma a. 11/20/09: SPEP showed quantitative IgG 4567 mg/dL with M spike of 3.8 g/dL and IFE showing IgG monoclonal protein with light chain specificity.  [place] urine protein electrophoresis showed M spike 27% Bence Jones Protein.  a. 11/28/09: bone marrow biopsy: - PLASMA CELL DYSCRASIA CONSISTENT WITH MULTIPLE MYELOMA (55%PLASMA CELLS ON ASPIRATE, 75% BY CD138 IMMUNOHISTOCHEMISTRY,KAPPA RESTRICTED).TRILINEAGE HEMATOPOIESIS.46 XX,  Normal FISH panel. b. 11/29/09: bone survey: There are numerous, scattered 2 mm to 5 mm concentric  radiolucencies in the bony calvaria.   Two or three similar focal lucent areas in the right  femur.  There are observed changes consistent with disc disease at multiple levels of the cervical spine and multiple levels of the lumbar spine. c. 12/07/09: right humerus: Multiple tiny lucencies noted throughout the right humerus including humeral diaphysis. No fracture. d. 8/11- 4/12 Mephalan/prednisone/velcade X 6 cycles with decrease in Mspike to 1.0 gm/dl. e.osteonecrosis of jaw and diagnosed in December of 2013(Zometa was discontinued) 7.Patient was started on Velcade and Decadron   because of progressive multiple myeloma from January of 2013. 8. Started on Kyprolis 05/13 9.progressing disease on kyphorilis September of 2013 10 patient has been started on [name] daily for 21 days and one week off Decadron day1, 18 and 15   with 4 week cycle is finished palliative radiation therapy without much relief in pain(November, 2013) 11.Palliative radiation therapy to the right shoulder (December, 2014) 12 patient was on break from pomolidomide because of infection and it has  been presumed from December 05, 2014  # July 2017- skeletal survey- stable bone lesions.   # July 10th PET- No bone involvement  # July 16th 2018- Dara; stop Pom sec cytopenia.   ------------------------------------------------------  # History of osteonecrosis of the jaw- not on Zometa.     Multiple myeloma not having achieved remission ([place])     INTERVAL HISTORY: Patient is a fair historian at best.  [name] 84 y.o.  female pleasant patient With mild-moderate dementia and above history of Multiple myeloma currently daratumumab Status post cycle # 5  Is here for follow-up.  As per the family patient has been more tired at home.  She has been taking "long naps".  Denies any shortness of breath or cough.  As per the family she has been more agitated at nighttime.  She denies any worsening pain in the right hip./ She continues to wear the fentanyl patch and use oxycodone as needed. No nausea no vomiting. No skin rash. No diarrhea.  Denies any tingling or numbness.  No falls.  REVIEW OF SYSTEMS:  A complete 10 point review of system is done which is negative except mentioned above/history of present illness.   PAST MEDICAL HISTORY :  Past Medical History:  Diagnosis Date  . Anxiety   . Arthritis   . Atrial fibrillation ([place])   . Coronary arteriosclerosis   . History of blood transfusion 2014  . Hyperlipidemia   . Hypertension   . Multiple myeloma ([place])   . Multiple myeloma in relapse ([place]) 10/29/2014  . Osteonecrosis of jaw ([place])    left    PAST SURGICAL HISTORY :   Past Surgical History:  Procedure Laterality Date  . ABDOMINAL  HYSTERECTOMY    . APPENDECTOMY    . PARTIAL COLECTOMY    . TOTAL KNEE REVISION Left     FAMILY HISTORY :   Family History  Problem Relation Age of Onset  . Cancer Father   . Heart attack Mother     SOCIAL HISTORY:   Social History   Tobacco Use  . Smoking status: Never Smoker  . Smokeless tobacco: Never Used  Substance Use  Topics  . Alcohol use: Yes    Alcohol/week: 1.2 oz    Types: 2 Glasses of wine per week  . Drug use: No    ALLERGIES:  is allergic to biaxin [clarithromycin]; demerol [meperidine]; hydrocodone; sulfa antibiotics; tramadol; and ultracet [tramadol-acetaminophen].  MEDICATIONS:  Current Outpatient Medications  Medication Sig Dispense Refill  . acetaminophen (TYLENOL 8 HOUR) 650 MG CR tablet Take 650 mg by mouth. 1 tablet twice a day x 4 days. Start taking 2 days prior to [facility] treatment, on the day of treatment and day after treatment.    Marland Kitchen acyclovir (ZOVIRAX) 400 MG tablet TAKE 1 TABLET BY MOUTH ONCE DAILY TO  PREVENT  SHINGLES 30 tablet 3  . Biotin 1 MG CAPS Take 1 capsule by mouth daily.     . Calcium-Magnesium-Vitamin D (CALCIUM 500 PO) Take 1 tablet by mouth daily.     Marland Kitchen dexamethasone (DECADRON) 4 MG tablet Take 1 tablet (4 mg total) by mouth 2 (two) times daily. Start 2 days prior to infusion; Take it for 4 days. (Patient taking differently: Take 4 mg by mouth 2 (two) times daily. Start 1 days prior to infusion) 60 tablet 3  . Docusate Sodium (COLACE PO) Take 2 tablets by mouth 2 (two) times daily.    . fentaNYL (DURAGESIC - DOSED MCG/HR) 25 MCG/HR patch Place 1 patch (25 mcg total) onto the skin every 3 (three) days. 10 patch 0  . ferrous sulfate 325 (65 FE) MG EC tablet Take 325 mg by mouth daily.    Marland Kitchen lidocaine-prilocaine (EMLA) cream Apply 1 application topically as needed. Apply small amount to port site at least 1 hour prior to it being accessed, cover with plastic wrap 30 g 1  . loratadine (CLARITIN) 10 MG tablet Take 10 mg by mouth. 1 tablet a day x 4 days. Start taking 2 days prior to [facility] treatment, on the day of treatment and day after treatment.    . mirtazapine (REMERON) 30 MG tablet Take 1 tablet (30 mg total) by mouth at bedtime. 60 tablet 2  . montelukast (SINGULAIR) 10 MG tablet Take 1 tablet (10 mg total) by mouth at bedtime. Start 2 days prior to infusion. Take it  for 4 days. 60 tablet 3  . Multiple Vitamins-Minerals (CENTRUM SILVER 50+WOMEN PO) Take 1 tablet by mouth daily.     Marland Kitchen oxyCODONE (OXY IR/ROXICODONE) 5 MG immediate release tablet Take 0.5 tablets (2.5 mg total) by mouth every 8 (eight) hours as needed for severe pain. 30 tablet 0  . polyethylene glycol (MIRALAX / GLYCOLAX) packet Take 17 g by mouth daily as needed for mild constipation.    . ranitidine (ZANTAC) 150 MG capsule Take 150 mg by mouth 2 (two) times daily. 1 tablet twice a day x 4 days. Start taking 2 days prior to [facility] treatment, on the day of treatment and day after treatment.    . risperiDONE (RISPERDAL) 1 MG tablet Take 1 tablet (1 mg total) by mouth at bedtime. 90 tablet 3  . ondansetron (ZOFRAN) 4  MG tablet Take 1 tablet (4 mg total) by mouth every 8 (eight) hours as needed for nausea or vomiting. (Patient not taking: Reported on 06/19/2017) 20 tablet 0  . prochlorperazine (COMPAZINE) 10 MG tablet Take 1 tablet (10 mg total) by mouth every 8 (eight) hours as needed for nausea or vomiting. (Patient not taking: Reported on 06/19/2017) 30 tablet 1   No current facility-administered medications for this visit.    Facility-Administered Medications Ordered in Other Visits  Medication Dose Route Frequency Provider Last Rate Last Dose  . sodium chloride 0.9 % injection 10 mL  10 mL Intravenous PRN Forest Gleason, MD   10 mL at 03/01/15 1305    PHYSICAL EXAMINATION: ECOG PERFORMANCE STATUS: 0 - Asymptomatic  BP (!) 156/82   Pulse 84   Temp (!) 96.9 F (36.1 C) (Tympanic)   Resp 18   Ht _0  (1.6 m)   Wt 102 lb 6.4 oz (46.4 kg)   BMI 18.14 kg/m   Filed Weights   06/19/17 0904  Weight: 102 lb 6.4 oz (46.4 kg)    GENERAL: frail-appearing elderly female patiett Alert, no distress and comfortable. Accompanied by her care giver/ daughter-in-law. Patient is walking. EYES: no pallor or icterus OROPHARYNX: no thrush or ulceration; good dentition.  NECK: supple, no masses  felt LYMPH:  no palpable lymphadenopathy in the cervical, axillary or inguinal regions LUNGS: clear to auscultation and  No wheeze or crackles HEART/CVS: regular rate & rhythm and no murmurs; no swelling of the legs. No tenderness.  ABDOMEN:abdomen soft, non-tender and normal bowel sounds Musculoskeletal:no cyanosis of digits and no clubbing  PSYCH: alert & oriented x 3 with fluent speech NEURO: no focal motor/sensory deficits SKIN:  no rashes or significant lesions  LABORATORY DATA:  I have reviewed the data as listed    Component Value Date/Time   NA 138 06/19/2017 0759   NA 138 09/19/2014 1118   K 4.0 06/19/2017 0759   K 4.0 09/19/2014 1118   CL 105 06/19/2017 0759   CL 103 09/19/2014 1118   CO2 25 06/19/2017 0759   CO2 29 09/19/2014 1118   GLUCOSE 126 (H) 06/19/2017 0759   GLUCOSE 91 09/19/2014 1118   BUN 21 (H) 06/19/2017 0759   BUN 15 09/19/2014 1118   CREATININE 0.72 06/19/2017 0759   CREATININE 0.69 09/19/2014 1118   CALCIUM 8.8 (L) 06/19/2017 0759   CALCIUM 9.0 09/19/2014 1118   PROT 6.9 06/19/2017 0759   PROT 6.8 09/19/2014 1118   ALBUMIN 3.5 06/19/2017 0759   ALBUMIN 3.9 09/19/2014 1118   AST 32 06/19/2017 0759   AST 20 09/19/2014 1118   ALT 28 06/19/2017 0759   ALT 16 09/19/2014 1118   ALKPHOS 86 06/19/2017 0759   ALKPHOS 85 09/19/2014 1118   BILITOT 0.6 06/19/2017 0759   BILITOT 0.6 09/19/2014 1118   GFRNONAA >60 06/19/2017 0759   GFRNONAA >60 09/19/2014 1118   GFRAA >60 06/19/2017 0759   GFRAA >60 09/19/2014 1118    No results found for: SPEP, UPEP  Lab Results  Component Value Date   WBC 6.2 06/19/2017   NEUTROABS 3.6 06/19/2017   HGB 13.2 06/19/2017   HCT 40.0 06/19/2017   MCV 90.0 06/19/2017   PLT 236 06/19/2017      Chemistry      Component Value Date/Time   NA 138 06/19/2017 0759   NA 138 09/19/2014 1118   K 4.0 06/19/2017 0759   K 4.0 09/19/2014 1118   CL 105 06/19/2017  0759   CL 103 09/19/2014 1118   CO2 25 06/19/2017 0759    CO2 29 09/19/2014 1118   BUN 21 (H) 06/19/2017 0759   BUN 15 09/19/2014 1118   CREATININE 0.72 06/19/2017 0759   CREATININE 0.69 09/19/2014 1118      Component Value Date/Time   CALCIUM 8.8 (L) 06/19/2017 0759   CALCIUM 9.0 09/19/2014 1118   ALKPHOS 86 06/19/2017 0759   ALKPHOS 85 09/19/2014 1118   AST 32 06/19/2017 0759   AST 20 09/19/2014 1118   ALT 28 06/19/2017 0759   ALT 16 09/19/2014 1118   BILITOT 0.6 06/19/2017 0759   BILITOT 0.6 09/19/2014 1118      IMPRESSION: No FDG avid myelomatous lesions are evident.  Mildly hypermetabolic mediastinal and bilateral perihilar lymph nodes, likely reactive.   Electronically Signed   By: Julian Hy M.D.   On: 12/13/2016 11:41  Results for KRISTL, MORIOKA (MRN [id number]) as of 06/19/2017 09:04  Ref. Range 08/30/2004 11:38 09/07/2004 12:40 09/28/2004 13:26 10/29/2004 11:21 02/14/2006 12:55 05/20/2006 16:17 05/20/2006 16:59 09/03/2006 12:23 03/05/2007 10:55 03/18/2007 14:23 05/18/2007 10:42 06/02/2007 09:33 07/16/2007 14:13 11/11/2007 15:00 12/21/2007 11:09 11/20/2009 11:07 11/29/2009 09:50 12/05/2009 15:32 12/07/2009 17:30 12/07/2009 17:31 12/26/2009 13:51 06/07/2010 07:45 09/19/2010 13:27 09/19/2010 13:31 12/26/2010 15:50 01/31/2011 11:59 06/19/2011 14:28 07/03/2011 10:10 07/11/2011 14:09 07/17/2011 14:10 07/24/2011 14:09 07/31/2011 14:02 08/14/2011 14:06 08/28/2011 13:37 09/11/2011 14:46 09/18/2011 14:22 09/25/2011 14:38 10/02/2011 14:26 10/09/2011 09:38 10/09/2011 10:30 10/09/2011 11:01 10/09/2011 13:26 10/16/2011 13:29 10/23/2011 09:04 10/30/2011 10:39 11/13/2011 08:33 11/20/2011 08:11 11/27/2011 08:38 12/11/2011 10:02 12/25/2011 08:55 01/01/2012 09:02 01/23/2012 11:08 01/30/2012 11:37 02/04/2012 13:07 02/20/2012 14:21 03/19/2012 12:36 03/20/2012 13:26 04/01/2012 20:07 04/01/2012 20:14 04/01/2012 23:29 04/01/2012 23:50 04/02/2012 00:15 04/02/2012 10:09 04/10/2012 13:33 04/13/2012 13:30 04/28/2012 13:46 04/28/2012 13:47 05/18/2012 12:02 06/16/2012 10:38 07/14/2012 10:48 08/11/2012 10:59 09/08/2012  11:13 10/01/2012 13:01 10/06/2012 11:04 11/03/2012 11:10 12/15/2012 11:34 01/05/2013 11:20 02/02/2013 10:58 02/23/2013 10:32 02/23/2013 12:20 02/23/2013 12:26 04/06/2013 11:17 04/14/2013 10:29 05/04/2013 10:30 05/12/2013 11:47 06/15/2013 11:45 07/29/2013 10:38 08/26/2013 10:39 10/14/2013 11:03 10/18/2013 12:59 10/18/2013 13:10 12/09/2013 11:20 01/20/2014 11:27 02/21/2014 10:28 03/03/2014 11:29 04/06/2014 11:36 04/14/2014 11:22 05/19/2014 11:46 06/08/2014 15:25 06/19/2014 21:38 06/23/2014 10:28 06/23/2014 10:29 07/21/2014 12:24 08/04/2014 10:39 08/11/2014 10:47 09/19/2014 11:18 10/24/2014 11:58 12/05/2014 11:38 12/05/2014 11:39 12/27/2014 11:50 01/02/2015 11:51 01/02/2015 11:52 01/16/2015 00:00 02/02/2015 11:08 02/15/2015 15:34 03/01/2015 11:03 03/01/2015 11:03 03/08/2015 17:36 03/29/2015 10:47 04/27/2015 10:31 04/27/2015 10:31 05/25/2015 11:05 05/25/2015 11:06 06/14/2015 16:37 06/27/2015 11:13 06/27/2015 11:14 07/25/2015 10:52 08/22/2015 15:28 09/28/2015 14:24 11/01/2015 10:59 11/29/2015 10:53 12/28/2015 10:00 12/28/2015 12:30 01/25/2016 11:17 02/23/2016 10:28 03/22/2016 10:55 04/04/2016 14:32 04/08/2016 16:20 04/22/2016 13:55 05/23/2016 10:30 06/12/2016 15:18 06/12/2016 19:01 06/12/2016 20:07 07/05/2016 12:07 08/07/2016 09:38 09/05/2016 10:40 10/01/2016 11:14 10/29/2016 10:53 10/29/2016 10:58 12/03/2016 10:54 12/13/2016 08:18 12/13/2016 11:11 12/23/2016 08:11 12/23/2016 09:20 01/01/2017 08:01 01/08/2017 08:05 01/15/2017 08:07 01/22/2017 08:18 01/29/2017 08:19 02/05/2017 08:09 02/05/2017 08:24 02/12/2017 09:12 02/19/2017 08:40 03/05/2017 09:16 04/02/2017 08:32 04/02/2017 08:46 05/07/2017 08:35 06/19/2017 07:59  Kappa free light chain Latest Ref Range: 3.3 - 19.4 mg/L  14.97 18.31   16.04   16.25  14.23    16.63  16.95   15.77  13.78 16.56 15.65 18.81 19.8 (H) 15.7  12.8 20.5 (H) 27.0 (H)   19.2 31.1 (H)    82.4 (H) 73.1 (H) 81.6 (H) 122.0 (H) 113.3 (H)  110.6 (H)           21.6 (H)     22.6 (H) 17.5    Lamda free light chains Latest Ref Range: 5.7 - 26.3 mg/L                                                                                                           11.56 11.72   11.21   9.68  9.90    10.80  13.59   12.81  9.95 10.92 9.14 9.23 8.2 8.3  9.6 11.0 17.5   10.1 9.7    12.2 12.3 12.1 11.7 12.9  11.6           5.6 (L)     5.5 (L) 5.3 (L)   Kappa, lamda light chain ratio Latest Ref Range: 0.26 - 1.65                                                                                                            1.29 1.56   1.43   1.68 (H)  1.44    1.54  1.25   1.23  1.38 1.52 1.71 (H) 2.04 (H) 2.41 (H) 1.89 (H)  1.33 1.86 (H) 1.54   1.90 (H) 3.21 (H)    6.75 (H) 5.94 (H) 6.74 (H) 10.43 (H) 8.78 (H)  9.53 (H)           3.86 (H)     4.11 (H) 3.30 (H)   M Protein SerPl Elph-Mcnc Latest Ref Range: Not Observed g/dL                                                                                                                    0.4 (H)    0.5 (H)  0.5 (H)    0.4 (H) 0.4 (H) 0.4 (  H) 0.4 (H) 0.4 (H) 0.4 (H) 0.5 (H)  0.4 (H) 0.5 (H) 0.5 (H)   0.5 (H) 0.8 (H)    1.1 (H) 1.1 (H) 1.2 (H) 1.3 (H)  1.4 (H) 1.6 (H)          1.0 (H)     1.0 (H)  0.9 (H)       RADIOGRAPHIC STUDIES: I have personally reviewed the radiological images as listed and agreed with the findings in the report. No results found.   ASSESSMENT & PLAN:  Multiple myeloma not having achieved remission [facility]) Multiple myeloma relapsed June  2018 M protein 1.6 g [slightly elevated/rising]; kappa [113] lambda light chain ratio= elevated; up to 8.7.    # Patient currently on [place] well- except for worsening fatigue [C discussion below]; improvement of the M protein noted/kappa lambda light chain noted.   # Proceed with cycle # 6 daratumumab plan today.  Labs acceptable. Anemia improving with hemoglobin 12.  However patient noted to have worsening fatigue-question related to daratumumab versus others.  Recommend chemo holiday after this  treatment  # Confusion/Delirium- sec to ongoing dementia;increase the risperidol 3m/day.  # Elevated blood pressure- recommend checking at home; bring a log at next visit.  #Continue  pre-medications/shingles prophylaxis/steroids/Claritin/Singulair/ tylenol/zantac.  She will discontinue all the pre-meds after this visit except for acyclovir.  # Right hip pain-Likely arthritic pain. Continue fentanyl patch 25 and also using oxycodone 5 mg as needed twice a day. STABLE. New scripts given.   # Fatigue- ? Sec to dara- recommend "holiday" after this treatment.   # follow up in 2 months/labs/no treatment planned.   Cc;Dr. OKym GroomMD  Orders Placed This Encounter  Procedures  . CBC with Differential/Platelet    Standing Status:   Future    Standing Expiration Date:   06/19/2018  . Comprehensive metabolic panel    Standing Status:   Future    Standing Expiration Date:   06/19/2018  . Kappa/lambda light chains    Standing Status:   Future    Standing Expiration Date:   06/19/2018  . Multiple Myeloma Panel (SPEP&IFE w/QIG)    Standing Status:   Future    Standing Expiration Date:   06/19/2018   All questions were answered. The patient knows to call the clinic with any problems, questions or concerns.      GCammie Sickle MD 07/01/2017 2:08 PM

## 2017-06-19 NOTE — Assessment & Plan Note (Addendum)
Multiple myeloma relapsed June  2018 M protein 1.6 g [slightly elevated/rising]; kappa [113] lambda light chain ratio= elevated; up to 8.7.    # Patient currently on [place] well- except for worsening fatigue [C discussion below]; improvement of the M protein noted/kappa lambda light chain noted.   # Proceed with cycle # 6 daratumumab plan today.  Labs acceptable. Anemia improving with hemoglobin 12.  However patient noted to have worsening fatigue-question related to daratumumab versus others.  Recommend chemo holiday after this treatment  # Confusion/Delirium- sec to ongoing dementia;increase the risperidol 61m/day.  # Elevated blood pressure- recommend checking at home; bring a log at next visit.  #Continue  pre-medications/shingles prophylaxis/steroids/Claritin/Singulair/ tylenol/zantac.  She will discontinue all the pre-meds after this visit except for acyclovir.  # Right hip pain-Likely arthritic pain. Continue fentanyl patch 25 and also using oxycodone 5 mg as needed twice a day. STABLE. New scripts given.   # Fatigue- ? Sec to dara- recommend "holiday" after this treatment.   # follow up in 2 months/labs/no treatment planned.   Cc;Dr. OKym GroomMD

## 2017-06-20 LAB — KAPPA/LAMBDA LIGHT CHAINS
KAPPA FREE LGHT CHN: 17.4 mg/L (ref 3.3–19.4)
KAPPA, LAMDA LIGHT CHAIN RATIO: 3.11 — AB (ref 0.26–1.65)
LAMDA FREE LIGHT CHAINS: 5.6 mg/L — AB (ref 5.7–26.3)

## 2017-06-23 ENCOUNTER — Telehealth: Payer: Self-pay | Admitting: *Deleted

## 2017-06-23 LAB — MULTIPLE MYELOMA PANEL, SERUM
Albumin SerPl Elph-Mcnc: 3.3 g/dL (ref 2.9–4.4)
Albumin/Glob SerPl: 1.1 (ref 0.7–1.7)
Alpha 1: 0.3 g/dL (ref 0.0–0.4)
Alpha2 Glob SerPl Elph-Mcnc: 0.8 g/dL (ref 0.4–1.0)
B-GLOBULIN SERPL ELPH-MCNC: 1 g/dL (ref 0.7–1.3)
GAMMA GLOB SERPL ELPH-MCNC: 1.1 g/dL (ref 0.4–1.8)
Globulin, Total: 3.1 g/dL (ref 2.2–3.9)
IgA: 15 mg/dL — ABNORMAL LOW (ref 64–422)
IgG (Immunoglobin G), Serum: 1151 mg/dL (ref 700–1600)
IgM (Immunoglobulin M), Srm: 37 mg/dL (ref 26–217)
M PROTEIN SERPL ELPH-MCNC: 0.8 g/dL — AB
Total Protein ELP: 6.4 g/dL (ref 6.0–8.5)

## 2017-06-23 NOTE — Telephone Encounter (Signed)
[name] called asking how long [name] will be off the oral chemotherapy, will she be restarted on it? and if you think it is time to call Hospice in. Please advise

## 2017-06-23 NOTE — Telephone Encounter (Signed)
Patient is actually already followed by Palliative Care and they will reach out to [place], they recently spoke with Mrs Edison who told them she is fine and has no needs.

## 2017-06-23 NOTE — Telephone Encounter (Signed)
[name] -patient is not on any oral chemotherapy; I just gave her chemo holiday.  Given other comorbidities-palliative care referral would be reasonable.  Please make a palliative care if family is interested.

## 2017-07-22 ENCOUNTER — Other Ambulatory Visit: Payer: Self-pay | Admitting: *Deleted

## 2017-07-22 DIAGNOSIS — G893 Neoplasm related pain (acute) (chronic): Secondary | ICD-10-CM

## 2017-07-22 DIAGNOSIS — C9000 Multiple myeloma not having achieved remission: Secondary | ICD-10-CM

## 2017-07-22 MED ORDER — OXYCODONE HCL 5 MG PO TABS: 2.5000 mg | ORAL_TABLET | Freq: Three times a day (TID) | ORAL | 0 refills | Status: DC | PRN

## 2017-07-22 MED ORDER — FENTANYL 25 MCG/HR TD PT72: 25.0000 ug | MEDICATED_PATCH | TRANSDERMAL | 0 refills | Status: DC

## 2017-07-22 NOTE — Telephone Encounter (Signed)
Requesting refill on pain medications and wants to pick up in [place]. Please call [name] when ready to pick up [phone]

## 2017-07-31 ENCOUNTER — Other Ambulatory Visit: Payer: Self-pay | Admitting: Internal Medicine

## 2017-07-31 DIAGNOSIS — C9000 Multiple myeloma not having achieved remission: Secondary | ICD-10-CM

## 2017-08-04 ENCOUNTER — Other Ambulatory Visit: Payer: Self-pay

## 2017-08-04 ENCOUNTER — Observation Stay (HOSPITAL_BASED_OUTPATIENT_CLINIC_OR_DEPARTMENT_OTHER)
Admission: EM | Admit: 2017-08-04 | Discharge: 2017-08-06 | Disposition: A | Discharge status: Home Health | Payer: Medicare Other | Source: Home / Self Care | Attending: Emergency Medicine | Admitting: Emergency Medicine

## 2017-08-04 ENCOUNTER — Emergency Department: Payer: Medicare Other

## 2017-08-04 ENCOUNTER — Encounter: Payer: Self-pay | Admitting: Emergency Medicine

## 2017-08-04 DIAGNOSIS — G893 Neoplasm related pain (acute) (chronic): Secondary | ICD-10-CM

## 2017-08-04 DIAGNOSIS — Z8673 Personal history of transient ischemic attack (TIA), and cerebral infarction without residual deficits: Secondary | ICD-10-CM | POA: Insufficient documentation

## 2017-08-04 DIAGNOSIS — E785 Hyperlipidemia, unspecified: Secondary | ICD-10-CM | POA: Insufficient documentation

## 2017-08-04 DIAGNOSIS — Z881 Allergy status to other antibiotic agents status: Secondary | ICD-10-CM

## 2017-08-04 DIAGNOSIS — Z66 Do not resuscitate: Secondary | ICD-10-CM | POA: Insufficient documentation

## 2017-08-04 DIAGNOSIS — A419 Sepsis, unspecified organism: Secondary | ICD-10-CM | POA: Diagnosis not present

## 2017-08-04 DIAGNOSIS — I480 Paroxysmal atrial fibrillation: Secondary | ICD-10-CM | POA: Insufficient documentation

## 2017-08-04 DIAGNOSIS — I2510 Atherosclerotic heart disease of native coronary artery without angina pectoris: Secondary | ICD-10-CM | POA: Diagnosis present

## 2017-08-04 DIAGNOSIS — Z882 Allergy status to sulfonamides status: Secondary | ICD-10-CM

## 2017-08-04 DIAGNOSIS — F0390 Unspecified dementia without behavioral disturbance: Secondary | ICD-10-CM | POA: Insufficient documentation

## 2017-08-04 DIAGNOSIS — Z885 Allergy status to narcotic agent status: Secondary | ICD-10-CM | POA: Insufficient documentation

## 2017-08-04 DIAGNOSIS — M6281 Muscle weakness (generalized): Secondary | ICD-10-CM

## 2017-08-04 DIAGNOSIS — I6523 Occlusion and stenosis of bilateral carotid arteries: Secondary | ICD-10-CM

## 2017-08-04 DIAGNOSIS — M064 Inflammatory polyarthropathy: Secondary | ICD-10-CM | POA: Insufficient documentation

## 2017-08-04 DIAGNOSIS — R627 Adult failure to thrive: Secondary | ICD-10-CM | POA: Insufficient documentation

## 2017-08-04 DIAGNOSIS — Z681 Body mass index (BMI) 19 or less, adult: Secondary | ICD-10-CM

## 2017-08-04 DIAGNOSIS — R2689 Other abnormalities of gait and mobility: Secondary | ICD-10-CM

## 2017-08-04 DIAGNOSIS — C9000 Multiple myeloma not having achieved remission: Secondary | ICD-10-CM | POA: Diagnosis present

## 2017-08-04 DIAGNOSIS — E860 Dehydration: Secondary | ICD-10-CM

## 2017-08-04 DIAGNOSIS — M1990 Unspecified osteoarthritis, unspecified site: Secondary | ICD-10-CM | POA: Insufficient documentation

## 2017-08-04 DIAGNOSIS — I10 Essential (primary) hypertension: Secondary | ICD-10-CM | POA: Insufficient documentation

## 2017-08-04 DIAGNOSIS — R531 Weakness: Secondary | ICD-10-CM | POA: Diagnosis not present

## 2017-08-04 DIAGNOSIS — R4182 Altered mental status, unspecified: Secondary | ICD-10-CM

## 2017-08-04 DIAGNOSIS — R29898 Other symptoms and signs involving the musculoskeletal system: Secondary | ICD-10-CM | POA: Diagnosis present

## 2017-08-04 DIAGNOSIS — Z79899 Other long term (current) drug therapy: Secondary | ICD-10-CM

## 2017-08-04 DIAGNOSIS — Z888 Allergy status to other drugs, medicaments and biological substances status: Secondary | ICD-10-CM | POA: Insufficient documentation

## 2017-08-04 LAB — URINALYSIS, COMPLETE (UACMP) WITH MICROSCOPIC
BACTERIA UA: NONE SEEN
Bilirubin Urine: NEGATIVE
Glucose, UA: NEGATIVE mg/dL
Hgb urine dipstick: NEGATIVE
KETONES UR: NEGATIVE mg/dL
LEUKOCYTES UA: NEGATIVE
Nitrite: NEGATIVE
PROTEIN: NEGATIVE mg/dL
Specific Gravity, Urine: 1.012 (ref 1.005–1.030)
pH: 7 (ref 5.0–8.0)

## 2017-08-04 LAB — COMPREHENSIVE METABOLIC PANEL
ALBUMIN: 4 g/dL (ref 3.5–5.0)
ALT: 18 U/L (ref 14–54)
AST: 33 U/L (ref 15–41)
Alkaline Phosphatase: 81 U/L (ref 38–126)
Anion gap: 9 (ref 5–15)
BUN: 17 mg/dL (ref 6–20)
CHLORIDE: 98 mmol/L — AB (ref 101–111)
CO2: 24 mmol/L (ref 22–32)
Calcium: 8.4 mg/dL — ABNORMAL LOW (ref 8.9–10.3)
Creatinine, Ser: 0.71 mg/dL (ref 0.44–1.00)
GFR calc Af Amer: >60 mL/min (ref >60)
GFR calc non Af Amer: >60 mL/min (ref >60)
GLUCOSE: 110 mg/dL — AB (ref 65–99)
POTASSIUM: 3.6 mmol/L (ref 3.5–5.1)
Sodium: 131 mmol/L — ABNORMAL LOW (ref 135–145)
Total Bilirubin: 0.7 mg/dL (ref 0.3–1.2)
Total Protein: 7.5 g/dL (ref 6.5–8.1)

## 2017-08-04 LAB — INFLUENZA PANEL BY PCR (TYPE A & B)
Influenza A By PCR: NEGATIVE
Influenza B By PCR: NEGATIVE

## 2017-08-04 LAB — CBC
HEMATOCRIT: 39 % (ref 35.0–47.0)
Hemoglobin: 13.1 g/dL (ref 12.0–16.0)
MCH: 29.8 pg (ref 26.0–34.0)
MCHC: 33.6 g/dL (ref 32.0–36.0)
MCV: 88.9 fL (ref 80.0–100.0)
Platelets: 222 10*3/uL (ref 150–440)
RBC: 4.38 MIL/uL (ref 3.80–5.20)
RDW: 14.9 % — AB (ref 11.5–14.5)
WBC: 13.4 10*3/uL — ABNORMAL HIGH (ref 3.6–11.0)

## 2017-08-04 LAB — TROPONIN I

## 2017-08-04 MED ORDER — ACETAMINOPHEN 160 MG/5ML PO SOLN
650.0000 mg | ORAL | Status: DC | PRN
Start: 2017-08-04 — End: 2017-08-06
  Filled 2017-08-04: qty 20.3

## 2017-08-04 MED ORDER — SODIUM CHLORIDE 0.9 % IV BOLUS (SEPSIS)
1000.0000 mL | Freq: Once | INTRAVENOUS | Status: AC
Start: 2017-08-04 — End: 2017-08-04
  Administered 2017-08-04: 1000 mL via INTRAVENOUS

## 2017-08-04 MED ORDER — MIRTAZAPINE 15 MG PO TABS
30.0000 mg | ORAL_TABLET | Freq: Every day | ORAL | Status: DC
  Administered 2017-08-05 (×2): 30 mg via ORAL
  Filled 2017-08-04 (×2): qty 2

## 2017-08-04 MED ORDER — FENTANYL 25 MCG/HR TD PT72
25.0000 ug | MEDICATED_PATCH | TRANSDERMAL | Status: DC
Start: 2017-08-05 — End: 2017-08-06
  Administered 2017-08-06: 12:00:00 25 ug via TRANSDERMAL
  Filled 2017-08-04 (×2): qty 1

## 2017-08-04 MED ORDER — ACYCLOVIR 400 MG PO TABS
400.0000 mg | ORAL_TABLET | Freq: Every day | ORAL | Status: DC
  Filled 2017-08-04: qty 1

## 2017-08-04 MED ORDER — RISPERIDONE 1 MG PO TABS
1.0000 mg | ORAL_TABLET | Freq: Every day | ORAL | Status: DC
  Administered 2017-08-05 (×2): 1 mg via ORAL
  Filled 2017-08-04 (×3): qty 1

## 2017-08-04 MED ORDER — ACETAMINOPHEN 650 MG RE SUPP: 650.0000 mg | RECTAL | Status: DC | PRN

## 2017-08-04 MED ORDER — STROKE: EARLY STAGES OF RECOVERY BOOK
Freq: Once | Status: AC
  Administered 2017-08-05

## 2017-08-04 MED ORDER — ACETAMINOPHEN 325 MG PO TABS: 650.0000 mg | ORAL_TABLET | ORAL | Status: DC | PRN

## 2017-08-04 MED ORDER — OXYCODONE HCL 5 MG PO TABS: 2.5000 mg | ORAL_TABLET | Freq: Three times a day (TID) | ORAL | Status: DC | PRN

## 2017-08-04 NOTE — H&P (Signed)
[place] [facility] [place] NAME: [name]    MR#:  [id number]  DATE OF BIRTH:  [birth year]  DATE OF ADMISSION:  08/04/2017  PRIMARY CARE PHYSICIAN: [facility]   REQUESTING/REFERRING PHYSICIAN: Clearnce Hasten, MD  CHIEF COMPLAINT:   Chief Complaint  Patient presents with  . Weakness    HISTORY OF PRESENT ILLNESS:  [name]  is a 84 y.o. female who presents with left leg weakness as well as confusion.  This has been going on for the past 24 hours.  Workup here in the ED is initially largely within normal limits.  Some concern here for possible stroke.  Hospitalist called for admission.  Patient does not contribute much information to her HPI due to her confusion  PAST MEDICAL HISTORY:   Past Medical History:  Diagnosis Date  . Anxiety   . Arthritis   . Atrial fibrillation ([place])   . Coronary arteriosclerosis   . History of blood transfusion 2014  . Hyperlipidemia   . Hypertension   . Multiple myeloma ([place])   . Multiple myeloma in relapse ([place]) 10/29/2014  . 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 ABDOMINAL HYSTERECTOMY    . APPENDECTOMY    . PARTIAL COLECTOMY    . TOTAL KNEE REVISION Left     SOCIAL HISTORY:   Social History   Tobacco Use  . Smoking status: Never Smoker  . Smokeless tobacco: Never Used  Substance Use Topics  . Alcohol use: Yes    Alcohol/week: 1.2 oz    Types: 2 Glasses of wine per week    FAMILY HISTORY:   Family History  Problem Relation Age of Onset  . Cancer Father   . Heart attack Mother     DRUG ALLERGIES:   Allergies  Allergen Reactions  . Biaxin [Clarithromycin] Diarrhea and Nausea And Vomiting  . Demerol [Meperidine] Nausea And Vomiting  . Hydrocodone Nausea And Vomiting  . Sulfa Antibiotics Hives  . Tramadol Diarrhea and Nausea And Vomiting  . Ultracet [Tramadol-Acetaminophen]  Other (See Comments)    MEDICATIONS AT HOME:   Prior to Admission medications   Medication Sig Start Date End Date Taking? Authorizing Provider  acyclovir (ZOVIRAX) 400 MG tablet TAKE 1 TABLET BY MOUTH ONCE DAILY TO  PREVENT  SHINGLES Patient taking differently: Take 400 mg by mouth at bedtime. TAKE 1 TABLET BY MOUTH ONCE DAILY TO  PREVENT  SHINGLES 04/25/17  Yes Cammie Sickle, MD  Docusate Sodium (COLACE PO) Take 2 tablets by mouth at bedtime.    Yes [provider]  fentaNYL (DURAGESIC - DOSED MCG/HR) 25 MCG/HR patch Place 1 patch (25 mcg total) onto the skin every 3 (three) days. 07/22/17  Yes Cammie Sickle, MD  lidocaine-prilocaine (EMLA) cream Apply 1 application topically as needed. Apply small amount to port site at least 1 hour prior to it being accessed, cover with plastic wrap 01/15/17  Yes Charlaine Dalton R, MD  mirtazapine (REMERON) 30 MG tablet TAKE 1 TABLET BY MOUTH AT BEDTIME 08/01/17  Yes Cammie Sickle, MD  ondansetron (ZOFRAN) 4 MG tablet Take 1 tablet (4 mg total) by mouth every 8 (eight) hours as needed for nausea or vomiting. 06/12/16  Yes Alfred Levins, [place], MD  oxyCODONE (OXY IR/ROXICODONE) 5 MG immediate release tablet Take 0.5 tablets (2.5 mg total) by mouth every 8 (eight) hours  as needed for severe pain. 07/22/17  Yes Cammie Sickle, MD  polyethylene glycol (MIRALAX / GLYCOLAX) packet Take 17 g by mouth at bedtime as needed for mild constipation.    Yes [provider]  risperiDONE (RISPERDAL) 1 MG tablet Take 1 tablet (1 mg total) by mouth at bedtime. 06/19/17  Yes Cammie Sickle, MD  dexamethasone (DECADRON) 4 MG tablet Take 1 tablet (4 mg total) by mouth 2 (two) times daily. Start 2 days prior to infusion; Take it for 4 days. Patient not taking: Reported on 08/04/2017 12/10/16   Cammie Sickle, MD  montelukast (SINGULAIR) 10 MG tablet Take 1 tablet (10 mg total) by mouth at bedtime. Start 2 days prior to infusion.  Take it for 4 days. Patient not taking: Reported on 08/04/2017 12/10/16   Cammie Sickle, MD  prochlorperazine (COMPAZINE) 10 MG tablet Take 1 tablet (10 mg total) by mouth every 8 (eight) hours as needed for nausea or vomiting. Patient not taking: Reported on 06/19/2017 04/12/16   Cammie Sickle, MD    REVIEW OF SYSTEMS:  Review of Systems  Unable to perform ROS: Acuity of condition     VITAL SIGNS:   Vitals:   08/04/17 1604 08/04/17 1905 08/04/17 1930  BP: 120/67 (!) 142/73 (!) 142/74  Pulse: 93 93 88  Resp: _0 Temp: 98.2 F (36.8 C)    TempSrc: Oral    SpO2: 96% 96% 94%  Weight: 45.4 kg (100 lb)    Height: _1  (1.549 m)     Wt Readings from Last 3 Encounters:  08/04/17 45.4 kg (100 lb)  06/19/17 46.4 kg (102 lb 6.4 oz)  05/07/17 47.6 kg (105 lb)    PHYSICAL EXAMINATION:  Physical Exam  Vitals reviewed. Constitutional: She appears well-developed and well-nourished. No distress.  HENT:  Head: Normocephalic and atraumatic.  Mouth/Throat: Oropharynx is clear and moist.  Eyes: Conjunctivae and EOM are normal. Pupils are equal, round, and reactive to light. No scleral icterus.  Neck: Normal range of motion. Neck supple. No JVD present. No thyromegaly present.  Cardiovascular: Normal rate, regular rhythm and intact distal pulses. Exam reveals no gallop and no friction rub.  No murmur heard. Respiratory: Effort normal and breath sounds normal. No respiratory distress. She has no wheezes. She has no rales.  GI: Soft. Bowel sounds are normal. She exhibits no distension. There is no tenderness.  Musculoskeletal: Normal range of motion. She exhibits no edema.  No arthritis, no gout  Lymphadenopathy:    She has no cervical adenopathy.  Neurological: No cranial nerve deficit.  Unable to complete full examination on this patient as she is not really following commands.  Skin: Skin is warm and dry. No rash noted. No erythema.  Psychiatric:  Unable to assess  due to patient condition    LABORATORY PANEL:   CBC Recent Labs  Lab 08/04/17 1616  WBC 13.4*  HGB 13.1  HCT 39.0  PLT 222   ------------------------------------------------------------------------------------------------------------------  Chemistries  Recent Labs  Lab 08/04/17 1616  NA 131*  K 3.6  CL 98*  CO2 24  GLUCOSE 110*  BUN 17  CREATININE 0.71  CALCIUM 8.4*  AST 33  ALT 18  ALKPHOS 81  BILITOT 0.7   ------------------------------------------------------------------------------------------------------------------  Cardiac Enzymes Recent Labs  Lab 08/04/17 1616  TROPONINI <0.03   ------------------------------------------------------------------------------------------------------------------  RADIOLOGY:  Dg Chest 2 View  Result Date: 08/04/2017 CLINICAL DATA:  Weakness EXAM: CHEST  2 VIEW COMPARISON:  06/12/2016  FINDINGS: Right Port-A-Cath remains in place, unchanged. There is hyperinflation of the lungs compatible with COPD. Heart is normal size. No confluent airspace opacities or effusions. No acute bony abnormality. IMPRESSION: COPD.  No active disease. Electronically Signed   By: Rolm Baptise M.D.   On: 08/04/2017 20:11   Ct Head Wo Contrast  Result Date: 08/04/2017 CLINICAL DATA:  Altered level of consciousness, confusion EXAM: CT HEAD WITHOUT CONTRAST TECHNIQUE: Contiguous axial images were obtained from the base of the skull through the vertex without intravenous contrast. COMPARISON:  08/04/2014 FINDINGS: Brain: There is atrophy and chronic small vessel disease changes. Old right basal ganglia lacunar infarct. No acute intracranial abnormality. Specifically, no hemorrhage, hydrocephalus, mass lesion, acute infarction, or significant intracranial injury. Vascular: No hyperdense vessel or unexpected calcification. Skull: No acute calvarial abnormality. Sinuses/Orbits: Visualized paranasal sinuses and mastoids clear. Orbital soft tissues unremarkable.  Other: None IMPRESSION: No acute intracranial abnormality. Atrophy, chronic microvascular disease. Old right basal ganglia lacunar infarct. Electronically Signed   By: Rolm Baptise M.D.   On: 08/04/2017 19:58    EKG:   Orders placed or performed during the hospital encounter of 08/04/17  . ED EKG  . ED EKG    IMPRESSION AND PLAN:  Principal Problem:   Left leg weakness -patient will need MRI to fully evaluate possibility for stroke.  Will admit per stroke admission order set with appropriate imaging, consults and labs Active Problems:   CAD in native artery -continue home meds   Benign essential HTN -hold home antihypertensives for now, permissive hypertension for 24 hours with blood pressure less than 220/120   Paroxysmal atrial fibrillation ([place]) -continue home rate controlling medications  All the records are reviewed and case discussed with ED provider. Management plans discussed with the patient and/or family.  DVT PROPHYLAXIS: Systemic anticoagulation  GI PROPHYLAXIS: None  ADMISSION STATUS: Observation  CODE STATUS: Full Code Status History    This patient does not have a recorded code status. Please follow your organizational policy for patients in this situation.      TOTAL TIME TAKING CARE OF THIS PATIENT: 40 minutes.   Stanley Helmuth [place] 08/04/2017, 10:20 PM  [employer]  [phone]  CC: Primary care physician; [facility]  Note:  This document was prepared using Dragon voice recognition software and may include unintentional dictation errors.

## 2017-08-04 NOTE — ED Notes (Signed)
Pt's daughter reports pt has had increased weakness recently but today was a lot worse than it has been. Daughter states pt is typically an early riser but today did not want to wake up until she was woken up around 11am. It then took two people to help pt get out of bed. Pt typically walks with rolling walker on her own. Today she had to stop twice to sit on the walker to rest in a 10-yard span.

## 2017-08-04 NOTE — ED Provider Notes (Signed)
[facility] Emergency Department Provider Note  ___________________________________________   First MD Initiated Contact with Patient 08/04/17 1844     (approximate)  I have reviewed the triage vital signs and the nursing notes.   HISTORY  Chief Complaint Weakness   HPI [name] is a 84 y.o. female with a history of atrial fibrillation as well as hypertension and multiple myeloma with bony lesions who is presenting to the emergency department today with weakness.  The family states that she was confused starting yesterday, asking for "the blue thing that goes into her mouth."  Family does not know of any object that she could be talking about that would make sense for this request.  They also state that she was having difficulty walking this morning and had to rest with her walker after just taking several steps.  Patient denies any pain.  States that she does not feel any focal weakness.  Family is also concerned because the patient has not been eating or drinking and may be dehydrated.  Past Medical History:  Diagnosis Date  . Anxiety   . Arthritis   . Atrial fibrillation ([place])   . Coronary arteriosclerosis   . History of blood transfusion 2014  . Hyperlipidemia   . Hypertension   . Multiple myeloma ([place])   . Multiple myeloma in relapse ([place]) 10/29/2014  . 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 Dehydration 02/19/2017  . Pain in right hip 12/28/2015  . Pain in lower back 12/28/2015  . Rheumatoid arthritis ([place]) 03/29/2015  . Inflammatory polyarthropathy ([place]) 03/29/2015  . Multiple myeloma not having achieved remission ([place]) 10/29/2014  . Absolute anemia 10/24/2014  . CAD in native artery 10/24/2014  . Benign essential HTN 10/24/2014  . MI (mitral incompetence) 05/25/2014  . AF (paroxysmal atrial fibrillation) ([place]) 05/25/2014  . Paroxysmal atrial fibrillation ([place]) 05/25/2014     Past Surgical History:  Procedure Laterality Date  . ABDOMINAL HYSTERECTOMY    . APPENDECTOMY    . PARTIAL COLECTOMY    . TOTAL KNEE REVISION Left     Prior to Admission medications   Medication Sig Start Date End Date Taking? Authorizing Provider  acetaminophen (TYLENOL 8 HOUR) 650 MG CR tablet Take 650 mg by mouth. 1 tablet twice a day x 4 days. Start taking 2 days prior to [facility] treatment, on the day of treatment and day after treatment.    [provider]  acyclovir (ZOVIRAX) 400 MG tablet TAKE 1 TABLET BY MOUTH ONCE DAILY TO  PREVENT  SHINGLES 04/25/17   Cammie Sickle, MD  Biotin 1 MG CAPS Take 1 capsule by mouth daily.     [provider]  Calcium-Magnesium-Vitamin D (CALCIUM 500 PO) Take 1 tablet by mouth daily.     [provider]  dexamethasone (DECADRON) 4 MG tablet Take 1 tablet (4 mg total) by mouth 2 (two) times daily. Start 2 days prior to infusion; Take it for 4 days. Patient taking differently: Take 4 mg by mouth 2 (two) times daily. Start 1 days prior to infusion 12/10/16   Cammie Sickle, MD  Docusate Sodium (COLACE PO) Take 2 tablets by mouth 2 (two) times daily.    [provider]  fentaNYL (DURAGESIC - DOSED MCG/HR) 25 MCG/HR patch Place 1 patch (25 mcg total) onto the skin every 3 (three) days. 07/22/17   Cammie Sickle, MD  ferrous sulfate 325 (65 FE) MG EC tablet Take 325 mg by mouth daily.    [provider]  lidocaine-prilocaine (EMLA) cream Apply 1 application topically as needed. Apply small amount to port site at least 1 hour prior to it being accessed, cover with plastic wrap 01/15/17   Cammie Sickle, MD  loratadine (CLARITIN) 10 MG tablet Take 10 mg by mouth. 1 tablet a day x 4 days. Start taking 2 days prior to [facility] treatment, on the day of treatment and day after treatment.    [provider]  mirtazapine (REMERON) 30 MG tablet TAKE 1 TABLET BY MOUTH AT BEDTIME 08/01/17    Cammie Sickle, MD  montelukast (SINGULAIR) 10 MG tablet Take 1 tablet (10 mg total) by mouth at bedtime. Start 2 days prior to infusion. Take it for 4 days. 12/10/16   Cammie Sickle, MD  Multiple Vitamins-Minerals (CENTRUM SILVER 50+WOMEN PO) Take 1 tablet by mouth daily.     [provider]  ondansetron (ZOFRAN) 4 MG tablet Take 1 tablet (4 mg total) by mouth every 8 (eight) hours as needed for nausea or vomiting. Patient not taking: Reported on 06/19/2017 06/12/16   Rudene Re, MD  oxyCODONE (OXY IR/ROXICODONE) 5 MG immediate release tablet Take 0.5 tablets (2.5 mg total) by mouth every 8 (eight) hours as needed for severe pain. 07/22/17   Cammie Sickle, MD  polyethylene glycol (MIRALAX / GLYCOLAX) packet Take 17 g by mouth daily as needed for mild constipation.    [provider]  prochlorperazine (COMPAZINE) 10 MG tablet Take 1 tablet (10 mg total) by mouth every 8 (eight) hours as needed for nausea or vomiting. Patient not taking: Reported on 06/19/2017 04/12/16   Cammie Sickle, MD  ranitidine (ZANTAC) 150 MG capsule Take 150 mg by mouth 2 (two) times daily. 1 tablet twice a day x 4 days. Start taking 2 days prior to [facility] treatment, on the day of treatment and day after treatment.    [provider]  risperiDONE (RISPERDAL) 1 MG tablet Take 1 tablet (1 mg total) by mouth at bedtime. 06/19/17   Cammie Sickle, MD    Allergies Biaxin [clarithromycin]; Demerol [meperidine]; Hydrocodone; Sulfa antibiotics; Tramadol; and Ultracet [tramadol-acetaminophen]  Family History  Problem Relation Age of Onset  . Cancer Father   . Heart attack Mother     Social History Social History   Tobacco Use  . Smoking status: Never Smoker  . Smokeless tobacco: Never Used  Substance Use Topics  . Alcohol use: Yes    Alcohol/week: 1.2 oz    Types: 2 Glasses of wine per week  . Drug use: No    Review of Systems  Constitutional: No  fever/chills Eyes: No visual changes. ENT: No sore throat. Cardiovascular: Denies chest pain. Respiratory: Denies shortness of breath. Gastrointestinal: No abdominal pain.  No nausea, no vomiting.  No diarrhea.  No constipation. Genitourinary: Negative for dysuria. Musculoskeletal: Negative for back pain. Skin: Negative for rash. Neurological: Negative for headaches, focal weakness or numbness.   ____________________________________________   PHYSICAL EXAM:  VITAL SIGNS: ED Triage Vitals [08/04/17 1604]  Enc Vitals Group     BP 120/67     Pulse Rate 93     Resp 18     Temp 98.2 F (36.8 C)     Temp Source Oral     SpO2 96 %     Weight 100 lb (45.4 kg)     Height '5\' 1"'  (  1.549 m)     Head Circumference      Peak Flow      Pain Score      Pain Loc      Pain Edu?      Excl. in [place]?     Constitutional: Alert and oriented. Well appearing and in no acute distress. Eyes: Conjunctivae are normal.  Head: Atraumatic. Nose: No congestion/rhinnorhea. Mouth/Throat: Mucous membranes are moist.  Neck: No stridor.   Cardiovascular: Normal rate, regular rhythm. Grossly normal heart sounds.  Good peripheral circulation with equal bilateral dorsalis pedis pulses. Respiratory: Normal respiratory effort.  No retractions. Lungs CTAB. Gastrointestinal: Soft and nontender. No distention. No CVA tenderness. Musculoskeletal: No lower extremity tenderness nor edema.  No joint effusions. Neurologic:  Normal speech and language.   No facial asymmetry.  No upper extremity weakness or numbness.  Left lower extremity weak 4 out of 5 strength with right lower extremity with 5 out of 5 strength.  Sensation is intact to the bilateral lower extremity's.  Skin:  Skin is warm, dry and intact. No rash noted. Psychiatric: Mood and affect are normal. Speech and behavior are normal.  ____________________________________________   LABS (all labs ordered are listed, but only abnormal results are  displayed)  Labs Reviewed  CBC - Abnormal; Notable for the following components:      Result Value   WBC 13.4 (*)    RDW 14.9 (*)    All other components within normal limits  URINALYSIS, COMPLETE (UACMP) WITH MICROSCOPIC - Abnormal; Notable for the following components:   Color, Urine YELLOW (*)    APPearance CLEAR (*)    Squamous Epithelial / LPF 0-5 (*)    All other components within normal limits  COMPREHENSIVE METABOLIC PANEL - Abnormal; Notable for the following components:   Sodium 131 (*)    Chloride 98 (*)    Glucose, Bld 110 (*)    Calcium 8.4 (*)    All other components within normal limits  TROPONIN I  INFLUENZA PANEL BY PCR (TYPE A & B)   ____________________________________________  EKG  ED ECG REPORT I, Doran Stabler, the attending physician, personally viewed and interpreted this ECG.   Date: 08/04/2017  EKG Time: 1612  Rate: 92  Rhythm: normal sinus rhythm  Axis: Normal  Intervals:none  ST&T Change: No ST segment elevation or depression.  No abnormal T wave inversion.  ____________________________________________  RADIOLOGY  Chest x-ray without acute disease.  Head CT without acute process.  Old lacunar infarct seen. ____________________________________________   PROCEDURES  Procedure(s) performed:   Procedures  Critical Care performed:   ____________________________________________   INITIAL IMPRESSION / ASSESSMENT AND PLAN / ED COURSE  Pertinent labs & imaging results that were available during my care of the patient were reviewed by me and considered in my medical decision making (see chart for details).  Differential diagnosis includes, but is not limited to, alcohol, illicit or prescription medications, or other toxic ingestion; intracranial pathology such as stroke or intracerebral hemorrhage; fever or infectious causes including sepsis; hypoxemia and/or hypercarbia; uremia; trauma; endocrine related disorders such as diabetes,  hypoglycemia, and thyroid-related diseases; hypertensive encephalopathy; etc. As part of my medical decision making, I reviewed the following data within the [place] chart reviewed and Notes from prior ED visits  ----------------------------------------- 9:45 PM on 08/04/2017 -----------------------------------------  Patient without any improvement after fluids.  Unclear cause of the elevated white blood cell count.  No acute finding on the CT of  the head.  Because the patient has focal weakness and has an acute change in her mentation as well as neurologic exam over the past 24 hours she will be admitted to the hospital for further workup.  Out of the window for TPA.  no findings to indicate large vessel occlusion.  Signed out to Dr. Jannifer Franklin.  Patient will be admitted to the hospital.  Family as well as patient understanding of this plan willing to comply.  No back pain.      ____________________________________________   FINAL CLINICAL IMPRESSION(S) / ED DIAGNOSES  Left lower extremity weakness.  Failure to thrive.  Altered mentation.    NEW MEDICATIONS STARTED DURING THIS VISIT:  New Prescriptions   No medications on file     Note:  This document was prepared using Dragon voice recognition software and may include unintentional dictation errors.     Orbie Pyo, MD 08/04/17 [phone]

## 2017-08-04 NOTE — ED Triage Notes (Signed)
Per daughter in law patient did not wake up til 11am today and was weak when she got up and this is not normal for her. More confusion than usual. States "stomache rumbling" but no complaints of pain. States has back pain at triage.

## 2017-08-05 ENCOUNTER — Observation Stay: Payer: Medicare Other

## 2017-08-05 ENCOUNTER — Observation Stay (HOSPITAL_BASED_OUTPATIENT_CLINIC_OR_DEPARTMENT_OTHER)
Admit: 2017-08-05 | Discharge: 2017-08-05 | Disposition: A | Discharge status: Home / Self Care | Payer: Medicare Other | Attending: Internal Medicine | Admitting: Internal Medicine

## 2017-08-05 ENCOUNTER — Telehealth: Payer: Self-pay | Admitting: *Deleted

## 2017-08-05 DIAGNOSIS — I2510 Atherosclerotic heart disease of native coronary artery without angina pectoris: Secondary | ICD-10-CM

## 2017-08-05 DIAGNOSIS — R29898 Other symptoms and signs involving the musculoskeletal system: Secondary | ICD-10-CM | POA: Diagnosis not present

## 2017-08-05 DIAGNOSIS — I10 Essential (primary) hypertension: Secondary | ICD-10-CM | POA: Diagnosis not present

## 2017-08-05 DIAGNOSIS — G459 Transient cerebral ischemic attack, unspecified: Secondary | ICD-10-CM

## 2017-08-05 DIAGNOSIS — R4182 Altered mental status, unspecified: Secondary | ICD-10-CM

## 2017-08-05 LAB — LIPID PANEL
CHOL/HDL RATIO: 3.3 ratio
CHOLESTEROL: 169 mg/dL (ref 0–200)
HDL: 51 mg/dL (ref >40)
LDL Cholesterol: 108 mg/dL — ABNORMAL HIGH (ref 0–99)
Triglycerides: 48 mg/dL (ref <150)
VLDL: 10 mg/dL (ref 0–40)

## 2017-08-05 LAB — ECHOCARDIOGRAM COMPLETE
Height: 62 in
Weight: 1630.4 oz

## 2017-08-05 MED ORDER — ACYCLOVIR 200 MG PO CAPS
400.0000 mg | ORAL_CAPSULE | Freq: Every day | ORAL | Status: DC
  Administered 2017-08-05 (×2): 400 mg via ORAL
  Filled 2017-08-05 (×3): qty 2

## 2017-08-05 MED ORDER — OXYCODONE HCL 5 MG PO TABS: 2.5000 mg | ORAL_TABLET | Freq: Three times a day (TID) | ORAL | Status: DC | PRN

## 2017-08-05 MED ORDER — MEGESTROL ACETATE 400 MG/10ML PO SUSP
400.0000 mg | Freq: Two times a day (BID) | ORAL | Status: DC
  Administered 2017-08-05 – 2017-08-06 (×3): 400 mg via ORAL
  Filled 2017-08-05 (×4): qty 10

## 2017-08-05 MED ORDER — ENOXAPARIN SODIUM 40 MG/0.4ML ~~LOC~~ SOLN
40.0000 mg | SUBCUTANEOUS | Status: DC
  Administered 2017-08-05: 22:00:00 40 mg via SUBCUTANEOUS
  Filled 2017-08-05: qty 0.4

## 2017-08-05 MED ORDER — SODIUM CHLORIDE 0.9 % IV SOLN
INTRAVENOUS | Status: DC
  Administered 2017-08-05: 15:00:00 via INTRAVENOUS

## 2017-08-05 NOTE — Plan of Care (Signed)
  Education: Knowledge of disease or condition will improve 08/05/2017 0440 - Not Progressing by Jeffie Pollock, RN Knowledge of secondary prevention will improve 08/05/2017 0440 - Progressing by Jeffie Pollock, RN   Coping: Will verbalize positive feelings about self 08/05/2017 0440 - Progressing by Jeffie Pollock, RN   Self-Care: Ability to participate in self-care as condition permits will improve 08/05/2017 0440 - Progressing by Jeffie Pollock, RN   Nutrition: Risk of aspiration will decrease 08/05/2017 0440 - Progressing by Jeffie Pollock, RN   Ischemic Stroke/TIA Tissue Perfusion: Complications of ischemic stroke/TIA will be minimized 08/05/2017 0440 - Progressing by Jeffie Pollock, RN   Clinical Measurements: Ability to maintain clinical measurements within normal limits will improve 08/05/2017 0440 - Progressing by Jeffie Pollock, RN   Safety: Ability to remain free from injury will improve 08/05/2017 0440 - Progressing by Jeffie Pollock, RN

## 2017-08-05 NOTE — Care Management (Signed)
Patient admitted from home for r/o stroke.  Patient lives at home alone.  Patient son and significant other [name] at bedside.  Per [name] patient was seen by PT and recommendation of home health.  Awaiting PT note.  Patient has rollator, and shower seat in the home.  PCP [place] . Family provides transportation when needed.  Patient followed by outpatient palliative.  Santiago Glad with [facility] aware of admission.  [name] states that her uncle works for [facility] and would like to use [facility] at discharge.  Heads up referral made to Peterson Ao with [facility].  RNCM following.

## 2017-08-05 NOTE — Progress Notes (Signed)
Please note patient is currently followed by out patient [facility] at home. CMRN [facility] and staff RN Stanton Kidney made aware. Flo Shanks RN, BSN, [place] [facility], hospital Liaison [phone]

## 2017-08-05 NOTE — Progress Notes (Addendum)
[place] [facility]                                                                                                                                                                                  Patient Demographics   [name], is a 84 y.o. female, DOB - [birth year], WUX:[id number]  Admit date - 08/04/2017   Admitting Physician Lance Coon, MD  Outpatient Primary MD for the patient is [place], [facility]   LOS - 0  Subjective: Pt patient admitted with generalized weakness and some confusion Patient has had poor appetite and poor poor p.o. intake progressive over a year and a half now  Review of Systems:   CONSTITUTIONAL: No documented fever. No fatigue, positive weakness. No weight gain, no weight loss.  EYES: No blurry or double vision.  ENT: No tinnitus. No postnasal drip. No redness of the oropharynx.  RESPIRATORY: No cough, no wheeze, no hemoptysis. No dyspnea.  CARDIOVASCULAR: No chest pain. No orthopnea. No palpitations. No syncope.  GASTROINTESTINAL: No nausea, no vomiting or diarrhea. No abdominal pain. No melena or hematochezia.  GENITOURINARY: No dysuria or hematuria.  ENDOCRINE: No polyuria or nocturia. No heat or cold intolerance.  HEMATOLOGY: No anemia. No bruising. No bleeding.  INTEGUMENTARY: No rashes. No lesions.  MUSCULOSKELETAL: No arthritis. No swelling. No gout.  NEUROLOGIC: No numbness, tingling, or ataxia. No seizure-type activity.  PSYCHIATRIC: No anxiety. No insomnia. No ADD.    Vitals:   Vitals:   08/05/17 0326 08/05/17 0452 08/05/17 0759 08/05/17 1006  BP: (!) 136/48 (!) 121/59 (!) 130/57 128/60  Pulse: 95 91 92 90  Resp: _0 Temp: 98.6 F (37 C) 98.2 F (36.8 C) 98.8 F (37.1 C) 98.7 F (37.1 C)  TempSrc: Oral Oral  Oral  SpO2: 97% 95% 94% 96%  Weight:      Height:        Wt Readings from Last 3 Encounters:  08/04/17 101 lb 14.4 oz (46.2 kg)  06/19/17 102 lb 6.4 oz (46.4 kg)  05/07/17  105 lb (47.6 kg)     Intake/Output Summary (Last 24 hours) at 08/05/2017 1343 Last data filed at 08/05/2017 0900 Gross per 24 hour  Intake 1360 ml  Output -  Net 1360 ml    Physical Exam:   GENERAL: Pleasant-appearing in no apparent distress.  HEAD, EYES, EARS, NOSE AND THROAT: Atraumatic, normocephalic. Extraocular muscles are intact. Pupils equal and reactive to light. Sclerae anicteric. No conjunctival injection. No oro-pharyngeal erythema.  NECK: Supple. There is no jugular venous distention. No bruits, no lymphadenopathy, no thyromegaly.  HEART: Regular rate and rhythm,. No murmurs, no rubs,  no clicks.  LUNGS: Clear to auscultation bilaterally. No rales or rhonchi. No wheezes.  ABDOMEN: Soft, flat, nontender, nondistended. Has good bowel sounds. No hepatosplenomegaly appreciated.  EXTREMITIES: No evidence of any cyanosis, clubbing, or peripheral edema.  +2 pedal and radial pulses bilaterally.  NEUROLOGIC: The patient is alert, awake, and oriented x3 with no focal motor or sensory deficits appreciated bilaterally.  SKIN: Moist and warm with no rashes appreciated.  Psych: Not anxious, depressed LN: No inguinal LN enlargement    Antibiotics   Anti-infectives (From admission, onward)   Start     Dose/Rate Route Frequency Ordered Stop   08/05/17 0030  acyclovir (ZOVIRAX) 200 MG capsule 400 mg     400 mg Oral Daily at bedtime 08/05/17 0029     08/04/17 2330  acyclovir (ZOVIRAX) tablet 400 mg  Status:  Discontinued    Comments:  TAKE 1 TABLET BY MOUTH ONCE DAILY TO  PREVENT  SHINGLES     400 mg Oral Daily at bedtime 08/04/17 2317 08/05/17 0029      Medications   Scheduled Meds: . acyclovir  400 mg Oral QHS  . fentaNYL  25 mcg Transdermal Q72H  . megestrol  400 mg Oral BID  . mirtazapine  30 mg Oral QHS  . risperiDONE  1 mg Oral QHS   Continuous Infusions: PRN Meds:.acetaminophen **OR** acetaminophen (TYLENOL) oral liquid 160 mg/5 mL **OR** acetaminophen,  oxyCODONE   Data Review:   Micro Results No results found for this or any previous visit (from the past 240 hour(s)).  Radiology Reports Dg Chest 2 View  Result Date: 08/04/2017 CLINICAL DATA:  Weakness EXAM: CHEST  2 VIEW COMPARISON:  06/12/2016 FINDINGS: Right Port-A-Cath remains in place, unchanged. There is hyperinflation of the lungs compatible with COPD. Heart is normal size. No confluent airspace opacities or effusions. No acute bony abnormality. IMPRESSION: COPD.  No active disease. Electronically Signed   By: Rolm Baptise M.D.   On: 08/04/2017 20:11   Ct Head Wo Contrast  Result Date: 08/04/2017 CLINICAL DATA:  Altered level of consciousness, confusion EXAM: CT HEAD WITHOUT CONTRAST TECHNIQUE: Contiguous axial images were obtained from the base of the skull through the vertex without intravenous contrast. COMPARISON:  08/04/2014 FINDINGS: Brain: There is atrophy and chronic small vessel disease changes. Old right basal ganglia lacunar infarct. No acute intracranial abnormality. Specifically, no hemorrhage, hydrocephalus, mass lesion, acute infarction, or significant intracranial injury. Vascular: No hyperdense vessel or unexpected calcification. Skull: No acute calvarial abnormality. Sinuses/Orbits: Visualized paranasal sinuses and mastoids clear. Orbital soft tissues unremarkable. Other: None IMPRESSION: No acute intracranial abnormality. Atrophy, chronic microvascular disease. Old right basal ganglia lacunar infarct. Electronically Signed   By: Rolm Baptise M.D.   On: 08/04/2017 19:58   Mr Brain Wo Contrast  Result Date: 08/05/2017 CLINICAL DATA:  84 year old female with left lower extremity weakness and confusion for 24 hours. EXAM: MRI HEAD WITHOUT CONTRAST MRA HEAD WITHOUT CONTRAST TECHNIQUE: Multiplanar, multiecho pulse sequences of the brain and surrounding structures were obtained without intravenous contrast. Angiographic images of the head were obtained using MRA technique  without contrast. COMPARISON:  Head CT without contrast 08/04/2017 and earlier. Brain MRI 07/21/2014. FINDINGS: MRI HEAD FINDINGS Brain: Cerebral volume is stable since 2016. No restricted diffusion or evidence of acute infarction. A chronic lacunar infarct in the central right thalamus is new since 2016. Stable T2 heterogeneity elsewhere throughout the deep gray matter nuclei which appears mostly due to perivascular spaces. Mild for age T2 heterogeneity in the  pons is stable since 2016. Scattered Patchy and confluent bilateral cerebral white matter T2 and FLAIR hyperintensity has not significantly changed since 2016. No cortical encephalomalacia or chronic cerebral blood products identified. No midline shift, mass effect, evidence of mass lesion, ventriculomegaly, extra-axial collection or acute intracranial hemorrhage. Cervicomedullary junction and pituitary are within normal limits. Vascular: Major intracranial vascular flow voids are stable since 2016. Skull and upper cervical spine: Negative for age visible cervical spine. Visualized bone marrow signal is within normal limits. Sinuses/Orbits: Stable and negative. Other: Visible internal auditory structures appear normal. Mastoid air cells remain clear. Scalp and face soft tissues appear negative. MRA HEAD FINDINGS Antegrade flow in the posterior circulation with no distal vertebral artery stenosis. Mildly dominant left vertebral artery. Normal left PICA origin. Normal vertebrobasilar junction. No basilar stenosis. AICA and SCA origins appear patent. Bilateral PCA origins are within normal limits. Posterior communicating arteries are diminutive or absent. There is bilateral moderate to severe distal P1/P2 segment PCA stenoses (series 107). MIP images also suggest severe distal right P2 stenosis, although on source images the other right PCA branches appear within normal limits. Antegrade flow in both ICA siphons. No siphon stenosis. Normal ophthalmic artery  origins. Patent carotid termini. Normal MCA and ACA origins. The anterior communicating artery is diminutive or absent. There is mild to moderate stenosis of the right ACA A2 segment (series 105, image 8), but preserved distal flow. The left MCA M1 segment is patent without stenosis. The left MCA bifurcation is patent. Questionable moderate to severe stenosis in the dominant posterior left MCA branch (might be artifact). Otherwise the visible left MCA branches appear within normal limits. The right MCA M1 segment is patent with an early bifurcation. The right MCA bifurcation is patent. The major right MCA scratched at the visible right MCA branches are patent. There is mild to moderate stenosis in the proximal aspect of the dominant right MCA M2 branch (series 105, image 12). IMPRESSION: 1. No acute infarct or acute intracranial abnormality is identified. 2. No intracranial large vessel occlusion, but intracranial MRA is remarkable for widespread moderate and severe 2nd order circle-of-Willis branch atherosclerosis and stenosis, including moderate stenosis of the dominant right MCA posterior M2 branch. 3. Sequelae of chronic small vessel disease in the brain is only mildly progressed since 2016. Electronically Signed   By: Genevie Ann M.D.   On: 08/05/2017 13:00   US Carotid Bilateral (at [facility] Only)  Result Date: 08/05/2017 CLINICAL DATA:  84 year old female with a history of left leg weakness. Cardiovascular risk factors include hypertension EXAM: BILATERAL CAROTID DUPLEX ULTRASOUND TECHNIQUE: Pearline Cables scale imaging, color Doppler and duplex ultrasound were performed of bilateral carotid and vertebral arteries in the neck. COMPARISON:  No prior duplex FINDINGS: Criteria: Quantification of carotid stenosis is based on velocity parameters that correlate the residual internal carotid diameter with NASCET-based stenosis levels, using the diameter of the distal internal carotid lumen as the denominator for stenosis  measurement. The following velocity measurements were obtained: RIGHT ICA:  Systolic 86 cm/sec, Diastolic 13 cm/sec CCA:  95 cm/sec SYSTOLIC ICA/CCA RATIO:  0.9 ECA:  114 cm/sec LEFT ICA:  Systolic 93 cm/sec, Diastolic 21 cm/sec CCA:  818 cm/sec SYSTOLIC ICA/CCA RATIO:  0.9 ECA:  107 cm/sec Right Brachial SBP: Not acquired Left Brachial SBP: Not acquired RIGHT CAROTID ARTERY: No significant calcifications of the right common carotid artery. Intermediate waveform maintained. Heterogeneous and partially calcified plaque at the right carotid bifurcation. No significant lumen shadowing. Low resistance waveform  of the right ICA. No significant tortuosity. RIGHT VERTEBRAL ARTERY: Antegrade flow with low resistance waveform. LEFT CAROTID ARTERY: No significant calcifications of the left common carotid artery. Intermediate waveform maintained. Heterogeneous and partially calcified plaque at the left carotid bifurcation without significant lumen shadowing. Low resistance waveform of the left ICA. No significant tortuosity. LEFT VERTEBRAL ARTERY:  Antegrade flow with low resistance waveform. IMPRESSION: Color duplex indicates minimal heterogeneous and calcified plaque, with no hemodynamically significant stenosis by duplex criteria in the extracranial cerebrovascular circulation. Signed, Dulcy Fanny. Earleen Newport, DO [facility] Electronically Signed   By: Corrie Mckusick D.O.   On: 08/05/2017 13:27   Mr Jodene Nam Head/brain GU Cm  Result Date: 08/05/2017 CLINICAL DATA:  84 year old female with left lower extremity weakness and confusion for 24 hours. EXAM: MRI HEAD WITHOUT CONTRAST MRA HEAD WITHOUT CONTRAST TECHNIQUE: Multiplanar, multiecho pulse sequences of the brain and surrounding structures were obtained without intravenous contrast. Angiographic images of the head were obtained using MRA technique without contrast. COMPARISON:  Head CT without contrast 08/04/2017 and  earlier. Brain MRI 07/21/2014. FINDINGS: MRI HEAD FINDINGS Brain: Cerebral volume is stable since 2016. No restricted diffusion or evidence of acute infarction. A chronic lacunar infarct in the central right thalamus is new since 2016. Stable T2 heterogeneity elsewhere throughout the deep gray matter nuclei which appears mostly due to perivascular spaces. Mild for age T2 heterogeneity in the pons is stable since 2016. Scattered Patchy and confluent bilateral cerebral white matter T2 and FLAIR hyperintensity has not significantly changed since 2016. No cortical encephalomalacia or chronic cerebral blood products identified. No midline shift, mass effect, evidence of mass lesion, ventriculomegaly, extra-axial collection or acute intracranial hemorrhage. Cervicomedullary junction and pituitary are within normal limits. Vascular: Major intracranial vascular flow voids are stable since 2016. Skull and upper cervical spine: Negative for age visible cervical spine. Visualized bone marrow signal is within normal limits. Sinuses/Orbits: Stable and negative. Other: Visible internal auditory structures appear normal. Mastoid air cells remain clear. Scalp and face soft tissues appear negative. MRA HEAD FINDINGS Antegrade flow in the posterior circulation with no distal vertebral artery stenosis. Mildly dominant left vertebral artery. Normal left PICA origin. Normal vertebrobasilar junction. No basilar stenosis. AICA and SCA origins appear patent. Bilateral PCA origins are within normal limits. Posterior communicating arteries are diminutive or absent. There is bilateral moderate to severe distal P1/P2 segment PCA stenoses (series 107). MIP images also suggest severe distal right P2 stenosis, although on source images the other right PCA branches appear within normal limits. Antegrade flow in both ICA siphons. No siphon stenosis. Normal ophthalmic artery origins. Patent carotid termini. Normal MCA and ACA origins. The anterior  communicating artery is diminutive or absent. There is mild to moderate stenosis of the right ACA A2 segment (series 105, image 8), but preserved distal flow. The left MCA M1 segment is patent without stenosis. The left MCA bifurcation is patent. Questionable moderate to severe stenosis in the dominant posterior left MCA branch (might be artifact). Otherwise the visible left MCA branches appear within normal limits. The right MCA M1 segment is patent with an early bifurcation. The right MCA bifurcation is patent. The major right MCA scratched at the visible right MCA branches are patent. There is mild to moderate stenosis in the proximal aspect of the dominant right MCA M2 branch (series 105, image 12). IMPRESSION: 1. No acute infarct or acute intracranial abnormality is identified. 2. No intracranial large vessel occlusion, but intracranial MRA is  remarkable for widespread moderate and severe 2nd order circle-of-Willis branch atherosclerosis and stenosis, including moderate stenosis of the dominant right MCA posterior M2 branch. 3. Sequelae of chronic small vessel disease in the brain is only mildly progressed since 2016. Electronically Signed   By: Genevie Ann M.D.   On: 08/05/2017 13:00     CBC Recent Labs  Lab 08/04/17 1616  WBC 13.4*  HGB 13.1  HCT 39.0  PLT 222  MCV 88.9  MCH 29.8  MCHC 33.6  RDW 14.9*    Chemistries  Recent Labs  Lab 08/04/17 1616  NA 131*  K 3.6  CL 98*  CO2 24  GLUCOSE 110*  BUN 17  CREATININE 0.71  CALCIUM 8.4*  AST 33  ALT 18  ALKPHOS 81  BILITOT 0.7   ------------------------------------------------------------------------------------------------------------------ estimated creatinine clearance is 34.1 mL/min (by C-G formula based on SCr of 0.71 mg/dL). ------------------------------------------------------------------------------------------------------------------ No results for input(s): HGBA1C in the last 72  hours. ------------------------------------------------------------------------------------------------------------------ Recent Labs    08/05/17 0312  CHOL 169  HDL 51  LDLCALC 108*  TRIG 48  CHOLHDL 3.3   ------------------------------------------------------------------------------------------------------------------ No results for input(s): TSH, T4TOTAL, T3FREE, THYROIDAB in the last 72 hours.  Invalid input(s): FREET3 ------------------------------------------------------------------------------------------------------------------ No results for input(s): VITAMINB12, FOLATE, FERRITIN, TIBC, IRON, RETICCTPCT in the last 72 hours.  Coagulation profile No results for input(s): INR, PROTIME in the last 168 hours.  No results for input(s): DDIMER in the last 72 hours.  Cardiac Enzymes Recent Labs  Lab 08/04/17 1616  TROPONINI <0.03   ------------------------------------------------------------------------------------------------------------------ Invalid input(s): POCBNP    Assessment & Plan  Patient is 84 year old with history of multiple myeloma presenting with generalized weakness and deconditioning  1.  Generalized weakness with lower extremity weakness: Felt to be due to dehydration Patient underwent MRI of the brain which showed no stroke Awaiting PT evaluation  2.  Multiple myeloma outpatient follow-up with oncology prognosis poor Patient already has palliative care that has evaluated the patient as outpatient  3.   CAD in native artery -continue home meds    4.  Benign essential hypertension blood pressures currently stable no antihypertensives  5.   Poor appetite continue Remeron, add Megace  6. Paroxysmal afib- asa, not admitted for full anticoagulation due to fall risk        Code Status Orders  (From admission, onward)        Start     Ordered   08/05/17 1300  Do not attempt resuscitation (DNR)  Continuous    Question Answer Comment  In the  event of cardiac or respiratory ARREST Do not call a "code blue"   In the event of cardiac or respiratory ARREST Do not perform Intubation, CPR, defibrillation or ACLS   In the event of cardiac or respiratory ARREST Use medication by any route, position, wound care, and other measures to relive pain and suffering. May use oxygen, suction and manual treatment of airway obstruction as needed for comfort.      08/05/17 1300    Code Status History    Date Active Date Inactive Code Status Order ID Comments User Context   08/04/2017 23:17 08/05/2017 13:00 Full Code [id number]  Lance Coon, MD Inpatient           Consults   neuro DVT Prophylaxis  Lovenox   Lab Results  Component Value Date   PLT 222 08/04/2017     Time Spent in minutes  23mn  Greater than 50% of time spent in care coordination  and counseling patient regarding the condition and plan of care.   Dustin Flock M.D on 08/05/2017 at 1:43 PM  Between 7am to 6pm - Pager - [phone]  After 6pm go to [email or web address] - password [email or web address] [facility]   Office  [phone]

## 2017-08-05 NOTE — Evaluation (Signed)
Occupational Therapy Evaluation Patient Details Name: [name] MRN: [id number] DOB: September 10, 1924 Today's Date: 08/05/2017    History of Present Illness 84 y/o female here after AMS, weakness and unability to walk at home. Pt here to r/o CVA, MRI and CT (-).   Clinical Impression   Pt seen for OT evaluation this date. PTA, pt living by herself with family that checks in on her multiple times per day and assists with IADL and tub transfers. Pt with hx of falls (once when attempting to go after her dog in the back yard). Pt presents with generalized weakness, even bilaterally UE/LE, intact sensation, no visual deficits, and mild cognitive deficits (likely baseline, but no family present to confirm) including safety awareness. Pt will benefit from skilled OT services to address noted impairments and functional deficits including pt/caregiver education/training with DME/AE as appropriate, home safety/set up, falls prevention, and self care training in order for pt to be maximally safe at home. Also strongly recommend 24/7 supervision given cognitive deficits and decreased safety awareness.     Follow Up Recommendations  Home health OT;Supervision/Assistance - 24 hour    Equipment Recommendations  None recommended by OT    Recommendations for Other Services       Precautions / Restrictions Precautions Precautions: Fall Restrictions Weight Bearing Restrictions: No      Mobility Bed Mobility             General bed mobility comments: deferred, up in recliner  Transfers Overall transfer level: Modified independent Equipment used: Rolling walker (2 wheeled)             General transfer comment: no LOB, minimal VC    Balance Overall balance assessment: Modified Independent                                         ADL either performed or assessed with clinical judgement   ADL Overall ADL's : Needs assistance/impaired Eating/Feeding: Independent    Grooming: Sitting;Independent   Upper Body Bathing: Sitting;Supervision/ safety   Lower Body Bathing: Sit to/from stand;Min guard   Upper Body Dressing : Sitting;Supervision/safety   Lower Body Dressing: Sit to/from stand;Min guard   Toilet Transfer: RW;BSC;Ambulation;Min guard           Functional mobility during ADLs: Rolling walker;Min guard       Vision Baseline Vision/History: Wears glasses Wears Glasses: At all times Patient Visual Report: No change from baseline Vision Assessment?: No apparent visual deficits     Perception     Praxis      Pertinent Vitals/Pain Pain Assessment: No/denies pain     Hand Dominance     Extremity/Trunk Assessment Upper Extremity Assessment Upper Extremity Assessment: Generalized weakness(grossly weak bilaterally 4-/5, intact sensation/coordination)   Lower Extremity Assessment Lower Extremity Assessment: Generalized weakness(even bilaterally, intact sensation)   Cervical / Trunk Assessment Cervical / Trunk Assessment: Normal   Communication Communication Communication: HOH(mild delay in response time but appropriate)   Cognition Arousal/Alertness: Awake/alert Behavior During Therapy: Flat affect Overall Cognitive Status: History of cognitive impairments - at baseline                                 General Comments: Pt pleasant, oriented to self, able to follow simple commands   General Comments       Exercises Other  Exercises Other Exercises: OT facilitated situational safety awareness problem solving through scenarios, pt unable to provide maximally safe responses (e.g., if the window is broken when she comes home who would she call? her response: my son down the street versus calling the police)   Shoulder Instructions      Home Living Family/patient expects to be discharged to:: Private residence Living Arrangements: Alone Available Help at Discharge: Family(multiple family check-ins t/o the  day) Type of Home: House Home Access: Ramped entrance(back deck 2+1)     Home Layout: One level     Bathroom Shower/Tub: Tub/shower unit         Home Equipment: Environmental consultant - 4 wheels          Prior Functioning/Environment Level of Independence: Independent with assistive device(s)        Comments: Family reports she only gets out of the house for MD appointments but is able to manage         OT Problem List: Decreased cognition;Decreased activity tolerance;Decreased strength;Decreased knowledge of use of DME or AE;Decreased safety awareness      OT Treatment/Interventions: Self-care/ADL training;Balance training;Therapeutic exercise;Therapeutic activities;DME and/or AE instruction;Patient/family education;Energy conservation    OT Goals(Current goals can be found in the care plan section) Acute Rehab OT Goals Patient Stated Goal: go home OT Goal Formulation: With patient Time For Goal Achievement: 08/19/17 Potential to Achieve Goals: Good ADL Goals Pt Will Perform Lower Body Dressing: sit to/from stand;with supervision Pt Will Transfer to Toilet: with supervision;ambulating  OT Frequency: Min 1X/week   Barriers to D/C:            Co-evaluation              AM-PAC PT "6 Clicks" Daily Activity     Outcome Measure Help from another person eating meals?: None Help from another person taking care of personal grooming?: None Help from another person toileting, which includes using toliet, bedpan, or urinal?: A Little Help from another person bathing (including washing, rinsing, drying)?: A Little Help from another person to put on and taking off regular upper body clothing?: A Little Help from another person to put on and taking off regular lower body clothing?: A Little 6 Click Score: 20   End of Session Equipment Utilized During Treatment: Rolling walker;Gait belt  Activity Tolerance: Patient tolerated treatment well Patient left: in chair;with call  bell/phone within reach;with chair alarm set  OT Visit Diagnosis: Other abnormalities of gait and mobility (R26.89);History of falling (Z91.81);Muscle weakness (generalized) (M62.81)                Time: 0762-2633 OT Time Calculation (min): 18 min Charges:  OT General Charges $OT Visit: 1 Visit OT Evaluation $OT Eval Low Complexity: 1 Low  Jeni Salles, MPH, MS, OTR/L ascom [phone] 08/05/17, 5:42 PM

## 2017-08-05 NOTE — Consult Note (Signed)
Referring Physician: Posey Pronto    Chief Complaint: LLE weakness  HPI: [name] is an 84 y.o. female with a history of atrial fibrillation on no anticoagulation who per report of son lives alone.  She is checked on multiple times a day by family members.  On yesterday did not awakening as usual.  A neighbor was unable to get her to answer to drop the door.  When the son arrived the patient was sitting on side of the bed and was unable to stand up.  Patient was unable to ambulate without 2+ assist.  Patient was brought to the emergency room at that time.  Prior to coming to the emergency room they thought she might be dehydrated.  They attempted to hydrate her with no success.  On the day prior to yesterday the patient had been noted by family to have some confusion.  Patient does have a history of dementia.  This has been progressive over the past year.  On evaluation in the emergency room the patient was felt to have some left lower extremity weakness.  Initial NIH stroke scale of 1  Date last known well: Unable to determine Time last known well: Unable to determine tPA Given: No: Unable to determine last known well  Past Medical History:  Diagnosis Date  . Anxiety   . Arthritis   . Atrial fibrillation ([place])   . Coronary arteriosclerosis   . History of blood transfusion 2014  . Hyperlipidemia   . Hypertension   . Multiple myeloma ([place])   . Multiple myeloma in relapse ([place]) 10/29/2014  . 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 ABDOMINAL HYSTERECTOMY    . APPENDECTOMY    . PARTIAL COLECTOMY    . TOTAL KNEE REVISION Left     Family History  Problem Relation Age of Onset  . Cancer Father   . Heart attack Mother    Social History:  reports that  has never smoked. she has never used smokeless tobacco. She reports that she drinks alcohol. She reports that she does not use drugs.  Allergies:  Allergies  Allergen Reactions  .  Biaxin [Clarithromycin] Diarrhea and Nausea And Vomiting  . Demerol [Meperidine] Nausea And Vomiting  . Hydrocodone Nausea And Vomiting  . Sulfa Antibiotics Hives  . Tramadol Diarrhea and Nausea And Vomiting  . Ultracet [Tramadol-Acetaminophen] Other (See Comments)    Medications:  I have reviewed the patient's current medications. Prior to Admission:  Medications Prior to Admission  Medication Sig Dispense Refill Last Dose  . acyclovir (ZOVIRAX) 400 MG tablet TAKE 1 TABLET BY MOUTH ONCE DAILY TO  PREVENT  SHINGLES (Patient taking differently: Take 400 mg by mouth at bedtime. TAKE 1 TABLET BY MOUTH ONCE DAILY TO  PREVENT  SHINGLES) 30 tablet 3 08/03/2017 at Unknown time  . Docusate Sodium (COLACE PO) Take 2 tablets by mouth at bedtime.    08/03/2017 at Unknown time  . fentaNYL (DURAGESIC - DOSED MCG/HR) 25 MCG/HR patch Place 1 patch (25 mcg total) onto the skin every 3 (three) days. 10 patch 0 08/03/2017 at Unknown time  . lidocaine-prilocaine (EMLA) cream Apply 1 application topically as needed. Apply small amount to port site at least 1 hour prior to it being accessed, cover with plastic wrap 30 g 1 Taking  . mirtazapine (REMERON) 30 MG tablet TAKE 1 TABLET BY MOUTH AT BEDTIME 60 tablet 2 08/03/2017 at Unknown time  .  ondansetron (ZOFRAN) 4 MG tablet Take 1 tablet (4 mg total) by mouth every 8 (eight) hours as needed for nausea or vomiting. 20 tablet 0 prn  . oxyCODONE (OXY IR/ROXICODONE) 5 MG immediate release tablet Take 0.5 tablets (2.5 mg total) by mouth every 8 (eight) hours as needed for severe pain. 30 tablet 0 08/03/2017 at Unknown time  . polyethylene glycol (MIRALAX / GLYCOLAX) packet Take 17 g by mouth at bedtime as needed for mild constipation.    08/03/2017 at Unknown time  . risperiDONE (RISPERDAL) 1 MG tablet Take 1 tablet (1 mg total) by mouth at bedtime. 90 tablet 3 08/03/2017 at Unknown time  . dexamethasone (DECADRON) 4 MG tablet Take 1 tablet (4 mg total) by mouth 2 (two) times  daily. Start 2 days prior to infusion; Take it for 4 days. (Patient not taking: Reported on 08/04/2017) 60 tablet 3 Not Taking at Unknown time  . montelukast (SINGULAIR) 10 MG tablet Take 1 tablet (10 mg total) by mouth at bedtime. Start 2 days prior to infusion. Take it for 4 days. (Patient not taking: Reported on 08/04/2017) 60 tablet 3 Not Taking at Unknown time  . prochlorperazine (COMPAZINE) 10 MG tablet Take 1 tablet (10 mg total) by mouth every 8 (eight) hours as needed for nausea or vomiting. (Patient not taking: Reported on 06/19/2017) 30 tablet 1 Not Taking at Unknown time   Scheduled: . acyclovir  400 mg Oral QHS  . fentaNYL  25 mcg Transdermal Q72H  . mirtazapine  30 mg Oral QHS  . risperiDONE  1 mg Oral QHS    ROS: History obtained from the patient and son  General ROS: negative for - chills, fatigue, fever, night sweats, weight gain or weight loss Psychological ROS: memory difficulties Ophthalmic ROS: negative for - blurry vision, double vision, eye pain or loss of vision ENT ROS: negative for - epistaxis, nasal discharge, oral lesions, sore throat, tinnitus or vertigo Allergy and Immunology ROS: negative for - hives or itchy/watery eyes Hematological and Lymphatic ROS: negative for - bleeding problems, bruising or swollen lymph nodes Endocrine ROS: negative for - galactorrhea, hair pattern changes, polydipsia/polyuria or temperature intolerance Respiratory ROS: negative for - cough, hemoptysis, shortness of breath or wheezing Cardiovascular ROS: negative for - chest pain, dyspnea on exertion, edema or irregular heartbeat Gastrointestinal ROS: negative for - abdominal pain, diarrhea, hematemesis, nausea/vomiting or stool incontinence Genito-Urinary ROS: negative for - dysuria, hematuria, incontinence or urinary frequency/urgency Musculoskeletal ROS: back and joint pain Neurological ROS: as noted in HPI Dermatological ROS: negative for rash and skin lesion changes  Physical  Examination: Blood pressure 128/60, pulse 90, temperature 98.7 F (37.1 C), temperature source Oral, resp. rate 18, height '5\' 2"'  (1.575 m), weight 46.2 kg (101 lb 14.4 oz), SpO2 96 %.  HEENT-  Normocephalic, no lesions, without obvious abnormality.  Normal external eye and conjunctiva.  Normal TM's bilaterally.  Normal auditory canals and external ears. Normal external nose, mucus membranes and septum.  Normal pharynx. Cardiovascular- S1, S2 normal, pulses palpable throughout   Lungs- chest clear, no wheezing, rales, normal symmetric air entry Abdomen- soft, non-tender; bowel sounds normal; no masses,  no organomegaly Extremities- no edema Lymph-no adenopathy palpable Musculoskeletal-no joint tenderness, deformity or swelling Skin-skin tenting  Neurological Examination   Mental Status: Alert, oriented to name and that in the hospital.  Thinks she is in [place].  Unable to provide much in the way of history.  Speech fluent without evidence of aphasia.  Able to follow  3 step commands with some reinforcement. Cranial Nerves: II: Discs flat bilaterally; Visual fields grossly normal, pupils equal, round, reactive to light and accommodation III,IV, VI: ptosis not present, extra-ocular motions intact bilaterally V,VII: smile symmetric, facial light touch sensation normal bilaterally VIII: hearing normal bilaterally IX,X: gag reflex present XI: bilateral shoulder shrug XII: midline tongue extension Motor: Patient able to lift all extremities above gravity with no focal weakness noticed.  Patient does have generalized weakness throughout. Sensory: Pinprick and light touch intact throughout, bilaterally Deep Tendon Reflexes: 2+ in the upper extremities and absent in the lower extremities Plantars: Right: upgoing   Left: upgoing Cerebellar: Normal finger-to-nose and normal heel-to-shin testing bilaterally Gait: not tested due to safety concerns   Laboratory Studies:  Basic Metabolic  Panel: Recent Labs  Lab 08/04/17 1616  NA 131*  K 3.6  CL 98*  CO2 24  GLUCOSE 110*  BUN 17  CREATININE 0.71  CALCIUM 8.4*    Liver Function Tests: Recent Labs  Lab 08/04/17 1616  AST 33  ALT 18  ALKPHOS 81  BILITOT 0.7  PROT 7.5  ALBUMIN 4.0   No results for input(s): LIPASE, AMYLASE in the last 168 hours. No results for input(s): AMMONIA in the last 168 hours.  CBC: Recent Labs  Lab 08/04/17 1616  WBC 13.4*  HGB 13.1  HCT 39.0  MCV 88.9  PLT 222    Cardiac Enzymes: Recent Labs  Lab 08/04/17 1616  TROPONINI <0.03    BNP: Invalid input(s): POCBNP  CBG: No results for input(s): GLUCAP in the last 168 hours.  Microbiology: No results found for this or any previous visit.  Coagulation Studies: No results for input(s): LABPROT, INR in the last 72 hours.  Urinalysis:  Recent Labs  Lab 08/04/17 1616  COLORURINE YELLOW*  LABSPEC 1.012  PHURINE 7.0  GLUCOSEU NEGATIVE  HGBUR NEGATIVE  BILIRUBINUR NEGATIVE  KETONESUR NEGATIVE  PROTEINUR NEGATIVE  NITRITE NEGATIVE  LEUKOCYTESUR NEGATIVE    Lipid Panel:    Component Value Date/Time   CHOL 169 08/05/2017 0312   TRIG 48 08/05/2017 0312   HDL 51 08/05/2017 0312   CHOLHDL 3.3 08/05/2017 0312   VLDL 10 08/05/2017 0312   LDLCALC 108 (H) 08/05/2017 0312    HgbA1C: No results found for: HGBA1C  Urine Drug Screen:  No results found for: LABOPIA, COCAINSCRNUR, LABBENZ, AMPHETMU, THCU, LABBARB  Alcohol Level: No results for input(s): ETH in the last 168 hours.  Other results: EKG: Sinus rhythm at 92 bpm.  Imaging: Dg Chest 2 View  Result Date: 08/04/2017 CLINICAL DATA:  Weakness EXAM: CHEST  2 VIEW COMPARISON:  06/12/2016 FINDINGS: Right Port-A-Cath remains in place, unchanged. There is hyperinflation of the lungs compatible with COPD. Heart is normal size. No confluent airspace opacities or effusions. No acute bony abnormality. IMPRESSION: COPD.  No active disease. Electronically Signed   By:  Rolm Baptise M.D.   On: 08/04/2017 20:11   Ct Head Wo Contrast  Result Date: 08/04/2017 CLINICAL DATA:  Altered level of consciousness, confusion EXAM: CT HEAD WITHOUT CONTRAST TECHNIQUE: Contiguous axial images were obtained from the base of the skull through the vertex without intravenous contrast. COMPARISON:  08/04/2014 FINDINGS: Brain: There is atrophy and chronic small vessel disease changes. Old right basal ganglia lacunar infarct. No acute intracranial abnormality. Specifically, no hemorrhage, hydrocephalus, mass lesion, acute infarction, or significant intracranial injury. Vascular: No hyperdense vessel or unexpected calcification. Skull: No acute calvarial abnormality. Sinuses/Orbits: Visualized paranasal sinuses and mastoids clear. Orbital soft  tissues unremarkable. Other: None IMPRESSION: No acute intracranial abnormality. Atrophy, chronic microvascular disease. Old right basal ganglia lacunar infarct. Electronically Signed   By: Rolm Baptise M.D.   On: 08/04/2017 19:58    Assessment: 84 y.o. female presenting with weakness and confusion.  From today's evaluation there is no focal weakness noted.  Head CT reviewed and shows an old right basal ganglion infarct but no acute changes.  MRI of the brain reviewed and shows evidence of small vessel ischemic changes but no acute infarct.  Suspect that patient may be dehydrated.  It seems that she is having some issues with her p.o. intake of fluids.  She has an underlying dementia.  With her old right basal ganglion infarct this may have exacerbated some left-sided findings.  Patient improved today.  Continues to have signs on physical exam though that suggest some continued dehydration.  Results of echocardiogram and carotid Dopplers are pending.  A1c pending.  LDL 108.  Stroke Risk Factors - atrial fibrillation, hyperlipidemia and hypertension  Plan: 1. PT consult, OT consult, Speech consult 2. Prophylactic therapy-would place pack patient on  aspirin a day at 325 mg for now.  It is unclear at this time with the patient is safe enough to consider anticoagulation therapy. 3. Telemetry monitoring 4. Frequent neuro checks 5. Statin for lipid management with target LDL<70.   Alexis Goodell, MD Neurology [phone] 08/05/2017, 12:54 PM

## 2017-08-05 NOTE — Telephone Encounter (Signed)
I made Dr. B aware.

## 2017-08-05 NOTE — Progress Notes (Signed)
*  PRELIMINARY RESULTS* Echocardiogram 2D Echocardiogram has been performed.  [name] 08/05/2017, 1:21 PM

## 2017-08-05 NOTE — Evaluation (Signed)
Physical Therapy Evaluation Patient Details Name: [name] MRN: [id number] DOB: 1924-06-29 Today's Date: 08/05/2017   History of Present Illness  84 y/o female here after AMS, weakness and unability to walk at home. Pt here to r/o CVA, MRI and CT (-).  Clinical Impression  Pt did well with mobility, getting to standing and walking ~70 ft, all essentially near her baseline.  She had short, choppy, slow ambulation and needed assist with directional awareness but ultimately did well regarding physical assessment.  Pt generally showed poor awareness t/o the session and this PT did try to encourage family to be realistic about her general safety at home, mostly with decision-making/safety awareness.  PT also encouraged them to minimize the amount of time pt is home alone, at least initially.     Follow Up Recommendations Home health PT(encouraged family to have near 24/7 supervision initially)    Equipment Recommendations       Recommendations for Other Services       Precautions / Restrictions Precautions Precautions: Fall Restrictions Weight Bearing Restrictions: No      Mobility  Bed Mobility Overal bed mobility: Modified Independent             General bed mobility comments: Pt uses rails, but was able to get to EOB w/o direct assist  Transfers Overall transfer level: Modified independent Equipment used: Rolling walker (2 wheeled)             General transfer comment: Pt able to rise to standing with minimal cuing, no LOBs or unsteadiness  Ambulation/Gait Ambulation/Gait assistance: Min guard Ambulation Distance (Feet): 70 Feet Assistive device: Rolling walker (2 wheeled)       General Gait Details: Pt with very short choppy steps and stooped posture, though this is apparenly baseline.  She had some fatigue with the effort but ultimately did well with directional cuing.  (patient only rarely walks more than this)  Stairs            Wheelchair  Mobility    Modified Rankin (Stroke Patients Only)       Balance Overall balance assessment: Modified Independent                                           Pertinent Vitals/Pain Pain Assessment: No/denies pain    Home Living Family/patient expects to be discharged to:: Private residence Living Arrangements: Alone Available Help at Discharge: Family(multiple family check-ins daily) Type of Home: House Home Access: Ramped entrance(has 2 + 1 steps on deck)       Home Equipment: Walker - 4 wheels      Prior Function Level of Independence: Independent with assistive device(s)         Comments: Family reports she only gets out of the house for MD appointments but is able to manage      Hand Dominance        Extremity/Trunk Assessment   Upper Extremity Assessment Upper Extremity Assessment: Generalized weakness(does not appear uneuqal bilaterally)    Lower Extremity Assessment Lower Extremity Assessment: Generalized weakness(does not appear unequal bilaterally)       Communication   Communication: (doesn't appear to fully follow conversation minimally verbal)  Cognition Arousal/Alertness: Awake/alert Behavior During Therapy: Flat affect Overall Cognitive Status: History of cognitive impairments - at baseline  General Comments: Pt appears more confused than family seems willing to admit, they do state that she is slightly more confused than normal      General Comments      Exercises     Assessment/Plan    PT Assessment Patient needs continued PT services  PT Problem List Decreased activity tolerance;Decreased strength;Decreased range of motion;Decreased balance;Decreased mobility;Decreased coordination;Decreased cognition;Decreased knowledge of use of DME;Decreased safety awareness;Decreased knowledge of precautions       PT Treatment Interventions DME instruction;Gait training;Stair  training;Functional mobility training;Therapeutic activities;Therapeutic exercise;Balance training;Cognitive remediation;Neuromuscular re-education;Patient/family education    PT Goals (Current goals can be found in the Care Plan section)  Acute Rehab PT Goals Patient Stated Goal: go home PT Goal Formulation: With patient/family Time For Goal Achievement: 08/19/17 Potential to Achieve Goals: Good    Frequency Min 2X/week   Barriers to discharge        Co-evaluation               AM-PAC PT "6 Clicks" Daily Activity  Outcome Measure Difficulty turning over in bed (including adjusting bedclothes, sheets and blankets)?: A Little Difficulty moving from lying on back to sitting on the side of the bed? : A Little Difficulty sitting down on and standing up from a chair with arms (e.g., wheelchair, bedside commode, etc,.)?: A Little Help needed moving to and from a bed to chair (including a wheelchair)?: A Little Help needed walking in hospital room?: A Little Help needed climbing 3-5 steps with a railing? : A Little 6 Click Score: 18    End of Session Equipment Utilized During Treatment: Gait belt Activity Tolerance: Patient limited by fatigue;Patient tolerated treatment well Patient left: with chair alarm set;with call bell/phone within reach;with nursing/sitter in room;with family/visitor present Nurse Communication: Mobility status PT Visit Diagnosis: Muscle weakness (generalized) (M62.81);Difficulty in walking, not elsewhere classified (R26.2)    Time: 2536-6440 PT Time Calculation (min) (ACUTE ONLY): 22 min   Charges:         PT G Codes:        Kreg Shropshire, DPT 08/05/2017, 4:28 PM

## 2017-08-05 NOTE — Care Management Obs Status (Signed)
[facility] NOTIFICATION   Patient Details  Name: [name] MRN: [id number] Date of Birth: 07-25-1924   Medicare Observation Status Notification Given:  Yes    Beverly Sessions, RN 08/05/2017, 3:23 PM

## 2017-08-05 NOTE — Progress Notes (Signed)
SLP Cancellation Note  Patient Details Name: [name] MRN: [id number] DOB: 08-01-1924   Cancelled treatment:       Reason Eval/Treat Not Completed: SLP screened, no needs identified, will sign off(chart reviewed; consulted NSG then met w/ pt and family). Pt denied any difficulty swallowing and is currently on a regular diet; just not much appetite and was "worried" her Son needed to eat lunch instead. She tolerates swallowing pills w/ puree and some of her breakfast this morning per NSG. Pt conversed during conversation w/out overt language deficits noted; pt does have baseline Dementia which can impact processing and verbal output. Son denied any significant speech deficits but noted min decreased volume in her speech over the past few months. As pt does not appear to be presenting w/ any Acute deficits new to this admission in her Cognitive-linguistic skills or in her swallowing, no further skilled ST services indicated at this time as pt appears at her baseline. Son and pt agreed. NSG to reconsult if any change in status. Recommended f/u w/ Dietician for support and education.    Orinda Kenner, MS, CCC-SLP Watson,Katherine 08/05/2017, 3:38 PM

## 2017-08-05 NOTE — Telephone Encounter (Signed)
[name] called to report that [name] is in the hospital and they aren't sure what is wrong, WBC up, left leg weakness, cannot walk. She just wanted you to be aware

## 2017-08-06 LAB — HEMOGLOBIN A1C
Hgb A1c MFr Bld: 5.3 % (ref 4.8–5.6)
Mean Plasma Glucose: 105 mg/dL

## 2017-08-06 MED ORDER — MEGESTROL ACETATE 40 MG PO TABS
40.0000 mg | ORAL_TABLET | Freq: Two times a day (BID) | ORAL | 0 refills | Status: DC
Start: 2017-08-06 — End: 2017-09-01

## 2017-08-06 MED ORDER — ASPIRIN EC 81 MG PO TBEC: 81.0000 mg | DELAYED_RELEASE_TABLET | Freq: Every day | ORAL | 1 refills | Status: DC

## 2017-08-06 NOTE — Progress Notes (Signed)
Pt for discharge home. Alert/ no distress. Some cognitive issues with recall.  Instructions to pts son and wife.  meds discussed. presc given and discussed, diet . Activity and f/u discussed.  Verbalizes understanding.   Will discharge home with son via car.

## 2017-08-06 NOTE — Care Management (Addendum)
Discharge to home with home health and physical therapy per Dr. Verdell Carmine. Discussed home health agencies with Ms. Nowotny.  States to call son, Wille Glaser. Telephone call to [facility]. States that they would like [place] RN MSN CCM Care Management [phone]

## 2017-08-06 NOTE — Plan of Care (Signed)
  Progressing Education: Knowledge of disease or condition will improve 08/06/2017 1322 - Progressing by Rowe Robert, RN Knowledge of secondary prevention will improve 08/06/2017 1322 - Progressing by Rowe Robert, RN Coping: Will verbalize positive feelings about self 08/06/2017 1322 - Progressing by Rowe Robert, RN Self-Care: Ability to participate in self-care as condition permits will improve 08/06/2017 1322 - Progressing by Rowe Robert, RN Nutrition: Risk of aspiration will decrease 08/06/2017 1322 - Progressing by Rowe Robert, RN Ischemic Stroke/TIA Tissue Perfusion: Complications of ischemic stroke/TIA will be minimized 08/06/2017 1322 - Progressing by Rowe Robert, RN Clinical Measurements: Ability to maintain clinical measurements within normal limits will improve 08/06/2017 1322 - Progressing by Rowe Robert, RN Safety: Ability to remain free from injury will improve 08/06/2017 1322 - Progressing by Rowe Robert, RN

## 2017-08-06 NOTE — Discharge Summary (Signed)
[name]    MR#:  [id number]  DATE OF BIRTH:  [birth year]  DATE OF ADMISSION:  08/04/2017 ADMITTING PHYSICIAN: Lance Coon, MD  DATE OF DISCHARGE: 08/06/2017  PRIMARY CARE PHYSICIAN: [place]    ADMISSION DIAGNOSIS:  Failure to thrive in adult [R62.7] Weakness of left lower extremity [R29.898] Altered mental status, unspecified altered mental status type [R41.82]  DISCHARGE DIAGNOSIS:  Principal Problem:   Left leg weakness Active Problems:   CAD in native artery   Benign essential HTN   Multiple myeloma not having achieved remission (HCC)   Paroxysmal atrial fibrillation ([place])   SECONDARY DIAGNOSIS:   Past Medical History:  Diagnosis Date  . Anxiety   . Arthritis   . Atrial fibrillation ([place])   . Coronary arteriosclerosis   . History of blood transfusion 2014  . Hyperlipidemia   . Hypertension   . Multiple myeloma ([place])   . Multiple myeloma in relapse ([place]) 10/29/2014  . 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  1. Generalized weakness/left lower extremity weakness-this was secondary to dehydration and deconditioning. -Patient did undergo a neurologic workup including MRI of the brain/MRA of the brain which was negative for any acute pathology. Patient was seen by neurology and given her history approximately atrial fibrillation did recommended starting the patient on baby aspirin. Patient cannot be on long-term anticoagulation given her advanced age, high fall risk. -Patient was seen by physical therapy and recommended home health services which was arranged for her prior to discharge.  2. Multiple myeloma-she is followed by oncology as  an outpatient. Prognosis is poor. Patient is followed by palliative services at home. Continue supportive care  3. Chronic pain-secondary to her underlying malignancy. Patient will continue fentanyl patch, oxycodone  4. Paroxysmal atrial fibrillation-this is rate controlled. Patient will continue some baby aspirin final coagulation.  DISCHARGE CONDITIONS:   Stable.   CONSULTS OBTAINED:  Treatment Team:  Alexis Goodell, MD  DRUG ALLERGIES:   Allergies  Allergen Reactions  . Biaxin [Clarithromycin] Diarrhea and Nausea And Vomiting  . Demerol [Meperidine] Nausea And Vomiting  . Hydrocodone Nausea And Vomiting  . Sulfa Antibiotics Hives  . Tramadol Diarrhea and Nausea And Vomiting  . Ultracet [Tramadol-Acetaminophen] Other (See Comments)    DISCHARGE MEDICATIONS:   Allergies as of 08/06/2017      Reactions   Biaxin [clarithromycin] Diarrhea, Nausea And Vomiting   Demerol [meperidine] Nausea And Vomiting   Hydrocodone Nausea And Vomiting   Sulfa Antibiotics Hives   Tramadol Diarrhea, Nausea And Vomiting   Ultracet [tramadol-acetaminophen] Other (See Comments)      Medication List    STOP taking these medications   montelukast 10 MG tablet Commonly known as:  SINGULAIR   prochlorperazine 10 MG tablet Commonly known as:  COMPAZINE     TAKE these medications   acyclovir 400 MG tablet Commonly known as:  ZOVIRAX TAKE 1 TABLET BY MOUTH ONCE DAILY TO  PREVENT  SHINGLES What changed:    how much to take  how to take this  when to take this  additional instructions   aspirin EC 81 MG tablet Take 1 tablet (81 mg total) by mouth daily.   COLACE PO Take 2 tablets by mouth  at bedtime.   dexamethasone 4 MG tablet Commonly known as:  DECADRON Take 1 tablet (4 mg total) by mouth 2 (two) times daily. Start 2 days prior to infusion; Take it for 4 days.   fentaNYL 25 MCG/HR patch Commonly known as:  DURAGESIC - dosed mcg/hr Place 1 patch (25 mcg total) onto the  skin every 3 (three) days.   lidocaine-prilocaine cream Commonly known as:  EMLA Apply 1 application topically as needed. Apply small amount to port site at least 1 hour prior to it being accessed, cover with plastic wrap   mirtazapine 30 MG tablet Commonly known as:  REMERON TAKE 1 TABLET BY MOUTH AT BEDTIME   ondansetron 4 MG tablet Commonly known as:  ZOFRAN Take 1 tablet (4 mg total) by mouth every 8 (eight) hours as needed for nausea or vomiting.   oxyCODONE 5 MG immediate release tablet Commonly known as:  Oxy IR/ROXICODONE Take 0.5 tablets (2.5 mg total) by mouth every 8 (eight) hours as needed for severe pain.   polyethylene glycol packet Commonly known as:  MIRALAX / GLYCOLAX Take 17 g by mouth at bedtime as needed for mild constipation.   risperiDONE 1 MG tablet Commonly known as:  RISPERDAL Take 1 tablet (1 mg total) by mouth at bedtime.         DISCHARGE INSTRUCTIONS:   DIET:  Regular diet  DISCHARGE CONDITION:  Stable  ACTIVITY:  Activity as tolerated  OXYGEN:  Home Oxygen: No.   Oxygen Delivery: room air  DISCHARGE LOCATION:  Home with Home Health PT, RN.    If you experience worsening of your admission symptoms, develop shortness of breath, life threatening emergency, suicidal or homicidal thoughts you must seek medical attention immediately by calling 911 or calling your MD immediately  if symptoms less severe.  You Must read complete instructions/literature along with all the possible adverse reactions/side effects for all the Medicines you take and that have been prescribed to you. Take any new Medicines after you have completely understood and accpet all the possible adverse reactions/side effects.   Please note  You were cared for by a hospitalist during your hospital stay. If you have any questions about your discharge medications or the care you received while you were in the hospital after you are discharged, you can call the unit and  asked to speak with the hospitalist on call if the hospitalist that took care of you is not available. Once you are discharged, your primary care physician will handle any further medical issues. Please note that NO REFILLS for any discharge medications will be authorized once you are discharged, as it is imperative that you return to your primary care physician (or establish a relationship with a primary care physician if you do not have one) for your aftercare needs so that they can reassess your need for medications and monitor your lab values.     Today   No acute events overnight.  Feels fine. Will d/c home with Home Health today.   VITAL SIGNS:  Blood pressure (!) 182/70, pulse 94, temperature 98.4 F (36.9 C), resp. rate 18, height '5\' 2"'  (1.575 m), weight 46.2 kg (101 lb 14.4 oz), SpO2 98 %.  I/O:    Intake/Output Summary (Last 24 hours) at 08/06/2017 1223 Last data filed at 08/06/2017 1130 Gross per 24 hour  Intake 580 ml  Output 400 ml  Net 180 ml    PHYSICAL EXAMINATION:  GENERAL:  85 y.o.-year-old patient lying in the  bed in no acute distress.  EYES: Pupils equal, round, reactive to light and accommodation. No scleral icterus. Extraocular muscles intact.  HEENT: Head atraumatic, normocephalic. Oropharynx and nasopharynx clear.  NECK:  Supple, no jugular venous distention. No thyroid enlargement, no tenderness.  LUNGS: Normal breath sounds bilaterally, no wheezing, rales,rhonchi. No use of accessory muscles of respiration.  CARDIOVASCULAR: S1, S2 normal. No murmurs, rubs, or gallops.  ABDOMEN: Soft, non-tender, non-distended. Bowel sounds present. No organomegaly or mass.  EXTREMITIES: No pedal edema, cyanosis, or clubbing.  NEUROLOGIC: Cranial nerves II through XII are intact. No focal motor or sensory defecits b/l. Globally weak.   PSYCHIATRIC: The patient is alert and oriented x 3.  SKIN: No obvious rash, lesion, or ulcer.   DATA REVIEW:   CBC Recent Labs  Lab  08/04/17 1616  WBC 13.4*  HGB 13.1  HCT 39.0  PLT 222    Chemistries  Recent Labs  Lab 08/04/17 1616  NA 131*  K 3.6  CL 98*  CO2 24  GLUCOSE 110*  BUN 17  CREATININE 0.71  CALCIUM 8.4*  AST 33  ALT 18  ALKPHOS 81  BILITOT 0.7    Cardiac Enzymes Recent Labs  Lab 08/04/17 1616  TROPONINI <0.03    Microbiology Results  No results found for this or any previous visit.  RADIOLOGY:  Dg Chest 2 View  Result Date: 08/04/2017 CLINICAL DATA:  Weakness EXAM: CHEST  2 VIEW COMPARISON:  06/12/2016 FINDINGS: Right Port-A-Cath remains in place, unchanged. There is hyperinflation of the lungs compatible with COPD. Heart is normal size. No confluent airspace opacities or effusions. No acute bony abnormality. IMPRESSION: COPD.  No active disease. Electronically Signed   By: Rolm Baptise M.D.   On: 08/04/2017 20:11   Ct Head Wo Contrast  Result Date: 08/04/2017 CLINICAL DATA:  Altered level of consciousness, confusion EXAM: CT HEAD WITHOUT CONTRAST TECHNIQUE: Contiguous axial images were obtained from the base of the skull through the vertex without intravenous contrast. COMPARISON:  08/04/2014 FINDINGS: Brain: There is atrophy and chronic small vessel disease changes. Old right basal ganglia lacunar infarct. No acute intracranial abnormality. Specifically, no hemorrhage, hydrocephalus, mass lesion, acute infarction, or significant intracranial injury. Vascular: No hyperdense vessel or unexpected calcification. Skull: No acute calvarial abnormality. Sinuses/Orbits: Visualized paranasal sinuses and mastoids clear. Orbital soft tissues unremarkable. Other: None IMPRESSION: No acute intracranial abnormality. Atrophy, chronic microvascular disease. Old right basal ganglia lacunar infarct. Electronically Signed   By: Rolm Baptise M.D.   On: 08/04/2017 19:58   Mr Brain Wo Contrast  Result Date: 08/05/2017 CLINICAL DATA:  84 year old female with left lower extremity weakness and confusion for  24 hours. EXAM: MRI HEAD WITHOUT CONTRAST MRA HEAD WITHOUT CONTRAST TECHNIQUE: Multiplanar, multiecho pulse sequences of the brain and surrounding structures were obtained without intravenous contrast. Angiographic images of the head were obtained using MRA technique without contrast. COMPARISON:  Head CT without contrast 08/04/2017 and earlier. Brain MRI 07/21/2014. FINDINGS: MRI HEAD FINDINGS Brain: Cerebral volume is stable since 2016. No restricted diffusion or evidence of acute infarction. A chronic lacunar infarct in the central right thalamus is new since 2016. Stable T2 heterogeneity elsewhere throughout the deep gray matter nuclei which appears mostly due to perivascular spaces. Mild for age T2 heterogeneity in the pons is stable since 2016. Scattered Patchy and confluent bilateral cerebral white matter T2 and FLAIR hyperintensity has not significantly changed since 2016. No cortical encephalomalacia or chronic cerebral blood products identified. No midline shift, mass effect,  evidence of mass lesion, ventriculomegaly, extra-axial collection or acute intracranial hemorrhage. Cervicomedullary junction and pituitary are within normal limits. Vascular: Major intracranial vascular flow voids are stable since 2016. Skull and upper cervical spine: Negative for age visible cervical spine. Visualized bone marrow signal is within normal limits. Sinuses/Orbits: Stable and negative. Other: Visible internal auditory structures appear normal. Mastoid air cells remain clear. Scalp and face soft tissues appear negative. MRA HEAD FINDINGS Antegrade flow in the posterior circulation with no distal vertebral artery stenosis. Mildly dominant left vertebral artery. Normal left PICA origin. Normal vertebrobasilar junction. No basilar stenosis. AICA and SCA origins appear patent. Bilateral PCA origins are within normal limits. Posterior communicating arteries are diminutive or absent. There is bilateral moderate to severe distal  P1/P2 segment PCA stenoses (series 107). MIP images also suggest severe distal right P2 stenosis, although on source images the other right PCA branches appear within normal limits. Antegrade flow in both ICA siphons. No siphon stenosis. Normal ophthalmic artery origins. Patent carotid termini. Normal MCA and ACA origins. The anterior communicating artery is diminutive or absent. There is mild to moderate stenosis of the right ACA A2 segment (series 105, image 8), but preserved distal flow. The left MCA M1 segment is patent without stenosis. The left MCA bifurcation is patent. Questionable moderate to severe stenosis in the dominant posterior left MCA branch (might be artifact). Otherwise the visible left MCA branches appear within normal limits. The right MCA M1 segment is patent with an early bifurcation. The right MCA bifurcation is patent. The major right MCA scratched at the visible right MCA branches are patent. There is mild to moderate stenosis in the proximal aspect of the dominant right MCA M2 branch (series 105, image 12). IMPRESSION: 1. No acute infarct or acute intracranial abnormality is identified. 2. No intracranial large vessel occlusion, but intracranial MRA is remarkable for widespread moderate and severe 2nd order circle-of-Willis branch atherosclerosis and stenosis, including moderate stenosis of the dominant right MCA posterior M2 branch. 3. Sequelae of chronic small vessel disease in the brain is only mildly progressed since 2016. Electronically Signed   By: Genevie Ann M.D.   On: 08/05/2017 13:00   US Carotid Bilateral (at [facility] Only)  Result Date: 08/05/2017 CLINICAL DATA:  84 year old female with a history of left leg weakness. Cardiovascular risk factors include hypertension EXAM: BILATERAL CAROTID DUPLEX ULTRASOUND TECHNIQUE: Pearline Cables scale imaging, color Doppler and duplex ultrasound were performed of bilateral carotid and vertebral arteries in the neck. COMPARISON:  No prior duplex  FINDINGS: Criteria: Quantification of carotid stenosis is based on velocity parameters that correlate the residual internal carotid diameter with NASCET-based stenosis levels, using the diameter of the distal internal carotid lumen as the denominator for stenosis measurement. The following velocity measurements were obtained: RIGHT ICA:  Systolic 86 cm/sec, Diastolic 13 cm/sec CCA:  95 cm/sec SYSTOLIC ICA/CCA RATIO:  0.9 ECA:  114 cm/sec LEFT ICA:  Systolic 93 cm/sec, Diastolic 21 cm/sec CCA:  191 cm/sec SYSTOLIC ICA/CCA RATIO:  0.9 ECA:  107 cm/sec Right Brachial SBP: Not acquired Left Brachial SBP: Not acquired RIGHT CAROTID ARTERY: No significant calcifications of the right common carotid artery. Intermediate waveform maintained. Heterogeneous and partially calcified plaque at the right carotid bifurcation. No significant lumen shadowing. Low resistance waveform of the right ICA. No significant tortuosity. RIGHT VERTEBRAL ARTERY: Antegrade flow with low resistance waveform. LEFT CAROTID ARTERY: No significant calcifications of the left common carotid artery. Intermediate waveform maintained. Heterogeneous and partially calcified plaque at  the left carotid bifurcation without significant lumen shadowing. Low resistance waveform of the left ICA. No significant tortuosity. LEFT VERTEBRAL ARTERY:  Antegrade flow with low resistance waveform. IMPRESSION: Color duplex indicates minimal heterogeneous and calcified plaque, with no hemodynamically significant stenosis by duplex criteria in the extracranial cerebrovascular circulation. Signed, Dulcy Fanny. Earleen Newport, DO [facility] Electronically Signed   By: Corrie Mckusick D.O.   On: 08/05/2017 13:27   Mr Jodene Nam Head/brain ZO Cm  Result Date: 08/05/2017 CLINICAL DATA:  84 year old female with left lower extremity weakness and confusion for 24 hours. EXAM: MRI HEAD WITHOUT CONTRAST MRA HEAD WITHOUT CONTRAST TECHNIQUE:  Multiplanar, multiecho pulse sequences of the brain and surrounding structures were obtained without intravenous contrast. Angiographic images of the head were obtained using MRA technique without contrast. COMPARISON:  Head CT without contrast 08/04/2017 and earlier. Brain MRI 07/21/2014. FINDINGS: MRI HEAD FINDINGS Brain: Cerebral volume is stable since 2016. No restricted diffusion or evidence of acute infarction. A chronic lacunar infarct in the central right thalamus is new since 2016. Stable T2 heterogeneity elsewhere throughout the deep gray matter nuclei which appears mostly due to perivascular spaces. Mild for age T2 heterogeneity in the pons is stable since 2016. Scattered Patchy and confluent bilateral cerebral white matter T2 and FLAIR hyperintensity has not significantly changed since 2016. No cortical encephalomalacia or chronic cerebral blood products identified. No midline shift, mass effect, evidence of mass lesion, ventriculomegaly, extra-axial collection or acute intracranial hemorrhage. Cervicomedullary junction and pituitary are within normal limits. Vascular: Major intracranial vascular flow voids are stable since 2016. Skull and upper cervical spine: Negative for age visible cervical spine. Visualized bone marrow signal is within normal limits. Sinuses/Orbits: Stable and negative. Other: Visible internal auditory structures appear normal. Mastoid air cells remain clear. Scalp and face soft tissues appear negative. MRA HEAD FINDINGS Antegrade flow in the posterior circulation with no distal vertebral artery stenosis. Mildly dominant left vertebral artery. Normal left PICA origin. Normal vertebrobasilar junction. No basilar stenosis. AICA and SCA origins appear patent. Bilateral PCA origins are within normal limits. Posterior communicating arteries are diminutive or absent. There is bilateral moderate to severe distal P1/P2 segment PCA stenoses (series 107). MIP images also suggest severe distal  right P2 stenosis, although on source images the other right PCA branches appear within normal limits. Antegrade flow in both ICA siphons. No siphon stenosis. Normal ophthalmic artery origins. Patent carotid termini. Normal MCA and ACA origins. The anterior communicating artery is diminutive or absent. There is mild to moderate stenosis of the right ACA A2 segment (series 105, image 8), but preserved distal flow. The left MCA M1 segment is patent without stenosis. The left MCA bifurcation is patent. Questionable moderate to severe stenosis in the dominant posterior left MCA branch (might be artifact). Otherwise the visible left MCA branches appear within normal limits. The right MCA M1 segment is patent with an early bifurcation. The right MCA bifurcation is patent. The major right MCA scratched at the visible right MCA branches are patent. There is mild to moderate stenosis in the proximal aspect of the dominant right MCA M2 branch (series 105, image 12). IMPRESSION: 1. No acute infarct or acute intracranial abnormality is identified. 2. No intracranial large vessel occlusion, but intracranial MRA is remarkable for widespread moderate and severe 2nd order circle-of-Willis branch atherosclerosis and stenosis, including moderate stenosis of the dominant right MCA posterior M2 branch. 3. Sequelae of chronic small vessel disease in the brain is only mildly  progressed since 2016. Electronically Signed   By: Genevie Ann M.D.   On: 08/05/2017 13:00      Management plans discussed with the patient, family and they are in agreement.  CODE STATUS:     Code Status Orders  (From admission, onward)        Start     Ordered   08/05/17 1300  Do not attempt resuscitation (DNR)  Continuous    Question Answer Comment  In the event of cardiac or respiratory ARREST Do not call a "code blue"   In the event of cardiac or respiratory ARREST Do not perform Intubation, CPR, defibrillation or ACLS   In the event of cardiac  or respiratory ARREST Use medication by any route, position, wound care, and other measures to relive pain and suffering. May use oxygen, suction and manual treatment of airway obstruction as needed for comfort.      08/05/17 1300        TOTAL TIME TAKING CARE OF THIS PATIENT: 40 minutes.    Henreitta Leber M.D on 08/06/2017 at 12:23 PM  Between 7am to 6pm - Pager - [phone]  After 6pm go to [email or web address] - [employer] [facility]  Office  [phone]  CC: Primary care physician; [facility]

## 2017-08-07 ENCOUNTER — Emergency Department: Payer: Medicare Other

## 2017-08-07 ENCOUNTER — Inpatient Hospital Stay
Admission: EM | Admit: 2017-08-07 | Discharge: 2017-08-11 | DRG: 871 | Disposition: A | Discharge status: Skilled Nursing Facility | Payer: Medicare Other | Attending: Internal Medicine | Admitting: Internal Medicine

## 2017-08-07 ENCOUNTER — Other Ambulatory Visit: Payer: Self-pay

## 2017-08-07 ENCOUNTER — Encounter: Payer: Self-pay | Admitting: Emergency Medicine

## 2017-08-07 DIAGNOSIS — Z79891 Long term (current) use of opiate analgesic: Secondary | ICD-10-CM

## 2017-08-07 DIAGNOSIS — I10 Essential (primary) hypertension: Secondary | ICD-10-CM | POA: Diagnosis present

## 2017-08-07 DIAGNOSIS — J440 Chronic obstructive pulmonary disease with acute lower respiratory infection: Secondary | ICD-10-CM | POA: Diagnosis present

## 2017-08-07 DIAGNOSIS — E785 Hyperlipidemia, unspecified: Secondary | ICD-10-CM | POA: Diagnosis present

## 2017-08-07 DIAGNOSIS — E860 Dehydration: Secondary | ICD-10-CM | POA: Diagnosis present

## 2017-08-07 DIAGNOSIS — F0280 Dementia in other diseases classified elsewhere without behavioral disturbance: Secondary | ICD-10-CM | POA: Diagnosis present

## 2017-08-07 DIAGNOSIS — C9002 Multiple myeloma in relapse: Secondary | ICD-10-CM | POA: Diagnosis present

## 2017-08-07 DIAGNOSIS — M069 Rheumatoid arthritis, unspecified: Secondary | ICD-10-CM | POA: Diagnosis present

## 2017-08-07 DIAGNOSIS — G9341 Metabolic encephalopathy: Secondary | ICD-10-CM | POA: Diagnosis present

## 2017-08-07 DIAGNOSIS — Z882 Allergy status to sulfonamides status: Secondary | ICD-10-CM | POA: Diagnosis not present

## 2017-08-07 DIAGNOSIS — Z7982 Long term (current) use of aspirin: Secondary | ICD-10-CM | POA: Diagnosis not present

## 2017-08-07 DIAGNOSIS — Z66 Do not resuscitate: Secondary | ICD-10-CM | POA: Diagnosis present

## 2017-08-07 DIAGNOSIS — Y95 Nosocomial condition: Secondary | ICD-10-CM | POA: Diagnosis present

## 2017-08-07 DIAGNOSIS — Z8249 Family history of ischemic heart disease and other diseases of the circulatory system: Secondary | ICD-10-CM | POA: Diagnosis not present

## 2017-08-07 DIAGNOSIS — Z881 Allergy status to other antibiotic agents status: Secondary | ICD-10-CM

## 2017-08-07 DIAGNOSIS — Z90710 Acquired absence of both cervix and uterus: Secondary | ICD-10-CM | POA: Diagnosis not present

## 2017-08-07 DIAGNOSIS — Z885 Allergy status to narcotic agent status: Secondary | ICD-10-CM | POA: Diagnosis not present

## 2017-08-07 DIAGNOSIS — Z809 Family history of malignant neoplasm, unspecified: Secondary | ICD-10-CM

## 2017-08-07 DIAGNOSIS — I2510 Atherosclerotic heart disease of native coronary artery without angina pectoris: Secondary | ICD-10-CM | POA: Diagnosis present

## 2017-08-07 DIAGNOSIS — I4891 Unspecified atrial fibrillation: Secondary | ICD-10-CM | POA: Diagnosis present

## 2017-08-07 DIAGNOSIS — R531 Weakness: Secondary | ICD-10-CM

## 2017-08-07 DIAGNOSIS — G8929 Other chronic pain: Secondary | ICD-10-CM | POA: Diagnosis present

## 2017-08-07 DIAGNOSIS — C9000 Multiple myeloma not having achieved remission: Secondary | ICD-10-CM

## 2017-08-07 DIAGNOSIS — A419 Sepsis, unspecified organism: Principal | ICD-10-CM

## 2017-08-07 DIAGNOSIS — M1990 Unspecified osteoarthritis, unspecified site: Secondary | ICD-10-CM | POA: Diagnosis present

## 2017-08-07 DIAGNOSIS — G893 Neoplasm related pain (acute) (chronic): Secondary | ICD-10-CM

## 2017-08-07 DIAGNOSIS — G309 Alzheimer's disease, unspecified: Secondary | ICD-10-CM | POA: Diagnosis present

## 2017-08-07 DIAGNOSIS — J189 Pneumonia, unspecified organism: Secondary | ICD-10-CM

## 2017-08-07 LAB — COMPREHENSIVE METABOLIC PANEL
ALT: 58 U/L — AB (ref 14–54)
AST: 71 U/L — ABNORMAL HIGH (ref 15–41)
Albumin: 3.1 g/dL — ABNORMAL LOW (ref 3.5–5.0)
Alkaline Phosphatase: 153 U/L — ABNORMAL HIGH (ref 38–126)
Anion gap: 9 (ref 5–15)
BILIRUBIN TOTAL: 0.6 mg/dL (ref 0.3–1.2)
BUN: 11 mg/dL (ref 6–20)
CHLORIDE: 106 mmol/L (ref 101–111)
CO2: 20 mmol/L — ABNORMAL LOW (ref 22–32)
CREATININE: 0.59 mg/dL (ref 0.44–1.00)
Calcium: 8 mg/dL — ABNORMAL LOW (ref 8.9–10.3)
Glucose, Bld: 118 mg/dL — ABNORMAL HIGH (ref 65–99)
Potassium: 3.4 mmol/L — ABNORMAL LOW (ref 3.5–5.1)
Sodium: 135 mmol/L (ref 135–145)
TOTAL PROTEIN: 6.8 g/dL (ref 6.5–8.1)

## 2017-08-07 LAB — CBC WITH DIFFERENTIAL/PLATELET
BASOS ABS: 0 10*3/uL (ref 0–0.1)
BASOS PCT: 0 %
EOS ABS: 0 10*3/uL (ref 0–0.7)
EOS PCT: 0 %
HCT: 38 % (ref 35.0–47.0)
Hemoglobin: 12.5 g/dL (ref 12.0–16.0)
LYMPHS PCT: 9 %
Lymphs Abs: 0.6 10*3/uL — ABNORMAL LOW (ref 1.0–3.6)
MCH: 29.3 pg (ref 26.0–34.0)
MCHC: 32.9 g/dL (ref 32.0–36.0)
MCV: 89 fL (ref 80.0–100.0)
MONO ABS: 0.8 10*3/uL (ref 0.2–0.9)
Monocytes Relative: 13 %
Neutro Abs: 4.9 10*3/uL (ref 1.4–6.5)
Neutrophils Relative %: 78 %
PLATELETS: 213 10*3/uL (ref 150–440)
RBC: 4.26 MIL/uL (ref 3.80–5.20)
RDW: 15.1 % — AB (ref 11.5–14.5)
WBC: 6.3 10*3/uL (ref 3.6–11.0)

## 2017-08-07 LAB — URINALYSIS, COMPLETE (UACMP) WITH MICROSCOPIC
BILIRUBIN URINE: NEGATIVE
GLUCOSE, UA: NEGATIVE mg/dL
Ketones, ur: NEGATIVE mg/dL
Leukocytes, UA: NEGATIVE
NITRITE: NEGATIVE
PH: 7 (ref 5.0–8.0)
Protein, ur: NEGATIVE mg/dL
SPECIFIC GRAVITY, URINE: 1.009 (ref 1.005–1.030)

## 2017-08-07 LAB — INFLUENZA PANEL BY PCR (TYPE A & B)
INFLAPCR: NEGATIVE
INFLBPCR: NEGATIVE

## 2017-08-07 LAB — LACTIC ACID, PLASMA: LACTIC ACID, VENOUS: 1.9 mmol/L (ref 0.5–1.9)

## 2017-08-07 LAB — TROPONIN I

## 2017-08-07 LAB — MRSA PCR SCREENING: MRSA BY PCR: NEGATIVE

## 2017-08-07 MED ORDER — LIDOCAINE-PRILOCAINE 2.5-2.5 % EX CREA
1.0000 "application " | TOPICAL_CREAM | CUTANEOUS | Status: DC | PRN
  Filled 2017-08-07: qty 5

## 2017-08-07 MED ORDER — ONDANSETRON HCL 4 MG PO TABS: 4.0000 mg | ORAL_TABLET | Freq: Four times a day (QID) | ORAL | Status: DC | PRN

## 2017-08-07 MED ORDER — TRAZODONE HCL 50 MG PO TABS
25.0000 mg | ORAL_TABLET | Freq: Every evening | ORAL | Status: DC | PRN
  Administered 2017-08-10: 25 mg via ORAL
  Filled 2017-08-07: qty 1

## 2017-08-07 MED ORDER — ACYCLOVIR 400 MG PO TABS
400.0000 mg | ORAL_TABLET | Freq: Every day | ORAL | Status: DC
  Filled 2017-08-07: qty 1

## 2017-08-07 MED ORDER — HYDROCODONE-ACETAMINOPHEN 5-325 MG PO TABS
1.0000 | ORAL_TABLET | ORAL | Status: DC | PRN
  Administered 2017-08-11: 2 via ORAL
  Filled 2017-08-07: qty 2

## 2017-08-07 MED ORDER — BISACODYL 5 MG PO TBEC: 5.0000 mg | DELAYED_RELEASE_TABLET | Freq: Every day | ORAL | Status: DC | PRN

## 2017-08-07 MED ORDER — POLYETHYLENE GLYCOL 3350 17 G PO PACK: 17.0000 g | PACK | Freq: Every evening | ORAL | Status: DC | PRN

## 2017-08-07 MED ORDER — SODIUM CHLORIDE 0.9 % IV SOLN
INTRAVENOUS | Status: DC
  Administered 2017-08-07 – 2017-08-09 (×3): via INTRAVENOUS

## 2017-08-07 MED ORDER — CEFEPIME HCL 2 G IJ SOLR
2.0000 g | Freq: Once | INTRAMUSCULAR | Status: AC
  Administered 2017-08-07: 2 g via INTRAVENOUS
  Filled 2017-08-07: qty 2

## 2017-08-07 MED ORDER — DIVALPROEX SODIUM 125 MG PO DR TAB
125.0000 mg | DELAYED_RELEASE_TABLET | Freq: Every day | ORAL | Status: DC
  Administered 2017-08-08 – 2017-08-11 (×4): 125 mg via ORAL
  Filled 2017-08-07 (×4): qty 1

## 2017-08-07 MED ORDER — FENTANYL 25 MCG/HR TD PT72
25.0000 ug | MEDICATED_PATCH | TRANSDERMAL | Status: DC
  Administered 2017-08-07 – 2017-08-10 (×2): 25 ug via TRANSDERMAL
  Filled 2017-08-07 (×2): qty 1

## 2017-08-07 MED ORDER — MEGESTROL ACETATE 20 MG PO TABS
40.0000 mg | ORAL_TABLET | Freq: Two times a day (BID) | ORAL | Status: DC
  Administered 2017-08-08 – 2017-08-11 (×7): 40 mg via ORAL
  Filled 2017-08-07 (×9): qty 1

## 2017-08-07 MED ORDER — SODIUM CHLORIDE 0.9 % IV SOLN
2.0000 g | Freq: Two times a day (BID) | INTRAVENOUS | Status: DC
  Administered 2017-08-08 – 2017-08-11 (×8): 2 g via INTRAVENOUS
  Filled 2017-08-07 (×15): qty 2

## 2017-08-07 MED ORDER — VANCOMYCIN HCL IN DEXTROSE 1-5 GM/200ML-% IV SOLN
1000.0000 mg | Freq: Once | INTRAVENOUS | Status: AC
  Administered 2017-08-07: 1000 mg via INTRAVENOUS
  Filled 2017-08-07: qty 200

## 2017-08-07 MED ORDER — ASPIRIN EC 81 MG PO TBEC
81.0000 mg | DELAYED_RELEASE_TABLET | Freq: Every day | ORAL | Status: DC
  Administered 2017-08-08 – 2017-08-11 (×4): 81 mg via ORAL
  Filled 2017-08-07 (×4): qty 1

## 2017-08-07 MED ORDER — ACYCLOVIR 200 MG PO CAPS
400.0000 mg | ORAL_CAPSULE | Freq: Every day | ORAL | Status: DC
  Administered 2017-08-07 – 2017-08-10 (×4): 400 mg via ORAL
  Filled 2017-08-07 (×5): qty 2

## 2017-08-07 MED ORDER — HEPARIN SODIUM (PORCINE) 5000 UNIT/ML IJ SOLN
5000.0000 [IU] | Freq: Three times a day (TID) | INTRAMUSCULAR | Status: DC
  Administered 2017-08-08 – 2017-08-11 (×10): 5000 [IU] via SUBCUTANEOUS
  Filled 2017-08-07 (×10): qty 1

## 2017-08-07 MED ORDER — ACETAMINOPHEN 325 MG PO TABS
650.0000 mg | ORAL_TABLET | Freq: Four times a day (QID) | ORAL | Status: DC | PRN
  Administered 2017-08-10: 08:00:00 650 mg via ORAL
  Filled 2017-08-07: qty 2

## 2017-08-07 MED ORDER — ONDANSETRON HCL 4 MG/2ML IJ SOLN: 4.0000 mg | Freq: Four times a day (QID) | INTRAMUSCULAR | Status: DC | PRN

## 2017-08-07 MED ORDER — VANCOMYCIN HCL 10 G IV SOLR
1250.0000 mg | INTRAVENOUS | Status: DC
  Administered 2017-08-08: 1250 mg via INTRAVENOUS
  Filled 2017-08-07 (×3): qty 1250

## 2017-08-07 MED ORDER — MIRTAZAPINE 15 MG PO TABS
30.0000 mg | ORAL_TABLET | Freq: Every day | ORAL | Status: DC
  Administered 2017-08-07 – 2017-08-10 (×4): 30 mg via ORAL
  Filled 2017-08-07 (×4): qty 2

## 2017-08-07 MED ORDER — SODIUM CHLORIDE 0.9 % IV SOLN
Freq: Once | INTRAVENOUS | Status: AC
  Administered 2017-08-07: 21:00:00 via INTRAVENOUS

## 2017-08-07 MED ORDER — DOCUSATE SODIUM 100 MG PO CAPS
100.0000 mg | ORAL_CAPSULE | Freq: Two times a day (BID) | ORAL | Status: DC
  Administered 2017-08-08 – 2017-08-11 (×7): 100 mg via ORAL
  Filled 2017-08-07 (×7): qty 1

## 2017-08-07 MED ORDER — ACETAMINOPHEN 650 MG RE SUPP: 650.0000 mg | Freq: Four times a day (QID) | RECTAL | Status: DC | PRN

## 2017-08-07 MED ORDER — RISPERIDONE 1 MG PO TABS
1.0000 mg | ORAL_TABLET | Freq: Every day | ORAL | Status: DC
  Administered 2017-08-07 – 2017-08-10 (×4): 1 mg via ORAL
  Filled 2017-08-07 (×5): qty 1

## 2017-08-07 NOTE — ED Triage Notes (Signed)
Pt to ED via EMS from home c/o weakness and AMS that started today.  Patient recently admitted 3 days ago for dehydration and was released without issue.  Patient has hx of new dementia and multiple myeloma per EMS.  EMS vitals 102.4 temp, 96% RA, 112/62 BP, 298 CBG, 20 etCO2, 110 HR.  Pt given 500cc bolus normal saline en route.  Has DNR paperwork at bedside.  Pt alert and oriented to self and place, unable to get much information from patient.

## 2017-08-07 NOTE — H&P (Signed)
[place] [facility] [place] NAME: [name]    MR#:  [id number]  DATE OF BIRTH:  [birth year]  DATE OF ADMISSION:  08/07/2017  PRIMARY CARE PHYSICIAN: Shari Prows, [facility]   REQUESTING/REFERRING PHYSICIAN:   CHIEF COMPLAINT:   Chief Complaint  Patient presents with  . Altered Mental Status    HISTORY OF PRESENT ILLNESS: [name]  is a 84 y.o. female with a known history of dementia, multiple myeloma, atrial fibrillation, hypertension. Patient was brought to emergency room for confusion and fever.  She is not able to provide any history, due to altered mental status.  Information was taken from reviewing the medical records and from the discussion with the emergency room doctor and EMS records.  The arrival to emergency room her temperature was elevated at 102.4; her oxygen saturation was 96% on room air and heart rate was elevated in 120s.  WBC is within normal limits at 6.3. She was recently hospitalized for weakness, just few days ago. Chest x-ray reviewed by myself shows right middle and lower lobe infiltrates.  EKG is noted without any acute changes. Patient is admitted for further evaluation and treatment.  PAST MEDICAL HISTORY:   Past Medical History:  Diagnosis Date  . Anxiety   . Arthritis   . Atrial fibrillation ([place])   . Coronary arteriosclerosis   . History of blood transfusion 2014  . Hyperlipidemia   . Hypertension   . Multiple myeloma ([place])   . Multiple myeloma in relapse ([place]) 10/29/2014  . 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 ABDOMINAL HYSTERECTOMY    . APPENDECTOMY    . PARTIAL COLECTOMY    . TOTAL KNEE REVISION Left     SOCIAL HISTORY:  Social History   Tobacco Use  . Smoking status: Never Smoker  . Smokeless tobacco: Never Used  Substance Use Topics  . Alcohol use: Yes    Comment: occasional    FAMILY  HISTORY:  Family History  Problem Relation Age of Onset  . Cancer Father   . Heart attack Mother     DRUG ALLERGIES:  Allergies  Allergen Reactions  . Biaxin [Clarithromycin] Diarrhea and Nausea And Vomiting  . Demerol [Meperidine] Nausea And Vomiting  . Hydrocodone Nausea And Vomiting  . Sulfa Antibiotics Hives  . Tramadol Diarrhea and Nausea And Vomiting  . Ultracet [Tramadol-Acetaminophen] Other (See Comments)    REVIEW OF SYSTEMS:   Not able to obtain, due to patient being confused.  MEDICATIONS AT HOME:  Prior to Admission medications   Medication Sig Start Date End Date Taking? Authorizing Provider  acyclovir (ZOVIRAX) 400 MG tablet TAKE 1 TABLET BY MOUTH ONCE DAILY TO  PREVENT  SHINGLES Patient taking differently: Take 400 mg by mouth at bedtime. TAKE 1 TABLET BY MOUTH ONCE DAILY TO  PREVENT  SHINGLES 04/25/17  Yes Cammie Sickle, MD  aspirin EC 81 MG tablet Take 1 tablet (81 mg total) by mouth daily. 08/06/17 10/05/17 Yes Sainani, Belia Heman, MD  dexamethasone (DECADRON) 4 MG tablet Take 1 tablet (4 mg total) by mouth 2 (two) times daily. Start 2 days prior to infusion; Take it for 4 days. 12/10/16  Yes Cammie Sickle, MD  divalproex (DEPAKOTE) 125 MG DR tablet Take 1 tablet by mouth daily. 07/04/17  Yes [provider]  Docusate Sodium (COLACE PO) Take 2 tablets by  mouth at bedtime.    Yes [provider]  megestrol (MEGACE) 40 MG tablet Take 1 tablet (40 mg total) by mouth 2 (two) times daily. 08/06/17 09/05/17 Yes Henreitta Leber, MD  mirtazapine (REMERON) 30 MG tablet TAKE 1 TABLET BY MOUTH AT BEDTIME 08/01/17  Yes Cammie Sickle, MD  risperiDONE (RISPERDAL) 1 MG tablet Take 1 tablet (1 mg total) by mouth at bedtime. 06/19/17  Yes Cammie Sickle, MD  fentaNYL (DURAGESIC - DOSED MCG/HR) 25 MCG/HR patch Place 1 patch (25 mcg total) onto the skin every 3 (three) days. 07/22/17   Cammie Sickle, MD  lidocaine-prilocaine (EMLA) cream  Apply 1 application topically as needed. Apply small amount to port site at least 1 hour prior to it being accessed, cover with plastic wrap 01/15/17   Cammie Sickle, MD  ondansetron (ZOFRAN) 4 MG tablet Take 1 tablet (4 mg total) by mouth every 8 (eight) hours as needed for nausea or vomiting. 06/12/16   Rudene Re, MD  oxyCODONE (OXY IR/ROXICODONE) 5 MG immediate release tablet Take 0.5 tablets (2.5 mg total) by mouth every 8 (eight) hours as needed for severe pain. Patient not taking: Reported on 08/07/2017 07/22/17   Cammie Sickle, MD  polyethylene glycol [facility] / Floria Raveling) packet Take 17 g by mouth at bedtime as needed for mild constipation.     [provider]      PHYSICAL EXAMINATION:   VITAL SIGNS: Blood pressure (!) 137/59, pulse (!) 103, temperature 98.7 F (37.1 C), temperature source Oral, resp. rate 20, height '5\' 2"'  (1.575 m), weight 46 kg (101 lb 6.4 oz), SpO2 94 %.  GENERAL:  84 y.o.-year-old patient lying in the bed with no acute distress.  EYES: No scleral icterus.  HEENT: Head atraumatic, normocephalic. Oropharynx and nasopharynx clear.  Dry mucous membranous are noted. NECK:  Supple, no jugular venous distention. No thyroid enlargement, no tenderness.  LUNGS: Reduced breath sounds bilaterally, no wheezing.  Scattered rhonchi is heard at right lower lung area. No use of accessory muscles of respiration.  CARDIOVASCULAR: S1, S2 normal. No S3/S4.  ABDOMEN: Soft, nontender, nondistended. Bowel sounds present. No organomegaly or mass.  EXTREMITIES: No pedal edema, cyanosis, or clubbing.  NEUROLOGIC: No focal weakness.  Gait not checked, due to patient's generalized weakness.  PSYCHIATRIC: The patient is alert and oriented x 3.  SKIN: No obvious rash, lesion, or ulcer.   LABORATORY PANEL:   CBC Recent Labs  Lab 08/04/17 1616 08/07/17 1905  WBC 13.4* 6.3  HGB 13.1 12.5  HCT 39.0 38.0  PLT 222 213  MCV 88.9 89.0  MCH 29.8 29.3  MCHC 33.6  32.9  RDW 14.9* 15.1*  LYMPHSABS  --  0.6*  MONOABS  --  0.8  EOSABS  --  0.0  BASOSABS  --  0.0   ------------------------------------------------------------------------------------------------------------------  Chemistries  Recent Labs  Lab 08/04/17 1616 08/07/17 1905  NA 131* 135  K 3.6 3.4*  CL 98* 106  CO2 24 20*  GLUCOSE 110* 118*  BUN 17 11  CREATININE 0.71 0.59  CALCIUM 8.4* 8.0*  AST 33 71*  ALT 18 58*  ALKPHOS 81 153*  BILITOT 0.7 0.6   ------------------------------------------------------------------------------------------------------------------ estimated creatinine clearance is 33.9 mL/min (by C-G formula based on SCr of 0.59 mg/dL). ------------------------------------------------------------------------------------------------------------------ No results for input(s): TSH, T4TOTAL, T3FREE, THYROIDAB in the last 72 hours.  Invalid input(s): FREET3   Coagulation profile No results for input(s): INR, PROTIME in the last 168 hours. ------------------------------------------------------------------------------------------------------------------- No  results for input(s): DDIMER in the last 72 hours. -------------------------------------------------------------------------------------------------------------------  Cardiac Enzymes Recent Labs  Lab 08/04/17 1616 08/07/17 1905  TROPONINI <0.03 <0.03   ------------------------------------------------------------------------------------------------------------------ Invalid input(s): POCBNP  ---------------------------------------------------------------------------------------------------------------  Urinalysis    Component Value Date/Time   COLORURINE YELLOW (A) 08/07/2017 1912   APPEARANCEUR CLEAR (A) 08/07/2017 1912   APPEARANCEUR Clear 04/02/2012 0015   LABSPEC 1.009 08/07/2017 1912   LABSPEC 1.011 04/02/2012 0015   PHURINE 7.0 08/07/2017 1912   GLUCOSEU NEGATIVE 08/07/2017 1912    GLUCOSEU Negative 04/02/2012 0015   HGBUR SMALL (A) 08/07/2017 1912   BILIRUBINUR NEGATIVE 08/07/2017 1912   BILIRUBINUR Negative 04/02/2012 0015   KETONESUR NEGATIVE 08/07/2017 1912   PROTEINUR NEGATIVE 08/07/2017 1912   NITRITE NEGATIVE 08/07/2017 1912   LEUKOCYTESUR NEGATIVE 08/07/2017 1912   LEUKOCYTESUR Negative 04/02/2012 0015     RADIOLOGY: Dg Chest 2 View  Result Date: 08/07/2017 CLINICAL DATA:  Sepsis. EXAM: CHEST  2 VIEW COMPARISON:  Chest x-ray dated August 04, 2017. FINDINGS: Unchanged right chest wall port catheter with tip in the proximal right atrium. The heart size and mediastinal contours are within normal limits. Normal pulmonary vascularity. There is new patchy airspace disease in the right middle and lower lobes. No pleural effusion or pneumothorax. No acute osseous abnormality. IMPRESSION: New right middle and lower lobe pneumonia. Electronically Signed   By: Titus Dubin M.D.   On: 08/07/2017 19:41    EKG: Orders placed or performed during the hospital encounter of 08/07/17  . ED EKG 12-Lead  . ED EKG 12-Lead    IMPRESSION AND PLAN:  1. HCAP, right middle and left lower lobe.  We will start IV antibiotics per protocol.  Continue oxygen therapy as needed and supportive measures.  Patient is DNR. 2.  Acute encephalopathy, likely secondary to pneumonia, and the setting of dementia.  Continue to monitor clinically closely, while treating the underlying disease. 3.  Hypertension, stable, will restart home medications.  4.  Multiple myeloma, stable, currently not undergoing any treatment.  We will continue to monitor.  All the records are reviewed and case discussed with ED provider. Management plans discussed with the patient, family and they are in agreement.  CODE STATUS:    Code Status Orders  (From admission, onward)        Start     Ordered   08/07/17 2258  Do not attempt resuscitation (DNR)  Continuous    Question Answer Comment  In the event  of cardiac or respiratory ARREST Do not call a "code blue"   In the event of cardiac or respiratory ARREST Do not perform Intubation, CPR, defibrillation or ACLS   In the event of cardiac or respiratory ARREST Use medication by any route, position, wound care, and other measures to relive pain and suffering. May use oxygen, suction and manual treatment of airway obstruction as needed for comfort.      08/07/17 2257    Code Status History    Date Active Date Inactive Code Status Order ID Comments User Context   08/05/2017 13:00 08/06/2017 17:35 DNR [id number]  Marga Hoots, RN Inpatient   08/04/2017 23:17 08/05/2017 13:00 Full Code [id number]  Lance Coon, MD Inpatient    Advance Directive Documentation     Most Recent Value  Type of Advance Directive  Out of facility DNR (pink MOST or yellow form)  Pre-existing out of facility DNR order (yellow form or pink MOST form)  No data  "MOST" Form in Place?  No data  TOTAL TIME TAKING CARE OF THIS PATIENT: 40 minutes.    Amelia Jo M.D on 08/07/2017 at 11:18 PM  Between 7am to 6pm - Pager - [phone]  After 6pm go to [email or web address] - password [email or web address] [facility]  [facility]  Office  [phone]  CC: Primary care physician; [facility]

## 2017-08-07 NOTE — Progress Notes (Addendum)
Pharmacy Antibiotic Note  [name] is a 84 y.o. female admitted on 08/07/2017 with pneumonia.  Pharmacy has been consulted for vancomycin dosing.  Plan: Vancomycin 1250mg  IV every 24 hours.  Goal trough 15-20 mcg/mL.  Vancomycin trough 3/4 @0530  Cefepime 2gm iv q12h    Height: 5\' 2"  (157.5 cm) Weight: 101 lb 6.4 oz (46 kg) IBW/kg (Calculated) : 50.1  Temp (24hrs), Avg:99.5 F (37.5 C), Min:99.3 F (37.4 C), Max:99.6 F (37.6 C)  Recent Labs  Lab 08/04/17 1616 08/07/17 1905  WBC 13.4* 6.3  CREATININE 0.71 0.59  LATICACIDVEN  --  1.9    Estimated Creatinine Clearance: 33.9 mL/min (by C-G formula based on SCr of 0.59 mg/dL).    Allergies  Allergen Reactions  . Biaxin [Clarithromycin] Diarrhea and Nausea And Vomiting  . Demerol [Meperidine] Nausea And Vomiting  . Hydrocodone Nausea And Vomiting  . Sulfa Antibiotics Hives  . Tramadol Diarrhea and Nausea And Vomiting  . Ultracet [Tramadol-Acetaminophen] Other (See Comments)    Antimicrobials this admission: Anti-infectives (From admission, onward)   Start     Dose/Rate Route Frequency Ordered Stop   08/08/17 0600  vancomycin (VANCOCIN) 1,250 mg in sodium chloride 0.9 % 250 mL IVPB     1,250 mg 166.7 mL/hr over 90 Minutes Intravenous Every 24 hours 08/07/17 2132     08/07/17 2030  ceFEPIme (MAXIPIME) 2 g in sodium chloride 0.9 % 100 mL IVPB     2 g 200 mL/hr over 30 Minutes Intravenous  Once 08/07/17 2017 08/07/17 2119   08/07/17 2030  vancomycin (VANCOCIN) IVPB 1000 mg/200 mL premix     1,000 mg 200 mL/hr over 60 Minutes Intravenous  Once 08/07/17 2017         Microbiology results: No results found for this or any previous visit (from the past 240 hour(s)).   Thank you for allowing pharmacy to be a part of this patient's care.  Donna Christen Nadra Hritz 08/07/2017 9:33 PM

## 2017-08-07 NOTE — ED Provider Notes (Signed)
[facility] Emergency Department Provider Note    First MD Initiated Contact with Patient 08/07/17 1843     (approximate)  I have reviewed the triage vital signs and the nursing notes.   HISTORY  Chief Complaint Altered Mental Status  Level V Caveat:  dementia  HPI [name] is a 84 y.o. female presents for evaluation of confusion and fever that started this evening.  Patient does have a history of multiple myeloma not on any chemotherapy.  Patient is a DNR.  Denies any pain or discomfort upon arrival to the ER.  Seems a bit confused as to why she is here.  Denies any cough.  No chest pain.  No nausea or vomiting.  No diarrhea.  Past Medical History:  Diagnosis Date  . Anxiety   . Arthritis   . Atrial fibrillation ([place])   . Coronary arteriosclerosis   . History of blood transfusion 2014  . Hyperlipidemia   . Hypertension   . Multiple myeloma ([place])   . Multiple myeloma in relapse ([place]) 10/29/2014  . 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 Cancer Father   . Heart attack Mother    Past Surgical History:  Procedure Laterality Date  . ABDOMINAL HYSTERECTOMY    . APPENDECTOMY    . PARTIAL COLECTOMY    . TOTAL KNEE REVISION Left    Patient Active Problem List   Diagnosis Date Noted  . Left leg weakness 08/04/2017  . Dehydration 02/19/2017  . Pain in right hip 12/28/2015  . Pain in lower back 12/28/2015  . Rheumatoid arthritis ([place]) 03/29/2015  . Inflammatory polyarthropathy ([place]) 03/29/2015  . Multiple myeloma not having achieved remission ([place]) 10/29/2014  . Absolute anemia 10/24/2014  . CAD in native artery 10/24/2014  . Benign essential HTN 10/24/2014  . MI (mitral incompetence) 05/25/2014  . AF (paroxysmal atrial fibrillation) ([place]) 05/25/2014  . Paroxysmal atrial fibrillation ([place]) 05/25/2014      Prior to Admission medications   Medication Sig Start Date End Date Taking?  Authorizing Provider  acyclovir (ZOVIRAX) 400 MG tablet TAKE 1 TABLET BY MOUTH ONCE DAILY TO  PREVENT  SHINGLES Patient taking differently: Take 400 mg by mouth at bedtime. TAKE 1 TABLET BY MOUTH ONCE DAILY TO  PREVENT  SHINGLES 04/25/17  Yes Cammie Sickle, MD  aspirin EC 81 MG tablet Take 1 tablet (81 mg total) by mouth daily. 08/06/17 10/05/17 Yes Sainani, Belia Heman, MD  dexamethasone (DECADRON) 4 MG tablet Take 1 tablet (4 mg total) by mouth 2 (two) times daily. Start 2 days prior to infusion; Take it for 4 days. 12/10/16  Yes Cammie Sickle, MD  divalproex (DEPAKOTE) 125 MG DR tablet Take 1 tablet by mouth daily. 07/04/17  Yes [provider]  Docusate Sodium (COLACE PO) Take 2 tablets by mouth at bedtime.    Yes [provider]  megestrol (MEGACE) 40 MG tablet Take 1 tablet (40 mg total) by mouth 2 (two) times daily. 08/06/17 09/05/17 Yes Henreitta Leber, MD  mirtazapine (REMERON) 30 MG tablet TAKE 1 TABLET BY MOUTH AT BEDTIME 08/01/17  Yes Cammie Sickle, MD  risperiDONE (RISPERDAL) 1 MG tablet Take 1 tablet (1 mg total) by mouth at bedtime. 06/19/17  Yes Cammie Sickle, MD  fentaNYL (DURAGESIC - DOSED MCG/HR) 25 MCG/HR patch Place 1 patch (25 mcg total) onto the skin every 3 (three) days. 07/22/17  Cammie Sickle, MD  lidocaine-prilocaine (EMLA) cream Apply 1 application topically as needed. Apply small amount to port site at least 1 hour prior to it being accessed, cover with plastic wrap 01/15/17   Cammie Sickle, MD  ondansetron (ZOFRAN) 4 MG tablet Take 1 tablet (4 mg total) by mouth every 8 (eight) hours as needed for nausea or vomiting. 06/12/16   Rudene Re, MD  oxyCODONE (OXY IR/ROXICODONE) 5 MG immediate release tablet Take 0.5 tablets (2.5 mg total) by mouth every 8 (eight) hours as needed for severe pain. Patient not taking: Reported on 08/07/2017 07/22/17   Cammie Sickle, MD  polyethylene glycol [facility] / Floria Raveling) packet  Take 17 g by mouth at bedtime as needed for mild constipation.     [provider]    Allergies Biaxin [clarithromycin]; Demerol [meperidine]; Hydrocodone; Sulfa antibiotics; Tramadol; and Ultracet [tramadol-acetaminophen]    Social History Social History   Tobacco Use  . Smoking status: Never Smoker  . Smokeless tobacco: Never Used  Substance Use Topics  . Alcohol use: Yes    Comment: occasional  . Drug use: No    Review of Systems Patient denies headaches, rhinorrhea, blurry vision, numbness, shortness of breath, chest pain, edema, cough, abdominal pain, nausea, vomiting, diarrhea, dysuria, fevers, rashes or hallucinations unless otherwise stated above in HPI. ____________________________________________   PHYSICAL EXAM:  VITAL SIGNS: Vitals:   08/07/17 2028 08/07/17 2030  BP:  137/75  Pulse: (!) 111 (!) 110  Resp:    Temp: 99.6 F (37.6 C)   SpO2:  93%    Constitutional: Alert in no acute distress. Eyes: Conjunctivae are normal.  Head: Atraumatic. Nose: No congestion/rhinnorhea. Mouth/Throat: Mucous membranes are moist.   Neck: No stridor. Painless ROM.  Cardiovascular: mildly tachycardic rate, regular rhythm. Grossly normal heart sounds.  Good peripheral circulation. Respiratory: Normal respiratory effort.  No retractions. Lungs with rhonchi in right lung fields Gastrointestinal: Soft and nontender. No distention. No abdominal bruits. No CVA tenderness. Genitourinary:  Musculoskeletal: No lower extremity tenderness nor edema.  No joint effusions. Neurologic:  Normal speech and language. No gross focal neurologic deficits are appreciated. No facial droop Skin:  Skin is warm, dry and intact. No rash noted. Psychiatric: appropriate  ____________________________________________   LABS (all labs ordered are listed, but only abnormal results are displayed)  Results for orders placed or performed during the hospital encounter of 08/07/17 (from the past  24 hour(s))  Lactic acid, plasma     Status: None   Collection Time: 08/07/17  7:05 PM  Result Value Ref Range   Lactic Acid, Venous 1.9 0.5 - 1.9 mmol/L  Comprehensive metabolic panel     Status: Abnormal   Collection Time: 08/07/17  7:05 PM  Result Value Ref Range   Sodium 135 135 - 145 mmol/L   Potassium 3.4 (L) 3.5 - 5.1 mmol/L   Chloride 106 101 - 111 mmol/L   CO2 20 (L) 22 - 32 mmol/L   Glucose, Bld 118 (H) 65 - 99 mg/dL   BUN 11 6 - 20 mg/dL   Creatinine, Ser 0.59 0.44 - 1.00 mg/dL   Calcium 8.0 (L) 8.9 - 10.3 mg/dL   Total Protein 6.8 6.5 - 8.1 g/dL   Albumin 3.1 (L) 3.5 - 5.0 g/dL   AST 71 (H) 15 - 41 U/L   ALT 58 (H) 14 - 54 U/L   Alkaline Phosphatase 153 (H) 38 - 126 U/L   Total Bilirubin 0.6 0.3 - 1.2 mg/dL  GFR calc non Af Amer >60 >60 mL/min   GFR calc Af Amer >60 >60 mL/min   Anion gap 9 5 - 15  Troponin I     Status: None   Collection Time: 08/07/17  7:05 PM  Result Value Ref Range   Troponin I <0.03 <0.03 ng/mL  CBC WITH DIFFERENTIAL     Status: Abnormal   Collection Time: 08/07/17  7:05 PM  Result Value Ref Range   WBC 6.3 3.6 - 11.0 K/uL   RBC 4.26 3.80 - 5.20 MIL/uL   Hemoglobin 12.5 12.0 - 16.0 g/dL   HCT 38.0 35.0 - 47.0 %   MCV 89.0 80.0 - 100.0 fL   MCH 29.3 26.0 - 34.0 pg   MCHC 32.9 32.0 - 36.0 g/dL   RDW 15.1 (H) 11.5 - 14.5 %   Platelets 213 150 - 440 K/uL   Neutrophils Relative % 78 %   Neutro Abs 4.9 1.4 - 6.5 K/uL   Lymphocytes Relative 9 %   Lymphs Abs 0.6 (L) 1.0 - 3.6 K/uL   Monocytes Relative 13 %   Monocytes Absolute 0.8 0.2 - 0.9 K/uL   Eosinophils Relative 0 %   Eosinophils Absolute 0.0 0 - 0.7 K/uL   Basophils Relative 0 %   Basophils Absolute 0.0 0 - 0.1 K/uL   ____________________________________________  EKG My review and personal interpretation at Time: 20:52   Indication: pna  Rate: 110  Rhythm: sinus Axis: normal Other: normal intervals, no stemi ____________________________________________  RADIOLOGY  I  personally reviewed all radiographic images ordered to evaluate for the above acute complaints and reviewed radiology reports and findings.  These findings were personally discussed with the patient.  Please see medical record for radiology report.  ____________________________________________   PROCEDURES  Procedure(s) performed:  Procedures    Critical Care performed: no ____________________________________________   INITIAL IMPRESSION / ASSESSMENT AND PLAN / ED COURSE  Pertinent labs & imaging results that were available during my care of the patient were reviewed by me and considered in my medical decision making (see chart for details).  DDX: Dehydration, sepsis, pna, uti, hypoglycemia, cva, drug effect, withdrawal, encephalitis   Kasee Marlet Korte is a 84 y.o. who presents to the ED with weakness symptoms as described above.  Patient febrile in route but was given Tylenol.  Still tachycardic with mild hypoxia on room air.  Patient given IV fluids.  Septic workup sent and shows evidence of acute pneumonia.  Patient with recent hospitalization therefore will treat for healthcare associated pneumonia.  Have discussed with the patient and available family all diagnostics and treatments performed thus far and all questions were answered to the best of my ability. The patient demonstrates understanding and agreement with plan.       ____________________________________________   FINAL CLINICAL IMPRESSION(S) / ED DIAGNOSES  Final diagnoses:  Healthcare-associated pneumonia  Weakness      NEW MEDICATIONS STARTED DURING THIS VISIT:  New Prescriptions   No medications on file     Note:  This document was prepared using Dragon voice recognition software and may include unintentional dictation errors.    Merlyn Lot, MD 08/07/17 2056

## 2017-08-08 ENCOUNTER — Telehealth: Payer: Self-pay | Admitting: *Deleted

## 2017-08-08 LAB — PROCALCITONIN
PROCALCITONIN: 0.14 ng/mL
PROCALCITONIN: 0.2 ng/mL

## 2017-08-08 LAB — BASIC METABOLIC PANEL
Anion gap: 9 (ref 5–15)
BUN: 10 mg/dL (ref 6–20)
CALCIUM: 8 mg/dL — AB (ref 8.9–10.3)
CO2: 19 mmol/L — ABNORMAL LOW (ref 22–32)
Chloride: 109 mmol/L (ref 101–111)
Creatinine, Ser: 0.65 mg/dL (ref 0.44–1.00)
GFR calc Af Amer: >60 mL/min (ref >60)
GLUCOSE: 145 mg/dL — AB (ref 65–99)
Potassium: 3.6 mmol/L (ref 3.5–5.1)
Sodium: 137 mmol/L (ref 135–145)

## 2017-08-08 LAB — CBC
HCT: 36.2 % (ref 35.0–47.0)
HEMOGLOBIN: 12.2 g/dL (ref 12.0–16.0)
MCH: 30.1 pg (ref 26.0–34.0)
MCHC: 33.8 g/dL (ref 32.0–36.0)
MCV: 88.9 fL (ref 80.0–100.0)
Platelets: 214 10*3/uL (ref 150–440)
RBC: 4.07 MIL/uL (ref 3.80–5.20)
RDW: 15.4 % — AB (ref 11.5–14.5)
WBC: 7 10*3/uL (ref 3.6–11.0)

## 2017-08-08 LAB — GLUCOSE, CAPILLARY: GLUCOSE-CAPILLARY: 137 mg/dL — AB (ref 65–99)

## 2017-08-08 LAB — LACTIC ACID, PLASMA: LACTIC ACID, VENOUS: 1.1 mmol/L (ref 0.5–1.9)

## 2017-08-08 NOTE — Progress Notes (Signed)
Pharmacy Antibiotic Note  [name] is a 84 y.o. female admitted on 08/07/2017 with pneumonia.  Pharmacy has been consulted for cefepime dosing.  Vancomycin discontinued per discussion with MD as MRSA PCR is negative.  Plan: Continue cefepime 2 g IV q12h  Height: 5' (152.4 cm) Weight: 98 lb 3.2 oz (44.5 kg) IBW/kg (Calculated) : 45.5  Temp (24hrs), Avg:99 F (37.2 C), Min:98.5 F (36.9 C), Max:99.6 F (37.6 C)  Recent Labs  Lab 08/04/17 1616 08/07/17 1905 08/08/17 0357  WBC 13.4* 6.3 7.0  CREATININE 0.71 0.59 0.65  LATICACIDVEN  --  1.9 1.1    Estimated Creatinine Clearance: 32.8 mL/min (by C-G formula based on SCr of 0.65 mg/dL).    Allergies  Allergen Reactions  . Biaxin [Clarithromycin] Diarrhea and Nausea And Vomiting  . Demerol [Meperidine] Nausea And Vomiting  . Hydrocodone Nausea And Vomiting  . Sulfa Antibiotics Hives  . Tramadol Diarrhea and Nausea And Vomiting  . Ultracet [Tramadol-Acetaminophen] Other (See Comments)    Antimicrobials this admission: Cefepime 2/28 >> Vancomycin 2/28 > 3/1  Dose adjustments this admission:  Microbiology results: 2/28 BCx: Sent 2/28 MRSA PCR: Negative  Thank you for allowing pharmacy to be a part of this patient's care.  Lenis Noon, PharmD, BCPS Clinical Pharmacist 08/08/2017 1:18 PM

## 2017-08-08 NOTE — Progress Notes (Signed)
[place] at [place] NAME: [name]    MR#:  [id number]  DATE OF BIRTH:  08-17-1924  SUBJECTIVE:seen, treated for fever, healthcare associated pneumonia.  Patient is afebrile this morning, on room air without any hypoxia.  Refusing Pure wik and wants to use bedside commode for urinal.  CHIEF COMPLAINT:   Chief Complaint  Patient presents with  . Altered Mental Status    REVIEW OF SYSTEMS:   ROS CONSTITUTIONAL: No fever, fatigue or weakness.  EYES: No blurred or double vision.  EARS, NOSE, AND THROAT: No tinnitus or ear pain.  RESPIRATORY: cough,SOB. CARDIOVASCULAR: No chest pain, orthopnea, edema.  GASTROINTESTINAL: No nausea, vomiting, diarrhea or abdominal pain.  GENITOURINARY: No dysuria, hematuria.  ENDOCRINE: No polyuria, nocturia,  HEMATOLOGY: No anemia, easy bruising or bleeding SKIN: No rash or lesion. MUSCULOSKELETAL: No joint pain or arthritis.   NEUROLOGIC: No tingling, numbness, weakness.  PSYCHIATRY: No anxiety or depression.   DRUG ALLERGIES:   Allergies  Allergen Reactions  . Biaxin [Clarithromycin] Diarrhea and Nausea And Vomiting  . Demerol [Meperidine] Nausea And Vomiting  . Hydrocodone Nausea And Vomiting  . Sulfa Antibiotics Hives  . Tramadol Diarrhea and Nausea And Vomiting  . Ultracet [Tramadol-Acetaminophen] Other (See Comments)    VITALS:  Blood pressure (!) 148/60, pulse 99, temperature 98.5 F (36.9 C), temperature source Oral, resp. rate 20, height 5' (1.524 m), weight 44.5 kg (98 lb 3.2 oz), SpO2 98 %.  PHYSICAL EXAMINATION:  GENERAL:  84 y.o.-year-old patient lying in the bed with no acute distress.  EYES: Pupils equal, round, reactive to light and accommodation. No scleral icterus. Extraocular muscles intact.  HEENT: Head atraumatic, normocephalic. Oropharynx and nasopharynx clear.  NECK:  Supple, no jugular venous distention. No thyroid enlargement, no tenderness.  LUNGS:  deCreased breath sounds at bases. CARDIOVASCULAR: S1, S2 normal. No murmurs, rubs, or gallops.  ABDOMEN: Soft, nontender, nondistended. Bowel sounds present. No organomegaly or mass.  EXTREMITIES: No pedal edema, cyanosis, or clubbing.  NEUROLOGIC: Cranial nerves II through XII are intact. Muscle strength 5/5 in all extremities. Sensation intact. Gait not checked.  PSYCHIATRIC: The patient is alert and oriented x 3.  SKIN: No obvious rash, lesion, or ulcer.    LABORATORY PANEL:   CBC Recent Labs  Lab 08/08/17 0357  WBC 7.0  HGB 12.2  HCT 36.2  PLT 214   ------------------------------------------------------------------------------------------------------------------  Chemistries  Recent Labs  Lab 08/07/17 1905 08/08/17 0357  NA 135 137  K 3.4* 3.6  CL 106 109  CO2 20* 19*  GLUCOSE 118* 145*  BUN 11 10  CREATININE 0.59 0.65  CALCIUM 8.0* 8.0*  AST 71*  --   ALT 58*  --   ALKPHOS 153*  --   BILITOT 0.6  --    ------------------------------------------------------------------------------------------------------------------  Cardiac Enzymes Recent Labs  Lab 08/07/17 1905  TROPONINI <0.03   ------------------------------------------------------------------------------------------------------------------  RADIOLOGY:  Dg Chest 2 View  Result Date: 08/07/2017 CLINICAL DATA:  Sepsis. EXAM: CHEST  2 VIEW COMPARISON:  Chest x-ray dated August 04, 2017. FINDINGS: Unchanged right chest wall port catheter with tip in the proximal right atrium. The heart size and mediastinal contours are within normal limits. Normal pulmonary vascularity. There is new patchy airspace disease in the right middle and lower lobes. No pleural effusion or pneumothorax. No acute osseous abnormality. IMPRESSION: New right middle and lower lobe pneumonia. Electronically Signed   By: Titus Dubin M.D.   On: 08/07/2017 19:41  EKG:   Orders placed or performed during the hospital encounter of  08/07/17  . ED EKG 12-Lead  . ED EKG 12-Lead    ASSESSMENT AND PLAN:   #1 healthcare associated pneumonia in the right lung: Continue IV antibiotics.  Just discharged from hospital a day ago on 27 February.  Vancomycin, cefepime. 2.  Acute metabolic encephalopathy secondary to pneumonia, dementia: Continue clinical monitoring.  Patient appears alert during my visit. 3.  Hypertension: Controlled. 4.  History of multiple myeloma.,  Chronic pain: Continue fentanyl patch 25 mcg every 72 hours.  #5 generalized weakness: Patient had workup with MRI of the brain, seen by neurology.  Thought to be due to dehydration.  Patient is at high risk for falls.  Physical therapy recommended home health services with the last discharge. 6.  Proximal atrial fibrillation: Rate controlled.  Continue aspirin for anticoagulation.  All the records are reviewed and case discussed with Care Management/Social Workerr. Management plans discussed with the patient, family and they are in agreement.  CODE STATUS: DNR  TOTAL TIME TAKING CARE OF THIS PATIENT: 81mnutes.   POSSIBLE D/C IN 1-2 DAYS, DEPENDING ON CLINICAL CONDITION.   SEpifanio LeschesM.D on 08/08/2017 at 10:03 AM  Between 7am to 6pm - Pager - [phone]  After 6pm go to [email or web address] - password [email or web address]  Office  [phone] CC: Primary care physician; MLangley GaussPrimary Care   Note: This dictation was prepared with Dragon dictation along with smaller phrase technology. Any transcriptional errors that result from this process are unintentional.

## 2017-08-08 NOTE — Telephone Encounter (Signed)
[name] called to report that [name] was discharged form hospital yesterday, but had to be rushed back last night to ER and is readmitted with R lung pneumonia. Wanted Dr B to know

## 2017-08-09 LAB — URINE CULTURE

## 2017-08-09 LAB — PROCALCITONIN: PROCALCITONIN: 0.12 ng/mL

## 2017-08-09 LAB — GLUCOSE, CAPILLARY: GLUCOSE-CAPILLARY: 88 mg/dL (ref 65–99)

## 2017-08-09 NOTE — Progress Notes (Signed)
[place] at [place] NAME: [name]    MR#:  [id number]  DATE OF BIRTH:  03/21/1925  SUBJECTIVE: Admitted for healthcare associated pneumonia.  She is doing well, no fever, no hypoxia.Marland Kitchen  CHIEF COMPLAINT:   Chief Complaint  Patient presents with  . Altered Mental Status    REVIEW OF SYSTEMS:   ROS CONSTITUTIONAL: No fever, fatigue or weakness.  EYES: No blurred or double vision.  EARS, NOSE, AND THROAT: No tinnitus or ear pain.  RESPIRATORY: cough,SOB. CARDIOVASCULAR: No chest pain, orthopnea, edema.  GASTROINTESTINAL: No nausea, vomiting, diarrhea or abdominal pain.  GENITOURINARY: No dysuria, hematuria.  ENDOCRINE: No polyuria, nocturia,  HEMATOLOGY: No anemia, easy bruising or bleeding SKIN: No rash or lesion. MUSCULOSKELETAL: No joint pain or arthritis.   NEUROLOGIC: No tingling, numbness, weakness.  PSYCHIATRY: No anxiety or depression.   DRUG ALLERGIES:   Allergies  Allergen Reactions  . Biaxin [Clarithromycin] Diarrhea and Nausea And Vomiting  . Demerol [Meperidine] Nausea And Vomiting  . Hydrocodone Nausea And Vomiting  . Sulfa Antibiotics Hives  . Tramadol Diarrhea and Nausea And Vomiting  . Ultracet [Tramadol-Acetaminophen] Other (See Comments)    VITALS:  Blood pressure (!) 117/56, pulse 93, temperature 98.1 F (36.7 C), resp. rate 18, height 5' (1.524 m), weight 48.7 kg (107 lb 4.8 oz), SpO2 94 %.  PHYSICAL EXAMINATION:  GENERAL:  84 y.o.-year-old patient lying in the bed with no acute distress.  EYES: Pupils equal, round, reactive to light and accommodation. No scleral icterus. Extraocular muscles intact.  HEENT: Head atraumatic, normocephalic. Oropharynx and nasopharynx clear.  NECK:  Supple, no jugular venous distention. No thyroid enlargement, no tenderness.  LUNGS: deCreased breath sounds at bases. CARDIOVASCULAR: S1, S2 normal. No murmurs, rubs, or gallops.  ABDOMEN: Soft, nontender,  nondistended. Bowel sounds present. No organomegaly or mass.  EXTREMITIES: No pedal edema, cyanosis, or clubbing.  NEUROLOGIC: Cranial nerves II through XII are intact. Muscle strength 5/5 in all extremities. Sensation intact. Gait not checked.  PSYCHIATRIC: The patient is alert and oriented x 3.  SKIN: No obvious rash, lesion, or ulcer.    LABORATORY PANEL:   CBC Recent Labs  Lab 08/08/17 0357  WBC 7.0  HGB 12.2  HCT 36.2  PLT 214   ------------------------------------------------------------------------------------------------------------------  Chemistries  Recent Labs  Lab 08/07/17 1905 08/08/17 0357  NA 135 137  K 3.4* 3.6  CL 106 109  CO2 20* 19*  GLUCOSE 118* 145*  BUN 11 10  CREATININE 0.59 0.65  CALCIUM 8.0* 8.0*  AST 71*  --   ALT 58*  --   ALKPHOS 153*  --   BILITOT 0.6  --    ------------------------------------------------------------------------------------------------------------------  Cardiac Enzymes Recent Labs  Lab 08/07/17 1905  TROPONINI <0.03   ------------------------------------------------------------------------------------------------------------------  RADIOLOGY:  Dg Chest 2 View  Result Date: 08/07/2017 CLINICAL DATA:  Sepsis. EXAM: CHEST  2 VIEW COMPARISON:  Chest x-ray dated August 04, 2017. FINDINGS: Unchanged right chest wall port catheter with tip in the proximal right atrium. The heart size and mediastinal contours are within normal limits. Normal pulmonary vascularity. There is new patchy airspace disease in the right middle and lower lobes. No pleural effusion or pneumothorax. No acute osseous abnormality. IMPRESSION: New right middle and lower lobe pneumonia. Electronically Signed   By: Titus Dubin M.D.   On: 08/07/2017 19:41    EKG:   Orders placed or performed during the hospital encounter of 08/07/17  . ED EKG 12-Lead  .  ED EKG 12-Lead    ASSESSMENT AND PLAN:   #1 healthcare associated pneumonia in the right  lung: Continue IV antibiotics.  With cefepime.  Just discharged from hospital a day ago on 27 February.  MRSA screen negative.  So we discontinued vancomycin. 2.  Acute metabolic encephalopathy secondary to pneumonia, dementia: Clearing up.  She is alert, eating her breakfast.   3.  Hypertension: Controlled. 4.  History of multiple myeloma.,  Chronic pain: Continue fentanyl patch 25 mcg every 72 hours.  #5 generalized weakness: Patient had workup with MRI of the brain, seen by neurology.  Thought to be due to dehydration.  Patient is at high risk for falls.  Physical therapy recommended home health services with the last discharge.  Physical therapy consult again.   again. 6.  Proximal atrial fibrillation: Rate controlled.  Continue aspirin for anticoagulation.  All the records are reviewed and case discussed with Care Management/Social Workerr. Management plans discussed with the patient, family and they are in agreement.  CODE STATUS: DNR  TOTAL TIME TAKING CARE OF THIS PATIENT: 68mnutes.   POSSIBLE D/C IN 1-2 DAYS, DEPENDING ON CLINICAL CONDITION.   SEpifanio LeschesM.D on 08/09/2017 at 12:03 PM  Between 7am to 6pm - Pager - [phone]  After 6pm go to [email or web address] - password [email or web address]  Office  [phone] CC: Primary care physician; MLangley GaussPrimary Care   Note: This dictation was prepared with Dragon dictation along with smaller phrase technology. Any transcriptional errors that result from this process are unintentional.

## 2017-08-10 LAB — GLUCOSE, CAPILLARY: GLUCOSE-CAPILLARY: 82 mg/dL (ref 65–99)

## 2017-08-10 NOTE — Evaluation (Signed)
Physical Therapy Evaluation Patient Details Name: [name] MRN: [id number] DOB: 1925/05/08 Today's Date: 08/10/2017   History of Present Illness  Pt is a 84 year old female admitted for weakness, HCAP and sepsis after experiencing confusion and fever. PMH includes multiple myeloma, osteonecrosis of jaw, Htn, HLD, coronary arteriosclerosis, anxiety, a-fib and arthritis.  Clinical Impression  Pt is a 84 y.o. Female who lives in a one story home alone.  She is in bed upon PT arrival and able to scoot to EOB without PT assist.  Pt did, however require min-mod A for sit to supine into bed, repositioning and STS from EOB.  Pt presented as weak and with a flat affect but oriented to place and self.  Pt presented with overall weakness of UE and LE and reported no sensation loss in bilateral feet.  She attempted negotiation of a standard size RW but found that a pediatric RW was easier for her to manage.  Pt ambulated a total of 30 ft, reporting fatigue and discomfort in her LE's at 15 ft.  Pt sat in recliner, requiring assistance to scoot back, and reported increase in L hip pain in sitting position.  Pt requested to move to bed and was able to find a comfortable position in supine.  PT assisted pt in a standing pivot transfer during which pt also used furniture for balance support.  Pt TUG time and posture during gait indicate a fall risk during dynamic gait activity.  Pt will continue to benefit from skilled PT with focus on strength, balance, safe use of RW, tolerance to activity, functional mobility and management of hip pain.    Follow Up Recommendations SNF    Equipment Recommendations       Recommendations for Other Services       Precautions / Restrictions Precautions Precautions: Fall Precaution Comments: HFR Restrictions Weight Bearing Restrictions: No      Mobility  Bed Mobility Overal bed mobility: Needs Assistance Bed Mobility: Sit to Supine       Sit to supine: Mod  assist   General bed mobility comments: Pt required assistance to bring LE's over EOB and to scoot up in bed.  Pt attempted partial bridge and to assist in scooting up in bed with LE's.  Transfers Overall transfer level: Needs assistance Equipment used: Rolling walker (2 wheeled);1 person hand held assist Transfers: Sit to/from [facility] Sit to Stand: Min assist Stand pivot transfers: Min assist       General transfer comment: Pt required multiple attempts to stand from chair or EOB and ultimately min assist to initate transfer.  Pt requires VC's for upright posture when standing and for management of RW.  PT provided min A for pt to stand pivot chair to bed during which pt also used furniture for balance and support.  Ambulation/Gait Ambulation/Gait assistance: Supervision Ambulation Distance (Feet): 30 Feet Assistive device: Rolling walker (2 wheeled)     Gait velocity interpretation: Below normal speed for age/gender General Gait Details: Pt requires pediatric walker for safe negotiation, maintains a flexed posture and short shuffling steps, low foot clearance and need for frequent VC's to look ahead instead of down at the floor.  Pt reported fatigue, discomfort and weakness at 15 ft of ambulation.  Stairs            Wheelchair Mobility    Modified Rankin (Stroke Patients Only)       Balance Overall balance assessment: Modified Independent  Standardized Balance Assessment Standardized Balance Assessment : TUG: Timed Up and Go Test     Timed Up and Go Test TUG: Normal TUG(with RW and standing assist) Normal TUG (seconds): 20     Pertinent Vitals/Pain Pain Assessment: 0-10 Faces Pain Scale: Hurts little more Pain Location: Pt reported L hip pain following ambulation and sitting in recliner.  Asked to get back in bed and stated that hip pain was alleviated in supine position. Pain Intervention(s):  Repositioned    Home Living Family/patient expects to be discharged to:: Skilled nursing facility Living Arrangements: Alone Available Help at Discharge: Family Type of Home: House Home Access: [place]: One [place]: Environmental consultant - 4 wheels      Prior Function Level of Independence: Independent with assistive device(s)               Hand Dominance        Extremity/Trunk Assessment   Upper Extremity Assessment Upper Extremity Assessment: Generalized weakness    Lower Extremity Assessment Lower Extremity Assessment: Generalized weakness    Cervical / Trunk Assessment Cervical / Trunk Assessment: Kyphotic(Maintains a flexed posture in standing and witting at EOB)  Communication   Communication: No difficulties  Cognition Arousal/Alertness: Awake/alert Behavior During Therapy: Flat affect Overall Cognitive Status: History of cognitive impairments - at baseline                                 General Comments: Pt pleasant, oriented to self and location, able to follow commands      General Comments      Exercises Other Exercises Other Exercises: PT instructed pt in partial bridge x4 and educated concerning repositioning in bed. Other Exercises: STS x4 with VC's for body mechanics and min A   Assessment/Plan    PT Assessment Patient needs continued PT services  PT Problem List Decreased strength;Decreased mobility;Decreased safety awareness;Decreased activity tolerance;Decreased balance;Decreased knowledge of use of DME;Pain       PT Treatment Interventions DME instruction;Therapeutic activities;Gait training;Therapeutic exercise;Stair training;Balance training;Functional mobility training;Patient/family education    PT Goals (Current goals can be found in the Care Plan section)  Acute Rehab PT Goals Patient Stated Goal: To regain strength and be able to walk in her home like she did before. PT Goal  Formulation: With patient Time For Goal Achievement: 08/24/17 Potential to Achieve Goals: Good    Frequency Min 2X/week   Barriers to discharge        Co-evaluation               AM-PAC PT "6 Clicks" Daily Activity  Outcome Measure Difficulty turning over in bed (including adjusting bedclothes, sheets and blankets)?: A Little Difficulty moving from lying on back to sitting on the side of the bed? : A Little Difficulty sitting down on and standing up from a chair with arms (e.g., wheelchair, bedside commode, etc,.)?: A Little Help needed moving to and from a bed to chair (including a wheelchair)?: A Little Help needed walking in hospital room?: A Little Help needed climbing 3-5 steps with a railing? : A Little 6 Click Score: 18    End of Session Equipment Utilized During Treatment: Gait belt Activity Tolerance: Patient limited by fatigue Patient left: in bed;with call bell/phone within reach;with bed alarm set Nurse Communication: Mobility status PT Visit Diagnosis: Unsteadiness on feet (R26.81);Muscle weakness (generalized) (M62.81);Pain Pain - Right/Left: Left Pain -  part of body: Hip    Time: 0915-0940 PT Time Calculation (min) (ACUTE ONLY): 25 min   Charges:   PT Evaluation $PT Eval Low Complexity: 1 Low PT Treatments $Therapeutic Activity: 8-22 mins   PT G Codes:   PT G-Codes **NOT FOR INPATIENT CLASS** Functional Assessment Tool Used: AM-PAC 6 Clicks Basic Mobility    Roxanne Gates, PT, DPT   Roxanne Gates 08/10/2017, 10:19 AM

## 2017-08-10 NOTE — Progress Notes (Signed)
[place] at [place] NAME: [name]    MR#:  [id number]  DATE OF BIRTH:  05-Mar-1925  SUBJECTIVE: Patient is slightly confused this morning and states that she isnot comfortable in the bed.  Adjusted bed position but did not help her.  CHIEF COMPLAINT:   Chief Complaint  Patient presents with  . Altered Mental Status    REVIEW OF SYSTEMS:   ROS CONSTITUTIONAL: No fever, fatigue or weakness.  EYES: No blurred or double vision.  EARS, NOSE, AND THROAT: No tinnitus or ear pain.  RESPIRATORY: cough,SOB. CARDIOVASCULAR: No chest pain, orthopnea, edema.  GASTROINTESTINAL: No nausea, vomiting, diarrhea or abdominal pain.  GENITOURINARY: No dysuria, hematuria.  ENDOCRINE: No polyuria, nocturia,  HEMATOLOGY: No anemia, easy bruising or bleeding SKIN: No rash or lesion. MUSCULOSKELETAL: No joint pain or arthritis.   NEUROLOGIC: No tingling, numbness, weakness.  PSYCHIATRY: No anxiety or depression.   DRUG ALLERGIES:   Allergies  Allergen Reactions  . Biaxin [Clarithromycin] Diarrhea and Nausea And Vomiting  . Demerol [Meperidine] Nausea And Vomiting  . Hydrocodone Nausea And Vomiting  . Sulfa Antibiotics Hives  . Tramadol Diarrhea and Nausea And Vomiting  . Ultracet [Tramadol-Acetaminophen] Other (See Comments)    VITALS:  Blood pressure (!) 160/62, pulse 96, temperature 98.6 F (37 C), temperature source Oral, resp. rate (!) 22, height 5' (1.524 m), weight 49.3 kg (108 lb 9.6 oz), SpO2 96 %.  PHYSICAL EXAMINATION:  GENERAL:  84 y.o.-year-old patient lying in the bed with no acute distress.  EYES: Pupils equal, round, reactive to light and accommodation. No scleral icterus. Extraocular muscles intact.  HEENT: Head atraumatic, normocephalic. Oropharynx and nasopharynx clear.  NECK:  Supple, no jugular venous distention. No thyroid enlargement, no tenderness.  LUNGS: deCreased breath sounds at bases. CARDIOVASCULAR: S1, S2  normal. No murmurs, rubs, or gallops.  ABDOMEN: Soft, nontender, nondistended. Bowel sounds present. No organomegaly or mass.  EXTREMITIES: No pedal edema, cyanosis, or clubbing.  NEUROLOGIC: Cranial nerves II through XII are intact. Muscle strength 5/5 in all extremities. Sensation intact. Gait not checked.  PSYCHIATRIC: The patient is alert and oriented x 3.  SKIN: No obvious rash, lesion, or ulcer.    LABORATORY PANEL:   CBC Recent Labs  Lab 08/08/17 0357  WBC 7.0  HGB 12.2  HCT 36.2  PLT 214   ------------------------------------------------------------------------------------------------------------------  Chemistries  Recent Labs  Lab 08/07/17 1905 08/08/17 0357  NA 135 137  K 3.4* 3.6  CL 106 109  CO2 20* 19*  GLUCOSE 118* 145*  BUN 11 10  CREATININE 0.59 0.65  CALCIUM 8.0* 8.0*  AST 71*  --   ALT 58*  --   ALKPHOS 153*  --   BILITOT 0.6  --    ------------------------------------------------------------------------------------------------------------------  Cardiac Enzymes Recent Labs  Lab 08/07/17 1905  TROPONINI <0.03   ------------------------------------------------------------------------------------------------------------------  RADIOLOGY:  No results found.  EKG:   Orders placed or performed during the hospital encounter of 08/07/17  . ED EKG 12-Lead  . ED EKG 12-Lead    ASSESSMENT AND PLAN:   #1 healthcare associated pneumonia in the right lung: Continue IV antibiotics.  With cefepime.  Just discharged from hospital a day ago on 27 February.  MRSA screen negative.  So we discontinued vancomycin.  Continue cefepime. 2.  Acute metabolic encephalopathy secondary to pneumonia, dementia: Clearing up.  She is alert, eating her breakfast.   3.  Hypertension: Controlled. 4.  History of multiple myeloma.,  Chronic pain: Continue fentanyl patch 25 mcg every 72 hours.  #5 generalized weakness: Patient had workup with MRI of the brain, seen by  neurology.  Thought to be due to dehydration.  Patient is at high risk for falls.  Physical therapy recommended home health services with the last discharge.  Physical therapy consult again.   again.  Likely discharge tomorrow. 6.  Proximal atrial fibrillation: Rate controlled.  Continue aspirin for anticoagulation.  All the records are reviewed and case discussed with Care Management/Social Workerr. Management plans discussed with the patient, family and they are in agreement.  CODE STATUS: DNR  TOTAL TIME TAKING CARE OF THIS PATIENT: 26mnutes.   POSSIBLE D/C IN 1-2 DAYS, DEPENDING ON CLINICAL CONDITION.   SEpifanio LeschesM.D on 08/10/2017 at 9:47 AM  Between 7am to 6pm - Pager - [phone]  After 6pm go to [email or web address] - password [email or web address]  Office  [phone] CC: Primary care physician; MLangley GaussPrimary Care   Note: This dictation was prepared with Dragon dictation along with smaller phrase technology. Any transcriptional errors that result from this process are unintentional.

## 2017-08-11 LAB — GLUCOSE, CAPILLARY: GLUCOSE-CAPILLARY: 97 mg/dL (ref 65–99)

## 2017-08-11 MED ORDER — FENTANYL 25 MCG/HR TD PT72: 25.0000 ug | MEDICATED_PATCH | TRANSDERMAL | 0 refills | Status: DC

## 2017-08-11 MED ORDER — AMOXICILLIN-POT CLAVULANATE 875-125 MG PO TABS: 1.0000 | ORAL_TABLET | Freq: Two times a day (BID) | ORAL | 0 refills | Status: AC

## 2017-08-11 MED ORDER — BISACODYL 5 MG PO TBEC: 5.0000 mg | DELAYED_RELEASE_TABLET | Freq: Every day | ORAL | 0 refills | Status: DC | PRN

## 2017-08-11 MED ORDER — FENTANYL 25 MCG/HR TD PT72
25.0000 ug | MEDICATED_PATCH | TRANSDERMAL | 0 refills | Status: DC
Start: 2017-08-11 — End: 2017-09-16

## 2017-08-11 NOTE — NC FL2 (Signed)
[place] LEVEL OF CARE SCREENING TOOL     IDENTIFICATION  Patient Name: [name] Birthdate: [birth year] Sex: female Admission Date (Current Location): 08/07/2017  [place] and [place] Number:  Engineering geologist and Address:  [facility], [address] [place], [place] [address]      Provider Number: [id number]  Attending Physician Name and Address:  Epifanio Lesches, MD  Relative Name and Phone Number:       Current Level of Care: Hospital Recommended Level of Care: [place] Prior Approval Number:    Date Approved/Denied:   PASRR Number: [id number])  Discharge Plan: SNF    Current Diagnoses: Patient Active Problem List   Diagnosis Date Noted  . HCAP (healthcare-associated pneumonia) 08/07/2017  . Left leg weakness 08/04/2017  . Dehydration 02/19/2017  . Pain in right hip 12/28/2015  . Pain in lower back 12/28/2015  . Rheumatoid arthritis ([place]) 03/29/2015  . Inflammatory polyarthropathy ([place]) 03/29/2015  . Multiple myeloma not having achieved remission ([place]) 10/29/2014  . Absolute anemia 10/24/2014  . CAD in native artery 10/24/2014  . Benign essential HTN 10/24/2014  . MI (mitral incompetence) 05/25/2014  . AF (paroxysmal atrial fibrillation) ([place]) 05/25/2014  . Paroxysmal atrial fibrillation ([facility]) 05/25/2014    Orientation RESPIRATION BLADDER Height & Weight     Self, Place  Normal Continent Weight: 110 lb 11.2 oz (50.2 kg) Height:  5' (152.4 cm)  BEHAVIORAL SYMPTOMS/MOOD NEUROLOGICAL BOWEL NUTRITION STATUS      Continent Diet(Diet: Heart Healthy )  AMBULATORY STATUS COMMUNICATION OF NEEDS Skin   Extensive Assist Verbally Normal                       Personal Care Assistance Level of Assistance  Bathing, Feeding, Dressing Bathing Assistance: Limited assistance Feeding assistance: Independent Dressing Assistance: Limited assistance     Functional Limitations Info  Sight,  Hearing, Speech Sight Info: Adequate Hearing Info: Adequate Speech Info: Adequate    SPECIAL CARE FACTORS FREQUENCY  PT (By licensed PT), OT (By licensed OT)     PT Frequency: (5) OT Frequency: (5)            Contractures      Additional Factors Info  Code Status, Allergies Code Status Info: (DNR ) Allergies Info: (Biaxin Clarithromycin, Demerol Meperidine, Hydrocodone, Sulfa Antibiotics, Tramadol, Ultracet Tramadol-acetaminophen)           Current Medications (08/11/2017):  This is the current hospital active medication list Current Facility-Administered Medications  Medication Dose Route Frequency Provider Last Rate Last Dose  . acetaminophen (TYLENOL) tablet 650 mg  650 mg Oral Q6H PRN Amelia Jo, MD   650 mg at 08/10/17 0827   Or  . acetaminophen (TYLENOL) suppository 650 mg  650 mg Rectal Q6H PRN Amelia Jo, MD      . acyclovir (ZOVIRAX) 200 MG capsule 400 mg  400 mg Oral QHS Amelia Jo, MD   400 mg at 08/10/17 2207  . aspirin EC tablet 81 mg  81 mg Oral Daily Amelia Jo, MD   81 mg at 08/10/17 0827  . bisacodyl (DULCOLAX) EC tablet 5 mg  5 mg Oral Daily PRN Amelia Jo, MD      . ceFEPIme (MAXIPIME) 2 g in sodium chloride 0.9 % 100 mL IVPB  2 g Intravenous Q12H Coffee, Donna Christen, [facility]   Stopped at 08/10/17 2302  . divalproex (DEPAKOTE) DR tablet 125 mg  125 mg Oral Daily Amelia Jo, MD  125 mg at 08/10/17 0827  . docusate sodium (COLACE) capsule 100 mg  100 mg Oral BID Amelia Jo, MD   100 mg at 08/10/17 2205  . fentaNYL (DURAGESIC - dosed mcg/hr) patch 25 mcg  25 mcg Transdermal Q72H Amelia Jo, MD   25 mcg at 08/10/17 2216  . heparin injection 5,000 Units  5,000 Units Subcutaneous Q8H Amelia Jo, MD   5,000 Units at 08/11/17 0448  . HYDROcodone-acetaminophen (NORCO/VICODIN) 5-325 MG per tablet 1-2 tablet  1-2 tablet Oral Q4H PRN Amelia Jo, MD   2 tablet at 08/11/17 0118  . lidocaine-prilocaine (EMLA) cream 1 application  1 application  Topical PRN Amelia Jo, MD      . megestrol (MEGACE) tablet 40 mg  40 mg Oral BID Amelia Jo, MD   40 mg at 08/10/17 2206  . mirtazapine (REMERON) tablet 30 mg  30 mg Oral QHS Amelia Jo, MD   30 mg at 08/10/17 2205  . ondansetron (ZOFRAN) tablet 4 mg  4 mg Oral Q6H PRN Amelia Jo, MD       Or  . ondansetron [facility]) injection 4 mg  4 mg Intravenous Q6H PRN Amelia Jo, MD      . polyethylene glycol (MIRALAX / GLYCOLAX) packet 17 g  17 g Oral QHS PRN Amelia Jo, MD      . risperiDONE (RISPERDAL) tablet 1 mg  1 mg Oral QHS Amelia Jo, MD   1 mg at 08/10/17 2206  . traZODone (DESYREL) tablet 25 mg  25 mg Oral QHS PRN Amelia Jo, MD   25 mg at 08/10/17 2321   Facility-Administered Medications Ordered in Other Encounters  Medication Dose Route Frequency Provider Last Rate Last Dose  . sodium chloride 0.9 % injection 10 mL  10 mL Intravenous PRN Forest Gleason, MD   10 mL at 03/01/15 1305     Discharge Medications: Please see discharge summary for a list of discharge medications.  Relevant Imaging Results:  Relevant Lab Results:   Additional Information (SSN: [id number])  [place] Perry, Veronia Beets, LCSW

## 2017-08-11 NOTE — Discharge Summary (Signed)
[name], is a 84 y.o. female  DOB 08/23/1924  MRN [id number].  Admission date:  08/07/2017  Admitting Physician  Amelia Jo, MD  Discharge Date:  08/11/2017   Primary MD  [facility]  Recommendations for primary care physician for things to follow:  Follow-up 1 week   Admission Diagnosis  Weakness [R53.1] Healthcare-associated pneumonia [J18.9] Sepsis ([place]) [A41.9]   Discharge Diagnosis  Weakness [R53.1] Healthcare-associated pneumonia [J18.9] Sepsis ([place]) [A41.9]    Active Problems:   HCAP (healthcare-associated pneumonia)      Past Medical History:  Diagnosis Date  . Anxiety   . Arthritis   . Atrial fibrillation ([place])   . Coronary arteriosclerosis   . History of blood transfusion 2014  . Hyperlipidemia   . Hypertension   . Multiple myeloma ([place])   . Multiple myeloma in relapse ([place]) 10/29/2014  . 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 ABDOMINAL HYSTERECTOMY    . APPENDECTOMY    . PARTIAL COLECTOMY    . TOTAL KNEE REVISION Left        History of present illness and  Hospital Course:     Kindly see H&P for history of present illness and admission details, please review complete Labs, Consult reports and Test reports for all details in brief  HPI  from the history and physical done on the day of admission 84 year old female patient admitted for weakness and found to have healthcare associated pneumonia with sepsis after experiencing confusion, fever.  Discharged from the hospital the day before after she had a weakness, dehydration.  Patient lives alone in a [place] home.   Hospital Course   #1 sepsis present on admission secondary to healthcare associated pneumonia.  Discharged from hospital recently.  Received  vancomycin, cefepime, vancomycin discontinued after her MRSA screen negative.  Patient received cefepime for 5 days.  Initial pro calcitonin level up to 0.20.  Decreased to 0.12.  Patient had normal white count.  Will be discharged with Augmentin 875/125 mg p.o. twice daily for 7 days. 2.  Deconditioning: Has mild Alzheimer's dementia.  Patient is oriented to place and self.  Some confusion noted on and off.  Patient on Depakote, Risperdal, mirtazapine  physical therapy recommended SNF  Patient stable for discharge. 3 essential hypertension: Controlled 4.  History of multiple myeloma, chronic pain.  Patient on low-dose fentanyl patch. 5.  History of proximal atrial fibrillation: Rate controlled.  Continue aspirin for anticoagulation. Palliative care team to follow at nursing home.  Discharge Condition: Stable   Follow UP   Contact information for follow-up providers    [place], [facility]. Schedule an appointment as soon as possible for a visit in 1 week(s).   Contact information: [place] [address] [phone]            Contact information for after-discharge care    Destination    [facility] .   Service:  Skilled Nursing Contact information: [address] [place] [phone]                    Discharge Instructions  and  Discharge Medications      Allergies as of 08/11/2017      Reactions   Biaxin [clarithromycin] Diarrhea, Nausea And Vomiting   Demerol [meperidine] Nausea And Vomiting   Hydrocodone Nausea And Vomiting   Sulfa Antibiotics  Hives   Tramadol Diarrhea, Nausea And Vomiting   Ultracet [tramadol-acetaminophen] Other (See Comments)      Medication List    STOP taking these medications   ondansetron 4 MG tablet Commonly known as:  ZOFRAN   oxyCODONE 5 MG immediate release tablet Commonly known as:  Oxy IR/ROXICODONE     TAKE these medications   acyclovir 400 MG  tablet Commonly known as:  ZOVIRAX TAKE 1 TABLET BY MOUTH ONCE DAILY TO  PREVENT  SHINGLES What changed:    how much to take  how to take this  when to take this  additional instructions   amoxicillin-clavulanate 875-125 MG tablet Commonly known as:  AUGMENTIN Take 1 tablet by mouth 2 (two) times daily for 14 days.   aspirin EC 81 MG tablet Take 1 tablet (81 mg total) by mouth daily.   bisacodyl 5 MG EC tablet Commonly known as:  DULCOLAX Take 1 tablet (5 mg total) by mouth daily as needed for moderate constipation.   COLACE PO Take 2 tablets by mouth at bedtime.   dexamethasone 4 MG tablet Commonly known as:  DECADRON Take 1 tablet (4 mg total) by mouth 2 (two) times daily. Start 2 days prior to infusion; Take it for 4 days.   divalproex 125 MG DR tablet Commonly known as:  DEPAKOTE Take 1 tablet by mouth daily.   fentaNYL 25 MCG/HR patch Commonly known as:  DURAGESIC - dosed mcg/hr Place 1 patch (25 mcg total) onto the skin every 3 (three) days.   lidocaine-prilocaine cream Commonly known as:  EMLA Apply 1 application topically as needed. Apply small amount to port site at least 1 hour prior to it being accessed, cover with plastic wrap   megestrol 40 MG tablet Commonly known as:  MEGACE Take 1 tablet (40 mg total) by mouth 2 (two) times daily.   mirtazapine 30 MG tablet Commonly known as:  REMERON TAKE 1 TABLET BY MOUTH AT BEDTIME   polyethylene glycol packet Commonly known as:  MIRALAX / GLYCOLAX Take 17 g by mouth at bedtime as needed for mild constipation.   risperiDONE 1 MG tablet Commonly known as:  RISPERDAL Take 1 tablet (1 mg total) by mouth at bedtime.         Diet and Activity recommendation: See Discharge Instructions above   Consults obtained -physical therapy   Major procedures and Radiology Reports - PLEASE review detailed and final reports for all details, in brief -     Dg Chest 2 View  Result Date: 08/07/2017 CLINICAL  DATA:  Sepsis. EXAM: CHEST  2 VIEW COMPARISON:  Chest x-ray dated August 04, 2017. FINDINGS: Unchanged right chest wall port catheter with tip in the proximal right atrium. The heart size and mediastinal contours are within normal limits. Normal pulmonary vascularity. There is new patchy airspace disease in the right middle and lower lobes. No pleural effusion or pneumothorax. No acute osseous abnormality. IMPRESSION: New right middle and lower lobe pneumonia. Electronically Signed   By: Titus Dubin M.D.   On: 08/07/2017 19:41   Dg Chest 2 View  Result Date: 08/04/2017 CLINICAL DATA:  Weakness EXAM: CHEST  2 VIEW COMPARISON:  06/12/2016 FINDINGS: Right Port-A-Cath remains in place, unchanged. There is hyperinflation of the lungs compatible with COPD. Heart is normal size. No confluent airspace opacities or effusions. No acute bony abnormality. IMPRESSION: COPD.  No active disease. Electronically Signed   By: Rolm Baptise M.D.   On: 08/04/2017 20:11   Ct  Head Wo Contrast  Result Date: 08/04/2017 CLINICAL DATA:  Altered level of consciousness, confusion EXAM: CT HEAD WITHOUT CONTRAST TECHNIQUE: Contiguous axial images were obtained from the base of the skull through the vertex without intravenous contrast. COMPARISON:  08/04/2014 FINDINGS: Brain: There is atrophy and chronic small vessel disease changes. Old right basal ganglia lacunar infarct. No acute intracranial abnormality. Specifically, no hemorrhage, hydrocephalus, mass lesion, acute infarction, or significant intracranial injury. Vascular: No hyperdense vessel or unexpected calcification. Skull: No acute calvarial abnormality. Sinuses/Orbits: Visualized paranasal sinuses and mastoids clear. Orbital soft tissues unremarkable. Other: None IMPRESSION: No acute intracranial abnormality. Atrophy, chronic microvascular disease. Old right basal ganglia lacunar infarct. Electronically Signed   By: Rolm Baptise M.D.   On: 08/04/2017 19:58   Mr Brain Wo  Contrast  Result Date: 08/05/2017 CLINICAL DATA:  84 year old female with left lower extremity weakness and confusion for 24 hours. EXAM: MRI HEAD WITHOUT CONTRAST MRA HEAD WITHOUT CONTRAST TECHNIQUE: Multiplanar, multiecho pulse sequences of the brain and surrounding structures were obtained without intravenous contrast. Angiographic images of the head were obtained using MRA technique without contrast. COMPARISON:  Head CT without contrast 08/04/2017 and earlier. Brain MRI 07/21/2014. FINDINGS: MRI HEAD FINDINGS Brain: Cerebral volume is stable since 2016. No restricted diffusion or evidence of acute infarction. A chronic lacunar infarct in the central right thalamus is new since 2016. Stable T2 heterogeneity elsewhere throughout the deep gray matter nuclei which appears mostly due to perivascular spaces. Mild for age T2 heterogeneity in the pons is stable since 2016. Scattered Patchy and confluent bilateral cerebral white matter T2 and FLAIR hyperintensity has not significantly changed since 2016. No cortical encephalomalacia or chronic cerebral blood products identified. No midline shift, mass effect, evidence of mass lesion, ventriculomegaly, extra-axial collection or acute intracranial hemorrhage. Cervicomedullary junction and pituitary are within normal limits. Vascular: Major intracranial vascular flow voids are stable since 2016. Skull and upper cervical spine: Negative for age visible cervical spine. Visualized bone marrow signal is within normal limits. Sinuses/Orbits: Stable and negative. Other: Visible internal auditory structures appear normal. Mastoid air cells remain clear. Scalp and face soft tissues appear negative. MRA HEAD FINDINGS Antegrade flow in the posterior circulation with no distal vertebral artery stenosis. Mildly dominant left vertebral artery. Normal left PICA origin. Normal vertebrobasilar junction. No basilar stenosis. AICA and SCA origins appear patent. Bilateral PCA origins are  within normal limits. Posterior communicating arteries are diminutive or absent. There is bilateral moderate to severe distal P1/P2 segment PCA stenoses (series 107). MIP images also suggest severe distal right P2 stenosis, although on source images the other right PCA branches appear within normal limits. Antegrade flow in both ICA siphons. No siphon stenosis. Normal ophthalmic artery origins. Patent carotid termini. Normal MCA and ACA origins. The anterior communicating artery is diminutive or absent. There is mild to moderate stenosis of the right ACA A2 segment (series 105, image 8), but preserved distal flow. The left MCA M1 segment is patent without stenosis. The left MCA bifurcation is patent. Questionable moderate to severe stenosis in the dominant posterior left MCA branch (might be artifact). Otherwise the visible left MCA branches appear within normal limits. The right MCA M1 segment is patent with an early bifurcation. The right MCA bifurcation is patent. The major right MCA scratched at the visible right MCA branches are patent. There is mild to moderate stenosis in the proximal aspect of the dominant right MCA M2 branch (series 105, image 12). IMPRESSION: 1. No acute infarct or acute  intracranial abnormality is identified. 2. No intracranial large vessel occlusion, but intracranial MRA is remarkable for widespread moderate and severe 2nd order circle-of-Willis branch atherosclerosis and stenosis, including moderate stenosis of the dominant right MCA posterior M2 branch. 3. Sequelae of chronic small vessel disease in the brain is only mildly progressed since 2016. Electronically Signed   By: Genevie Ann M.D.   On: 08/05/2017 13:00   US Carotid Bilateral (at [facility] Only)  Result Date: 08/05/2017 CLINICAL DATA:  84 year old female with a history of left leg weakness. Cardiovascular risk factors include hypertension EXAM: BILATERAL CAROTID DUPLEX ULTRASOUND TECHNIQUE: Pearline Cables scale imaging, color Doppler  and duplex ultrasound were performed of bilateral carotid and vertebral arteries in the neck. COMPARISON:  No prior duplex FINDINGS: Criteria: Quantification of carotid stenosis is based on velocity parameters that correlate the residual internal carotid diameter with NASCET-based stenosis levels, using the diameter of the distal internal carotid lumen as the denominator for stenosis measurement. The following velocity measurements were obtained: RIGHT ICA:  Systolic 86 cm/sec, Diastolic 13 cm/sec CCA:  95 cm/sec SYSTOLIC ICA/CCA RATIO:  0.9 ECA:  114 cm/sec LEFT ICA:  Systolic 93 cm/sec, Diastolic 21 cm/sec CCA:  578 cm/sec SYSTOLIC ICA/CCA RATIO:  0.9 ECA:  107 cm/sec Right Brachial SBP: Not acquired Left Brachial SBP: Not acquired RIGHT CAROTID ARTERY: No significant calcifications of the right common carotid artery. Intermediate waveform maintained. Heterogeneous and partially calcified plaque at the right carotid bifurcation. No significant lumen shadowing. Low resistance waveform of the right ICA. No significant tortuosity. RIGHT VERTEBRAL ARTERY: Antegrade flow with low resistance waveform. LEFT CAROTID ARTERY: No significant calcifications of the left common carotid artery. Intermediate waveform maintained. Heterogeneous and partially calcified plaque at the left carotid bifurcation without significant lumen shadowing. Low resistance waveform of the left ICA. No significant tortuosity. LEFT VERTEBRAL ARTERY:  Antegrade flow with low resistance waveform. IMPRESSION: Color duplex indicates minimal heterogeneous and calcified plaque, with no hemodynamically significant stenosis by duplex criteria in the extracranial cerebrovascular circulation. Signed, Dulcy Fanny. Earleen Newport, DO [facility] Radiology Electronically Signed   By: Corrie Mckusick D.O.   On: 08/05/2017 13:27   Mr Jodene Nam Head/brain IO Cm  Result Date: 08/05/2017 CLINICAL DATA:  84 year old female with left  lower extremity weakness and confusion for 24 hours. EXAM: MRI HEAD WITHOUT CONTRAST MRA HEAD WITHOUT CONTRAST TECHNIQUE: Multiplanar, multiecho pulse sequences of the brain and surrounding structures were obtained without intravenous contrast. Angiographic images of the head were obtained using MRA technique without contrast. COMPARISON:  Head CT without contrast 08/04/2017 and earlier. Brain MRI 07/21/2014. FINDINGS: MRI HEAD FINDINGS Brain: Cerebral volume is stable since 2016. No restricted diffusion or evidence of acute infarction. A chronic lacunar infarct in the central right thalamus is new since 2016. Stable T2 heterogeneity elsewhere throughout the deep gray matter nuclei which appears mostly due to perivascular spaces. Mild for age T2 heterogeneity in the pons is stable since 2016. Scattered Patchy and confluent bilateral cerebral white matter T2 and FLAIR hyperintensity has not significantly changed since 2016. No cortical encephalomalacia or chronic cerebral blood products identified. No midline shift, mass effect, evidence of mass lesion, ventriculomegaly, extra-axial collection or acute intracranial hemorrhage. Cervicomedullary junction and pituitary are within normal limits. Vascular: Major intracranial vascular flow voids are stable since 2016. Skull and upper cervical spine: Negative for age visible cervical spine. Visualized bone marrow signal is within normal limits. Sinuses/Orbits: Stable and negative. Other: Visible internal auditory structures appear normal.  Mastoid air cells remain clear. Scalp and face soft tissues appear negative. MRA HEAD FINDINGS Antegrade flow in the posterior circulation with no distal vertebral artery stenosis. Mildly dominant left vertebral artery. Normal left PICA origin. Normal vertebrobasilar junction. No basilar stenosis. AICA and SCA origins appear patent. Bilateral PCA origins are within normal limits. Posterior communicating arteries are diminutive or absent.  There is bilateral moderate to severe distal P1/P2 segment PCA stenoses (series 107). MIP images also suggest severe distal right P2 stenosis, although on source images the other right PCA branches appear within normal limits. Antegrade flow in both ICA siphons. No siphon stenosis. Normal ophthalmic artery origins. Patent carotid termini. Normal MCA and ACA origins. The anterior communicating artery is diminutive or absent. There is mild to moderate stenosis of the right ACA A2 segment (series 105, image 8), but preserved distal flow. The left MCA M1 segment is patent without stenosis. The left MCA bifurcation is patent. Questionable moderate to severe stenosis in the dominant posterior left MCA branch (might be artifact). Otherwise the visible left MCA branches appear within normal limits. The right MCA M1 segment is patent with an early bifurcation. The right MCA bifurcation is patent. The major right MCA scratched at the visible right MCA branches are patent. There is mild to moderate stenosis in the proximal aspect of the dominant right MCA M2 branch (series 105, image 12). IMPRESSION: 1. No acute infarct or acute intracranial abnormality is identified. 2. No intracranial large vessel occlusion, but intracranial MRA is remarkable for widespread moderate and severe 2nd order circle-of-Willis branch atherosclerosis and stenosis, including moderate stenosis of the dominant right MCA posterior M2 branch. 3. Sequelae of chronic small vessel disease in the brain is only mildly progressed since 2016. Electronically Signed   By: Genevie Ann M.D.   On: 08/05/2017 13:00    Micro Results     Recent Results (from the past 240 hour(s))  Blood Culture (routine x 2)     Status: None (Preliminary result)   Collection Time: 08/07/17  7:05 PM  Result Value Ref Range Status   Specimen Description BLOOD LAC  Final   Special Requests   Final    BOTTLES DRAWN AEROBIC AND ANAEROBIC Blood Culture adequate volume   Culture    Final    NO GROWTH 4 DAYS Performed at [facility], [address] [place], [place] [address]    Report Status PENDING  Incomplete  Urine culture     Status: Abnormal   Collection Time: 08/07/17  7:11 PM  Result Value Ref Range Status   Specimen Description   Final    URINE, RANDOM Performed at [facility], [address] [place], [place] [address]    Special Requests   Final    NONE Performed at [facility], [address] [place], [place] [address]    Culture (A)  Final    <10,000 COLONIES/mL INSIGNIFICANT GROWTH Performed at [facility], [place] [address] [place], [place] [address]    Report Status 08/09/2017 FINAL  Final  Blood Culture (routine x 2)     Status: None (Preliminary result)   Collection Time: 08/07/17  7:16 PM  Result Value Ref Range Status   Specimen Description BLOOD BLOOD LEFT WRIST  Final   Special Requests   Final    BOTTLES DRAWN AEROBIC AND ANAEROBIC Blood Culture adequate volume   Culture   Final    NO GROWTH 4 DAYS Performed at [facility],  [place], [place] [address]    Report Status PENDING  Incomplete  MRSA PCR Screening     Status: None   Collection Time: 08/07/17  8:18 PM  Result Value Ref Range Status   MRSA by PCR NEGATIVE NEGATIVE Final    Comment:        The GeneXpert MRSA Assay (FDA approved for NASAL specimens only), is one component of a comprehensive MRSA colonization surveillance program. It is not intended to diagnose MRSA infection nor to guide or monitor treatment for MRSA infections. Performed at [facility], [address] [place], [place] [address]        Today   Subjective:   [name] stable for discharge today.  Objective:   Blood pressure 136/70, pulse 92, temperature 98 F (36.7 C), temperature source Oral, resp. rate 18, height 5' (1.524 m), weight 50.2 kg (110 lb 11.2 oz), SpO2 96 %.   Intake/Output Summary (Last 24  hours) at 08/11/2017 1105 Last data filed at 08/11/2017 1034 Gross per 24 hour  Intake 360 ml  Output -  Net 360 ml    Exam Seen at bedside.  Alert to self and place. [place].AT,PERRAL Supple Neck,No JVD, No cervical lymphadenopathy appriciated.  Symmetrical Chest wall movement, Good air movement bilaterally, CTAB RRR,No Gallops,Rubs or new Murmurs, No Parasternal Heave +ve B.Sounds, Abd Soft, Non tender, No organomegaly appriciated, No rebound -guarding or rigidity. No Cyanosis, Clubbing or edema, No new Rash or bruise  Data Review   CBC w Diff:  Lab Results  Component Value Date   WBC 7.0 08/08/2017   HGB 12.2 08/08/2017   HGB 12.4 09/19/2014   HCT 36.2 08/08/2017   HCT 38.0 09/19/2014   PLT 214 08/08/2017   PLT 288 09/19/2014   LYMPHOPCT 9 08/07/2017   LYMPHOPCT 31.1 09/19/2014   MONOPCT 13 08/07/2017   MONOPCT 15.6 09/19/2014   EOSPCT 0 08/07/2017   EOSPCT 1.2 09/19/2014   BASOPCT 0 08/07/2017   BASOPCT 3.6 09/19/2014    CMP:  Lab Results  Component Value Date   NA 137 08/08/2017   NA 138 09/19/2014   K 3.6 08/08/2017   K 4.0 09/19/2014   CL 109 08/08/2017   CL 103 09/19/2014   CO2 19 (L) 08/08/2017   CO2 29 09/19/2014   BUN 10 08/08/2017   BUN 15 09/19/2014   CREATININE 0.65 08/08/2017   CREATININE 0.69 09/19/2014   PROT 6.8 08/07/2017   PROT 6.8 09/19/2014   ALBUMIN 3.1 (L) 08/07/2017   ALBUMIN 3.9 09/19/2014   BILITOT 0.6 08/07/2017   BILITOT 0.6 09/19/2014   ALKPHOS 153 (H) 08/07/2017   ALKPHOS 85 09/19/2014   AST 71 (H) 08/07/2017   AST 20 09/19/2014   ALT 58 (H) 08/07/2017   ALT 16 09/19/2014  .   Total Time in preparing paper work, data evaluation and todays exam - 35 minutes  Epifanio Lesches M.D on 08/11/2017 at 11:05 AM    Note: This dictation was prepared with Dragon dictation along with smaller phrase technology. Any transcriptional errors that result from this process are unintentional.

## 2017-08-11 NOTE — Progress Notes (Signed)
Patient is currently followed by out patient PALLIATIVE at home. Per discussion with CSW Mel Almond Sample patient will be discharging to [facility] today. Out patient Palliative to continue to follow. Updated notes faxed to referral. Flo Shanks RN, BSN, [place] [facility] [place], hospital liison [phone]

## 2017-08-11 NOTE — Discharge Summary (Signed)
[name], is a 84 y.o. female  DOB 06/02/1925  MRN [id number].  Admission date:  08/07/2017  Admitting Physician  Amelia Jo, MD  Discharge Date:  08/11/2017   Primary MD  [facility]  Recommendations for primary care physician for things to follow:  Follow-up 1 week   Admission Diagnosis  Weakness [R53.1] Healthcare-associated pneumonia [J18.9] Sepsis ([place]) [A41.9]   Discharge Diagnosis  Weakness [R53.1] Healthcare-associated pneumonia [J18.9] Sepsis ([place]) [A41.9]    Active Problems:   HCAP (healthcare-associated pneumonia)      Past Medical History:  Diagnosis Date  . Anxiety   . Arthritis   . Atrial fibrillation ([place])   . Coronary arteriosclerosis   . History of blood transfusion 2014  . Hyperlipidemia   . Hypertension   . Multiple myeloma ([place])   . Multiple myeloma in relapse ([place]) 10/29/2014  . 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 ABDOMINAL HYSTERECTOMY    . APPENDECTOMY    . PARTIAL COLECTOMY    . TOTAL KNEE REVISION Left        History of present illness and  Hospital Course:     Kindly see H&P for history of present illness and admission details, please review complete Labs, Consult reports and Test reports for all details in brief  HPI  from the history and physical done on the day of admission 84 year old female patient admitted for weakness and found to have healthcare associated pneumonia with sepsis after experiencing confusion, fever.  Discharged from the hospital the day before after she had a weakness, dehydration.  Patient lives alone in a [facility] home.   Hospital Course   #1 sepsis present on admission secondary to healthcare associated pneumonia.  Discharged from hospital recently.  Received  vancomycin, cefepime, vancomycin discontinued after her MRSA screen negative.  Patient received cefepime for 5 days.  Initial pro calcitonin level up to 0.20.  Decreased to 0.12.  Patient had normal white count.  Will be discharged with Augmentin 875/125 mg p.o. twice daily for 7 days. 2.  Deconditioning: Has mild Alzheimer's dementia.  Patient is oriented to place and self.  Some confusion noted on and off.  Patient on Depakote, Risperdal, mirtazapine  physical therapy recommended SNF  Patient stable for discharge. 3 essential hypertension: Controlled 4.  History of multiple myeloma, chronic pain.  Patient on low-dose fentanyl patch. 5.  History of proximal atrial fibrillation: Rate controlled.  Continue aspirin for anticoagulation.   Discharge Condition: Stable   Follow UP      Discharge Instructions  and  Discharge Medications      Allergies as of 08/11/2017      Reactions   Biaxin [clarithromycin] Diarrhea, Nausea And Vomiting   Demerol [meperidine] Nausea And Vomiting   Hydrocodone Nausea And Vomiting   Sulfa Antibiotics Hives   Tramadol Diarrhea, Nausea And Vomiting   Ultracet [tramadol-acetaminophen] Other (See Comments)      Medication List    STOP taking these medications   ondansetron 4 MG tablet Commonly known as:  ZOFRAN   oxyCODONE 5 MG immediate release tablet Commonly known as:  Oxy IR/ROXICODONE     TAKE these medications   acyclovir 400 MG tablet Commonly known as:  ZOVIRAX TAKE 1 TABLET BY MOUTH ONCE DAILY TO  PREVENT  SHINGLES What changed:    how much to take  how to take this  when to take this  additional instructions   amoxicillin-clavulanate 875-125 MG tablet Commonly known as:  AUGMENTIN Take 1 tablet by mouth 2 (two) times daily for 14 days.   aspirin EC 81 MG tablet Take 1 tablet (81 mg total) by mouth daily.   bisacodyl 5 MG EC tablet Commonly known as:  DULCOLAX Take 1 tablet (5 mg total) by mouth daily as needed for moderate  constipation.   COLACE PO Take 2 tablets by mouth at bedtime.   dexamethasone 4 MG tablet Commonly known as:  DECADRON Take 1 tablet (4 mg total) by mouth 2 (two) times daily. Start 2 days prior to infusion; Take it for 4 days.   divalproex 125 MG DR tablet Commonly known as:  DEPAKOTE Take 1 tablet by mouth daily.   fentaNYL 25 MCG/HR patch Commonly known as:  DURAGESIC - dosed mcg/hr Place 1 patch (25 mcg total) onto the skin every 3 (three) days.   lidocaine-prilocaine cream Commonly known as:  EMLA Apply 1 application topically as needed. Apply small amount to port site at least 1 hour prior to it being accessed, cover with plastic wrap   megestrol 40 MG tablet Commonly known as:  MEGACE Take 1 tablet (40 mg total) by mouth 2 (two) times daily.   mirtazapine 30 MG tablet Commonly known as:  REMERON TAKE 1 TABLET BY MOUTH AT BEDTIME   polyethylene glycol packet Commonly known as:  MIRALAX / GLYCOLAX Take 17 g by mouth at bedtime as needed for mild constipation.   risperiDONE 1 MG tablet Commonly known as:  RISPERDAL Take 1 tablet (1 mg total) by mouth at bedtime.         Diet and Activity recommendation: See Discharge Instructions above   Consults obtained -physical therapy   Major procedures and Radiology Reports - PLEASE review detailed and final reports for all details, in brief -     Dg Chest 2 View  Result Date: 08/07/2017 CLINICAL DATA:  Sepsis. EXAM: CHEST  2 VIEW COMPARISON:  Chest x-ray dated August 04, 2017. FINDINGS: Unchanged right chest wall port catheter with tip in the proximal right atrium. The heart size and mediastinal contours are within normal limits. Normal pulmonary vascularity. There is new patchy airspace disease in the right middle and lower lobes. No pleural effusion or pneumothorax. No acute osseous abnormality. IMPRESSION: New right middle and lower lobe pneumonia. Electronically Signed   By: Titus Dubin M.D.   On: 08/07/2017  19:41   Dg Chest 2 View  Result Date: 08/04/2017 CLINICAL DATA:  Weakness EXAM: CHEST  2 VIEW COMPARISON:  06/12/2016 FINDINGS: Right Port-A-Cath remains in place, unchanged. There is hyperinflation of the lungs compatible with COPD. Heart is normal size. No confluent airspace opacities or effusions. No acute bony abnormality. IMPRESSION: COPD.  No active disease. Electronically Signed   By: Rolm Baptise M.D.   On: 08/04/2017 20:11   Ct Head Wo Contrast  Result Date: 08/04/2017 CLINICAL DATA:  Altered level of consciousness, confusion EXAM: CT HEAD WITHOUT CONTRAST TECHNIQUE: Contiguous axial images were obtained from the base of the skull through the vertex without intravenous contrast. COMPARISON:  08/04/2014 FINDINGS: Brain: There is atrophy and chronic small vessel disease changes. Old right basal ganglia lacunar infarct. No acute intracranial abnormality. Specifically, no hemorrhage, hydrocephalus, mass lesion, acute infarction, or significant intracranial injury. Vascular: No hyperdense vessel or unexpected calcification. Skull: No acute calvarial abnormality. Sinuses/Orbits: Visualized paranasal sinuses and mastoids clear. Orbital soft tissues unremarkable. Other: None IMPRESSION: No acute intracranial abnormality.  Atrophy, chronic microvascular disease. Old right basal ganglia lacunar infarct. Electronically Signed   By: Rolm Baptise M.D.   On: 08/04/2017 19:58   Mr Brain Wo Contrast  Result Date: 08/05/2017 CLINICAL DATA:  84 year old female with left lower extremity weakness and confusion for 24 hours. EXAM: MRI HEAD WITHOUT CONTRAST MRA HEAD WITHOUT CONTRAST TECHNIQUE: Multiplanar, multiecho pulse sequences of the brain and surrounding structures were obtained without intravenous contrast. Angiographic images of the head were obtained using MRA technique without contrast. COMPARISON:  Head CT without contrast 08/04/2017 and earlier. Brain MRI 07/21/2014. FINDINGS: MRI HEAD FINDINGS Brain:  Cerebral volume is stable since 2016. No restricted diffusion or evidence of acute infarction. A chronic lacunar infarct in the central right thalamus is new since 2016. Stable T2 heterogeneity elsewhere throughout the deep gray matter nuclei which appears mostly due to perivascular spaces. Mild for age T2 heterogeneity in the pons is stable since 2016. Scattered Patchy and confluent bilateral cerebral white matter T2 and FLAIR hyperintensity has not significantly changed since 2016. No cortical encephalomalacia or chronic cerebral blood products identified. No midline shift, mass effect, evidence of mass lesion, ventriculomegaly, extra-axial collection or acute intracranial hemorrhage. Cervicomedullary junction and pituitary are within normal limits. Vascular: Major intracranial vascular flow voids are stable since 2016. Skull and upper cervical spine: Negative for age visible cervical spine. Visualized bone marrow signal is within normal limits. Sinuses/Orbits: Stable and negative. Other: Visible internal auditory structures appear normal. Mastoid air cells remain clear. Scalp and face soft tissues appear negative. MRA HEAD FINDINGS Antegrade flow in the posterior circulation with no distal vertebral artery stenosis. Mildly dominant left vertebral artery. Normal left PICA origin. Normal vertebrobasilar junction. No basilar stenosis. AICA and SCA origins appear patent. Bilateral PCA origins are within normal limits. Posterior communicating arteries are diminutive or absent. There is bilateral moderate to severe distal P1/P2 segment PCA stenoses (series 107). MIP images also suggest severe distal right P2 stenosis, although on source images the other right PCA branches appear within normal limits. Antegrade flow in both ICA siphons. No siphon stenosis. Normal ophthalmic artery origins. Patent carotid termini. Normal MCA and ACA origins. The anterior communicating artery is diminutive or absent. There is mild to  moderate stenosis of the right ACA A2 segment (series 105, image 8), but preserved distal flow. The left MCA M1 segment is patent without stenosis. The left MCA bifurcation is patent. Questionable moderate to severe stenosis in the dominant posterior left MCA branch (might be artifact). Otherwise the visible left MCA branches appear within normal limits. The right MCA M1 segment is patent with an early bifurcation. The right MCA bifurcation is patent. The major right MCA scratched at the visible right MCA branches are patent. There is mild to moderate stenosis in the proximal aspect of the dominant right MCA M2 branch (series 105, image 12). IMPRESSION: 1. No acute infarct or acute intracranial abnormality is identified. 2. No intracranial large vessel occlusion, but intracranial MRA is remarkable for widespread moderate and severe 2nd order circle-of-Willis branch atherosclerosis and stenosis, including moderate stenosis of the dominant right MCA posterior M2 branch. 3. Sequelae of chronic small vessel disease in the brain is only mildly progressed since 2016. Electronically Signed   By: Genevie Ann M.D.   On: 08/05/2017 13:00   US Carotid Bilateral (at [facility] And [facility] Only)  Result Date: 08/05/2017 CLINICAL DATA:  84 year old female with a history of left leg weakness. Cardiovascular risk factors include hypertension EXAM: BILATERAL CAROTID DUPLEX ULTRASOUND  TECHNIQUE: Pearline Cables scale imaging, color Doppler and duplex ultrasound were performed of bilateral carotid and vertebral arteries in the neck. COMPARISON:  No prior duplex FINDINGS: Criteria: Quantification of carotid stenosis is based on velocity parameters that correlate the residual internal carotid diameter with NASCET-based stenosis levels, using the diameter of the distal internal carotid lumen as the denominator for stenosis measurement. The following velocity measurements were obtained: RIGHT ICA:  Systolic 86 cm/sec, Diastolic 13 cm/sec CCA:  95 cm/sec  SYSTOLIC ICA/CCA RATIO:  0.9 ECA:  114 cm/sec LEFT ICA:  Systolic 93 cm/sec, Diastolic 21 cm/sec CCA:  625 cm/sec SYSTOLIC ICA/CCA RATIO:  0.9 ECA:  107 cm/sec Right Brachial SBP: Not acquired Left Brachial SBP: Not acquired RIGHT CAROTID ARTERY: No significant calcifications of the right common carotid artery. Intermediate waveform maintained. Heterogeneous and partially calcified plaque at the right carotid bifurcation. No significant lumen shadowing. Low resistance waveform of the right ICA. No significant tortuosity. RIGHT VERTEBRAL ARTERY: Antegrade flow with low resistance waveform. LEFT CAROTID ARTERY: No significant calcifications of the left common carotid artery. Intermediate waveform maintained. Heterogeneous and partially calcified plaque at the left carotid bifurcation without significant lumen shadowing. Low resistance waveform of the left ICA. No significant tortuosity. LEFT VERTEBRAL ARTERY:  Antegrade flow with low resistance waveform. IMPRESSION: Color duplex indicates minimal heterogeneous and calcified plaque, with no hemodynamically significant stenosis by duplex criteria in the extracranial cerebrovascular circulation. Signed, Dulcy Fanny. Earleen Newport, DO [facility] Electronically Signed   By: Corrie Mckusick D.O.   On: 08/05/2017 13:27   Mr Jodene Nam Head/brain [facility] Cm  Result Date: 08/05/2017 CLINICAL DATA:  84 year old female with left lower extremity weakness and confusion for 24 hours. EXAM: MRI HEAD WITHOUT CONTRAST MRA HEAD WITHOUT CONTRAST TECHNIQUE: Multiplanar, multiecho pulse sequences of the brain and surrounding structures were obtained without intravenous contrast. Angiographic images of the head were obtained using MRA technique without contrast. COMPARISON:  Head CT without contrast 08/04/2017 and earlier. Brain MRI 07/21/2014. FINDINGS: MRI HEAD FINDINGS Brain: Cerebral volume is stable since 2016. No restricted diffusion or evidence  of acute infarction. A chronic lacunar infarct in the central right thalamus is new since 2016. Stable T2 heterogeneity elsewhere throughout the deep gray matter nuclei which appears mostly due to perivascular spaces. Mild for age T2 heterogeneity in the pons is stable since 2016. Scattered Patchy and confluent bilateral cerebral white matter T2 and FLAIR hyperintensity has not significantly changed since 2016. No cortical encephalomalacia or chronic cerebral blood products identified. No midline shift, mass effect, evidence of mass lesion, ventriculomegaly, extra-axial collection or acute intracranial hemorrhage. Cervicomedullary junction and pituitary are within normal limits. Vascular: Major intracranial vascular flow voids are stable since 2016. Skull and upper cervical spine: Negative for age visible cervical spine. Visualized bone marrow signal is within normal limits. Sinuses/Orbits: Stable and negative. Other: Visible internal auditory structures appear normal. Mastoid air cells remain clear. Scalp and face soft tissues appear negative. MRA HEAD FINDINGS Antegrade flow in the posterior circulation with no distal vertebral artery stenosis. Mildly dominant left vertebral artery. Normal left PICA origin. Normal vertebrobasilar junction. No basilar stenosis. AICA and SCA origins appear patent. Bilateral PCA origins are within normal limits. Posterior communicating arteries are diminutive or absent. There is bilateral moderate to severe distal P1/P2 segment PCA stenoses (series 107). MIP images also suggest severe distal right P2 stenosis, although on source images the other right PCA branches appear within normal limits. Antegrade flow in both ICA siphons. No  siphon stenosis. Normal ophthalmic artery origins. Patent carotid termini. Normal MCA and ACA origins. The anterior communicating artery is diminutive or absent. There is mild to moderate stenosis of the right ACA A2 segment (series 105, image 8), but  preserved distal flow. The left MCA M1 segment is patent without stenosis. The left MCA bifurcation is patent. Questionable moderate to severe stenosis in the dominant posterior left MCA branch (might be artifact). Otherwise the visible left MCA branches appear within normal limits. The right MCA M1 segment is patent with an early bifurcation. The right MCA bifurcation is patent. The major right MCA scratched at the visible right MCA branches are patent. There is mild to moderate stenosis in the proximal aspect of the dominant right MCA M2 branch (series 105, image 12). IMPRESSION: 1. No acute infarct or acute intracranial abnormality is identified. 2. No intracranial large vessel occlusion, but intracranial MRA is remarkable for widespread moderate and severe 2nd order circle-of-Willis branch atherosclerosis and stenosis, including moderate stenosis of the dominant right MCA posterior M2 branch. 3. Sequelae of chronic small vessel disease in the brain is only mildly progressed since 2016. Electronically Signed   By: Genevie Ann M.D.   On: 08/05/2017 13:00    Micro Results     Recent Results (from the past 240 hour(s))  Blood Culture (routine x 2)     Status: None (Preliminary result)   Collection Time: 08/07/17  7:05 PM  Result Value Ref Range Status   Specimen Description BLOOD LAC  Final   Special Requests   Final    BOTTLES DRAWN AEROBIC AND ANAEROBIC Blood Culture adequate volume   Culture   Final    NO GROWTH 4 DAYS Performed at [facility], [address] [place], [place] [address]    Report Status PENDING  Incomplete  Urine culture     Status: Abnormal   Collection Time: 08/07/17  7:11 PM  Result Value Ref Range Status   Specimen Description   Final    URINE, RANDOM Performed at [facility], [address], [place], [place] [address]    Special Requests   Final    NONE Performed at [facility], [address] [place], [place] [address]    Culture  (A)  Final    <10,000 COLONIES/mL INSIGNIFICANT GROWTH Performed at [facility], [place] [address] [place], [place] [address]    Report Status 08/09/2017 FINAL  Final  Blood Culture (routine x 2)     Status: None (Preliminary result)   Collection Time: 08/07/17  7:16 PM  Result Value Ref Range Status   Specimen Description BLOOD BLOOD LEFT WRIST  Final   Special Requests   Final    BOTTLES DRAWN AEROBIC AND ANAEROBIC Blood Culture adequate volume   Culture   Final    NO GROWTH 4 DAYS Performed at [facility], [address], [place], [place] [address]    Report Status PENDING  Incomplete  MRSA PCR Screening     Status: None   Collection Time: 08/07/17  8:18 PM  Result Value Ref Range Status   MRSA by PCR NEGATIVE NEGATIVE Final    Comment:        The GeneXpert MRSA Assay (FDA approved for NASAL specimens only), is one component of a comprehensive MRSA colonization surveillance program. It is not intended to diagnose MRSA infection nor to guide or monitor treatment for MRSA infections. Performed at [facility], [place],  [place], [place] [address]        Today   Subjective:   Melenie Lemarr stable for discharge today.  Objective:   Blood pressure 136/70, pulse 92, temperature 98 F (36.7 C), temperature source Oral, resp. rate 18, height 5' (1.524 m), weight 50.2 kg (110 lb 11.2 oz), SpO2 96 %.   Intake/Output Summary (Last 24 hours) at 08/11/2017 0850 Last data filed at 08/10/2017 1900 Gross per 24 hour  Intake 340 ml  Output -  Net 340 ml    Exam Seen at bedside.  Alert to self and place. [place].AT,PERRAL Supple Neck,No JVD, No cervical lymphadenopathy appriciated.  Symmetrical Chest wall movement, Good air movement bilaterally, CTAB RRR,No Gallops,Rubs or new Murmurs, No Parasternal Heave +ve B.Sounds, Abd Soft, Non tender, No organomegaly appriciated, No rebound -guarding or rigidity. No Cyanosis, Clubbing or edema, No new  Rash or bruise  Data Review   CBC w Diff:  Lab Results  Component Value Date   WBC 7.0 08/08/2017   HGB 12.2 08/08/2017   HGB 12.4 09/19/2014   HCT 36.2 08/08/2017   HCT 38.0 09/19/2014   PLT 214 08/08/2017   PLT 288 09/19/2014   LYMPHOPCT 9 08/07/2017   LYMPHOPCT 31.1 09/19/2014   MONOPCT 13 08/07/2017   MONOPCT 15.6 09/19/2014   EOSPCT 0 08/07/2017   EOSPCT 1.2 09/19/2014   BASOPCT 0 08/07/2017   BASOPCT 3.6 09/19/2014    CMP:  Lab Results  Component Value Date   NA 137 08/08/2017   NA 138 09/19/2014   K 3.6 08/08/2017   K 4.0 09/19/2014   CL 109 08/08/2017   CL 103 09/19/2014   CO2 19 (L) 08/08/2017   CO2 29 09/19/2014   BUN 10 08/08/2017   BUN 15 09/19/2014   CREATININE 0.65 08/08/2017   CREATININE 0.69 09/19/2014   PROT 6.8 08/07/2017   PROT 6.8 09/19/2014   ALBUMIN 3.1 (L) 08/07/2017   ALBUMIN 3.9 09/19/2014   BILITOT 0.6 08/07/2017   BILITOT 0.6 09/19/2014   ALKPHOS 153 (H) 08/07/2017   ALKPHOS 85 09/19/2014   AST 71 (H) 08/07/2017   AST 20 09/19/2014   ALT 58 (H) 08/07/2017   ALT 16 09/19/2014  .   Total Time in preparing paper work, data evaluation and todays exam - 35 minutes  Epifanio Lesches M.D on 08/11/2017 at 8:50 AM    Note: This dictation was prepared with Dragon dictation along with smaller phrase technology. Any transcriptional errors that result from this process are unintentional.

## 2017-08-11 NOTE — Progress Notes (Signed)
Patient to discharge to [facility]. Report called to Maudie Mercury at facility. Son will transport to facility.

## 2017-08-11 NOTE — Clinical Social Work Placement (Signed)
   CLINICAL SOCIAL WORK PLACEMENT  NOTE  Date:  08/11/2017  Patient Details  Name: [name] MRN: [id number] Date of Birth: [birth year]  Clinical Social Work is seeking post-discharge placement for this patient at the [place] level of care (*CSW will initial, date and re-position this form in  chart as items are completed):  Yes   Patient/family provided with [facility] Work Department's list of facilities offering this level of care within the geographic area requested by the patient (or if unable, by the patient's family).  Yes   Patient/family informed of their freedom to choose among providers that offer the needed level of care, that participate in Medicare, Medicaid or managed care program needed by the patient, have an available bed and are willing to accept the patient.  Yes   Patient/family informed of [facility]'s ownership interest in [facility] and [facility], as well as of the fact that they are under no obligation to receive care at these facilities.  PASRR submitted to EDS on 08/11/17     PASRR number received on 08/11/17     Existing PASRR number confirmed on       FL2 transmitted to all facilities in geographic area requested by pt/family on 08/11/17     FL2 transmitted to all facilities within larger geographic area on       Patient informed that his/her managed care company has contracts with or will negotiate with certain facilities, including the following:        Yes   Patient/family informed of bed offers received.  [facility]     Physician recommends and patient chooses bed at      [facility] to be transferred to ([facility] on 08/11/17.  Patient to be transferred to facility by (Patient's son Jeff's girlfriend Donella Stade will transport. )     Patient family notified on 08/11/17 of transfer.  Name of family member notified:  (Patient's sons Merry Proud and Wille Glaser are aware of above D/C today.  Patient's son Jeff's girlfriend [name] is aware of D/C today. )     PHYSICIAN       Additional Comment:    _______________________________________________ Khamari Yousuf, Veronia Beets, LCSW 08/11/2017, 1:48 PM

## 2017-08-11 NOTE — Clinical Social Work Note (Signed)
Clinical Social Work Assessment  Patient Details  Name: [name] MRN: [id number] Date of Birth: 02-Nov-1924  Date of referral:  08/11/17               Reason for consult:  Facility Placement                Permission sought to share information with:  Chartered certified accountant granted to share information::  Yes, Verbal Permission Granted  Name::      [place]::   [place]   Relationship::     Contact Information:     Housing/Transportation Living arrangements for the past 2 months:  [place] of Information:  Adult Children Patient Interpreter Needed:  None Criminal Activity/Legal Involvement Pertinent to Current Situation/Hospitalization:  No - Comment as needed Significant Relationships:  Adult Children Lives with:  Self Do you feel safe going back to the place where you live?  Yes Need for family participation in patient care:  Yes (Comment)  Care giving concerns:  Patient lives alone in [place].    Social Worker assessment / plan:  Holiday representative (CSW) received SNF consult. PT is recommending SNF. Per chart patient is not alert and oriented X4 so CSW contacted patient's sons [name] and [name]. Per [name] him and his girlfriend [name] live in [place] close to patient and provide care as needed. Per Joe he lives in [place]. CSW explained SNF process and that medicare requires a 3 night qualfying inpatient stay in the hospital in order for medicare to pay for SNF. Patient was admitted to inpatient on 08/07/17. Sons verbalized their understanding and are agreeable to SNF search in [place]. CSW also discussed long term care options after rehab with sons. FL2 complete and faxed out.   CSW presented bed offers to [name]. He chose [facility]. Patient is medically stable for D/C to [facility] today. Per [facility] liaison patient can come today to room 803. RN will call report to [place] at [phone].  Patient's son Jeff's girlfriend [name] will provide transport. CSW sent D/C orders to [facility] via [facility]. Please reconsult if future social work needs arise. CSW signing off.    Employment status:  Disabled (Comment on whether or not currently receiving Disability), Retired Forensic scientist:  Medicare PT Recommendations:  [place] / Referral to community resources:  [place]  Patient/Family's Response to care:  Patient's sons are agreeable for patient to go to [facility] today.   Patient/Family's Understanding of and Emotional Response to Diagnosis, Current Treatment, and Prognosis:  Patient's sons were very pleasant and thanked CSW for assistance.   Emotional Assessment Appearance:  Appears stated age Attitude/Demeanor/Rapport:  Unable to Assess Affect (typically observed):  Unable to Assess Orientation:  Oriented to Self, Oriented to Place, Fluctuating Orientation (Suspected and/or reported Sundowners) Alcohol / Substance use:  Not Applicable Psych involvement (Current and /or in the community):  No (Comment)  Discharge Needs  Concerns to be addressed:  Discharge Planning Concerns Readmission within the last 30 days:  No Current discharge risk:  Dependent with Mobility, Cognitively Impaired Barriers to Discharge:  No Barriers Identified   Fabiola Mudgett, Veronia Beets, LCSW 08/11/2017, 1:49 PM

## 2017-08-11 NOTE — Care Management Important Message (Signed)
Important Message  Patient Details  Name: Oria Klimas MRN: [id number] Date of Birth: 1925-01-05   Medicare Important Message Given:  Yes    Shelbie Ammons, RN 08/11/2017, 6:30 AM

## 2017-08-12 LAB — CULTURE, BLOOD (ROUTINE X 2)
CULTURE: NO GROWTH
CULTURE: NO GROWTH
SPECIAL REQUESTS: ADEQUATE
SPECIAL REQUESTS: ADEQUATE

## 2017-08-14 ENCOUNTER — Telehealth: Payer: Self-pay | Admitting: *Deleted

## 2017-08-14 NOTE — Telephone Encounter (Signed)
[name] called asking if [name] appointment can be moved out by one week. Please advise.

## 2017-08-14 NOTE — Telephone Encounter (Signed)
Per Dr. Jacinto Reap, he states it is okay to move patient's appt out by one week. Colette, could you reschedule patient's appt on 3/14 to be moved out 1 week? Thanks!

## 2017-08-21 ENCOUNTER — Other Ambulatory Visit: Payer: Medicare Other

## 2017-08-21 ENCOUNTER — Ambulatory Visit: Payer: Medicare Other | Admitting: Internal Medicine

## 2017-08-26 ENCOUNTER — Telehealth: Payer: Self-pay | Admitting: *Deleted

## 2017-08-26 NOTE — Telephone Encounter (Signed)
Please have the facility contact the rounding doctor per Dr. Jacinto Reap.

## 2017-08-26 NOTE — Telephone Encounter (Signed)
[facility] called asking for a as needed pain medicine order for [name] as she is having hip pain and they had nothing on hand orderwise to give her. They request a return call [phone]

## 2017-08-27 NOTE — Telephone Encounter (Signed)
I called Facility and got voice mail and left message to return call

## 2017-08-28 NOTE — Telephone Encounter (Signed)
Per [name] at [facility] there is not a facility doctor, they will contact her PCP for orders

## 2017-09-01 ENCOUNTER — Telehealth: Payer: Self-pay | Admitting: Internal Medicine

## 2017-09-01 ENCOUNTER — Ambulatory Visit
Admission: RE | Admit: 2017-09-01 | Discharge: 2017-09-01 | Disposition: A | Discharge status: Home / Self Care | Payer: Medicare Other | Source: Ambulatory Visit | Attending: Internal Medicine | Admitting: Internal Medicine

## 2017-09-01 ENCOUNTER — Encounter: Payer: Self-pay | Admitting: Internal Medicine

## 2017-09-01 ENCOUNTER — Other Ambulatory Visit: Payer: Self-pay

## 2017-09-01 ENCOUNTER — Inpatient Hospital Stay: Payer: Medicare Other | Attending: Internal Medicine

## 2017-09-01 ENCOUNTER — Inpatient Hospital Stay (HOSPITAL_BASED_OUTPATIENT_CLINIC_OR_DEPARTMENT_OTHER): Payer: Medicare Other | Admitting: Internal Medicine

## 2017-09-01 ENCOUNTER — Telehealth: Payer: Self-pay | Admitting: *Deleted

## 2017-09-01 VITALS — BP 101/66 | HR 92 | Temp 97.9°F | Resp 20 | Ht 60.0 in | Wt 99.0 lb

## 2017-09-01 DIAGNOSIS — I10 Essential (primary) hypertension: Secondary | ICD-10-CM

## 2017-09-01 DIAGNOSIS — Z79899 Other long term (current) drug therapy: Secondary | ICD-10-CM

## 2017-09-01 DIAGNOSIS — F0390 Unspecified dementia without behavioral disturbance: Secondary | ICD-10-CM | POA: Insufficient documentation

## 2017-09-01 DIAGNOSIS — F419 Anxiety disorder, unspecified: Secondary | ICD-10-CM | POA: Diagnosis not present

## 2017-09-01 DIAGNOSIS — Z7982 Long term (current) use of aspirin: Secondary | ICD-10-CM

## 2017-09-01 DIAGNOSIS — Z923 Personal history of irradiation: Secondary | ICD-10-CM

## 2017-09-01 DIAGNOSIS — R05 Cough: Secondary | ICD-10-CM | POA: Insufficient documentation

## 2017-09-01 DIAGNOSIS — M1990 Unspecified osteoarthritis, unspecified site: Secondary | ICD-10-CM | POA: Diagnosis not present

## 2017-09-01 DIAGNOSIS — E785 Hyperlipidemia, unspecified: Secondary | ICD-10-CM | POA: Insufficient documentation

## 2017-09-01 DIAGNOSIS — Z9049 Acquired absence of other specified parts of digestive tract: Secondary | ICD-10-CM | POA: Diagnosis not present

## 2017-09-01 DIAGNOSIS — J440 Chronic obstructive pulmonary disease with acute lower respiratory infection: Secondary | ICD-10-CM | POA: Diagnosis not present

## 2017-09-01 DIAGNOSIS — M8788 Other osteonecrosis, other site: Secondary | ICD-10-CM

## 2017-09-01 DIAGNOSIS — C9000 Multiple myeloma not having achieved remission: Secondary | ICD-10-CM | POA: Diagnosis not present

## 2017-09-01 DIAGNOSIS — R5383 Other fatigue: Secondary | ICD-10-CM | POA: Insufficient documentation

## 2017-09-01 DIAGNOSIS — I4891 Unspecified atrial fibrillation: Secondary | ICD-10-CM

## 2017-09-01 DIAGNOSIS — R059 Cough, unspecified: Secondary | ICD-10-CM

## 2017-09-01 DIAGNOSIS — Z95828 Presence of other vascular implants and grafts: Secondary | ICD-10-CM

## 2017-09-01 DIAGNOSIS — I2510 Atherosclerotic heart disease of native coronary artery without angina pectoris: Secondary | ICD-10-CM | POA: Insufficient documentation

## 2017-09-01 LAB — COMPREHENSIVE METABOLIC PANEL
ALBUMIN: 3.4 g/dL — AB (ref 3.5–5.0)
ALT: 10 U/L — ABNORMAL LOW (ref 14–54)
ANION GAP: 12 (ref 5–15)
AST: 21 U/L (ref 15–41)
Alkaline Phosphatase: 66 U/L (ref 38–126)
BUN: 25 mg/dL — AB (ref 6–20)
CO2: 18 mmol/L — AB (ref 22–32)
Calcium: 8.5 mg/dL — ABNORMAL LOW (ref 8.9–10.3)
Chloride: 104 mmol/L (ref 101–111)
Creatinine, Ser: 0.72 mg/dL (ref 0.44–1.00)
GFR calc Af Amer: >60 mL/min (ref >60)
GFR calc non Af Amer: >60 mL/min (ref >60)
GLUCOSE: 90 mg/dL (ref 65–99)
POTASSIUM: 4.3 mmol/L (ref 3.5–5.1)
SODIUM: 134 mmol/L — AB (ref 135–145)
Total Bilirubin: 0.6 mg/dL (ref 0.3–1.2)
Total Protein: 6.8 g/dL (ref 6.5–8.1)

## 2017-09-01 LAB — CBC WITH DIFFERENTIAL/PLATELET
BASOS ABS: 0 10*3/uL (ref 0–0.1)
Basophils Relative: 1 %
EOS PCT: 0 %
Eosinophils Absolute: 0 10*3/uL (ref 0–0.7)
HEMATOCRIT: 34.2 % — AB (ref 35.0–47.0)
Hemoglobin: 11.6 g/dL — ABNORMAL LOW (ref 12.0–16.0)
LYMPHS PCT: 17 %
Lymphs Abs: 1.1 10*3/uL (ref 1.0–3.6)
MCH: 30.3 pg (ref 26.0–34.0)
MCHC: 34 g/dL (ref 32.0–36.0)
MCV: 89.3 fL (ref 80.0–100.0)
MONO ABS: 0.8 10*3/uL (ref 0.2–0.9)
Monocytes Relative: 13 %
NEUTROS ABS: 4.3 10*3/uL (ref 1.4–6.5)
Neutrophils Relative %: 69 %
PLATELETS: 230 10*3/uL (ref 150–440)
RBC: 3.83 MIL/uL (ref 3.80–5.20)
RDW: 15.2 % — AB (ref 11.5–14.5)
WBC: 6.2 10*3/uL (ref 3.6–11.0)

## 2017-09-01 MED ORDER — SODIUM CHLORIDE 0.9% FLUSH
10.0000 mL | INTRAVENOUS | Status: AC | PRN
  Administered 2017-09-01: 10 mL
  Filled 2017-09-01: qty 10

## 2017-09-01 MED ORDER — OXYCODONE HCL 5 MG PO TABS: ORAL_TABLET | ORAL | 0 refills | Status: DC

## 2017-09-01 MED ORDER — HEPARIN SOD (PORK) LOCK FLUSH 100 UNIT/ML IV SOLN
500.0000 [IU] | INTRAVENOUS | Status: AC | PRN
  Administered 2017-09-01: 500 [IU]

## 2017-09-01 MED ORDER — MEGESTROL ACETATE 40 MG PO TABS: 80.0000 mg | ORAL_TABLET | Freq: Two times a day (BID) | ORAL | 3 refills | Status: DC

## 2017-09-01 NOTE — Assessment & Plan Note (Addendum)
Multiple myeloma biochemical relapse June 2018. Most recently on # 6 daratumumab [last June 19, 2017] M protein improved; however discontinued because of fatigue/recent pneumonia [see discussion below].  #Recommend continued surveillance; and holding of daratumumab-especially given the recent pneumonia.  # recent pneumonia- RML- s/p hospitalization- improved; CXR- repeat today.   # Confusion/Delirium- sec to ongoing dementia; continue risperidol 36m/day.  # Right hip pain-Likely arthritic pain. Continue fentanyl patch 25 recommend adding; 2.5 mg of oxycodone needed twice a day. S prescription given  # "Dehydration"-as per family clinically patient is not dehydrated.  Would not recommend IV fluids.   #Poor appetite weight loss-recommend increasing Megace to 80 mg twice a day.  # follow up in 2 months/labs/no treatment planned. [phone]/call- DIL.   Addendum: please inform pt's family that CXr did not show pneumonia. Follow up as planned.   Cc;Dr. OKym GroomMD

## 2017-09-01 NOTE — Telephone Encounter (Signed)
Please call- [phone]/[id number]: Please inform pt's family that CXr did not show pneumonia. Follow up as planned.

## 2017-09-01 NOTE — Telephone Encounter (Signed)
Narcotic orders cannot be a range, so they need a new order faxed to them for Oxycodone 1/2 tablet every 8 hours as needed [phone]

## 2017-09-01 NOTE — Progress Notes (Signed)
[place] OFFICE PROGRESS NOTE  Patient Care Team: [place], [facility] as PCP - General Forest Gleason, MD (Oncology) Christene Lye, MD (General Surgery)  No matching staging information was found for the patient.   Oncology History   1. Stage III Multiple Myeloma a. 11/20/09: SPEP showed quantitative IgG 4567 mg/dL with M spike of 3.8 g/dL and IFE showing IgG monoclonal protein with light chain specificity.  [place] urine protein electrophoresis showed M spike 27% Bence Jones Protein.  a. 11/28/09: bone marrow biopsy: - PLASMA CELL DYSCRASIA CONSISTENT WITH MULTIPLE MYELOMA (55%PLASMA CELLS ON ASPIRATE, 75% BY CD138 IMMUNOHISTOCHEMISTRY,KAPPA RESTRICTED).TRILINEAGE HEMATOPOIESIS.46 XX,  Normal FISH panel. b. 11/29/09: bone survey: There are numerous, scattered 2 mm to 5 mm concentric  radiolucencies in the bony calvaria.   Two or three similar focal lucent areas in the right  femur.  There are observed changes consistent with disc disease at multiple levels of the cervical spine and multiple levels of the lumbar spine. c. 12/07/09: right humerus: Multiple tiny lucencies noted throughout the right humerus including humeral diaphysis. No fracture. d. 8/11- 4/12 Mephalan/prednisone/velcade X 6 cycles with decrease in Mspike to 1.0 gm/dl. e.osteonecrosis of jaw and diagnosed in December of 2013(Zometa was discontinued) 7.Patient was started on Velcade and Decadron   because of progressive multiple myeloma from January of 2013. 8. Started on Kyprolis 05/13 9.progressing disease on kyphorilis September of 2013 10 patient has been started on [name] daily for 21 days and one week off Decadron day1, 18 and 15   with 4 week cycle is finished palliative radiation therapy without much relief in pain(November, 2013) 11.Palliative radiation therapy to the right shoulder (December, 2014) 12 patient was on break from pomolidomide because of infection and it has been  presumed from December 05, 2014  # July 2017- skeletal survey- stable bone lesions.   # July 10th PET- No bone involvement  # July 16th 2018- [place]; stop Pom sec cytopenia.   ------------------------------------------------------  # History of osteonecrosis of the jaw- not on Zometa.     Multiple myeloma not having achieved remission ([place])     INTERVAL HISTORY: Patient is a fair historian at best secondary to dementia.  [name] 84 y.o.  female pleasant patient With mild-moderate dementia and above history of Multiple myeloma most recently s/p daratumumab [#5 Jan 10th 2019] -currently on surveillance is here for follow-up.  Patient was recently admitted to hospital for right lower lobe/middle lobe pneumonia-status post antibiotics.  Patient is currently in a rehab/nursing home.   As per family-patient continues to be fatigued.  She has poor appetite; not eating and drinking enough.   As per family again patient having episodes of pain after physical therapy needing to take oxycodone. [2.5 mg twice a day].  Patient having issues with getting oxycodone at the nursing home.  REVIEW OF SYSTEMS:  A complete 10 point review of system is done which is negative except mentioned above/history of present illness.   PAST MEDICAL HISTORY :  Past Medical History:  Diagnosis Date  . Anxiety   . Arthritis   . Atrial fibrillation ([place])   . Coronary arteriosclerosis   . History of blood transfusion 2014  . Hyperlipidemia   . Hypertension   . Multiple myeloma ([place])   . Multiple myeloma in relapse ([place]) 10/29/2014  . Osteonecrosis of jaw ([place])    left    PAST SURGICAL HISTORY :   Past Surgical History:  Procedure Laterality Date  .  ABDOMINAL HYSTERECTOMY    . APPENDECTOMY    . PARTIAL COLECTOMY    . TOTAL KNEE REVISION Left     FAMILY HISTORY :   Family History  Problem Relation Age of Onset  . Cancer Father   . Heart attack Mother     SOCIAL HISTORY:   Social History    Tobacco Use  . Smoking status: Never Smoker  . Smokeless tobacco: Never Used  Substance Use Topics  . Alcohol use: Yes    Comment: occasional  . Drug use: No    ALLERGIES:  is allergic to biaxin [clarithromycin]; demerol [meperidine]; hydrocodone; sulfa antibiotics; tramadol; and ultracet [tramadol-acetaminophen].  MEDICATIONS:  Current Outpatient Medications  Medication Sig Dispense Refill  . acyclovir (ZOVIRAX) 400 MG tablet TAKE 1 TABLET BY MOUTH ONCE DAILY TO  PREVENT  SHINGLES (Patient taking differently: Take 400 mg by mouth at bedtime. TAKE 1 TABLET BY MOUTH ONCE DAILY TO  PREVENT  SHINGLES) 30 tablet 3  . aspirin EC 81 MG tablet Take 1 tablet (81 mg total) by mouth daily. 30 tablet 1  . bisacodyl (DULCOLAX) 5 MG EC tablet Take 1 tablet (5 mg total) by mouth daily as needed for moderate constipation. 30 tablet 0  . divalproex (DEPAKOTE) 125 MG DR tablet Take 1 tablet by mouth daily.    Mariane Baumgarten Sodium (COLACE PO) Take 2 tablets by mouth at bedtime.     . fentaNYL (DURAGESIC - DOSED MCG/HR) 25 MCG/HR patch Place 1 patch (25 mcg total) onto the skin every 3 (three) days. 10 patch 0  . megestrol (MEGACE) 40 MG tablet Take 2 tablets (80 mg total) by mouth 2 (two) times daily. 60 tablet 3  . Melatonin 5 MG TABS Take 1 tablet by mouth daily.    . mirtazapine (REMERON) 30 MG tablet TAKE 1 TABLET BY MOUTH AT BEDTIME 60 tablet 2  . polyethylene glycol (MIRALAX / GLYCOLAX) packet Take 17 g by mouth at bedtime as needed for mild constipation.     . risperiDONE (RISPERDAL) 1 MG tablet Take 1 tablet (1 mg total) by mouth at bedtime. 90 tablet 3  . oxyCODONE (OXY IR/ROXICODONE) 5 MG immediate release tablet 1/2 pill [2.5 mg] every 8-12 hours as needed for pain 30 tablet 0   No current facility-administered medications for this visit.    Facility-Administered Medications Ordered in Other Visits  Medication Dose Route Frequency Provider Last Rate Last Dose  . sodium chloride 0.9 %  injection 10 mL  10 mL Intravenous PRN Forest Gleason, MD   10 mL at 03/01/15 1305    PHYSICAL EXAMINATION: ECOG PERFORMANCE STATUS: 0 - Asymptomatic  BP 101/66   Pulse 92   Temp 97.9 F (36.6 C) (Tympanic)   Resp 20   Ht 5' (1.524 m)   Wt 99 lb (44.9 kg)   BMI 19.33 kg/m   Filed Weights   09/01/17 1116  Weight: 99 lb (44.9 kg)    GENERAL: frail-appearing elderly female patiett Alert, no distress and comfortable. Accompanied by her care giver/ daughter-in-law/son. Patient is walking. EYES: no pallor or icterus OROPHARYNX: no thrush or ulceration; good dentition.  NECK: supple, no masses felt LYMPH:  no palpable lymphadenopathy in the cervical, axillary or inguinal regions LUNGS: clear to auscultation and  No wheeze or crackles HEART/CVS: regular rate & rhythm and no murmurs; no swelling of the legs. No tenderness.  ABDOMEN:abdomen soft, non-tender and normal bowel sounds Musculoskeletal:no cyanosis of digits and no clubbing  PSYCH: alert &  oriented x 3 with fluent speech NEURO: no focal motor/sensory deficits SKIN:  no rashes or significant lesions  LABORATORY DATA:  I have reviewed the data as listed    Component Value Date/Time   NA 134 (L) 09/01/2017 1110   NA 138 09/19/2014 1118   K 4.3 09/01/2017 1110   K 4.0 09/19/2014 1118   CL 104 09/01/2017 1110   CL 103 09/19/2014 1118   CO2 18 (L) 09/01/2017 1110   CO2 29 09/19/2014 1118   GLUCOSE 90 09/01/2017 1110   GLUCOSE 91 09/19/2014 1118   BUN 25 (H) 09/01/2017 1110   BUN 15 09/19/2014 1118   CREATININE 0.72 09/01/2017 1110   CREATININE 0.69 09/19/2014 1118   CALCIUM 8.5 (L) 09/01/2017 1110   CALCIUM 9.0 09/19/2014 1118   PROT 6.8 09/01/2017 1110   PROT 6.8 09/19/2014 1118   ALBUMIN 3.4 (L) 09/01/2017 1110   ALBUMIN 3.9 09/19/2014 1118   AST 21 09/01/2017 1110   AST 20 09/19/2014 1118   ALT 10 (L) 09/01/2017 1110   ALT 16 09/19/2014 1118   ALKPHOS 66 09/01/2017 1110   ALKPHOS 85 09/19/2014 1118    BILITOT 0.6 09/01/2017 1110   BILITOT 0.6 09/19/2014 1118   GFRNONAA >60 09/01/2017 1110   GFRNONAA >60 09/19/2014 1118   GFRAA >60 09/01/2017 1110   GFRAA >60 09/19/2014 1118    No results found for: SPEP, UPEP  Lab Results  Component Value Date   WBC 6.2 09/01/2017   NEUTROABS 4.3 09/01/2017   HGB 11.6 (L) 09/01/2017   HCT 34.2 (L) 09/01/2017   MCV 89.3 09/01/2017   PLT 230 09/01/2017      Chemistry      Component Value Date/Time   NA 134 (L) 09/01/2017 1110   NA 138 09/19/2014 1118   K 4.3 09/01/2017 1110   K 4.0 09/19/2014 1118   CL 104 09/01/2017 1110   CL 103 09/19/2014 1118   CO2 18 (L) 09/01/2017 1110   CO2 29 09/19/2014 1118   BUN 25 (H) 09/01/2017 1110   BUN 15 09/19/2014 1118   CREATININE 0.72 09/01/2017 1110   CREATININE 0.69 09/19/2014 1118      Component Value Date/Time   CALCIUM 8.5 (L) 09/01/2017 1110   CALCIUM 9.0 09/19/2014 1118   ALKPHOS 66 09/01/2017 1110   ALKPHOS 85 09/19/2014 1118   AST 21 09/01/2017 1110   AST 20 09/19/2014 1118   ALT 10 (L) 09/01/2017 1110   ALT 16 09/19/2014 1118   BILITOT 0.6 09/01/2017 1110   BILITOT 0.6 09/19/2014 1118      IMPRESSION: No FDG avid myelomatous lesions are evident.  Mildly hypermetabolic mediastinal and bilateral perihilar lymph nodes, likely reactive.   Electronically Signed   By: Julian Hy M.D.   On: 12/13/2016 11:41  Results for [name], [name] (MRN [id number]) as of 06/19/2017 09:04  Ref. Range 08/30/2004 11:38 09/07/2004 12:40 09/28/2004 13:26 10/29/2004 11:21 02/14/2006 12:55 05/20/2006 16:17 05/20/2006 16:59 09/03/2006 12:23 03/05/2007 10:55 03/18/2007 14:23 05/18/2007 10:42 06/02/2007 09:33 07/16/2007 14:13 11/11/2007 15:00 12/21/2007 11:09 11/20/2009 11:07 11/29/2009 09:50 12/05/2009 15:32 12/07/2009 17:30 12/07/2009 17:31 12/26/2009 13:51 06/07/2010 07:45 09/19/2010 13:27 09/19/2010 13:31 12/26/2010 15:50 01/31/2011 11:59 06/19/2011 14:28 07/03/2011 10:10 07/11/2011 14:09 07/17/2011 14:10 07/24/2011  14:09 07/31/2011 14:02 08/14/2011 14:06 08/28/2011 13:37 09/11/2011 14:46 09/18/2011 14:22 09/25/2011 14:38 10/02/2011 14:26 10/09/2011 09:38 10/09/2011 10:30 10/09/2011 11:01 10/09/2011 13:26 10/16/2011 13:29 10/23/2011 09:04 10/30/2011 10:39 11/13/2011 08:33 11/20/2011 08:11 11/27/2011 08:38 12/11/2011 10:02 12/25/2011 08:55 01/01/2012 09:02 01/23/2012  11:08 01/30/2012 11:37 02/04/2012 13:07 02/20/2012 14:21 03/19/2012 12:36 03/20/2012 13:26 04/01/2012 20:07 04/01/2012 20:14 04/01/2012 23:29 04/01/2012 23:50 04/02/2012 00:15 04/02/2012 10:09 04/10/2012 13:33 04/13/2012 13:30 04/28/2012 13:46 04/28/2012 13:47 05/18/2012 12:02 06/16/2012 10:38 07/14/2012 10:48 08/11/2012 10:59 09/08/2012 11:13 10/01/2012 13:01 10/06/2012 11:04 11/03/2012 11:10 12/15/2012 11:34 01/05/2013 11:20 02/02/2013 10:58 02/23/2013 10:32 02/23/2013 12:20 02/23/2013 12:26 04/06/2013 11:17 04/14/2013 10:29 05/04/2013 10:30 05/12/2013 11:47 06/15/2013 11:45 07/29/2013 10:38 08/26/2013 10:39 10/14/2013 11:03 10/18/2013 12:59 10/18/2013 13:10 12/09/2013 11:20 01/20/2014 11:27 02/21/2014 10:28 03/03/2014 11:29 04/06/2014 11:36 04/14/2014 11:22 05/19/2014 11:46 06/08/2014 15:25 06/19/2014 21:38 06/23/2014 10:28 06/23/2014 10:29 07/21/2014 12:24 08/04/2014 10:39 08/11/2014 10:47 09/19/2014 11:18 10/24/2014 11:58 12/05/2014 11:38 12/05/2014 11:39 12/27/2014 11:50 01/02/2015 11:51 01/02/2015 11:52 01/16/2015 00:00 02/02/2015 11:08 02/15/2015 15:34 03/01/2015 11:03 03/01/2015 11:03 03/08/2015 17:36 03/29/2015 10:47 04/27/2015 10:31 04/27/2015 10:31 05/25/2015 11:05 05/25/2015 11:06 06/14/2015 16:37 06/27/2015 11:13 06/27/2015 11:14 07/25/2015 10:52 08/22/2015 15:28 09/28/2015 14:24 11/01/2015 10:59 11/29/2015 10:53 12/28/2015 10:00 12/28/2015 12:30 01/25/2016 11:17 02/23/2016 10:28 03/22/2016 10:55 04/04/2016 14:32 04/08/2016 16:20 04/22/2016 13:55 05/23/2016 10:30 06/12/2016 15:18 06/12/2016 19:01 06/12/2016 20:07 07/05/2016 12:07 08/07/2016 09:38 09/05/2016 10:40 10/01/2016 11:14 10/29/2016 10:53 10/29/2016 10:58 12/03/2016 10:54 12/13/2016 08:18 12/13/2016 11:11  12/23/2016 08:11 12/23/2016 09:20 01/01/2017 08:01 01/08/2017 08:05 01/15/2017 08:07 01/22/2017 08:18 01/29/2017 08:19 02/05/2017 08:09 02/05/2017 08:24 02/12/2017 09:12 02/19/2017 08:40 03/05/2017 09:16 04/02/2017 08:32 04/02/2017 08:46 05/07/2017 08:35 06/19/2017 07:59  Kappa free light chain Latest Ref Range: 3.3 - 19.4 mg/L                                                                                                           14.97 18.31   16.04   16.25  14.23    16.63  16.95   15.77  13.78 16.56 15.65 18.81 19.8 (H) 15.7  12.8 20.5 (H) 27.0 (H)   19.2 31.1 (H)    82.4 (H) 73.1 (H) 81.6 (H) 122.0 (H) 113.3 (H)  110.6 (H)           21.6 (H)     22.6 (H) 17.5   Lamda free light chains Latest Ref Range: 5.7 - 26.3 mg/L                                                                                                           11.56 11.72   11.21   9.68  9.90    10.80  13.59   12.81  9.95 10.92 9.14 9.23 8.2 8.3  9.6 11.0 17.5   10.1 9.7    12.2 12.3 12.1 11.7 12.9  11.6           5.6 (L)  5.5 (L) 5.3 (L)   Kappa, lamda light chain ratio Latest Ref Range: 0.26 - 1.65                                                                                                            1.29 1.56   1.43   1.68 (H)  1.44    1.54  1.25   1.23  1.38 1.52 1.71 (H) 2.04 (H) 2.41 (H) 1.89 (H)  1.33 1.86 (H) 1.54   1.90 (H) 3.21 (H)    6.75 (H) 5.94 (H) 6.74 (H) 10.43 (H) 8.78 (H)  9.53 (H)           3.86 (H)     4.11 (H) 3.30 (H)   M Protein SerPl Elph-Mcnc Latest Ref Range: Not Observed g/dL                                                                                                                    0.4 (H)    0.5 (H)  0.5 (H)    0.4 (H) 0.4 (H) 0.4 (H) 0.4 (H) 0.4 (H) 0.4 (H) 0.5 (H)  0.4 (H) 0.5 (H) 0.5 (H)   0.5 (H) 0.8 (H)    1.1 (H) 1.1 (H) 1.2 (H) 1.3 (H)  1.4 (H) 1.6 (H)          1.0 (H)     1.0 (H)  0.9 (H)       RADIOGRAPHIC STUDIES: I have personally reviewed the radiological images as listed and agreed with the findings in  the report. Dg Chest 2 View  Result Date: 09/01/2017 CLINICAL DATA:  2-3 days of cough. Recent episode of pneumonia. History of atrial fibrillation, coronary artery disease, multiple myeloma not in remission. EXAM: CHEST - 2 VIEW COMPARISON:  Chest x-ray of August 07, 2017 FINDINGS: The lungs are well-expanded. There is no focal infiltrate. There is no pleural effusion. The heart and pulmonary vascularity are normal. There is tortuosity of the descending thoracic aorta with calcification in the aortic arch. There is a stable calcified nodule in the lingula. The power port catheter tip projects over the distal third of the SVC. There is gentle levocurvature centered in the mid to lower thoracic spine. There is high-grade compression of approximately L1 which is chronic. IMPRESSION: COPD.  No pneumonia nor CHF.  Previous granulomatous infection. Electronically Signed   By: David  Martinique M.D.   On: 09/01/2017 13:40     ASSESSMENT & PLAN:  Multiple myeloma not having achieved remission [facility]) Multiple myeloma biochemical relapse June  2018. Most recently on # 6 daratumumab [last June 19, 2017] M protein improved; however discontinued because of fatigue/recent pneumonia [see discussion below].  #Recommend continued surveillance; and holding of daratumumab-especially given the recent pneumonia.  # recent pneumonia- RML- s/p hospitalization- improved; CXR- repeat today.   # Confusion/Delirium- sec to ongoing dementia; continue risperidol 36m/day.  # Right hip pain-Likely arthritic pain. Continue fentanyl patch 25 recommend adding; 2.5 mg of oxycodone needed twice a day. S prescription given  # "Dehydration"-as per family clinically patient is not dehydrated.  Would not recommend IV fluids.   #Poor appetite weight loss-recommend increasing Megace to 80 mg twice a day.  # follow up in 2 months/labs/no treatment planned. [phone]/call- DIL.   Addendum: please inform pt's family that CXr did not  show pneumonia. Follow up as planned.   Cc;Dr. OKym GroomMD  Orders Placed This Encounter  Procedures  . DG Chest 2 View    Standing Status:   Future    Number of Occurrences:   1    Standing Expiration Date:   11/01/2018    Order Specific Question:   Reason for Exam (SYMPTOM  OR DIAGNOSIS REQUIRED)    Answer:   cough; recent pneumonia    Order Specific Question:   [facility]    [facility]    Standing Status:   Future    Standing Expiration Date:   09/02/2018  . Comprehensive metabolic panel    Standing Status:   Future    Standing Expiration Date:   09/02/2018  . Multiple Myeloma Panel (SPEP&IFE w/QIG)    Standing Status:   Future    Standing Expiration Date:   09/02/2018  . Kappa/lambda light chains    Standing Status:   Future    Standing Expiration Date:   09/02/2018   All questions were answered. The patient knows to call the clinic with any problems, questions or concerns.      GCammie Sickle MD 09/01/2017 2:10 PM

## 2017-09-01 NOTE — Telephone Encounter (Signed)
Left vm for [name]- patient's caregiver for results

## 2017-09-01 NOTE — Telephone Encounter (Signed)
Order faxed.

## 2017-09-01 NOTE — Progress Notes (Signed)
10 lbs loss in approx. 1 month

## 2017-09-02 LAB — KAPPA/LAMBDA LIGHT CHAINS
KAPPA FREE LGHT CHN: 22.1 mg/L — AB (ref 3.3–19.4)
Kappa, lambda light chain ratio: 3.25 — ABNORMAL HIGH (ref 0.26–1.65)
LAMDA FREE LIGHT CHAINS: 6.8 mg/L (ref 5.7–26.3)

## 2017-09-03 ENCOUNTER — Telehealth: Payer: Self-pay | Admitting: *Deleted

## 2017-09-03 LAB — MULTIPLE MYELOMA PANEL, SERUM
Albumin SerPl Elph-Mcnc: 3.1 g/dL (ref 2.9–4.4)
Albumin/Glob SerPl: 1.1 (ref 0.7–1.7)
Alpha 1: 0.3 g/dL (ref 0.0–0.4)
Alpha2 Glob SerPl Elph-Mcnc: 0.8 g/dL (ref 0.4–1.0)
B-GLOBULIN SERPL ELPH-MCNC: 0.9 g/dL (ref 0.7–1.3)
GLOBULIN, TOTAL: 3 g/dL (ref 2.2–3.9)
Gamma Glob SerPl Elph-Mcnc: 1.2 g/dL (ref 0.4–1.8)
IGG (IMMUNOGLOBIN G), SERUM: 1177 mg/dL (ref 700–1600)
IgA: 15 mg/dL — ABNORMAL LOW (ref 64–422)
IgM (Immunoglobulin M), Srm: 38 mg/dL (ref 26–217)
M PROTEIN SERPL ELPH-MCNC: 1 g/dL — AB
TOTAL PROTEIN ELP: 6.1 g/dL (ref 6.0–8.5)

## 2017-09-03 NOTE — Telephone Encounter (Signed)
-----   Message from Cammie Sickle, MD sent at 09/03/2017  4:10 PM EDT ----- Please inform the patient/family-that M protein is slightly elevated at 1.0; that is expected as she is not any treatment.  However I would not recommend any treatment at this time as discussed the last visit.  Follow-up as planned

## 2017-09-03 NOTE — Telephone Encounter (Signed)
Contacted [place] - pt's caregiver informed of test results and plan of care.

## 2017-09-08 DIAGNOSIS — I82409 Acute embolism and thrombosis of unspecified deep veins of unspecified lower extremity: Secondary | ICD-10-CM

## 2017-09-08 DIAGNOSIS — I2699 Other pulmonary embolism without acute cor pulmonale: Secondary | ICD-10-CM

## 2017-09-08 HISTORY — DX: Acute embolism and thrombosis of unspecified deep veins of unspecified lower extremity: I82.409

## 2017-09-08 HISTORY — DX: Other pulmonary embolism without acute cor pulmonale: I26.99

## 2017-09-13 ENCOUNTER — Emergency Department: Payer: Medicare Other

## 2017-09-13 ENCOUNTER — Inpatient Hospital Stay: Payer: Medicare Other

## 2017-09-13 ENCOUNTER — Inpatient Hospital Stay
Admission: EM | Admit: 2017-09-13 | Discharge: 2017-09-16 | DRG: 175 | Disposition: A | Discharge status: Home Health | Payer: Medicare Other | Attending: Specialist | Admitting: Specialist

## 2017-09-13 ENCOUNTER — Other Ambulatory Visit: Payer: Self-pay

## 2017-09-13 DIAGNOSIS — Z7982 Long term (current) use of aspirin: Secondary | ICD-10-CM | POA: Diagnosis not present

## 2017-09-13 DIAGNOSIS — Z7901 Long term (current) use of anticoagulants: Secondary | ICD-10-CM | POA: Diagnosis not present

## 2017-09-13 DIAGNOSIS — I10 Essential (primary) hypertension: Secondary | ICD-10-CM | POA: Diagnosis present

## 2017-09-13 DIAGNOSIS — E785 Hyperlipidemia, unspecified: Secondary | ICD-10-CM | POA: Diagnosis present

## 2017-09-13 DIAGNOSIS — M069 Rheumatoid arthritis, unspecified: Secondary | ICD-10-CM | POA: Diagnosis present

## 2017-09-13 DIAGNOSIS — I2510 Atherosclerotic heart disease of native coronary artery without angina pectoris: Secondary | ICD-10-CM | POA: Diagnosis present

## 2017-09-13 DIAGNOSIS — I824Z2 Acute embolism and thrombosis of unspecified deep veins of left distal lower extremity: Secondary | ICD-10-CM

## 2017-09-13 DIAGNOSIS — I480 Paroxysmal atrial fibrillation: Secondary | ICD-10-CM | POA: Diagnosis not present

## 2017-09-13 DIAGNOSIS — Z888 Allergy status to other drugs, medicaments and biological substances status: Secondary | ICD-10-CM

## 2017-09-13 DIAGNOSIS — K5909 Other constipation: Secondary | ICD-10-CM | POA: Diagnosis present

## 2017-09-13 DIAGNOSIS — F0390 Unspecified dementia without behavioral disturbance: Secondary | ICD-10-CM | POA: Diagnosis not present

## 2017-09-13 DIAGNOSIS — Z885 Allergy status to narcotic agent status: Secondary | ICD-10-CM | POA: Diagnosis not present

## 2017-09-13 DIAGNOSIS — F419 Anxiety disorder, unspecified: Secondary | ICD-10-CM | POA: Diagnosis present

## 2017-09-13 DIAGNOSIS — F0391 Unspecified dementia with behavioral disturbance: Secondary | ICD-10-CM | POA: Diagnosis present

## 2017-09-13 DIAGNOSIS — I82432 Acute embolism and thrombosis of left popliteal vein: Secondary | ICD-10-CM | POA: Diagnosis present

## 2017-09-13 DIAGNOSIS — R7989 Other specified abnormal findings of blood chemistry: Secondary | ICD-10-CM

## 2017-09-13 DIAGNOSIS — Z681 Body mass index (BMI) 19 or less, adult: Secondary | ICD-10-CM | POA: Diagnosis not present

## 2017-09-13 DIAGNOSIS — R778 Other specified abnormalities of plasma proteins: Secondary | ICD-10-CM

## 2017-09-13 DIAGNOSIS — I482 Chronic atrial fibrillation: Secondary | ICD-10-CM | POA: Diagnosis present

## 2017-09-13 DIAGNOSIS — M79605 Pain in left leg: Secondary | ICD-10-CM

## 2017-09-13 DIAGNOSIS — R079 Chest pain, unspecified: Secondary | ICD-10-CM | POA: Diagnosis not present

## 2017-09-13 DIAGNOSIS — Z9225 Personal history of immunosupression therapy: Secondary | ICD-10-CM | POA: Diagnosis not present

## 2017-09-13 DIAGNOSIS — Z66 Do not resuscitate: Secondary | ICD-10-CM | POA: Diagnosis present

## 2017-09-13 DIAGNOSIS — I2699 Other pulmonary embolism without acute cor pulmonale: Principal | ICD-10-CM

## 2017-09-13 DIAGNOSIS — G893 Neoplasm related pain (acute) (chronic): Secondary | ICD-10-CM | POA: Diagnosis present

## 2017-09-13 DIAGNOSIS — Z79891 Long term (current) use of opiate analgesic: Secondary | ICD-10-CM | POA: Diagnosis not present

## 2017-09-13 DIAGNOSIS — E43 Unspecified severe protein-calorie malnutrition: Secondary | ICD-10-CM | POA: Diagnosis present

## 2017-09-13 DIAGNOSIS — C9000 Multiple myeloma not having achieved remission: Secondary | ICD-10-CM | POA: Diagnosis present

## 2017-09-13 DIAGNOSIS — I82412 Acute embolism and thrombosis of left femoral vein: Secondary | ICD-10-CM | POA: Diagnosis present

## 2017-09-13 DIAGNOSIS — Z882 Allergy status to sulfonamides status: Secondary | ICD-10-CM | POA: Diagnosis not present

## 2017-09-13 DIAGNOSIS — Z79899 Other long term (current) drug therapy: Secondary | ICD-10-CM | POA: Diagnosis not present

## 2017-09-13 DIAGNOSIS — M7989 Other specified soft tissue disorders: Secondary | ICD-10-CM

## 2017-09-13 LAB — BASIC METABOLIC PANEL
Anion gap: 9 (ref 5–15)
BUN: 34 mg/dL — AB (ref 6–20)
CHLORIDE: 107 mmol/L (ref 101–111)
CO2: 22 mmol/L (ref 22–32)
Calcium: 8.5 mg/dL — ABNORMAL LOW (ref 8.9–10.3)
Creatinine, Ser: 0.75 mg/dL (ref 0.44–1.00)
GFR calc Af Amer: >60 mL/min (ref >60)
GFR calc non Af Amer: >60 mL/min (ref >60)
Glucose, Bld: 86 mg/dL (ref 65–99)
POTASSIUM: 4.3 mmol/L (ref 3.5–5.1)
SODIUM: 138 mmol/L (ref 135–145)

## 2017-09-13 LAB — PROTIME-INR
INR: 1.04
Prothrombin Time: 13.5 seconds (ref 11.4–15.2)

## 2017-09-13 LAB — CBC
HEMATOCRIT: 39.6 % (ref 35.0–47.0)
Hemoglobin: 12.8 g/dL (ref 12.0–16.0)
MCH: 29.1 pg (ref 26.0–34.0)
MCHC: 32.2 g/dL (ref 32.0–36.0)
MCV: 90.3 fL (ref 80.0–100.0)
Platelets: 337 10*3/uL (ref 150–440)
RBC: 4.39 MIL/uL (ref 3.80–5.20)
RDW: 16.1 % — AB (ref 11.5–14.5)
WBC: 8.9 10*3/uL (ref 3.6–11.0)

## 2017-09-13 LAB — URINALYSIS, COMPLETE (UACMP) WITH MICROSCOPIC
BILIRUBIN URINE: NEGATIVE
Glucose, UA: NEGATIVE mg/dL
Hgb urine dipstick: NEGATIVE
KETONES UR: 5 mg/dL — AB
LEUKOCYTES UA: NEGATIVE
Nitrite: NEGATIVE
Protein, ur: 30 mg/dL — AB
Specific Gravity, Urine: 1.025 (ref 1.005–1.030)
pH: 6 (ref 5.0–8.0)

## 2017-09-13 LAB — TROPONIN I
TROPONIN I: 0.22 ng/mL — AB (ref <0.03)
TROPONIN I: 0.19 ng/mL — AB (ref <0.03)

## 2017-09-13 LAB — APTT: aPTT: 27 seconds (ref 24–36)

## 2017-09-13 LAB — MRSA PCR SCREENING: MRSA BY PCR: NEGATIVE

## 2017-09-13 MED ORDER — BISACODYL 10 MG RE SUPP
10.0000 mg | Freq: Every day | RECTAL | Status: DC
  Administered 2017-09-14 – 2017-09-16 (×3): 10 mg via RECTAL
  Filled 2017-09-13 (×3): qty 1

## 2017-09-13 MED ORDER — MELATONIN 5 MG PO TABS
1.0000 | ORAL_TABLET | Freq: Every day | ORAL | Status: DC
  Filled 2017-09-13 (×2): qty 1

## 2017-09-13 MED ORDER — SODIUM CHLORIDE 0.9 % IV BOLUS
500.0000 mL | Freq: Once | INTRAVENOUS | Status: AC
  Administered 2017-09-13: 500 mL via INTRAVENOUS

## 2017-09-13 MED ORDER — HEPARIN BOLUS VIA INFUSION
2250.0000 [IU] | Freq: Once | INTRAVENOUS | Status: AC
  Administered 2017-09-13: 2250 [IU] via INTRAVENOUS
  Filled 2017-09-13: qty 2250

## 2017-09-13 MED ORDER — ONDANSETRON HCL 4 MG/2ML IJ SOLN: 4.0000 mg | Freq: Four times a day (QID) | INTRAMUSCULAR | Status: DC | PRN

## 2017-09-13 MED ORDER — ASPIRIN EC 81 MG PO TBEC
81.0000 mg | DELAYED_RELEASE_TABLET | Freq: Every day | ORAL | Status: DC
  Administered 2017-09-14 – 2017-09-15 (×2): 81 mg via ORAL
  Filled 2017-09-13 (×2): qty 1

## 2017-09-13 MED ORDER — HEPARIN (PORCINE) IN NACL 100-0.45 UNIT/ML-% IJ SOLN
900.0000 [IU]/h | INTRAMUSCULAR | Status: DC
  Administered 2017-09-13: 700 [IU]/h via INTRAVENOUS
  Administered 2017-09-15: 900 [IU]/h via INTRAVENOUS
  Filled 2017-09-13 (×2): qty 250

## 2017-09-13 MED ORDER — IOHEXOL 350 MG/ML SOLN
75.0000 mL | Freq: Once | INTRAVENOUS | Status: AC | PRN
  Administered 2017-09-13: 50 mL via INTRAVENOUS

## 2017-09-13 MED ORDER — MIRTAZAPINE 15 MG PO TABS
30.0000 mg | ORAL_TABLET | Freq: Every day | ORAL | Status: DC
  Administered 2017-09-15: 30 mg via ORAL
  Filled 2017-09-13 (×3): qty 2

## 2017-09-13 MED ORDER — ACYCLOVIR 200 MG PO CAPS
400.0000 mg | ORAL_CAPSULE | Freq: Every day | ORAL | Status: DC
  Administered 2017-09-14 – 2017-09-15 (×2): 400 mg via ORAL
  Filled 2017-09-13 (×4): qty 2

## 2017-09-13 MED ORDER — POLYETHYLENE GLYCOL 3350 17 G PO PACK
17.0000 g | PACK | Freq: Every day | ORAL | Status: DC
  Administered 2017-09-14 – 2017-09-16 (×3): 17 g via ORAL
  Filled 2017-09-13 (×3): qty 1

## 2017-09-13 MED ORDER — ONDANSETRON HCL 4 MG PO TABS
4.0000 mg | ORAL_TABLET | Freq: Four times a day (QID) | ORAL | Status: DC | PRN
Start: 2017-09-13 — End: 2017-09-16

## 2017-09-13 MED ORDER — BISACODYL 5 MG PO TBEC: 5.0000 mg | DELAYED_RELEASE_TABLET | Freq: Every day | ORAL | Status: DC | PRN

## 2017-09-13 MED ORDER — OXYCODONE HCL 5 MG PO TABS
2.5000 mg | ORAL_TABLET | Freq: Four times a day (QID) | ORAL | Status: DC | PRN
  Administered 2017-09-15 – 2017-09-16 (×2): 2.5 mg via ORAL
  Filled 2017-09-13 (×3): qty 1

## 2017-09-13 MED ORDER — DOCUSATE SODIUM 100 MG PO CAPS
100.0000 mg | ORAL_CAPSULE | Freq: Two times a day (BID) | ORAL | Status: DC
  Administered 2017-09-14 – 2017-09-16 (×4): 100 mg via ORAL
  Filled 2017-09-13 (×5): qty 1

## 2017-09-13 MED ORDER — POLYETHYLENE GLYCOL 3350 17 G PO PACK
17.0000 g | PACK | Freq: Every evening | ORAL | Status: DC | PRN
Start: 2017-09-13 — End: 2017-09-16

## 2017-09-13 MED ORDER — DIVALPROEX SODIUM 125 MG PO CSDR
125.0000 mg | DELAYED_RELEASE_CAPSULE | Freq: Every day | ORAL | Status: DC
  Administered 2017-09-14 – 2017-09-16 (×3): 125 mg via ORAL
  Filled 2017-09-13 (×4): qty 1

## 2017-09-13 MED ORDER — RISPERIDONE 1 MG PO TABS
1.0000 mg | ORAL_TABLET | Freq: Every day | ORAL | Status: DC
  Administered 2017-09-15: 1 mg via ORAL
  Filled 2017-09-13 (×4): qty 1

## 2017-09-13 NOTE — ED Triage Notes (Signed)
Pt arrives with family members who states weakness, fatigue, constipation (no BM in 3 days. Small one yesterday "rabbit pellets.", streaks of stool, c/o pain when wiping). Also c/o L leg swelling. Denies hx of CHF.

## 2017-09-13 NOTE — ED Provider Notes (Signed)
[facility] Emergency Department Provider Note  ____________________________________________   I have reviewed the triage vital signs and the nursing notes. Where available I have reviewed prior notes and, if possible and indicated, outside hospital notes.    HISTORY  Chief Complaint Weakness    HPI [name] is a 84 y.o. female with a history of anxiety atrial fibrillation CAD blood transfusions in the past, multiple myeloma chronic constipation chronic narcotic use, recently placed in a skilled nursing facility.  Family are worried about several things.  Her left lower extremity they noticed is been swollen.  This is been going on possibly for a few days and not sure patient herself cannot give much history but is at her baseline neurologically.  Level 5 chart caveat; no further history available due to patient status.  They are also concerned about the patient's the patient she takes MiraLAX usually every day at home but since she has been at the facility, she has not been getting MiraLAX as regularly, and she is having small hard bowel movements.  She has no complete of abdominal pain or vomiting.  Finally, they feel that she is generally weak.      Past Medical History:  Diagnosis Date  . Anxiety   . Arthritis   . Atrial fibrillation ([place])   . Coronary arteriosclerosis   . History of blood transfusion 2014  . Hyperlipidemia   . Hypertension   . Multiple myeloma ([place])   . Multiple myeloma in relapse ([place]) 10/29/2014  . 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 HCAP (healthcare-associated pneumonia) 08/07/2017  . Left leg weakness 08/04/2017  . Dehydration 02/19/2017  . Pain in right hip 12/28/2015  . Pain in lower back 12/28/2015  . Rheumatoid arthritis ([place]) 03/29/2015  . Inflammatory polyarthropathy ([place]) 03/29/2015  . Multiple myeloma not having achieved remission ([place]) 10/29/2014   . Absolute anemia 10/24/2014  . CAD in native artery 10/24/2014  . Benign essential HTN 10/24/2014  . MI (mitral incompetence) 05/25/2014  . AF (paroxysmal atrial fibrillation) ([place]) 05/25/2014  . Paroxysmal atrial fibrillation ([place]) 05/25/2014    Past Surgical History:  Procedure Laterality Date  . ABDOMINAL HYSTERECTOMY    . APPENDECTOMY    . PARTIAL COLECTOMY    . TOTAL KNEE REVISION Left     Prior to Admission medications   Medication Sig Start Date End Date Taking? Authorizing Provider  acyclovir (ZOVIRAX) 400 MG tablet TAKE 1 TABLET BY MOUTH ONCE DAILY TO  PREVENT  SHINGLES Patient taking differently: Take 400 mg by mouth at bedtime. TAKE 1 TABLET BY MOUTH ONCE DAILY TO  PREVENT  SHINGLES 04/25/17  Yes Cammie Sickle, MD  aspirin EC 81 MG tablet Take 1 tablet (81 mg total) by mouth daily. 08/06/17 10/05/17 Yes Sainani, Belia Heman, MD  divalproex (DEPAKOTE SPRINKLE) 125 MG capsule Take 1 capsule by mouth daily. 08/31/17  Yes [provider]  Melatonin 5 MG TABS Take 1 tablet by mouth daily.   Yes [provider]  mirtazapine (REMERON) 30 MG tablet TAKE 1 TABLET BY MOUTH AT BEDTIME 08/01/17  Yes Cammie Sickle, MD  risperiDONE (RISPERDAL) 1 MG tablet Take 1 tablet (1 mg total) by mouth at bedtime. 06/19/17  Yes Cammie Sickle, MD  bisacodyl (DULCOLAX) 5 MG EC tablet Take 1 tablet (5 mg total) by mouth daily as needed for moderate constipation. 08/11/17   Vianne Bulls,  Snehalatha, MD  Docusate Sodium (COLACE PO) Take 2 tablets by mouth at bedtime.     [provider]  fentaNYL (DURAGESIC - DOSED MCG/HR) 25 MCG/HR patch Place 1 patch (25 mcg total) onto the skin every 3 (three) days. Patient not taking: Reported on 09/13/2017 08/11/17   Epifanio Lesches, MD  megestrol (MEGACE) 40 MG tablet Take 2 tablets (80 mg total) by mouth 2 (two) times daily. Patient not taking: Reported on 09/13/2017 09/01/17   Cammie Sickle, MD  oxyCODONE (OXY  IR/ROXICODONE) 5 MG immediate release tablet 1/2 pill [2.5 mg] every 8 hours as needed for pain Patient taking differently: Take 2.5 mg by mouth every 6 (six) hours as needed for moderate pain.  09/01/17   Cammie Sickle, MD  polyethylene glycol (MIRALAX / GLYCOLAX) packet Take 17 g by mouth at bedtime as needed for mild constipation.     [provider]    Allergies Biaxin [clarithromycin]; Demerol [meperidine]; Hydrocodone; Sulfa antibiotics; Tramadol; and Ultracet [tramadol-acetaminophen]  Family History  Problem Relation Age of Onset  . Cancer Father   . Heart attack Mother     Social History Social History   Tobacco Use  . Smoking status: Never Smoker  . Smokeless tobacco: Never Used  Substance Use Topics  . Alcohol use: Yes    Comment: occasional  . Drug use: No    Review of Systems Constitutional: No fever/chills Eyes: No visual changes. ENT: No sore throat. No stiff neck no neck pain Cardiovascular: Denies chest pain. Respiratory: Denies shortness of breath. Gastrointestinal:   no vomiting.  No diarrhea.  + constipation. Genitourinary: Negative for dysuria. Musculoskeletal: + lower extremity swelling Skin: Negative for rash. Neurological: Negative for severe headaches, focal weakness or numbness.   ____________________________________________   PHYSICAL EXAM:  VITAL SIGNS: ED Triage Vitals  Enc Vitals Group     BP 09/13/17 1520 (!) 134/58     Pulse Rate 09/13/17 1520 100     Resp 09/13/17 1520 16     Temp 09/13/17 1520 98.2 F (36.8 C)     Temp Source 09/13/17 1520 Oral     SpO2 09/13/17 1520 98 %     Weight 09/13/17 1521 99 lb (44.9 kg)     Height 09/13/17 1521 5' (1.524 m)     Head Circumference --      Peak Flow --      Pain Score 09/13/17 1520 0     Pain Loc --      Pain Edu? --      Excl. in [place]? --     Constitutional: Alert and oriented name unsure of the date, elderly frail-appearing woman in no acute distress.  Eyes:  Conjunctivae are normal Head: Atraumatic HEENT: No congestion/rhinnorhea. Mucous membranes are moist.  Oropharynx non-erythematous Neck:   Nontender with no meningismus, no masses, no stridor Cardiovascular: Normal rate, regular rhythm. Grossly normal heart sounds.  Good peripheral circulation. Respiratory: Normal respiratory effort.  No retractions. Lungs CTAB. Abdominal: Soft and nontender. No distention. No guarding no rebound Back:  There is no focal tenderness or step off.  there is no midline tenderness there are no lesions noted. there is no CVA tenderness Musculoskeletal: No lower extremity tenderness, no upper extremity tenderness. No joint effusions, there is swelling to the left lower extremity, both feet are equal in temperature, decreased dorsal pedal pulses, but positive posterior tibial pulses noted.   Neurologic:  Normal speech and language. No gross focal neurologic deficits are appreciated.  Skin:  Skin is warm, dry and intact. No rash noted. Psychiatric: Mood and affect are normal. Speech and behavior are normal.  ____________________________________________   LABS (all labs ordered are listed, but only abnormal results are displayed)  Labs Reviewed  BASIC METABOLIC PANEL - Abnormal; Notable for the following components:      Result Value   BUN 34 (*)    Calcium 8.5 (*)    All other components within normal limits  CBC - Abnormal; Notable for the following components:   RDW 16.1 (*)    All other components within normal limits  URINALYSIS, COMPLETE (UACMP) WITH MICROSCOPIC - Abnormal; Notable for the following components:   Color, Urine YELLOW (*)    APPearance HAZY (*)    Ketones, ur 5 (*)    Protein, ur 30 (*)    Bacteria, UA RARE (*)    Squamous Epithelial / LPF 0-5 (*)    All other components within normal limits  TROPONIN I - Abnormal; Notable for the following components:   Troponin I 0.22 (*)    All other components within normal limits  CBG MONITORING,  ED    Pertinent labs  results that were available during my care of the patient were reviewed by me and considered in my medical decision making (see chart for details). ____________________________________________  EKG  I personally interpreted any EKGs ordered by me or triage Sinus rhythm, rate 99 bpm, nonspecific ST changes no acute ischemia noted normal axis ____________________________________________  RADIOLOGY  Pertinent labs & imaging results that were available during my care of the patient were reviewed by me and considered in my medical decision making (see chart for details). If possible, patient and/or family made aware of any abnormal findings.  Dg Abdomen 1 View  Result Date: 09/13/2017 CLINICAL DATA:  Abdominal pain EXAM: ABDOMEN - 1 VIEW COMPARISON:  12/13/2016 FINDINGS: Scattered large and small bowel gas is noted. Mild retained fecal material is noted without obstructive change. Chronic compression deformity of L1 is noted. Degenerative changes of lumbar spine are seen. No free air is noted. IMPRESSION: Chronic changes without acute abnormality. Electronically Signed   By: Inez Catalina M.D.   On: 09/13/2017 19:20   ____________________________________________    PROCEDURES  Procedure(s) performed: None  Procedures  Critical Care performed: None  ____________________________________________   INITIAL IMPRESSION / ASSESSMENT AND PLAN / ED COURSE  Pertinent labs & imaging results that were available during my care of the patient were reviewed by me and considered in my medical decision making (see chart for details). Patient here with multiple different complaints, feeling generally weak, constipation in the context of narcotic use and insufficient MiraLAX, and leg swelling.  Of these of course the most concerning to me is her lower extremity swelling.  Patient does have signs and symptoms of a DVT, I did an ultrasound ultrasound tech just inform me that there is a  large DVT in that left leg.  We are going to therefore start heparin.  Her troponin is 0.22 she is not complaining of chest pain or shortness of breath although she has a very limited historian, obtain a CT scan after discussing with the hospitalist to ensure that she does not have a PE as I suspect she actually does.  Either way the ER treatment is going to be heparin but they think it would help him with diagnostics and disposition in terms of length of anticoagulation in a patient who is a risk for fall.  Patient  is in no acute distress here.  She has no evidence of a bowel obstruction her abdomen is benign, her lungs are clear and her urine and the rest of her blood work is reassuring.  She will be admitted to the hospitalist service.    ____________________________________________   FINAL CLINICAL IMPRESSION(S) / ED DIAGNOSES  Final diagnoses:  Troponin I above reference range      This chart was dictated using voice recognition software.  Despite best efforts to proofread,  errors can occur which can change meaning.      Schuyler Amor, MD 09/13/17 2018

## 2017-09-13 NOTE — H&P (Signed)
[facility] [place] NAME: [name]    MR#:  [id number]  DATE OF BIRTH:  1925-01-17  DATE OF ADMISSION:  09/13/2017  PRIMARY CARE PHYSICIAN: Shari Prows, [facility]   REQUESTING/REFERRING PHYSICIAN: Schuyler Amor, MD  CHIEF COMPLAINT:  Generalized weakness abdominal pain and chest pain also reporting swelling of the left leg  HISTORY OF PRESENT ILLNESS:  [name]  is a 84 y.o. female with a known history of multiple myeloma, anxiety, dementia, chronic atrial fibrillation, coronary artery disease is brought in from [place] memory care unit from [place] area by her son.  Son is concerned as the patient's left leg is swollen and painful, patient is also complaining of abdominal pain and constipated.  Intermittently reporting chest pain and troponin is elevated.  Left lower extremity venous Dopplers has revealed acute DVT.  Patient has history of multiple myeloma and sees Dr. Yevette Edwards as an outpatient and her chemotherapy was recently stopped in January  PAST MEDICAL HISTORY:   Past Medical History:  Diagnosis Date  . Anxiety   . Arthritis   . Atrial fibrillation ([place])   . Coronary arteriosclerosis   . History of blood transfusion 2014  . Hyperlipidemia   . Hypertension   . Multiple myeloma ([place])   . Multiple myeloma in relapse ([place]) 10/29/2014  . Osteonecrosis of jaw ([place])    left    PAST SURGICAL HISTOIRY:   Past Surgical History:  Procedure Laterality Date  . ABDOMINAL HYSTERECTOMY    . APPENDECTOMY    . PARTIAL COLECTOMY    . TOTAL KNEE REVISION Left     SOCIAL HISTORY:   Social History   Tobacco Use  . Smoking status: Never Smoker  . Smokeless tobacco: Never Used  Substance Use Topics  . Alcohol use: Yes    Comment: occasional    FAMILY HISTORY:   Family History  Problem Relation Age of Onset  . Cancer Father   . Heart attack Mother     DRUG ALLERGIES:   Allergies  Allergen  Reactions  . Biaxin [Clarithromycin] Diarrhea and Nausea And Vomiting  . Demerol [Meperidine] Nausea And Vomiting  . Hydrocodone Nausea And Vomiting  . Sulfa Antibiotics Hives  . Tramadol Diarrhea and Nausea And Vomiting  . Ultracet [Tramadol-Acetaminophen] Other (See Comments)    REVIEW OF SYSTEMS:  Review of system unobtainable as the patient is demented and poor historian  MEDICATIONS AT HOME:   Prior to Admission medications   Medication Sig Start Date End Date Taking? Authorizing Provider  acyclovir (ZOVIRAX) 400 MG tablet TAKE 1 TABLET BY MOUTH ONCE DAILY TO  PREVENT  SHINGLES Patient taking differently: Take 400 mg by mouth at bedtime. TAKE 1 TABLET BY MOUTH ONCE DAILY TO  PREVENT  SHINGLES 04/25/17  Yes Cammie Sickle, MD  aspirin EC 81 MG tablet Take 1 tablet (81 mg total) by mouth daily. 08/06/17 10/05/17 Yes Sainani, Belia Heman, MD  divalproex (DEPAKOTE SPRINKLE) 125 MG capsule Take 1 capsule by mouth daily. 08/31/17  Yes [provider]  Melatonin 5 MG TABS Take 1 tablet by mouth daily.   Yes [provider]  mirtazapine (REMERON) 30 MG tablet TAKE 1 TABLET BY MOUTH AT BEDTIME 08/01/17  Yes Cammie Sickle, MD  risperiDONE (RISPERDAL) 1 MG tablet Take 1 tablet (1 mg total) by mouth at bedtime. 06/19/17  Yes Cammie Sickle, MD  bisacodyl (DULCOLAX) 5 MG EC tablet Take 1 tablet (  5 mg total) by mouth daily as needed for moderate constipation. 08/11/17   Epifanio Lesches, MD  Docusate Sodium (COLACE PO) Take 2 tablets by mouth at bedtime.     [provider]  fentaNYL (DURAGESIC - DOSED MCG/HR) 25 MCG/HR patch Place 1 patch (25 mcg total) onto the skin every 3 (three) days. Patient not taking: Reported on 09/13/2017 08/11/17   Epifanio Lesches, MD  megestrol (MEGACE) 40 MG tablet Take 2 tablets (80 mg total) by mouth 2 (two) times daily. Patient not taking: Reported on 09/13/2017 09/01/17   Cammie Sickle, MD  oxyCODONE (OXY  IR/ROXICODONE) 5 MG immediate release tablet 1/2 pill [2.5 mg] every 8 hours as needed for pain Patient taking differently: Take 2.5 mg by mouth every 6 (six) hours as needed for moderate pain.  09/01/17   Cammie Sickle, MD  polyethylene glycol (MIRALAX / GLYCOLAX) packet Take 17 g by mouth at bedtime as needed for mild constipation.     [provider]      VITAL SIGNS:  Blood pressure 108/77, pulse 92, temperature 98.2 F (36.8 C), temperature source Oral, resp. rate 19, height 5' (1.524 m), weight 44.9 kg (99 lb), SpO2 93 %.  PHYSICAL EXAMINATION:  GENERAL:  84 y.o.-year-old patient lying in the bed with no acute distress.  EYES: Pupils equal, round, reactive to light and accommodation. No scleral icterus. Extraocular muscles intact.  HEENT: Head atraumatic, normocephalic. Oropharynx and nasopharynx clear.  NECK:  Supple, no jugular venous distention. No thyroid enlargement, no tenderness.  LUNGS: Normal breath sounds bilaterally, no wheezing, rales,rhonchi or crepitation. No use of accessory muscles of respiration.  Tachypneic CARDIOVASCULAR: S1, S2 normal. No murmurs, rubs, or gallops.  ABDOMEN: Soft, nontender, nondistended. Bowel sounds present. No organomegaly or mass.  EXTREMITIES: Left leg is tender, edematous and erythematous.  NEUROLOGIC: Awake and alert but disoriented from dementia PSYCHIATRIC: The patient is alert and disoriented SKIN: No obvious rash, lesion, or ulcer.   LABORATORY PANEL:   CBC Recent Labs  Lab 09/13/17 1529  WBC 8.9  HGB 12.8  HCT 39.6  PLT 337   ------------------------------------------------------------------------------------------------------------------  Chemistries  Recent Labs  Lab 09/13/17 1529  NA 138  K 4.3  CL 107  CO2 22  GLUCOSE 86  BUN 34*  CREATININE 0.75  CALCIUM 8.5*   ------------------------------------------------------------------------------------------------------------------  Cardiac  Enzymes Recent Labs  Lab 09/13/17 1529  TROPONINI 0.22*   ------------------------------------------------------------------------------------------------------------------  RADIOLOGY:  Dg Chest 2 View  Result Date: 09/13/2017 CLINICAL DATA:  Weakness and known DVT EXAM: CHEST - 2 VIEW COMPARISON:  09/01/2017 FINDINGS: Cardiac shadow is stable. Aortic calcifications are noted. Right chest wall port is seen in satisfactory position. The lungs are clear. Calcified granuloma in the left base is again noted. No acute bony abnormality is seen. Chronic compression deformity at the thoracolumbar junction is noted. IMPRESSION: Chronic changes without acute abnormality. Electronically Signed   By: Inez Catalina M.D.   On: 09/13/2017 20:30   Dg Abdomen 1 View  Result Date: 09/13/2017 CLINICAL DATA:  Abdominal pain EXAM: ABDOMEN - 1 VIEW COMPARISON:  12/13/2016 FINDINGS: Scattered large and small bowel gas is noted. Mild retained fecal material is noted without obstructive change. Chronic compression deformity of L1 is noted. Degenerative changes of lumbar spine are seen. No free air is noted. IMPRESSION: Chronic changes without acute abnormality. Electronically Signed   By: Inez Catalina M.D.   On: 09/13/2017 19:20   US Venous Img Lower Unilateral Left  Result Date:  09/13/2017 CLINICAL DATA:  Left lower extremity pain and swelling for 2 days EXAM: LEFT LOWER EXTREMITY VENOUS DOPPLER ULTRASOUND TECHNIQUE: Gray-scale sonography with graded compression, as well as color Doppler and duplex ultrasound were performed to evaluate the lower extremity deep venous systems from the level of the common femoral vein and including the common femoral, femoral, profunda femoral, popliteal and calf veins including the posterior tibial, peroneal and gastrocnemius veins when visible. The superficial great saphenous vein was also interrogated. Spectral Doppler was utilized to evaluate flow at rest and with distal augmentation  maneuvers in the common femoral, femoral and popliteal veins. COMPARISON:  None. FINDINGS: Contralateral Common Femoral Vein: Respiratory phasicity is normal and symmetric with the symptomatic side. No evidence of thrombus. Normal compressibility. Common Femoral Vein: No evidence of thrombus. Normal compressibility, respiratory phasicity and response to augmentation. Saphenofemoral Junction: No evidence of thrombus. Normal compressibility and flow on color Doppler imaging. Profunda Femoral Vein: No evidence of thrombus. Normal compressibility and flow on color Doppler imaging. Femoral Vein: Thrombus is noted with decreased compressibility Popliteal Vein: Thrombus is noted with decreased compressibility. Calf Veins: Thrombus is noted with decreased compressibility. Superficial Great Saphenous Vein: No evidence of thrombus. Normal compressibility. Venous Reflux:  None. Other Findings:  None. IMPRESSION: Diffuse deep venous thrombosis from the calf veins extending into the popliteal and superficial femoral vein. Electronically Signed   By: Inez Catalina M.D.   On: 09/13/2017 20:19    EKG:   Orders placed or performed during the hospital encounter of 09/13/17  . ED EKG  . ED EKG    IMPRESSION AND PLAN:  [name]  is a 84 y.o. female with a known history of multiple myeloma, anxiety, dementia, chronic atrial fibrillation, coronary artery disease is brought in from [place] memory care unit from [place] area by her son.  Son is concerned as the patient's left leg is swollen and painful, patient is also complaining of abdominal pain and constipated.  Intermittently reporting chest pain and troponin is elevated.  Left lower extremity venous Dopplers has revealed acute DVT.   #Acute left lower extremity DVT Telemetry monitoring Heparin bolus and drip Pain management as needed Will consider changing heparin to Eliquis or Xarelto A CT angiogram of the chest is negative for pulmonary embolism  rounding physician might consider to consult vascular surgery for possible IVC filter placement  #Chest pain with elevated troponin and tachypnea rule out pulmonary embolism CT angiogram of the chest is ordered patient is already on heparin drip  #Elevated troponin could be from underlying PE versus demand ischemia monitor patient on telemetry, Continue aspirin Get CT angiogram of the chest to rule out pulmonary embolism  #Abdominal pain probably from constipation Colace and MiraLAX Dulcolax suppository if no improvement Soapsuds enema as needed  #Essential hypertension Controlled without any medications continue monitoring  #Dementia patient is from-[facility] memory care unit   All the records are reviewed and case discussed with ED provider. Management plans discussed with the patient, family and they are in agreement.  CODE STATUS: DNR   TOTAL TIME TAKING CARE OF THIS PATIENT: 43 minutes.   Note: This dictation was prepared with Dragon dictation along with smaller phrase technology. Any transcriptional errors that result from this process are unintentional.  Nicholes Mango M.D on 09/13/2017 at 8:57 PM  Between 7am to 6pm - Pager - [phone]  After 6pm go to [email or web address] - password [email or web address] [facility]  [facility]  Office  [phone]  CC: Primary care physician;  [place], [facility]

## 2017-09-13 NOTE — ED Notes (Signed)
Patient transported to Ultrasound 

## 2017-09-13 NOTE — Progress Notes (Signed)
[facility] for Heparin Indication: DVT/ possible PE  Allergies  Allergen Reactions  . Biaxin [Clarithromycin] Diarrhea and Nausea And Vomiting  . Demerol [Meperidine] Nausea And Vomiting  . Hydrocodone Nausea And Vomiting  . Sulfa Antibiotics Hives  . Tramadol Diarrhea and Nausea And Vomiting  . Ultracet [Tramadol-Acetaminophen] Other (See Comments)    Patient Measurements: Height: 5' (152.4 cm) Weight: 99 lb (44.9 kg) IBW/kg (Calculated) : 45.5  Vital Signs: Temp: 98.2 F (36.8 C) (04/06 1520) Temp Source: Oral (04/06 1520) BP: 120/86 (04/06 1930) Pulse Rate: 100 (04/06 1930)  Labs: Recent Labs    09/13/17 1529  HGB 12.8  HCT 39.6  PLT 337  CREATININE 0.75  TROPONINI 0.22*    Estimated Creatinine Clearance: 33.1 mL/min (by C-G formula based on SCr of 0.75 mg/dL).   Medical History: Past Medical History:  Diagnosis Date  . Anxiety   . Arthritis   . Atrial fibrillation ([place])   . Coronary arteriosclerosis   . History of blood transfusion 2014  . Hyperlipidemia   . Hypertension   . Multiple myeloma ([place])   . Multiple myeloma in relapse ([place]) 10/29/2014  . Osteonecrosis of jaw ([place])    left   Assessment: 84 y/o F with a h/o multiple myeoloma and atrial fibrillation on ASA for anticoagulation admitted with DVT and possible PE.  Goal of Therapy:  Heparin level 0.3-0.7 units/ml Monitor platelets by anticoagulation protocol: Yes   Plan:  Give 2250 units bolus x 1 Start heparin infusion at 700 units/hr Check anti-Xa level in 8 hours and daily while on heparin Continue to monitor H&H and platelets  Ulice Dash D 09/13/2017,8:19 PM

## 2017-09-13 NOTE — Progress Notes (Signed)
Family Meeting Note  Advance Directive:yes  Today a meeting took place with the Patient, son at bed side  Patient is unable to participate due QP:RFFMBW capacity dementia   The following clinical team members were present during this meeting:MD  The following were discussed:Patient's diagnosis: Left lower extremity DVT, elevated troponin, chest pain, history of multiple myeloma, abdominal pain, constipation, treatment and plan of care were discussed in detail with the patient and her son at bedside.  They verbalized understanding of the plan, Patient's progosis: Unable to determine and Goals for treatment: DNR, son Merry Proud is HCPOA  Additional follow-up to be provided: Hospitalist and oncology  Time spent during discussion:18 MIN  Nicholes Mango, MD

## 2017-09-13 NOTE — ED Notes (Signed)
Admitting EDP at bedside.

## 2017-09-13 NOTE — ED Notes (Signed)
Report to Adrian, RN

## 2017-09-13 NOTE — ED Notes (Signed)
ED Provider at bedside. 

## 2017-09-14 DIAGNOSIS — F0390 Unspecified dementia without behavioral disturbance: Secondary | ICD-10-CM

## 2017-09-14 DIAGNOSIS — M069 Rheumatoid arthritis, unspecified: Secondary | ICD-10-CM

## 2017-09-14 DIAGNOSIS — C9000 Multiple myeloma not having achieved remission: Secondary | ICD-10-CM

## 2017-09-14 DIAGNOSIS — I2510 Atherosclerotic heart disease of native coronary artery without angina pectoris: Secondary | ICD-10-CM

## 2017-09-14 DIAGNOSIS — I2699 Other pulmonary embolism without acute cor pulmonale: Secondary | ICD-10-CM

## 2017-09-14 DIAGNOSIS — E785 Hyperlipidemia, unspecified: Secondary | ICD-10-CM

## 2017-09-14 DIAGNOSIS — Z9225 Personal history of immunosupression therapy: Secondary | ICD-10-CM

## 2017-09-14 DIAGNOSIS — I824Z2 Acute embolism and thrombosis of unspecified deep veins of left distal lower extremity: Secondary | ICD-10-CM

## 2017-09-14 DIAGNOSIS — Z8701 Personal history of pneumonia (recurrent): Secondary | ICD-10-CM

## 2017-09-14 DIAGNOSIS — I10 Essential (primary) hypertension: Secondary | ICD-10-CM

## 2017-09-14 DIAGNOSIS — Z79899 Other long term (current) drug therapy: Secondary | ICD-10-CM

## 2017-09-14 DIAGNOSIS — Z7901 Long term (current) use of anticoagulants: Secondary | ICD-10-CM

## 2017-09-14 DIAGNOSIS — I480 Paroxysmal atrial fibrillation: Secondary | ICD-10-CM

## 2017-09-14 LAB — BASIC METABOLIC PANEL
Anion gap: 6 (ref 5–15)
BUN: 33 mg/dL — ABNORMAL HIGH (ref 6–20)
CALCIUM: 7.6 mg/dL — AB (ref 8.9–10.3)
CO2: 22 mmol/L (ref 22–32)
CREATININE: 0.79 mg/dL (ref 0.44–1.00)
Chloride: 109 mmol/L (ref 101–111)
Glucose, Bld: 105 mg/dL — ABNORMAL HIGH (ref 65–99)
Potassium: 3.7 mmol/L (ref 3.5–5.1)
SODIUM: 137 mmol/L (ref 135–145)

## 2017-09-14 LAB — TROPONIN I
TROPONIN I: 0.14 ng/mL — AB (ref <0.03)
TROPONIN I: 0.09 ng/mL — AB (ref <0.03)

## 2017-09-14 LAB — CBC
HCT: 32.8 % — ABNORMAL LOW (ref 35.0–47.0)
Hemoglobin: 10.9 g/dL — ABNORMAL LOW (ref 12.0–16.0)
MCH: 29.6 pg (ref 26.0–34.0)
MCHC: 33.3 g/dL (ref 32.0–36.0)
MCV: 88.9 fL (ref 80.0–100.0)
Platelets: 266 10*3/uL (ref 150–440)
RBC: 3.69 MIL/uL — ABNORMAL LOW (ref 3.80–5.20)
RDW: 15.6 % — AB (ref 11.5–14.5)
WBC: 6.6 10*3/uL (ref 3.6–11.0)

## 2017-09-14 LAB — HEPARIN LEVEL (UNFRACTIONATED)
HEPARIN UNFRACTIONATED: 0.23 [IU]/mL — AB (ref 0.30–0.70)
HEPARIN UNFRACTIONATED: 0.29 [IU]/mL — AB (ref 0.30–0.70)

## 2017-09-14 MED ORDER — HEPARIN BOLUS VIA INFUSION
700.0000 [IU] | Freq: Once | INTRAVENOUS | Status: AC
  Administered 2017-09-14: 700 [IU] via INTRAVENOUS
  Filled 2017-09-14: qty 700

## 2017-09-14 NOTE — Progress Notes (Signed)
[facility] for Heparin Indication: DVT/ possible PE  Allergies  Allergen Reactions  . Biaxin [Clarithromycin] Diarrhea and Nausea And Vomiting  . Demerol [Meperidine] Nausea And Vomiting  . Hydrocodone Nausea And Vomiting  . Sulfa Antibiotics Hives  . Tramadol Diarrhea and Nausea And Vomiting  . Ultracet [Tramadol-Acetaminophen] Other (See Comments)    Patient Measurements: Height: 5' 2" (157.5 cm) Weight: 96 lb 14.4 oz (44 kg) IBW/kg (Calculated) : 50.1  Vital Signs: Temp: 97.8 F (36.6 C) (04/07 0423) Temp Source: Oral (04/07 0423) BP: 136/75 (04/07 0423) Pulse Rate: 93 (04/07 0423)  Labs: Recent Labs    09/13/17 1529 09/13/17 2043 09/14/17 0302 09/14/17 0626  HGB 12.8  --  10.9*  --   HCT 39.6  --  32.8*  --   PLT 337  --  266  --   APTT  --  27  --   --   LABPROT  --  13.5  --   --   INR  --  1.04  --   --   HEPARINUNFRC  --   --   --  0.23*  CREATININE 0.75  --  0.79  --   TROPONINI 0.22* 0.19* 0.14*  --     Estimated Creatinine Clearance: 32.5 mL/min (by C-G formula based on SCr of 0.79 mg/dL).   Medical History: Past Medical History:  Diagnosis Date  . Anxiety   . Arthritis   . Atrial fibrillation ([place])   . Coronary arteriosclerosis   . History of blood transfusion 2014  . Hyperlipidemia   . Hypertension   . Multiple myeloma ([place])   . Multiple myeloma in relapse ([place]) 10/29/2014  . Osteonecrosis of jaw ([place])    left   Assessment: 84 y/o F with a h/o multiple myeoloma and atrial fibrillation on ASA for anticoagulation admitted with DVT and possible PE.  Goal of Therapy:  Heparin level 0.3-0.7 units/ml Monitor platelets by anticoagulation protocol: Yes   Plan:  Give 2250 units bolus x 1 Start heparin infusion at 700 units/hr Check anti-Xa level in 8 hours and daily while on heparin Continue to monitor H&H and platelets   04/07 AM heparin level 0.23. 700 unit bolus and increase rate to 800 units/hr.  Recheck in 8 hours.  Alfie Rideaux S 09/14/2017,7:06 AM

## 2017-09-14 NOTE — NC FL2 (Signed)
[place] LEVEL OF CARE SCREENING TOOL     IDENTIFICATION  Patient Name: [name] Birthdate: 06-25-1924 Sex: female Admission Date (Current Location): 09/13/2017  [place] and [place] Number:  Engineering geologist and Address:  [facility], [address], [place], [place] [address]      Provider Number: [id number]  Attending Physician Name and Address:  Henreitta Leber, MD  Relative Name and Phone Number:  Kaslyn [place] (son) [phone]    Current Level of Care: Hospital Recommended Level of Care: Memory Care Prior Approval Number:    Date Approved/Denied:   PASRR Number:    Discharge Plan: Domiciliary (Rest home)([facility])    Current Diagnoses: Patient Active Problem List   Diagnosis Date Noted  . Chest pain 09/13/2017  . HCAP (healthcare-associated pneumonia) 08/07/2017  . Left leg weakness 08/04/2017  . Dehydration 02/19/2017  . Pain in right hip 12/28/2015  . Pain in lower back 12/28/2015  . Rheumatoid arthritis ([place]) 03/29/2015  . Inflammatory polyarthropathy ([place]) 03/29/2015  . Multiple myeloma not having achieved remission ([place]) 10/29/2014  . Absolute anemia 10/24/2014  . CAD in native artery 10/24/2014  . Benign essential HTN 10/24/2014  . MI (mitral incompetence) 05/25/2014  . AF (paroxysmal atrial fibrillation) ([place]) 05/25/2014  . Paroxysmal atrial fibrillation ([facility]) 05/25/2014    Orientation RESPIRATION BLADDER Height & Weight     Self, Place  Normal Continent Weight: 96 lb 14.4 oz (44 kg) Height:  _0  (157.5 cm)  BEHAVIORAL SYMPTOMS/MOOD NEUROLOGICAL BOWEL NUTRITION STATUS      Continent Diet(Dysphagia 3, thin liquids)  AMBULATORY STATUS COMMUNICATION OF NEEDS Skin   Supervision Verbally Normal                       Personal Care Assistance Level of Assistance  Bathing, Feeding, Dressing Bathing Assistance: Limited assistance Feeding assistance: Independent Dressing Assistance:  Limited assistance     Functional Limitations Info             SPECIAL CARE FACTORS FREQUENCY                       Contractures Contractures Info: Not present    Additional Factors Info  Code Status, Allergies Code Status Info: DNR Allergies Info: Biaxin Clarithromycin, Demerol Meperidine, Hydrocodone, Sulfa Antibiotics, Tramadol, Ultracet Tramadol-acetaminophen           Current Medications (09/14/2017):  This is the current hospital active medication list Current Facility-Administered Medications  Medication Dose Route Frequency Provider Last Rate Last Dose  . acyclovir (ZOVIRAX) 200 MG capsule 400 mg  400 mg Oral QHS Gouru, Aruna, MD      . aspirin EC tablet 81 mg  81 mg Oral Daily Gouru, Aruna, MD   81 mg at 09/14/17 0859  . bisacodyl (DULCOLAX) EC tablet 5 mg  5 mg Oral Daily PRN Gouru, Aruna, MD      . bisacodyl (DULCOLAX) suppository 10 mg  10 mg Rectal Daily Gouru, Aruna, MD   10 mg at 09/14/17 0859  . divalproex (DEPAKOTE SPRINKLE) capsule 125 mg  125 mg Oral Daily Gouru, Aruna, MD   125 mg at 09/14/17 0930  . docusate sodium (COLACE) capsule 100 mg  100 mg Oral BID Gouru, Aruna, MD   100 mg at 09/14/17 0859  . heparin ADULT infusion 100 units/mL (25000 units/268m sodium chloride 0.45%)  800 Units/hr Intravenous Continuous Gouru, Aruna, MD 8 mL/hr at 09/14/17 [phone]  800 Units/hr at 09/14/17 0755  . Melatonin TABS 5 mg  1 tablet Oral Daily Gouru, Aruna, MD      . mirtazapine (REMERON) tablet 30 mg  30 mg Oral QHS Gouru, Aruna, MD      . ondansetron (ZOFRAN) tablet 4 mg  4 mg Oral Q6H PRN Gouru, Aruna, MD       Or  . ondansetron (ZOFRAN) injection 4 mg  4 mg Intravenous Q6H PRN Gouru, Aruna, MD      . oxyCODONE (Oxy IR/ROXICODONE) immediate release tablet 2.5 mg  2.5 mg Oral Q6H PRN Gouru, Aruna, MD      . polyethylene glycol (MIRALAX / GLYCOLAX) packet 17 g  17 g Oral QHS PRN Gouru, Aruna, MD      . polyethylene glycol (MIRALAX / GLYCOLAX) packet 17 g  17 g Oral  Daily Gouru, Aruna, MD   17 g at 09/14/17 0859  . risperiDONE (RISPERDAL) tablet 1 mg  1 mg Oral QHS Gouru, Aruna, MD       Facility-Administered Medications Ordered in Other Encounters  Medication Dose Route Frequency Provider Last Rate Last Dose  . sodium chloride 0.9 % injection 10 mL  10 mL Intravenous PRN Forest Gleason, MD   10 mL at 03/01/15 1305     Discharge Medications: Please see discharge summary for a list of discharge medications.  Relevant Imaging Results:  Relevant Lab Results:   Additional Information [phone]  Zettie Pho, LCSW

## 2017-09-14 NOTE — Consult Note (Signed)
Hematology/Oncology Consult note [facility] [phone] [phone]  Patient Care Team: [facility] as PCP - General Forest Gleason, MD (Oncology) Christene Lye, MD (General Surgery)   Name of the patient: [name]  [id number]  September 19, 1924   Date of visit: 09/14/17 REASON FOR COSULTATION:  Multiple myeloma.   History of presenting illness-  This is a 84 year old female with a known history of multiple myeloma, dementia, chronic A. fib, coronary artery disease is brought in from [facility] memory care unit by her son for evaluation of left lower extremity swelling and pain.  Patient was found to have left lower extremity diffuse DVT from the cough veins extending into the popliteal and superficial femoral vein. She also complained chest pain.  CT angios chest PE showed positive forultiple bilateral central and segmental pulmonary emboli.  Patient was started on heparin drip. Regarding to patient's multiple myeloma, patient's was most recently on daratumumab which was given last in January 2019.  Due to pneumonia, daratumumab was currently on hold. Patient has had multiple myeloma labs done on September 01, 2017, M protein 1 g/dL, IgG1177 Patient was seen and examined at bedside.  She she is able to tell me her name, location.  She says she lives at home by herself and she was able to all the ADLs by herself.  She keeps saying"get that thing of my legs", and trying to get out of her bed.  Talk to nurse assistant, it appears that patient has been impulsive since admission.  Review of systems- Review of Systems  Constitutional: Negative for chills and fever.  HENT: Negative for ear discharge and hearing loss.   Eyes: Negative for pain.  Respiratory: Negative for cough.   Cardiovascular: Negative for chest pain.  Gastrointestinal: Negative for nausea.  Skin: Negative for rash.  A full review of system not able to obtain due  to patient's mental status.  Allergies  Allergen Reactions  . Biaxin [Clarithromycin] Diarrhea and Nausea And Vomiting  . Demerol [Meperidine] Nausea And Vomiting  . Hydrocodone Nausea And Vomiting  . Sulfa Antibiotics Hives  . Tramadol Diarrhea and Nausea And Vomiting  . Ultracet [Tramadol-Acetaminophen] Other (See Comments)    Patient Active Problem List   Diagnosis Date Noted  . Chest pain 09/13/2017  . HCAP (healthcare-associated pneumonia) 08/07/2017  . Left leg weakness 08/04/2017  . Dehydration 02/19/2017  . Pain in right hip 12/28/2015  . Pain in lower back 12/28/2015  . Rheumatoid arthritis ([place]) 03/29/2015  . Inflammatory polyarthropathy ([place]) 03/29/2015  . Multiple myeloma not having achieved remission ([place]) 10/29/2014  . Absolute anemia 10/24/2014  . CAD in native artery 10/24/2014  . Benign essential HTN 10/24/2014  . MI (mitral incompetence) 05/25/2014  . AF (paroxysmal atrial fibrillation) ([place]) 05/25/2014  . Paroxysmal atrial fibrillation ([place]) 05/25/2014     Past Medical History:  Diagnosis Date  . Anxiety   . Arthritis   . Atrial fibrillation ([place])   . Coronary arteriosclerosis   . History of blood transfusion 2014  . Hyperlipidemia   . Hypertension   . Multiple myeloma ([place])   . Multiple myeloma in relapse ([place]) 10/29/2014  . Osteonecrosis of jaw ([place])    left     Past Surgical History:  Procedure Laterality Date  . ABDOMINAL HYSTERECTOMY    . APPENDECTOMY    . PARTIAL COLECTOMY    . TOTAL KNEE REVISION Left     Social History   Socioeconomic History  .  Marital status: Widowed    Spouse name: Not on file  . Number of children: Not on file  . Years of education: Not on file  . Highest education level: Not on file  Occupational History  . Not on file  Social Needs  . Financial resource strain: Not very hard  . Food insecurity:    Worry: Patient refused    Inability: Patient refused  . Transportation needs:    Medical: Patient  refused    Non-medical: Patient refused  Tobacco Use  . Smoking status: Never Smoker  . Smokeless tobacco: Never Used  Substance and Sexual Activity  . Alcohol use: Yes    Comment: occasional  . Drug use: No  . Sexual activity: Never  Lifestyle  . Physical activity:    Days per week: Patient refused    Minutes per session: Patient refused  . Stress: Only a little  Relationships  . Social connections:    Talks on phone: Patient refused    Gets together: Patient refused    Attends religious service: Patient refused    Active member of club or organization: Patient refused    Attends meetings of clubs or organizations: Patient refused    Relationship status: Patient refused  . Intimate partner violence:    Fear of current or ex partner: Patient refused    Emotionally abused: Patient refused    Physically abused: Patient refused    Forced sexual activity: Patient refused  Other Topics Concern  . Not on file  Social History Narrative  . Not on file     Family History  Problem Relation Age of Onset  . Cancer Father   . Heart attack Mother      Current Facility-Administered Medications:  .  acyclovir (ZOVIRAX) 200 MG capsule 400 mg, 400 mg, Oral, QHS, Gouru, Aruna, MD .  aspirin EC tablet 81 mg, 81 mg, Oral, Daily, Gouru, Aruna, MD, 81 mg at 09/14/17 0859 .  bisacodyl (DULCOLAX) EC tablet 5 mg, 5 mg, Oral, Daily PRN, Gouru, Aruna, MD .  bisacodyl (DULCOLAX) suppository 10 mg, 10 mg, Rectal, Daily, Gouru, Aruna, MD, 10 mg at 09/14/17 0859 .  divalproex (DEPAKOTE SPRINKLE) capsule 125 mg, 125 mg, Oral, Daily, Gouru, Aruna, MD, 125 mg at 09/14/17 0930 .  docusate sodium (COLACE) capsule 100 mg, 100 mg, Oral, BID, Gouru, Aruna, MD, 100 mg at 09/14/17 0859 .  heparin ADULT infusion 100 units/mL (25000 units/22m sodium chloride 0.45%), 900 Units/hr, Intravenous, Continuous, Sainani, VBelia Heman MD, Last Rate: 9 mL/hr at 09/14/17 1544, 900 Units/hr at 09/14/17 1544 .  Melatonin TABS  5 mg, 1 tablet, Oral, Daily, Gouru, Aruna, MD .  mirtazapine (REMERON) tablet 30 mg, 30 mg, Oral, QHS, Gouru, Aruna, MD .  ondansetron (ZOFRAN) tablet 4 mg, 4 mg, Oral, Q6H PRN **OR** ondansetron (ZOFRAN) injection 4 mg, 4 mg, Intravenous, Q6H PRN, Gouru, Aruna, MD .  oxyCODONE (Oxy IR/ROXICODONE) immediate release tablet 2.5 mg, 2.5 mg, Oral, Q6H PRN, Gouru, Aruna, MD .  polyethylene glycol (MIRALAX / GLYCOLAX) packet 17 g, 17 g, Oral, QHS PRN, Gouru, Aruna, MD .  polyethylene glycol (MIRALAX / GLYCOLAX) packet 17 g, 17 g, Oral, Daily, Gouru, Aruna, MD, 17 g at 09/14/17 0859 .  risperiDONE (RISPERDAL) tablet 1 mg, 1 mg, Oral, QHS, Gouru, Aruna, MD  Facility-Administered Medications Ordered in Other Encounters:  .  sodium chloride 0.9 % injection 10 mL, 10 mL, Intravenous, PRN, Choksi, Janak, MD, 10 mL at 03/01/15 1305   Physical  exam:  Vitals:   09/13/17 2156 09/13/17 2304 09/14/17 0423 09/14/17 0858  BP: 118/75 99/66 136/75 114/63  Pulse: 95 84 93 93  Resp:   16 16  Temp:  98 F (36.7 C) 97.8 F (36.6 C) 98 F (36.7 C)  TempSrc:  Oral Oral Oral  SpO2: 96% 95% 96% 97%  Weight:  96 lb 14.4 oz (44 kg)    Height:  '5\' 2"'  (1.575 m)     GENERAL:Alert, no distress and comfortable.  EYES: no pallor or icterus OROPHARYNX: no thrush or ulceration; g NECK: supple, no masses felt LYMPH:  no palpable lymphadenopathy in the cervical, axillary or inguinal regions LUNGS: clear to auscultation and  No wheeze or crackles HEART/CVS: regular rate & rhythm and no murmurs; No lower extremity edema ABDOMEN: abdomen soft, non-tender and normal bowel sounds Musculoskeletal:no cyanosis of digits and no clubbing  PSYCH: alert & oriented x 3  NEURO: no focal motor/sensory deficits SKIN: Left lower extremity bruises.  Right foot abrasion marks.     CMP Latest Ref Rng & Units 09/14/2017  Glucose 65 - 99 mg/dL 105(H)  BUN 6 - 20 mg/dL 33(H)  Creatinine 0.44 - 1.00 mg/dL 0.79  Sodium 135 - 145 mmol/L  137  Potassium 3.5 - 5.1 mmol/L 3.7  Chloride 101 - 111 mmol/L 109  CO2 22 - 32 mmol/L 22  Calcium 8.9 - 10.3 mg/dL 7.6(L)  Total Protein 6.5 - 8.1 g/dL -  Total Bilirubin 0.3 - 1.2 mg/dL -  Alkaline Phos 38 - 126 U/L -  AST 15 - 41 U/L -  ALT 14 - 54 U/L -   CBC Latest Ref Rng & Units 09/14/2017  WBC 3.6 - 11.0 K/uL 6.6  Hemoglobin 12.0 - 16.0 g/dL 10.9(L)  Hematocrit 35.0 - 47.0 % 32.8(L)  Platelets 150 - 440 K/uL 266    Dg Chest 2 View  Result Date: 09/13/2017 CLINICAL DATA:  Weakness and known DVT EXAM: CHEST - 2 VIEW COMPARISON:  09/01/2017 FINDINGS: Cardiac shadow is stable. Aortic calcifications are noted. Right chest wall port is seen in satisfactory position. The lungs are clear. Calcified granuloma in the left base is again noted. No acute bony abnormality is seen. Chronic compression deformity at the thoracolumbar junction is noted. IMPRESSION: Chronic changes without acute abnormality. Electronically Signed   By: Inez Catalina M.D.   On: 09/13/2017 20:30   Dg Chest 2 View  Result Date: 09/01/2017 CLINICAL DATA:  2-3 days of cough. Recent episode of pneumonia. History of atrial fibrillation, coronary artery disease, multiple myeloma not in remission. EXAM: CHEST - 2 VIEW COMPARISON:  Chest x-ray of August 07, 2017 FINDINGS: The lungs are well-expanded. There is no focal infiltrate. There is no pleural effusion. The heart and pulmonary vascularity are normal. There is tortuosity of the descending thoracic aorta with calcification in the aortic arch. There is a stable calcified nodule in the lingula. The power port catheter tip projects over the distal third of the SVC. There is gentle levocurvature centered in the mid to lower thoracic spine. There is high-grade compression of approximately L1 which is chronic. IMPRESSION: COPD.  No pneumonia nor CHF.  Previous granulomatous infection. Electronically Signed   By: David  Martinique M.D.   On: 09/01/2017 13:40   Dg Abdomen 1  View  Result Date: 09/13/2017 CLINICAL DATA:  Abdominal pain EXAM: ABDOMEN - 1 VIEW COMPARISON:  12/13/2016 FINDINGS: Scattered large and small bowel gas is noted. Mild retained fecal material is noted without  obstructive change. Chronic compression deformity of L1 is noted. Degenerative changes of lumbar spine are seen. No free air is noted. IMPRESSION: Chronic changes without acute abnormality. Electronically Signed   By: Inez Catalina M.D.   On: 09/13/2017 19:20   Ct Angio Chest Pe W And/or Wo Contrast  Result Date: 09/13/2017 CLINICAL DATA:  Shortness of breath. Left leg swelling and pain. Abdominal pain and constipation. Acute DVT was shown on left lower extremity venous Doppler. History of multiple myeloma. EXAM: CT ANGIOGRAPHY CHEST WITH CONTRAST TECHNIQUE: Multidetector CT imaging of the chest was performed using the standard protocol during bolus administration of intravenous contrast. Multiplanar CT image reconstructions and MIPs were obtained to evaluate the vascular anatomy. CONTRAST:  21m OMNIPAQUE IOHEXOL 350 MG/ML SOLN COMPARISON:  None. FINDINGS: Cardiovascular: Multiple acute pulmonary emboli are demonstrated. Multiple filling defects demonstrated in the distal main pulmonary arteries and extending into bilateral upper, middle, and lower lobe branches. Right ventricle is dilated with reflux of contrast material into the hepatic veins. RV to LV ratio measures 2.6. This suggests a high likelihood of right heart strain. No pericardial effusion. Normal caliber thoracic aorta with scattered calcifications. Mediastinum/Nodes: No enlarged mediastinal, hilar, or axillary lymph nodes. Thyroid gland, trachea, and esophagus demonstrate no significant findings.l Lungs/Pleura: Emphysematous changes in the lungs. Atelectasis in the lung bases. No focal consolidation. No pleural effusions. No pneumothorax. Calcified granulomas in the left apex. Airways are patent. Upper Abdomen: Calcified granulomas in the  spleen. Musculoskeletal: Degenerative changes in the spine. No destructive bone lesions. Review of the MIP images confirms the above findings. IMPRESSION: 1. Positive examination for multiple bilateral central and segmental pulmonary emboli. Positive for acute PE with CT evidence of right heart strain (RV/LV Ratio = 2.6) consistent with at least submassive (intermediate risk) PE. The presence of right heart strain has been associated with an increased risk of morbidity and mortality. Please activate Code PE by paging [phone] 2. Emphysematous changes in the lungs.  No focal consolidation. These results were called by telephone at the time of interpretation on 09/13/2017 at 10:04 pm to Dr. WJannifer Franklin who verbally acknowledged these results. Aortic Atherosclerosis (ICD10-I70.0) and Emphysema (ICD10-J43.9). Electronically Signed   By: WLucienne CapersM.D.   On: 09/13/2017 22:10   [place]Venous Img Lower Unilateral Left  Result Date: 09/13/2017 CLINICAL DATA:  Left lower extremity pain and swelling for 2 days EXAM: LEFT LOWER EXTREMITY VENOUS DOPPLER ULTRASOUND TECHNIQUE: Gray-scale sonography with graded compression, as well as color Doppler and duplex ultrasound were performed to evaluate the lower extremity deep venous systems from the level of the common femoral vein and including the common femoral, femoral, profunda femoral, popliteal and calf veins including the posterior tibial, peroneal and gastrocnemius veins when visible. The superficial great saphenous vein was also interrogated. Spectral Doppler was utilized to evaluate flow at rest and with distal augmentation maneuvers in the common femoral, femoral and popliteal veins. COMPARISON:  None. FINDINGS: Contralateral Common Femoral Vein: Respiratory phasicity is normal and symmetric with the symptomatic side. No evidence of thrombus. Normal compressibility. Common Femoral Vein: No evidence of thrombus. Normal compressibility, respiratory phasicity and response  to augmentation. Saphenofemoral Junction: No evidence of thrombus. Normal compressibility and flow on color Doppler imaging. Profunda Femoral Vein: No evidence of thrombus. Normal compressibility and flow on color Doppler imaging. Femoral Vein: Thrombus is noted with decreased compressibility Popliteal Vein: Thrombus is noted with decreased compressibility. Calf Veins: Thrombus is noted with decreased compressibility. Superficial Great Saphenous Vein: No evidence of  thrombus. Normal compressibility. Venous Reflux:  None. Other Findings:  None. IMPRESSION: Diffuse deep venous thrombosis from the calf veins extending into the popliteal and superficial femoral vein. Electronically Signed   By: Inez Catalina M.D.   On: 09/13/2017 20:19    Assessment and plan- Patient is a 84 y.o. female with medical history of possible myeloma, hypertension, hyperlipidemia, A. fib, dementia currently admitted due to left lower extremity swelling.   #Acute DVT/PE, agree with continue on heparin drip, appears tolerating anticoagulation well so far..  Switch to Eliquis at discharge.  Recommend PT evaluation.  Patient has high fall risk given her dementia  Her lower extremity shows bruises and abrasion marks that may be secondary to previous fall episodes.   #Multiple myeloma: Outpatient follow-up with Dr. Rogue Bussing, her most recent MM work up showed relatively stable M spike and light chain ratio.Marland Kitchen  She has got contrast study for chest PE protocol.  Recommend close monitor kidney function.  At high risk of ATN.  Encourage patient to increase oral hydration.   Thank you for allowing me to participate in the care of this patient.   Earlie Server, MD, PhD Hematology Oncology [facility] at [facility] Pager- [phone] 09/14/2017

## 2017-09-14 NOTE — Progress Notes (Signed)
[facility] NAME: [name]    MR#:  [id number]  DATE OF BIRTH:  1924-11-28  SUBJECTIVE:   Patient presented to the hospital due to weakness, left lower extremity edema and also vague chest pain.  Patient was noted to have a left lower extremity DVT and also CT chest positive for pulmonary embolism.  Patient has underlying dementia and therefore poor historian.  Patient is on a heparin drip and tolerating it well.  REVIEW OF SYSTEMS:    Review of Systems  Unable to perform ROS: Mental acuity    Nutrition: Dysphagia III Tolerating Diet: Yes Tolerating PT: Await Eval.   DRUG ALLERGIES:   Allergies  Allergen Reactions  . Biaxin [Clarithromycin] Diarrhea and Nausea And Vomiting  . Demerol [Meperidine] Nausea And Vomiting  . Hydrocodone Nausea And Vomiting  . Sulfa Antibiotics Hives  . Tramadol Diarrhea and Nausea And Vomiting  . Ultracet [Tramadol-Acetaminophen] Other (See Comments)    VITALS:  Blood pressure 114/63, pulse 93, temperature 98 F (36.7 C), temperature source Oral, resp. rate 16, height '5\' 2"'$  (1.575 m), weight 44 kg (96 lb 14.4 oz), SpO2 97 %.  PHYSICAL EXAMINATION:   Physical Exam  GENERAL:  84 y.o.-year-old patient lying in bed in no acute distress.  EYES: Pupils equal, round, reactive to light and accommodation. No scleral icterus. Extraocular muscles intact.  HEENT: Head atraumatic, normocephalic. Oropharynx and nasopharynx clear.  NECK:  Supple, no jugular venous distention. No thyroid enlargement, no tenderness.  LUNGS: Normal breath sounds bilaterally, no wheezing, rales, rhonchi. No use of accessory muscles of respiration.  CARDIOVASCULAR: S1, S2 normal. No murmurs, rubs, or gallops.  ABDOMEN: Soft, nontender, nondistended. Bowel sounds present. No organomegaly or mass.  EXTREMITIES: No cyanosis, clubbing or edema b/l.    NEUROLOGIC: Cranial nerves II through XII are intact. No focal Motor or  sensory deficits b/l.  Globally weak.  PSYCHIATRIC: The patient is alert and oriented x 1.  SKIN: No obvious rash, lesion, or ulcer.    LABORATORY PANEL:   CBC Recent Labs  Lab 09/14/17 0302  WBC 6.6  HGB 10.9*  HCT 32.8*  PLT 266   ------------------------------------------------------------------------------------------------------------------  Chemistries  Recent Labs  Lab 09/14/17 0302  NA 137  K 3.7  CL 109  CO2 22  GLUCOSE 105*  BUN 33*  CREATININE 0.79  CALCIUM 7.6*   ------------------------------------------------------------------------------------------------------------------  Cardiac Enzymes Recent Labs  Lab 09/14/17 0849  TROPONINI 0.09*   ------------------------------------------------------------------------------------------------------------------  RADIOLOGY:  Dg Chest 2 View  Result Date: 09/13/2017 CLINICAL DATA:  Weakness and known DVT EXAM: CHEST - 2 VIEW COMPARISON:  09/01/2017 FINDINGS: Cardiac shadow is stable. Aortic calcifications are noted. Right chest wall port is seen in satisfactory position. The lungs are clear. Calcified granuloma in the left base is again noted. No acute bony abnormality is seen. Chronic compression deformity at the thoracolumbar junction is noted. IMPRESSION: Chronic changes without acute abnormality. Electronically Signed   By: Inez Catalina M.D.   On: 09/13/2017 20:30   Dg Abdomen 1 View  Result Date: 09/13/2017 CLINICAL DATA:  Abdominal pain EXAM: ABDOMEN - 1 VIEW COMPARISON:  12/13/2016 FINDINGS: Scattered large and small bowel gas is noted. Mild retained fecal material is noted without obstructive change. Chronic compression deformity of L1 is noted. Degenerative changes of lumbar spine are seen. No free air is noted. IMPRESSION: Chronic changes without acute abnormality. Electronically Signed   By: Inez Catalina M.D.   On: 09/13/2017  19:20   Ct Angio Chest Pe W And/or Wo Contrast  Result Date:  09/13/2017 CLINICAL DATA:  Shortness of breath. Left leg swelling and pain. Abdominal pain and constipation. Acute DVT was shown on left lower extremity venous Doppler. History of multiple myeloma. EXAM: CT ANGIOGRAPHY CHEST WITH CONTRAST TECHNIQUE: Multidetector CT imaging of the chest was performed using the standard protocol during bolus administration of intravenous contrast. Multiplanar CT image reconstructions and MIPs were obtained to evaluate the vascular anatomy. CONTRAST:  79m OMNIPAQUE IOHEXOL 350 MG/ML SOLN COMPARISON:  None. FINDINGS: Cardiovascular: Multiple acute pulmonary emboli are demonstrated. Multiple filling defects demonstrated in the distal main pulmonary arteries and extending into bilateral upper, middle, and lower lobe branches. Right ventricle is dilated with reflux of contrast material into the hepatic veins. RV to LV ratio measures 2.6. This suggests a high likelihood of right heart strain. No pericardial effusion. Normal caliber thoracic aorta with scattered calcifications. Mediastinum/Nodes: No enlarged mediastinal, hilar, or axillary lymph nodes. Thyroid gland, trachea, and esophagus demonstrate no significant findings.l Lungs/Pleura: Emphysematous changes in the lungs. Atelectasis in the lung bases. No focal consolidation. No pleural effusions. No pneumothorax. Calcified granulomas in the left apex. Airways are patent. Upper Abdomen: Calcified granulomas in the spleen. Musculoskeletal: Degenerative changes in the spine. No destructive bone lesions. Review of the MIP images confirms the above findings. IMPRESSION: 1. Positive examination for multiple bilateral central and segmental pulmonary emboli. Positive for acute PE with CT evidence of right heart strain (RV/LV Ratio = 2.6) consistent with at least submassive (intermediate risk) PE. The presence of right heart strain has been associated with an increased risk of morbidity and mortality. Please activate Code PE by paging  [phone] 2. Emphysematous changes in the lungs.  No focal consolidation. These results were called by telephone at the time of interpretation on 09/13/2017 at 10:04 pm to Dr. WJannifer Franklin who verbally acknowledged these results. Aortic Atherosclerosis (ICD10-I70.0) and Emphysema (ICD10-J43.9). Electronically Signed   By: WLucienne CapersM.D.   On: 09/13/2017 22:10   UKoreaVenous Img Lower Unilateral Left  Result Date: 09/13/2017 CLINICAL DATA:  Left lower extremity pain and swelling for 2 days EXAM: LEFT LOWER EXTREMITY VENOUS DOPPLER ULTRASOUND TECHNIQUE: Gray-scale sonography with graded compression, as well as color Doppler and duplex ultrasound were performed to evaluate the lower extremity deep venous systems from the level of the common femoral vein and including the common femoral, femoral, profunda femoral, popliteal and calf veins including the posterior tibial, peroneal and gastrocnemius veins when visible. The superficial great saphenous vein was also interrogated. Spectral Doppler was utilized to evaluate flow at rest and with distal augmentation maneuvers in the common femoral, femoral and popliteal veins. COMPARISON:  None. FINDINGS: Contralateral Common Femoral Vein: Respiratory phasicity is normal and symmetric with the symptomatic side. No evidence of thrombus. Normal compressibility. Common Femoral Vein: No evidence of thrombus. Normal compressibility, respiratory phasicity and response to augmentation. Saphenofemoral Junction: No evidence of thrombus. Normal compressibility and flow on color Doppler imaging. Profunda Femoral Vein: No evidence of thrombus. Normal compressibility and flow on color Doppler imaging. Femoral Vein: Thrombus is noted with decreased compressibility Popliteal Vein: Thrombus is noted with decreased compressibility. Calf Veins: Thrombus is noted with decreased compressibility. Superficial Great Saphenous Vein: No evidence of thrombus. Normal compressibility. Venous Reflux:   None. Other Findings:  None. IMPRESSION: Diffuse deep venous thrombosis from the calf veins extending into the popliteal and superficial femoral vein. Electronically Signed   By: MLinus MakoD.  On: 09/13/2017 20:19     ASSESSMENT AND PLAN:   84 year old female with past medical history of multiple myeloma, hypertension, hyperlipidemia, atrial fibrillation, osteonecrosis of the jaw who presents to the hospital due to left lower extremity swelling, chest pain.  1.  PE/DVT-this is the cause of patient's left lower extremity swelling and patient's chest pain.  Patient's Dopplers were positive for DVT and patient CT chest is also positive for pulmonary embolism. -Continue heparin drip and will switch to oral anticoagulants tomorrow.  Patient is not hypoxic and tolerating anticoagulation well so far.  2.  History of multiple myeloma- patient is followed by oncology by Dr. Rogue Bussing.  -We will await further input from oncology regarding the new diagnosis of PE/DVT.  3.  History of dementia with behavioral disturbance-continue Risperdal, Remeron, Depakote.  Discussed plan of care with patient's son over the phone.  Patient apparently is being transferred from a assisted living facility in [place] [address] in the local area.  Patient's son has already made the arrangements.  For possible discharge on Tuesday.   All the records are reviewed and case discussed with Care Management/Social Worker. Management plans discussed with the patient, family and they are in agreement.  CODE STATUS: DNR  DVT Prophylaxis: Hep drip  TOTAL TIME TAKING CARE OF THIS PATIENT: 30 minutes.   POSSIBLE D/C IN 1-2 DAYS, DEPENDING ON CLINICAL CONDITION.   Henreitta Leber M.D on 09/14/2017 at 3:23 PM  Between 7am to 6pm - Pager - [phone]  After 6pm go to [email or web address] - [employer] [facility]  Office  [phone]  CC: Primary care physician; [facility]

## 2017-09-14 NOTE — Clinical Social Work Note (Signed)
Clinical Social Work Assessment  Patient Details  Name: [name] MRN: [id number] Date of Birth: [birth year]  Date of referral:  09/14/17               Reason for consult:  Facility Placement                Permission sought to share information with:  Facility Art therapist granted to share information::  Yes, Verbal Permission Granted  Name::        Agency::  [facility]  Relationship::     Contact Information:     Housing/Transportation Living arrangements for the past 2 months:  [place] of Information:  Medical Team, Adult Children Patient Interpreter Needed:  None Criminal Activity/Legal Involvement Pertinent to Current Situation/Hospitalization:  No - Comment as needed Significant Relationships:  Adult Children, Community Support Lives with:  Facility Resident Do you feel safe going back to the place where you live?  Yes Need for family participation in patient care:  Yes (Comment)  Care giving concerns:  [facility]   Social Worker assessment / plan:  The CSW spoke with the patient's son about the discharge plan. The patient is currently a resident of [facility] in [place], and the facility is in the process of transferring the patient to the [place] location on Tuesday. The family wants to continue this plan, and the attending MD is in agreement with transfer from [facility] to [place] on Tuesday. The CSW will contact the facility during regular business hours to assist with facilitation and follow for discharge.  Employment status:  Retired Forensic scientist:  Medicare PT Recommendations:  Not assessed at this time Information / Referral to community resources:     Patient/Family's Response to care:  The family thanked the CSW.  Patient/Family's Understanding of and Emotional Response to Diagnosis, Current Treatment, and Prognosis:  The family understands the patient's  health concerns and are in agreement with the discharge plan.  Emotional Assessment Appearance:  Appears stated age Attitude/Demeanor/Rapport:  Lethargic Affect (typically observed):  Stable Orientation:  Oriented to Self, Oriented to Place Alcohol / Substance use:  Never Used Psych involvement (Current and /or in the community):  No (Comment)  Discharge Needs  Concerns to be addressed:  Care Coordination, Discharge Planning Concerns Readmission within the last 30 days:  Yes Current discharge risk:  Chronically ill Barriers to Discharge:  Continued Medical Work up   [employer], LCSW 09/14/2017, 1:35 PM

## 2017-09-14 NOTE — Progress Notes (Signed)
[facility] for Heparin Indication: DVT/ possible PE  Allergies  Allergen Reactions  . Biaxin [Clarithromycin] Diarrhea and Nausea And Vomiting  . Demerol [Meperidine] Nausea And Vomiting  . Hydrocodone Nausea And Vomiting  . Sulfa Antibiotics Hives  . Tramadol Diarrhea and Nausea And Vomiting  . Ultracet [Tramadol-Acetaminophen] Other (See Comments)    Patient Measurements: Height: _0  (157.5 cm) Weight: 96 lb 14.4 oz (44 kg) IBW/kg (Calculated) : 50.1  Vital Signs: Temp: 98 F (36.7 C) (04/07 0858) Temp Source: Oral (04/07 0858) BP: 114/63 (04/07 0858) Pulse Rate: 93 (04/07 0858)  Labs: Recent Labs    09/13/17 1529 09/13/17 2043 09/14/17 0302 09/14/17 0626 09/14/17 0849 09/14/17 1452  HGB 12.8  --  10.9*  --   --   --   HCT 39.6  --  32.8*  --   --   --   PLT 337  --  266  --   --   --   APTT  --  27  --   --   --   --   LABPROT  --  13.5  --   --   --   --   INR  --  1.04  --   --   --   --   HEPARINUNFRC  --   --   --  0.23*  --  0.29*  CREATININE 0.75  --  0.79  --   --   --   TROPONINI 0.22* 0.19* 0.14*  --  0.09*  --     Estimated Creatinine Clearance: 32.5 mL/min (by C-G formula based on SCr of 0.79 mg/dL).   Medical History: Past Medical History:  Diagnosis Date  . Anxiety   . Arthritis   . Atrial fibrillation ([place])   . Coronary arteriosclerosis   . History of blood transfusion 2014  . Hyperlipidemia   . Hypertension   . Multiple myeloma ([place])   . Multiple myeloma in relapse ([place]) 10/29/2014  . Osteonecrosis of jaw ([place])    left   Assessment: 84 y/o F with a h/o multiple myeoloma and atrial fibrillation on ASA for anticoagulation admitted with DVT and possible PE.  Goal of Therapy:  Heparin level 0.3-0.7 units/ml Monitor platelets by anticoagulation protocol: Yes   Plan:  Give 2250 units bolus x 1 Start heparin infusion at 700 units/hr Check anti-Xa level in 8 hours and daily while on  heparin Continue to monitor H&H and platelets   04/07 AM heparin level 0.23. 700 unit bolus and increase rate to 800 units/hr. Recheck in 8 hours.  4/7: HL@ 1452= 0.29. Will give 700 units bolus and increase drip to 900 units/hr. Recheck HL in 8 hrs.  Leighton Brickley A 09/14/2017,3:23 PM

## 2017-09-15 LAB — CBC
HCT: 32 % — ABNORMAL LOW (ref 35.0–47.0)
HEMOGLOBIN: 11 g/dL — AB (ref 12.0–16.0)
MCH: 30.4 pg (ref 26.0–34.0)
MCHC: 34.4 g/dL (ref 32.0–36.0)
MCV: 88.2 fL (ref 80.0–100.0)
Platelets: 234 10*3/uL (ref 150–440)
RBC: 3.63 MIL/uL — ABNORMAL LOW (ref 3.80–5.20)
RDW: 15.3 % — ABNORMAL HIGH (ref 11.5–14.5)
WBC: 5.6 10*3/uL (ref 3.6–11.0)

## 2017-09-15 LAB — HEPARIN LEVEL (UNFRACTIONATED)
HEPARIN UNFRACTIONATED: 0.46 [IU]/mL (ref 0.30–0.70)
Heparin Unfractionated: 0.39 IU/mL (ref 0.30–0.70)

## 2017-09-15 MED ORDER — ENSURE ENLIVE PO LIQD
237.0000 mL | Freq: Three times a day (TID) | ORAL | Status: DC
  Administered 2017-09-15 – 2017-09-16 (×3): 237 mL via ORAL

## 2017-09-15 MED ORDER — MEGESTROL ACETATE 20 MG PO TABS
80.0000 mg | ORAL_TABLET | Freq: Two times a day (BID) | ORAL | Status: DC
  Administered 2017-09-15 – 2017-09-16 (×2): 80 mg via ORAL
  Filled 2017-09-15 (×6): qty 2

## 2017-09-15 MED ORDER — MELATONIN 5 MG PO TABS
1.0000 | ORAL_TABLET | Freq: Every day | ORAL | Status: DC
  Administered 2017-09-15: 5 mg via ORAL
  Filled 2017-09-15: qty 1

## 2017-09-15 MED ORDER — APIXABAN 5 MG PO TABS: 5.0000 mg | ORAL_TABLET | Freq: Two times a day (BID) | ORAL | Status: DC

## 2017-09-15 MED ORDER — FENTANYL 25 MCG/HR TD PT72
25.0000 ug | MEDICATED_PATCH | TRANSDERMAL | Status: DC
  Administered 2017-09-15: 25 ug via TRANSDERMAL
  Filled 2017-09-15: qty 1

## 2017-09-15 MED ORDER — SODIUM CHLORIDE 0.9% FLUSH: 10.0000 mL | INTRAVENOUS | Status: DC | PRN

## 2017-09-15 MED ORDER — APIXABAN 5 MG PO TABS
10.0000 mg | ORAL_TABLET | Freq: Two times a day (BID) | ORAL | Status: DC
  Administered 2017-09-15 – 2017-09-16 (×3): 10 mg via ORAL
  Filled 2017-09-15: qty 4
  Filled 2017-09-15 (×2): qty 2

## 2017-09-15 MED ORDER — ADULT MULTIVITAMIN W/MINERALS CH
1.0000 | ORAL_TABLET | Freq: Every day | ORAL | Status: DC
  Administered 2017-09-15 – 2017-09-16 (×2): 1 via ORAL
  Filled 2017-09-15 (×2): qty 1

## 2017-09-15 NOTE — Progress Notes (Signed)
Son Merry Proud Fudala notifed of patient's fall. Notified that patient is now on low bed with floor mats on both sides. Bed alarm on sensitive option. Patient's VSS WNL, no acute abnormalities noticed. Patient now resting more comfortably in the bed after readjustment. Son gave some helpful hints on how to calm his mother down, appreciated the call. MD aware with no new orders. Will continue to monitor patient.

## 2017-09-15 NOTE — Progress Notes (Signed)
[name] NAME: [name]    MR#:  [id number]  DATE OF BIRTH:  [birth year]  SUBJECTIVE:   No acute events overnight.  Patient is not hypoxic.  Remains on heparin drip and will switch to Eliquis today.  Plan for discharge to assisted living with home health services tomorrow.  REVIEW OF SYSTEMS:    Review of Systems  Unable to perform ROS: Mental acuity    Nutrition: Dysphagia III Tolerating Diet: Yes Tolerating PT: Await Eval.   DRUG ALLERGIES:   Allergies  Allergen Reactions  . Biaxin [Clarithromycin] Diarrhea and Nausea And Vomiting  . Demerol [Meperidine] Nausea And Vomiting  . Hydrocodone Nausea And Vomiting  . Sulfa Antibiotics Hives  . Tramadol Diarrhea and Nausea And Vomiting  . Ultracet [Tramadol-Acetaminophen] Other (See Comments)    VITALS:  Blood pressure 132/67, pulse 86, temperature 98.3 F (36.8 C), temperature source Oral, resp. rate 18, height _0  (1.575 m), weight 43.1 kg (95 lb 0.3 oz), SpO2 97 %.  PHYSICAL EXAMINATION:   Physical Exam  GENERAL:  84 y.o.-year-old patient lying in bed in no acute distress.  EYES: Pupils equal, round, reactive to light and accommodation. No scleral icterus. Extraocular muscles intact.  HEENT: Head atraumatic, normocephalic. Oropharynx and nasopharynx clear.  NECK:  Supple, no jugular venous distention. No thyroid enlargement, no tenderness.  LUNGS: Normal breath sounds bilaterally, no wheezing, rales, rhonchi. No use of accessory muscles of respiration.  CARDIOVASCULAR: S1, S2 normal. No murmurs, rubs, or gallops.  ABDOMEN: Soft, nontender, nondistended. Bowel sounds present. No organomegaly or mass.  EXTREMITIES: No cyanosis, clubbing or edema b/l.    NEUROLOGIC: Cranial nerves II through XII are intact. No focal Motor or sensory deficits b/l.  Globally weak.  PSYCHIATRIC: The patient is alert and oriented x 1.  SKIN: No obvious rash, lesion, or ulcer.     LABORATORY PANEL:   CBC Recent Labs  Lab 09/15/17 0030  WBC 5.6  HGB 11.0*  HCT 32.0*  PLT 234   ------------------------------------------------------------------------------------------------------------------  Chemistries  Recent Labs  Lab 09/14/17 0302  NA 137  K 3.7  CL 109  CO2 22  GLUCOSE 105*  BUN 33*  CREATININE 0.79  CALCIUM 7.6*   ------------------------------------------------------------------------------------------------------------------  Cardiac Enzymes Recent Labs  Lab 09/14/17 0849  TROPONINI 0.09*   ------------------------------------------------------------------------------------------------------------------  RADIOLOGY:  Dg Chest 2 View  Result Date: 09/13/2017 CLINICAL DATA:  Weakness and known DVT EXAM: CHEST - 2 VIEW COMPARISON:  09/01/2017 FINDINGS: Cardiac shadow is stable. Aortic calcifications are noted. Right chest wall port is seen in satisfactory position. The lungs are clear. Calcified granuloma in the left base is again noted. No acute bony abnormality is seen. Chronic compression deformity at the thoracolumbar junction is noted. IMPRESSION: Chronic changes without acute abnormality. Electronically Signed   By: Inez Catalina M.D.   On: 09/13/2017 20:30   Dg Abdomen 1 View  Result Date: 09/13/2017 CLINICAL DATA:  Abdominal pain EXAM: ABDOMEN - 1 VIEW COMPARISON:  12/13/2016 FINDINGS: Scattered large and small bowel gas is noted. Mild retained fecal material is noted without obstructive change. Chronic compression deformity of L1 is noted. Degenerative changes of lumbar spine are seen. No free air is noted. IMPRESSION: Chronic changes without acute abnormality. Electronically Signed   By: Inez Catalina M.D.   On: 09/13/2017 19:20   Ct Angio Chest Pe W And/or Wo Contrast  Result Date: 09/13/2017 CLINICAL DATA:  Shortness of breath. Left leg swelling  and pain. Abdominal pain and constipation. Acute DVT was shown on left lower extremity  venous Doppler. History of multiple myeloma. EXAM: CT ANGIOGRAPHY CHEST WITH CONTRAST TECHNIQUE: Multidetector CT imaging of the chest was performed using the standard protocol during bolus administration of intravenous contrast. Multiplanar CT image reconstructions and MIPs were obtained to evaluate the vascular anatomy. CONTRAST:  56m OMNIPAQUE IOHEXOL 350 MG/ML SOLN COMPARISON:  None. FINDINGS: Cardiovascular: Multiple acute pulmonary emboli are demonstrated. Multiple filling defects demonstrated in the distal main pulmonary arteries and extending into bilateral upper, middle, and lower lobe branches. Right ventricle is dilated with reflux of contrast material into the hepatic veins. RV to LV ratio measures 2.6. This suggests a high likelihood of right heart strain. No pericardial effusion. Normal caliber thoracic aorta with scattered calcifications. Mediastinum/Nodes: No enlarged mediastinal, hilar, or axillary lymph nodes. Thyroid gland, trachea, and esophagus demonstrate no significant findings.l Lungs/Pleura: Emphysematous changes in the lungs. Atelectasis in the lung bases. No focal consolidation. No pleural effusions. No pneumothorax. Calcified granulomas in the left apex. Airways are patent. Upper Abdomen: Calcified granulomas in the spleen. Musculoskeletal: Degenerative changes in the spine. No destructive bone lesions. Review of the MIP images confirms the above findings. IMPRESSION: 1. Positive examination for multiple bilateral central and segmental pulmonary emboli. Positive for acute PE with CT evidence of right heart strain (RV/LV Ratio = 2.6) consistent with at least submassive (intermediate risk) PE. The presence of right heart strain has been associated with an increased risk of morbidity and mortality. Please activate Code PE by paging [phone] 2. Emphysematous changes in the lungs.  No focal consolidation. These results were called by telephone at the time of interpretation on 09/13/2017 at  10:04 pm to Dr. WJannifer Franklin who verbally acknowledged these results. Aortic Atherosclerosis (ICD10-I70.0) and Emphysema (ICD10-J43.9). Electronically Signed   By: WLucienne CapersM.D.   On: 09/13/2017 22:10   UKoreaVenous Img Lower Unilateral Left  Result Date: 09/13/2017 CLINICAL DATA:  Left lower extremity pain and swelling for 2 days EXAM: LEFT LOWER EXTREMITY VENOUS DOPPLER ULTRASOUND TECHNIQUE: Gray-scale sonography with graded compression, as well as color Doppler and duplex ultrasound were performed to evaluate the lower extremity deep venous systems from the level of the common femoral vein and including the common femoral, femoral, profunda femoral, popliteal and calf veins including the posterior tibial, peroneal and gastrocnemius veins when visible. The superficial great saphenous vein was also interrogated. Spectral Doppler was utilized to evaluate flow at rest and with distal augmentation maneuvers in the common femoral, femoral and popliteal veins. COMPARISON:  None. FINDINGS: Contralateral Common Femoral Vein: Respiratory phasicity is normal and symmetric with the symptomatic side. No evidence of thrombus. Normal compressibility. Common Femoral Vein: No evidence of thrombus. Normal compressibility, respiratory phasicity and response to augmentation. Saphenofemoral Junction: No evidence of thrombus. Normal compressibility and flow on color Doppler imaging. Profunda Femoral Vein: No evidence of thrombus. Normal compressibility and flow on color Doppler imaging. Femoral Vein: Thrombus is noted with decreased compressibility Popliteal Vein: Thrombus is noted with decreased compressibility. Calf Veins: Thrombus is noted with decreased compressibility. Superficial Great Saphenous Vein: No evidence of thrombus. Normal compressibility. Venous Reflux:  None. Other Findings:  None. IMPRESSION: Diffuse deep venous thrombosis from the calf veins extending into the popliteal and superficial femoral vein.  Electronically Signed   By: MInez CatalinaM.D.   On: 09/13/2017 20:19     ASSESSMENT AND PLAN:   84year old female with past medical history of multiple myeloma, Dementia, hypertension, hyperlipidemia,  atrial fibrillation, osteonecrosis of the jaw who presents to the hospital due to left lower extremity swelling, chest pain.  1.  PE/DVT-this is the cause of patient's left lower extremity swelling and patient's chest pain.  Patient's Dopplers were positive for DVT and patient CT chest is also positive for pulmonary embolism. -Patient is not hypoxic.  No hemoptysis overnight.  We will switch from IV heparin to oral Eliquis today.  2.  History of multiple myeloma- patient is followed by oncology by Dr. Rogue Bussing.  - seen by Dr. Tasia Catchings today.  her most recent MM work up showed relatively stable M spike and light chain ratio.Marland Kitchen  She has got contrast study for chest PE protocol.  Recommend close monitor kidney function.  At high risk of ATN.  Encourage patient to increase oral hydration.  3.  History of dementia with behavioral disturbance-continue Risperdal, Remeron, Depakote.  Plan for discharge to Assisted living tomorrow with home health services.   All the records are reviewed and case discussed with Care Management/Social Worker. Management plans discussed with the patient, family and they are in agreement.  CODE STATUS: DNR  DVT Prophylaxis: Hep drip  TOTAL TIME TAKING CARE OF THIS PATIENT: 25 minutes.   POSSIBLE D/C IN 1-2 DAYS, DEPENDING ON CLINICAL CONDITION.   Henreitta Leber M.D on 09/15/2017 at 3:03 PM  Between 7am to 6pm - Pager - [phone]  After 6pm go to [email or web address] - [employer] [facility]  Office  [phone]  CC: Primary care physician; [facility]

## 2017-09-15 NOTE — Clinical Social Work Note (Signed)
CSW spoke with [place], and informed her that PT is recommending home health.  CSW will continue to follow patient's progress throughout discharge planning.  Jones Broom. [place], MSW, [place]  09/15/2017 5:24 PM

## 2017-09-15 NOTE — Care Management (Signed)
Patient admitted with bilateral PE and DVT.  She is on room air.  When she discharges, will discharge to [facility].  Requested order for home health nurse and physical therapy from attending.  Heads up referral to Sarah with [facility].  To discharge on Eliquis.  Provided 30 day trial coupon to patient's family

## 2017-09-15 NOTE — Progress Notes (Addendum)
Patient found on floor by Secretary. Patient stated that she did not know how she got there, is baseline confused. Denies complaints of pain. Dr. Verdell Carmine and Family notified. VSS no EKG changes noted. No skin injury noted. will continue to monitor.

## 2017-09-15 NOTE — Progress Notes (Signed)
[place] for Heparin Indication: DVT/ possible PE  Allergies  Allergen Reactions  . Biaxin [Clarithromycin] Diarrhea and Nausea And Vomiting  . Demerol [Meperidine] Nausea And Vomiting  . Hydrocodone Nausea And Vomiting  . Sulfa Antibiotics Hives  . Tramadol Diarrhea and Nausea And Vomiting  . Ultracet [Tramadol-Acetaminophen] Other (See Comments)    Patient Measurements: Height: _0  (157.5 cm) Weight: 96 lb 14.4 oz (44 kg) IBW/kg (Calculated) : 50.1  Vital Signs:    Labs: Recent Labs    09/13/17 1529 09/13/17 2043 09/14/17 0302 09/14/17 0626 09/14/17 0849 09/14/17 1452 09/15/17 0030  HGB 12.8  --  10.9*  --   --   --  11.0*  HCT 39.6  --  32.8*  --   --   --  32.0*  PLT 337  --  266  --   --   --  234  APTT  --  27  --   --   --   --   --   LABPROT  --  13.5  --   --   --   --   --   INR  --  1.04  --   --   --   --   --   HEPARINUNFRC  --   --   --  0.23*  --  0.29* 0.46  CREATININE 0.75  --  0.79  --   --   --   --   TROPONINI 0.22* 0.19* 0.14*  --  0.09*  --   --     Estimated Creatinine Clearance: 32.5 mL/min (by C-G formula based on SCr of 0.79 mg/dL).   Medical History: Past Medical History:  Diagnosis Date  . Anxiety   . Arthritis   . Atrial fibrillation ([place])   . Coronary arteriosclerosis   . History of blood transfusion 2014  . Hyperlipidemia   . Hypertension   . Multiple myeloma ([place])   . Multiple myeloma in relapse ([place]) 10/29/2014  . Osteonecrosis of jaw ([place])    left   Assessment: 84 y/o F with a h/o multiple myeoloma and atrial fibrillation on ASA for anticoagulation admitted with DVT and possible PE.  Goal of Therapy:  Heparin level 0.3-0.7 units/ml Monitor platelets by anticoagulation protocol: Yes   Plan:  Give 2250 units bolus x 1 Start heparin infusion at 700 units/hr Check anti-Xa level in 8 hours and daily while on heparin Continue to monitor H&H and platelets   04/07 AM heparin  level 0.23. 700 unit bolus and increase rate to 800 units/hr. Recheck in 8 hours.  4/7: HL@ 1452= 0.29. Will give 700 units bolus and increase drip to 900 units/hr. Recheck HL in 8 hrs.  4/8 0030 heparin level 0.46. Continue current regimen. Recheck in 8 hours to confirm.  Dan Scearce S 09/15/2017,12:55 AM

## 2017-09-15 NOTE — Plan of Care (Signed)
  Problem: Clinical Measurements: Goal: Ability to maintain clinical measurements within normal limits will improve Outcome: Progressing Goal: Cardiovascular complication will be avoided Outcome: Progressing   Problem: Safety: Goal: Ability to remain free from injury will improve Outcome: Progressing   

## 2017-09-15 NOTE — Progress Notes (Signed)
Initial Nutrition Assessment  DOCUMENTATION CODES:   Severe malnutrition in context of chronic illness  INTERVENTION:   Ensure Enlive po TID, each supplement provides 350 kcal and 20 grams of protein  Magic cup TID with meals, each supplement provides 290 kcal and 9 grams of protein  MVI daily   Bowel regimen per MD  Pt likely at high refeeding risk; recommend monitor K, Mg, and P labs when oral intake improves.   NUTRITION DIAGNOSIS:   Severe Malnutrition related to chronic illness(CHF, multiple myeloma, dementia ) as evidenced by 13 percent weight loss in 1 month, severe fat depletion, severe muscle depletion.  GOAL:   Patient will meet greater than or equal to 90% of their needs  MONITOR:   PO intake, Supplement acceptance, Labs, Weight trends, I & O's, Skin  REASON FOR ASSESSMENT:   Malnutrition Screening Tool    ASSESSMENT:   84 y.o. female with a known history of multiple myeloma, anxiety, dementia, chronic atrial fibrillation, coronary artery disease is brought in from [facility] memory care unit from [place] area by her son.  Son is concerned as the patient's left leg is swollen and painful, patient is also complaining of abdominal pain and constipated.  Intermittently reporting chest pain and troponin is elevated.  Left lower extremity venous Dopplers has revealed acute DVT.   Visited pt's room today. Unable to speak with pt r/t dementia; no family at bedside. Per chart, pt has lost 14lbs(13%) over the past month; this is severe weight loss. RD suspects poor appetite and oral intake pta. Pt documented to have eaten 100% of meals yesterday. Pt's lunch tray was untouched on her side table. RD will order supplements and MVI. Type 1 stool yesterday; bowel regimen per MD. Pt likely at high refeeding risk; recommend monitor K, Mg, and P labs when oral intake improves.   Medications reviewed and include: dulcolax, colace, fentanyl, megace, remeron, miralax,  oxycodone  Labs reviewed: K 3.7 wnl, BUN 33(H), Ca 7.6(L)- 4/7  Nutrition-Focused physical exam completed. Findings are severe fat and muscle depletions over entire body, and mild to moderate edema.   Diet Order:  DIET DYS 3 Room service appropriate? Yes; Fluid consistency: Thin  EDUCATION NEEDS:   Not appropriate for education at this time  Skin:  Reviewed RN Assessment  Last BM:  4/7- type 1  Height:   Ht Readings from Last 1 Encounters:  09/13/17 '5\' 2"'  (1.575 m)    Weight:   Wt Readings from Last 1 Encounters:  09/15/17 95 lb 0.3 oz (43.1 kg)    Ideal Body Weight:  50 kg  BMI:  Body mass index is 17.38 kg/m.  Estimated Nutritional Needs:   Kcal:  1150-1350kcal/day   Protein:  65-74g/day   Fluid:  >1.1L/day   Koleen Distance MS, RD, LDN Pager #[phone] After Hours Pager: [phone]

## 2017-09-15 NOTE — Progress Notes (Signed)
[name]   DOB:11-24-1924   QV#:[id number]    Subjective:   History is difficult to assess given patient's moderate/severe dementia. Patient sleeping comfortably.  She denies any pain.   Objective:  Vitals:   09/15/17 1846 09/15/17 1950  BP: (!) 150/82 138/64  Pulse: 96 94  Resp: 18 18  Temp:  98.3 F (36.8 C)  SpO2: 94% 94%     Intake/Output Summary (Last 24 hours) at 09/15/2017 1953 Last data filed at 09/15/2017 0718 Gross per 24 hour  Intake 90 ml  Output 300 ml  Net -210 ml    GENERAL Sleepy/easily arousable, no distress and comfortable.  Alone. EYES: no pallor or icterus OROPHARYNX: no thrush or ulceration. NECK: supple, no masses felt LYMPH:  no palpable lymphadenopathy in the cervical, axillary or inguinal regions LUNGS: decreased breath sounds to auscultation at bases and  No wheeze or crackles HEART/CVS: regular rate & rhythm and no murmurs; mild left lower extremity swelling compared to the right.  ABDOMEN: abdomen soft, tender  on deep palpation. and normal bowel sounds Musculoskeletal:no cyanosis of digits and no clubbing  PSYCH: Sleepy but easily arousable & oriented x 1.  NEURO: no focal motor/sensory deficits SKIN:  no rashes or significant lesions   Labs:  Lab Results  Component Value Date   WBC 5.6 09/15/2017   HGB 11.0 (L) 09/15/2017   HCT 32.0 (L) 09/15/2017   MCV 88.2 09/15/2017   PLT 234 09/15/2017   NEUTROABS 4.3 09/01/2017    Lab Results  Component Value Date   NA 137 09/14/2017   K 3.7 09/14/2017   CL 109 09/14/2017   CO2 22 09/14/2017    Studies:  Dg Chest 2 View  Result Date: 09/13/2017 CLINICAL DATA:  Weakness and known DVT EXAM: CHEST - 2 VIEW COMPARISON:  09/01/2017 FINDINGS: Cardiac shadow is stable. Aortic calcifications are noted. Right chest wall port is seen in satisfactory position. The lungs are clear. Calcified granuloma in the left base is again noted. No acute bony abnormality is seen. Chronic compression deformity at  the thoracolumbar junction is noted. IMPRESSION: Chronic changes without acute abnormality. Electronically Signed   By: Inez Catalina M.D.   On: 09/13/2017 20:30   Ct Angio Chest Pe W And/or Wo Contrast  Result Date: 09/13/2017 CLINICAL DATA:  Shortness of breath. Left leg swelling and pain. Abdominal pain and constipation. Acute DVT was shown on left lower extremity venous Doppler. History of multiple myeloma. EXAM: CT ANGIOGRAPHY CHEST WITH CONTRAST TECHNIQUE: Multidetector CT imaging of the chest was performed using the standard protocol during bolus administration of intravenous contrast. Multiplanar CT image reconstructions and MIPs were obtained to evaluate the vascular anatomy. CONTRAST:  80m OMNIPAQUE IOHEXOL 350 MG/ML SOLN COMPARISON:  None. FINDINGS: Cardiovascular: Multiple acute pulmonary emboli are demonstrated. Multiple filling defects demonstrated in the distal main pulmonary arteries and extending into bilateral upper, middle, and lower lobe branches. Right ventricle is dilated with reflux of contrast material into the hepatic veins. RV to LV ratio measures 2.6. This suggests a high likelihood of right heart strain. No pericardial effusion. Normal caliber thoracic aorta with scattered calcifications. Mediastinum/Nodes: No enlarged mediastinal, hilar, or axillary lymph nodes. Thyroid gland, trachea, and esophagus demonstrate no significant findings.l Lungs/Pleura: Emphysematous changes in the lungs. Atelectasis in the lung bases. No focal consolidation. No pleural effusions. No pneumothorax. Calcified granulomas in the left apex. Airways are patent. Upper Abdomen: Calcified granulomas in the spleen. Musculoskeletal: Degenerative changes in the spine. No destructive bone  lesions. Review of the MIP images confirms the above findings. IMPRESSION: 1. Positive examination for multiple bilateral central and segmental pulmonary emboli. Positive for acute PE with CT evidence of right heart strain (RV/LV  Ratio = 2.6) consistent with at least submassive (intermediate risk) PE. The presence of right heart strain has been associated with an increased risk of morbidity and mortality. Please activate Code PE by paging [phone]. 2. Emphysematous changes in the lungs.  No focal consolidation. These results were called by telephone at the time of interpretation on 09/13/2017 at 10:04 pm to Dr. Jannifer Franklin, who verbally acknowledged these results. Aortic Atherosclerosis (ICD10-I70.0) and Emphysema (ICD10-J43.9). Electronically Signed   By: Lucienne Capers M.D.   On: 09/13/2017 22:10   US Venous Img Lower Unilateral Left  Result Date: 09/13/2017 CLINICAL DATA:  Left lower extremity pain and swelling for 2 days EXAM: LEFT LOWER EXTREMITY VENOUS DOPPLER ULTRASOUND TECHNIQUE: Gray-scale sonography with graded compression, as well as color Doppler and duplex ultrasound were performed to evaluate the lower extremity deep venous systems from the level of the common femoral vein and including the common femoral, femoral, profunda femoral, popliteal and calf veins including the posterior tibial, peroneal and gastrocnemius veins when visible. The superficial great saphenous vein was also interrogated. Spectral Doppler was utilized to evaluate flow at rest and with distal augmentation maneuvers in the common femoral, femoral and popliteal veins. COMPARISON:  None. FINDINGS: Contralateral Common Femoral Vein: Respiratory phasicity is normal and symmetric with the symptomatic side. No evidence of thrombus. Normal compressibility. Common Femoral Vein: No evidence of thrombus. Normal compressibility, respiratory phasicity and response to augmentation. Saphenofemoral Junction: No evidence of thrombus. Normal compressibility and flow on color Doppler imaging. Profunda Femoral Vein: No evidence of thrombus. Normal compressibility and flow on color Doppler imaging. Femoral Vein: Thrombus is noted with decreased compressibility Popliteal Vein:  Thrombus is noted with decreased compressibility. Calf Veins: Thrombus is noted with decreased compressibility. Superficial Great Saphenous Vein: No evidence of thrombus. Normal compressibility. Venous Reflux:  None. Other Findings:  None. IMPRESSION: Diffuse deep venous thrombosis from the calf veins extending into the popliteal and superficial femoral vein. Electronically Signed   By: Inez Catalina M.D.   On: 09/13/2017 20:19    Assessment & Plan:   #84 year old female patient with multiple medical problems including multiple myeloma/dementia is currently admitted the hospital for bilateral PE/left lower extremity DVT  #Multiple myeloma-most recently status post daratumumab; partial response noted on the treatment.  Patient is currently on surveillance given moderate dementia/and stable disease.  Continue hold further therapy at this time.  #Bilateral PE/with right heart strain; left lower extremity DVT-no clear provoking factor noted [although multiple myeloma/treatments-do put patient at risk for DVT/PE].  Recommend continued anticoagulation with IV heparin/transition over to novel anticoagulants like Eliquis or Xarelto.  Patient had a high risk for falls given the dementia.  #Recommend follow-up in the cancer center in approximately 3 weeks post discharge.   Cammie Sickle, MD 09/15/2017  7:53 PM

## 2017-09-16 DIAGNOSIS — E43 Unspecified severe protein-calorie malnutrition: Secondary | ICD-10-CM

## 2017-09-16 MED ORDER — FENTANYL 25 MCG/HR TD PT72: 25.0000 ug | MEDICATED_PATCH | TRANSDERMAL | 0 refills | Status: DC

## 2017-09-16 MED ORDER — HEPARIN SOD (PORK) LOCK FLUSH 100 UNIT/ML IV SOLN
500.0000 [IU] | Freq: Once | INTRAVENOUS | Status: AC
  Administered 2017-09-16: 500 [IU] via INTRAVENOUS
  Filled 2017-09-16: qty 5

## 2017-09-16 MED ORDER — APIXABAN 5 MG PO TABS: ORAL_TABLET | ORAL | Status: DC

## 2017-09-16 MED ORDER — OXYCODONE HCL 5 MG PO TABS: 2.5000 mg | ORAL_TABLET | Freq: Four times a day (QID) | ORAL | 0 refills | Status: DC | PRN

## 2017-09-16 NOTE — Progress Notes (Signed)
Son called to inform they are at [place] and will be here to pick up patient shortly. Will continue to monitor patient.

## 2017-09-16 NOTE — Plan of Care (Signed)
  Problem: Education: Goal: Knowledge of General Education information will improve Outcome: Adequate for Discharge   Problem: Health Behavior/Discharge Planning: Goal: Ability to manage health-related needs will improve Outcome: Adequate for Discharge   Problem: Clinical Measurements: Goal: Ability to maintain clinical measurements within normal limits will improve Outcome: Adequate for Discharge Goal: Will remain free from infection Outcome: Adequate for Discharge Goal: Cardiovascular complication will be avoided Outcome: Adequate for Discharge   Problem: Safety: Goal: Ability to remain free from injury will improve Outcome: Adequate for Discharge

## 2017-09-16 NOTE — Care Management Important Message (Signed)
Copy left in patient's room. 

## 2017-09-16 NOTE — Discharge Summary (Addendum)
[place] [facility] [place] NAME: Reyana Leisey    MR#:  [id number]  DATE OF BIRTH:  06-07-1925  DATE OF ADMISSION:  09/13/2017 ADMITTING PHYSICIAN: Nicholes Mango, MD  DATE OF DISCHARGE: 09/16/2017  PRIMARY CARE PHYSICIAN: [place], [facility]    ADMISSION DIAGNOSIS:  Troponin I above reference range [R74.8] Pain and swelling of lower extremity, left [M79.605, M79.89]  DISCHARGE DIAGNOSIS:  Active Problems:   Chest pain   Acute pulmonary embolism without acute cor pulmonale (HCC)   Acute deep vein thrombosis (DVT) of distal vein of left lower extremity (HCC)   Protein-calorie malnutrition, severe   SECONDARY DIAGNOSIS:   Past Medical History:  Diagnosis Date  . Anxiety   . Arthritis   . Atrial fibrillation ([place])   . Coronary arteriosclerosis   . History of blood transfusion 2014  . Hyperlipidemia   . Hypertension   . Multiple myeloma ([place])   . Multiple myeloma in relapse ([place]) 10/29/2014  . 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  1.  PE/DVT-this was the cause of patient's left lower extremity swelling and patient's chest pain.  Patient's Dopplers were positive for DVT and patient CT chest is also positive for pulmonary embolism. -Patient was not hypoxic.    She was admitted to the hospital and started on IV heparin drip.  She has tolerated that well.  She has no bleeding or hemoptysis or any chest pain.  She was switched over from heparin drip to oral Eliquis and is being discharged on that.  2.  History of multiple myeloma- patient is followed by oncology by Dr. Rogue Bussing.  - seen by Dr. Tasia Catchings yesterday.  her most recent MM work up showed relatively stable M spikeand light chain ratio..   -The fact that she got contrast for her CT study on admission.  Her renal function closely needs to be followed as an outpatient.  Continue further care as per oncology as an outpatient.  3.  History of dementia with behavioral disturbance- She will continue Risperdal, Remeron, Depakote.  4.  Chronic pain-secondary to underlying multiple myeloma.  Patient will continue her oxycodone and fentanyl patch.    DISCHARGE CONDITIONS:   Stable  CONSULTS OBTAINED:  Treatment Team:  Earlie Server, MD  DRUG ALLERGIES:   Allergies  Allergen Reactions  . Biaxin [Clarithromycin] Diarrhea and Nausea And Vomiting  . Demerol [Meperidine] Nausea And Vomiting  . Hydrocodone Nausea And Vomiting  . Sulfa Antibiotics Hives  . Tramadol Diarrhea and Nausea And Vomiting  . Ultracet [Tramadol-Acetaminophen] Other (See Comments)    DISCHARGE MEDICATIONS:   Allergies as of 09/16/2017      Reactions   Biaxin [clarithromycin] Diarrhea, Nausea And Vomiting   Demerol [meperidine] Nausea And Vomiting   Hydrocodone Nausea And Vomiting   Sulfa Antibiotics Hives   Tramadol Diarrhea, Nausea And Vomiting   Ultracet [tramadol-acetaminophen] Other (See Comments)      Medication List    STOP taking these medications   aspirin EC 81 MG tablet     TAKE these medications   acyclovir 400 MG tablet Commonly known as:  ZOVIRAX TAKE 1 TABLET BY MOUTH ONCE DAILY TO  PREVENT  SHINGLES What changed:    how much to take  how  to take this  when to take this  additional instructions   apixaban 5 MG Tabs tablet Commonly known as:  ELIQUIS Take 10 mg PO BID X 6 days and then take 5 mg PO BID thereafter.   bisacodyl 5 MG EC tablet Commonly known as:  DULCOLAX Take 1 tablet (5 mg total) by mouth daily as needed for moderate constipation.   COLACE PO Take 2 tablets by mouth at bedtime.   divalproex 125 MG capsule Commonly known as:  DEPAKOTE SPRINKLE Take 1 capsule by mouth daily.   fentaNYL 25 MCG/HR  patch Commonly known as:  DURAGESIC - dosed mcg/hr Place 1 patch (25 mcg total) onto the skin every 3 (three) days.   megestrol 40 MG tablet Commonly known as:  MEGACE Take 2 tablets (80 mg total) by mouth 2 (two) times daily.   Melatonin 5 MG Tabs Take 1 tablet by mouth daily.   mirtazapine 30 MG tablet Commonly known as:  REMERON TAKE 1 TABLET BY MOUTH AT BEDTIME   oxyCODONE 5 MG immediate release tablet Commonly known as:  Oxy IR/ROXICODONE Take 0.5 tablets (2.5 mg total) by mouth every 6 (six) hours as needed for moderate pain.   polyethylene glycol packet Commonly known as:  MIRALAX / GLYCOLAX Take 17 g by mouth daily.   risperiDONE 1 MG tablet Commonly known as:  RISPERDAL Take 1 tablet (1 mg total) by mouth at bedtime.         DISCHARGE INSTRUCTIONS:   DIET:  Regular diet  Dysphagia III with thin liquids and aspiration precautions.    DISCHARGE CONDITION:  Stable  ACTIVITY:  Activity as tolerated  OXYGEN:  Home Oxygen: No.   Oxygen Delivery: room air  DISCHARGE LOCATION:  Assisted Living with Home Health PT, RN.   If you experience worsening of your admission symptoms, develop shortness of breath, life threatening emergency, suicidal or homicidal thoughts you must seek medical attention immediately by calling 911 or calling your MD immediately  if symptoms less severe.  You Must read complete instructions/literature along with all the possible adverse reactions/side effects for all the Medicines you take and that have been prescribed to you. Take any new Medicines after you have completely understood and accpet all the possible adverse reactions/side effects.   Please note  You were cared for by a hospitalist during your hospital stay. If you have any questions about your discharge medications or the care you received while you were in the hospital after you are discharged, you can call the unit and asked to speak with the hospitalist on call if the  hospitalist that took care of you is not available. Once you are discharged, your primary care physician will handle any further medical issues. Please note that NO REFILLS for any discharge medications will be authorized once you are discharged, as it is imperative that you return to your primary care physician (or establish a relationship with a primary care physician if you do not have one) for your aftercare needs so that they can reassess your need for medications and monitor your lab values.     Today   No acute events overnight.  Not Hypoxic.  Complaining of some vague shoulder pain.   VITAL SIGNS:  Blood pressure (!) 152/77, pulse 88, temperature 97.6 F (36.4 C), temperature source Oral, resp. rate 14, height '5\' 2"'  (1.575 m), weight 43.1 kg (95 lb 0.3 oz), SpO2 97 %.  I/O:    Intake/Output Summary (Last 24 hours) at  09/16/2017 1039 Last data filed at 09/16/2017 0900 Gross per 24 hour  Intake -  Output 850 ml  Net -850 ml    PHYSICAL EXAMINATION:    GENERAL:  84 y.o.-year-old patient lying in bed in no acute distress.  EYES: Pupils equal, round, reactive to light and accommodation. No scleral icterus. Extraocular muscles intact.  HEENT: Head atraumatic, normocephalic. Oropharynx and nasopharynx clear.  NECK:  Supple, no jugular venous distention. No thyroid enlargement, no tenderness.  LUNGS: Normal breath sounds bilaterally, no wheezing, rales, rhonchi. No use of accessory muscles of respiration.  CARDIOVASCULAR: S1, S2 normal. No murmurs, rubs, or gallops.  ABDOMEN: Soft, nontender, nondistended. Bowel sounds present. No organomegaly or mass.  EXTREMITIES: No cyanosis, clubbing or edema b/l.    NEUROLOGIC: Cranial nerves II through XII are intact. No focal Motor or sensory deficits b/l.  Globally weak.  PSYCHIATRIC: The patient is alert and oriented x 1.  SKIN: No obvious rash, lesion, or ulcer.  DATA REVIEW:   CBC Recent Labs  Lab 09/15/17 0030  WBC 5.6  HGB 11.0*   HCT 32.0*  PLT 234    Chemistries  Recent Labs  Lab 09/14/17 0302  NA 137  K 3.7  CL 109  CO2 22  GLUCOSE 105*  BUN 33*  CREATININE 0.79  CALCIUM 7.6*    Cardiac Enzymes Recent Labs  Lab 09/14/17 0849  TROPONINI 0.09*    Microbiology Results  Results for orders placed or performed during the hospital encounter of 09/13/17  MRSA PCR Screening     Status: None   Collection Time: 09/13/17 10:21 PM  Result Value Ref Range Status   MRSA by PCR NEGATIVE NEGATIVE Final    Comment:        The GeneXpert MRSA Assay (FDA approved for NASAL specimens only), is one component of a comprehensive MRSA colonization surveillance program. It is not intended to diagnose MRSA infection nor to guide or monitor treatment for MRSA infections. Performed at [facility], [address], [place], [place] [address]     RADIOLOGY:  No results found.    Management plans discussed with the patient, family and they are in agreement.  CODE STATUS:     Code Status Orders  (From admission, onward)        Start     Ordered   09/13/17 2208  Do not attempt resuscitation (DNR)  Continuous    Question Answer Comment  In the event of cardiac or respiratory ARREST Do not call a "code blue"   In the event of cardiac or respiratory ARREST Do not perform Intubation, CPR, defibrillation or ACLS   In the event of cardiac or respiratory ARREST Use medication by any route, position, wound care, and other measures to relive pain and suffering. May use oxygen, suction and manual treatment of airway obstruction as needed for comfort.   Comments rn may pronounce      09/13/17 2207  Advance Directive Documentation     Most Recent Value  Type of Advance Directive  Living will, Healthcare Power of Attorney  Pre-existing out of facility DNR order (yellow form or pink MOST form)  -  "MOST" Form in Place?  -      TOTAL TIME TAKING CARE OF THIS PATIENT: 40 minutes.    Henreitta Leber  M.D on 09/16/2017 at 10:39 AM  Between 7am to 6pm - Pager - [phone]  After 6pm go to [email or web address] - [facility]  Office  [phone]  CC: Primary care physician; [facility]

## 2017-09-16 NOTE — Care Management Note (Signed)
Case Management Note  Patient Details  Name: [name] MRN: [id number] Date of Birth: [birth year]   Judson Roch with [facility] notified of discharge to [facility] today.   Subjective/Objective:                    Action/Plan:   Expected Discharge Date:  09/16/17               Expected Discharge Plan:  Assisted Living / Rest Home(with home health )  In-House Referral:     Discharge planning Services  CM Consult  Post Acute Care Choice:    Choice offered to:  Adult Children  DME Arranged:    DME Agency:     HH Arranged:  RN, PT HH Agency:  [facility]  Status of Service:  Completed, signed off  If discussed at [place] of Stay Meetings, dates discussed:    Additional Comments:  Beverly Sessions, RN 09/16/2017, 2:14 PM

## 2017-09-16 NOTE — Progress Notes (Signed)
Report given to [place] at [place]. Family will transport patient to facility. Will continue to monitor patient. Resting comfortably in bed.

## 2017-09-16 NOTE — NC FL2 (Signed)
[place] LEVEL OF CARE SCREENING TOOL     IDENTIFICATION  Patient Name: [name] Birthdate: Dec 19, 1924 Sex: female Admission Date (Current Location): 09/13/2017  [place] and [place] Number:  Engineering geologist and Address:  [facility], [address] [place], [place] [address]      Provider Number: [id number]  Attending Physician Name and Address:  Henreitta Leber, MD  Relative Name and Phone Number:  Corliss Coggeshall (son) [phone]    Current Level of Care: Hospital Recommended Level of Care: Memory Care Prior Approval Number:    Date Approved/Denied:   PASRR Number:    Discharge Plan: Domiciliary (Rest home)([place] [facility])    Current Diagnoses: Patient Active Problem List   Diagnosis Date Noted  . Protein-calorie malnutrition, severe 09/16/2017  . Acute pulmonary embolism without acute cor pulmonale (HCC)   . Acute deep vein thrombosis (DVT) of distal vein of left lower extremity ([place])   . Chest pain 09/13/2017  . HCAP (healthcare-associated pneumonia) 08/07/2017  . Left leg weakness 08/04/2017  . Dehydration 02/19/2017  . Pain in right hip 12/28/2015  . Pain in lower back 12/28/2015  . Rheumatoid arthritis ([place]) 03/29/2015  . Inflammatory polyarthropathy ([place]) 03/29/2015  . Multiple myeloma not having achieved remission ([place]) 10/29/2014  . Absolute anemia 10/24/2014  . CAD in native artery 10/24/2014  . Benign essential HTN 10/24/2014  . MI (mitral incompetence) 05/25/2014  . AF (paroxysmal atrial fibrillation) ([place]) 05/25/2014  . Paroxysmal atrial fibrillation (HCC) 05/25/2014    Orientation RESPIRATION BLADDER Height & Weight     Self, Place  Normal Continent Weight: 95 lb 0.3 oz (43.1 kg) Height:  '5\' 2"'  (157.5 cm)  BEHAVIORAL SYMPTOMS/MOOD NEUROLOGICAL BOWEL NUTRITION STATUS      Continent Diet(Dysphagia 3, thin liquids)  AMBULATORY STATUS COMMUNICATION OF NEEDS Skin   Supervision Verbally Normal                        Personal Care Assistance Level of Assistance  Bathing, Feeding, Dressing Bathing Assistance: Limited assistance Feeding assistance: Independent Dressing Assistance: Limited assistance     Functional Limitations Info             SPECIAL CARE FACTORS FREQUENCY                       Contractures Contractures Info: Not present    Additional Factors Info  Code Status, Allergies Code Status Info: DNR Allergies Info: Biaxin Clarithromycin, Demerol Meperidine, Hydrocodone, Sulfa Antibiotics, Tramadol, Ultracet Tramadol-acetaminophen           Current Medications (09/16/2017):  This is the current hospital active medication list Current Facility-Administered Medications  Medication Dose Route Frequency Provider Last Rate Last Dose  . acyclovir (ZOVIRAX) 200 MG capsule 400 mg  400 mg Oral QHS Gouru, Aruna, MD   400 mg at 09/15/17 2200  . apixaban (ELIQUIS) tablet 10 mg  10 mg Oral BID Henreitta Leber, MD   10 mg at 09/15/17 2201   Followed by  . [START ON 09/22/2017] apixaban (ELIQUIS) tablet 5 mg  5 mg Oral BID Henreitta Leber, MD      . bisacodyl (DULCOLAX) EC tablet 5 mg  5 mg Oral Daily PRN Gouru, Aruna, MD      . bisacodyl (DULCOLAX) suppository 10 mg  10 mg Rectal Daily Gouru, Aruna, MD   10 mg at 09/15/17 1108  . divalproex (DEPAKOTE SPRINKLE) capsule 125  mg  125 mg Oral Daily Gouru, Aruna, MD   125 mg at 09/15/17 1109  . docusate sodium (COLACE) capsule 100 mg  100 mg Oral BID Gouru, Aruna, MD   100 mg at 09/15/17 2201  . feeding supplement (ENSURE ENLIVE) (ENSURE ENLIVE) liquid 237 mL  237 mL Oral TID BM Henreitta Leber, MD   237 mL at 09/15/17 2202  . fentaNYL (DURAGESIC - dosed mcg/hr) patch 25 mcg  25 mcg Transdermal Q72H Henreitta Leber, MD   25 mcg at 09/15/17 1528  . megestrol (MEGACE) tablet 80 mg  80 mg Oral BID Henreitta Leber, MD   80 mg at 09/15/17 1528  . Melatonin TABS 5 mg  1 tablet Oral QHS Henreitta Leber, MD   5 mg at  09/15/17 2201  . mirtazapine (REMERON) tablet 30 mg  30 mg Oral QHS Gouru, Aruna, MD   30 mg at 09/15/17 2200  . multivitamin with minerals tablet 1 tablet  1 tablet Oral Daily Henreitta Leber, MD   1 tablet at 09/15/17 1529  . ondansetron (ZOFRAN) tablet 4 mg  4 mg Oral Q6H PRN Gouru, Aruna, MD       Or  . ondansetron (ZOFRAN) injection 4 mg  4 mg Intravenous Q6H PRN Gouru, Aruna, MD      . oxyCODONE (Oxy IR/ROXICODONE) immediate release tablet 2.5 mg  2.5 mg Oral Q6H PRN Gouru, Aruna, MD   2.5 mg at 09/15/17 1126  . polyethylene glycol (MIRALAX / GLYCOLAX) packet 17 g  17 g Oral QHS PRN Gouru, Aruna, MD      . polyethylene glycol (MIRALAX / GLYCOLAX) packet 17 g  17 g Oral Daily Gouru, Aruna, MD   17 g at 09/15/17 1107  . risperiDONE (RISPERDAL) tablet 1 mg  1 mg Oral QHS Gouru, Aruna, MD   1 mg at 09/15/17 2201  . sodium chloride flush (NS) 0.9 % injection 10-40 mL  10-40 mL Intracatheter PRN Henreitta Leber, MD       Facility-Administered Medications Ordered in Other Encounters  Medication Dose Route Frequency Provider Last Rate Last Dose  . sodium chloride 0.9 % injection 10 mL  10 mL Intravenous PRN Forest Gleason, MD   10 mL at 03/01/15 1305     Discharge Medications:   STOP taking these medications   aspirin EC 81 MG tablet     TAKE these medications   acyclovir 400 MG tablet Commonly known as:  ZOVIRAX TAKE 1 TABLET BY MOUTH ONCE DAILY TO  PREVENT  SHINGLES What changed:    how much to take  how to take this  when to take this  additional instructions   apixaban 5 MG Tabs tablet Commonly known as:  ELIQUIS Take 10 mg PO BID X 6 days and then take 5 mg PO BID thereafter.   bisacodyl 5 MG EC tablet Commonly known as:  DULCOLAX Take 1 tablet (5 mg total) by mouth daily as needed for moderate constipation.   COLACE PO Take 2 tablets by mouth at bedtime.   divalproex 125 MG capsule Commonly known as:  DEPAKOTE SPRINKLE Take 1 capsule by mouth daily.    fentaNYL 25 MCG/HR patch Commonly known as:  DURAGESIC - dosed mcg/hr Place 1 patch (25 mcg total) onto the skin every 3 (three) days.   megestrol 40 MG tablet Commonly known as:  MEGACE Take 2 tablets (80 mg total) by mouth 2 (two) times daily.   Melatonin 5 MG  Tabs Take 1 tablet by mouth daily.   mirtazapine 30 MG tablet Commonly known as:  REMERON TAKE 1 TABLET BY MOUTH AT BEDTIME   oxyCODONE 5 MG immediate release tablet Commonly known as:  Oxy IR/ROXICODONE Take 0.5 tablets (2.5 mg total) by mouth every 6 (six) hours as needed for moderate pain.   polyethylene glycol packet Commonly known as:  MIRALAX / GLYCOLAX Take 17 g by mouth daily.   risperiDONE 1 MG tablet Commonly known as:  RISPERDAL Take 1 tablet (1 mg total) by mouth at bedtime.         Relevant Imaging Results:  Relevant Lab Results:   Additional Information [phone]  Ross Ludwig, LCSWA

## 2017-09-16 NOTE — Clinical Social Work Note (Addendum)
CSW faxed discharge summary and FL2 to [facility].  Awaiting phone call for when they are able to accept patient.  CSW to continue to follow patient's progress throughout discharge planning.  12:15pm CSW received phone call from [facility], they can accept patient back today, CSW will facilitate discharge planning.  2:35pm Patient to be d/c'ed today to [facility].  Patient and family agreeable to plans will transport via family vehicle RN to call report.  Jones Broom. [place], MSW, [place]  09/16/2017 11:21 AM

## 2017-09-16 NOTE — Progress Notes (Signed)
Discharge instructions given to patient, son, and daughter in law at bediside. Son verbalized understanding with no further questions or concerns. Discharge packet given, instructed to give to staff upon arrival to facility. IV taken out, tele monitor taken off, and chest port de-accessed. Family transported patient to [facility] in stable conditions.

## 2017-10-05 ENCOUNTER — Encounter: Payer: Self-pay | Admitting: *Deleted

## 2017-10-05 ENCOUNTER — Other Ambulatory Visit: Payer: Self-pay

## 2017-10-05 ENCOUNTER — Emergency Department: Payer: Medicare Other

## 2017-10-05 ENCOUNTER — Inpatient Hospital Stay
Admission: EM | Admit: 2017-10-05 | Discharge: 2017-10-07 | DRG: 536 | Disposition: A | Discharge status: Hospice | Payer: Medicare Other | Attending: Internal Medicine | Admitting: Internal Medicine

## 2017-10-05 DIAGNOSIS — I2510 Atherosclerotic heart disease of native coronary artery without angina pectoris: Secondary | ICD-10-CM | POA: Diagnosis present

## 2017-10-05 DIAGNOSIS — W19XXXA Unspecified fall, initial encounter: Secondary | ICD-10-CM | POA: Diagnosis present

## 2017-10-05 DIAGNOSIS — Z882 Allergy status to sulfonamides status: Secondary | ICD-10-CM | POA: Diagnosis not present

## 2017-10-05 DIAGNOSIS — Z7401 Bed confinement status: Secondary | ICD-10-CM

## 2017-10-05 DIAGNOSIS — W1830XA Fall on same level, unspecified, initial encounter: Secondary | ICD-10-CM | POA: Diagnosis present

## 2017-10-05 DIAGNOSIS — Z86718 Personal history of other venous thrombosis and embolism: Secondary | ICD-10-CM | POA: Diagnosis not present

## 2017-10-05 DIAGNOSIS — Z9049 Acquired absence of other specified parts of digestive tract: Secondary | ICD-10-CM | POA: Diagnosis not present

## 2017-10-05 DIAGNOSIS — Z881 Allergy status to other antibiotic agents status: Secondary | ICD-10-CM

## 2017-10-05 DIAGNOSIS — M1990 Unspecified osteoarthritis, unspecified site: Secondary | ICD-10-CM | POA: Diagnosis present

## 2017-10-05 DIAGNOSIS — Z8249 Family history of ischemic heart disease and other diseases of the circulatory system: Secondary | ICD-10-CM | POA: Diagnosis not present

## 2017-10-05 DIAGNOSIS — Z90710 Acquired absence of both cervix and uterus: Secondary | ICD-10-CM

## 2017-10-05 DIAGNOSIS — S72009A Fracture of unspecified part of neck of unspecified femur, initial encounter for closed fracture: Secondary | ICD-10-CM

## 2017-10-05 DIAGNOSIS — S72002A Fracture of unspecified part of neck of left femur, initial encounter for closed fracture: Secondary | ICD-10-CM | POA: Diagnosis not present

## 2017-10-05 DIAGNOSIS — Y92129 Unspecified place in nursing home as the place of occurrence of the external cause: Secondary | ICD-10-CM | POA: Diagnosis not present

## 2017-10-05 DIAGNOSIS — Z96652 Presence of left artificial knee joint: Secondary | ICD-10-CM | POA: Diagnosis present

## 2017-10-05 DIAGNOSIS — I4891 Unspecified atrial fibrillation: Secondary | ICD-10-CM | POA: Diagnosis present

## 2017-10-05 DIAGNOSIS — I480 Paroxysmal atrial fibrillation: Secondary | ICD-10-CM | POA: Diagnosis present

## 2017-10-05 DIAGNOSIS — M064 Inflammatory polyarthropathy: Secondary | ICD-10-CM | POA: Diagnosis present

## 2017-10-05 DIAGNOSIS — Z515 Encounter for palliative care: Secondary | ICD-10-CM | POA: Diagnosis not present

## 2017-10-05 DIAGNOSIS — C9002 Multiple myeloma in relapse: Secondary | ICD-10-CM | POA: Diagnosis present

## 2017-10-05 DIAGNOSIS — E785 Hyperlipidemia, unspecified: Secondary | ICD-10-CM | POA: Diagnosis present

## 2017-10-05 DIAGNOSIS — Z885 Allergy status to narcotic agent status: Secondary | ICD-10-CM

## 2017-10-05 DIAGNOSIS — F0390 Unspecified dementia without behavioral disturbance: Secondary | ICD-10-CM | POA: Diagnosis present

## 2017-10-05 DIAGNOSIS — M069 Rheumatoid arthritis, unspecified: Secondary | ICD-10-CM | POA: Diagnosis present

## 2017-10-05 DIAGNOSIS — Z66 Do not resuscitate: Secondary | ICD-10-CM | POA: Diagnosis present

## 2017-10-05 DIAGNOSIS — I10 Essential (primary) hypertension: Secondary | ICD-10-CM | POA: Diagnosis present

## 2017-10-05 DIAGNOSIS — Z86711 Personal history of pulmonary embolism: Secondary | ICD-10-CM

## 2017-10-05 DIAGNOSIS — Z809 Family history of malignant neoplasm, unspecified: Secondary | ICD-10-CM | POA: Diagnosis not present

## 2017-10-05 DIAGNOSIS — Z7901 Long term (current) use of anticoagulants: Secondary | ICD-10-CM

## 2017-10-05 HISTORY — DX: Other pulmonary embolism without acute cor pulmonale: I26.99

## 2017-10-05 HISTORY — DX: Acute embolism and thrombosis of unspecified deep veins of unspecified lower extremity: I82.409

## 2017-10-05 LAB — CBC WITH DIFFERENTIAL/PLATELET
Basophils Absolute: 0 10*3/uL (ref 0–0.1)
Basophils Relative: 1 %
EOS ABS: 0.1 10*3/uL (ref 0–0.7)
Eosinophils Relative: 2 %
HEMATOCRIT: 37.6 % (ref 35.0–47.0)
HEMOGLOBIN: 12.8 g/dL (ref 12.0–16.0)
Lymphocytes Relative: 31 %
Lymphs Abs: 1.5 10*3/uL (ref 1.0–3.6)
MCH: 30.5 pg (ref 26.0–34.0)
MCHC: 34 g/dL (ref 32.0–36.0)
MCV: 89.5 fL (ref 80.0–100.0)
MONOS PCT: 14 %
Monocytes Absolute: 0.7 10*3/uL (ref 0.2–0.9)
NEUTROS ABS: 2.5 10*3/uL (ref 1.4–6.5)
NEUTROS PCT: 52 %
Platelets: 285 10*3/uL (ref 150–440)
RBC: 4.19 MIL/uL (ref 3.80–5.20)
RDW: 15.5 % — ABNORMAL HIGH (ref 11.5–14.5)
WBC: 4.8 10*3/uL (ref 3.6–11.0)

## 2017-10-05 LAB — BASIC METABOLIC PANEL
Anion gap: 8 (ref 5–15)
BUN: 19 mg/dL (ref 6–20)
CHLORIDE: 106 mmol/L (ref 101–111)
CO2: 23 mmol/L (ref 22–32)
CREATININE: 0.59 mg/dL (ref 0.44–1.00)
Calcium: 8.4 mg/dL — ABNORMAL LOW (ref 8.9–10.3)
GFR calc non Af Amer: >60 mL/min (ref >60)
Glucose, Bld: 87 mg/dL (ref 65–99)
Potassium: 4.3 mmol/L (ref 3.5–5.1)
Sodium: 137 mmol/L (ref 135–145)

## 2017-10-05 LAB — PROTIME-INR
INR: 1.03
PROTHROMBIN TIME: 13.4 s (ref 11.4–15.2)

## 2017-10-05 LAB — TYPE AND SCREEN
ABO/RH(D): A POS
ANTIBODY SCREEN: NEGATIVE

## 2017-10-05 MED ORDER — MORPHINE SULFATE (PF) 2 MG/ML IV SOLN
1.0000 mg | INTRAVENOUS | Status: DC | PRN
  Administered 2017-10-06 – 2017-10-07 (×4): 1 mg via INTRAVENOUS
  Filled 2017-10-05 (×4): qty 1

## 2017-10-05 MED ORDER — POLYETHYLENE GLYCOL 3350 17 G PO PACK
17.0000 g | PACK | Freq: Every day | ORAL | Status: DC
  Filled 2017-10-05: qty 1

## 2017-10-05 MED ORDER — SODIUM CHLORIDE 0.9 % IV SOLN
INTRAVENOUS | Status: DC
  Administered 2017-10-05: 1000 mL via INTRAVENOUS
  Administered 2017-10-06 (×2): via INTRAVENOUS

## 2017-10-05 MED ORDER — MEGESTROL ACETATE 20 MG PO TABS
80.0000 mg | ORAL_TABLET | Freq: Two times a day (BID) | ORAL | Status: DC
  Administered 2017-10-06: 80 mg via ORAL
  Filled 2017-10-05: qty 4
  Filled 2017-10-05: qty 2
  Filled 2017-10-05: qty 4
  Filled 2017-10-05 (×2): qty 2

## 2017-10-05 MED ORDER — MIRTAZAPINE 15 MG PO TABS
30.0000 mg | ORAL_TABLET | Freq: Every day | ORAL | Status: DC
  Administered 2017-10-06: 30 mg via ORAL
  Filled 2017-10-05: qty 2

## 2017-10-05 MED ORDER — BISACODYL 5 MG PO TBEC: 5.0000 mg | DELAYED_RELEASE_TABLET | Freq: Every day | ORAL | Status: DC | PRN

## 2017-10-05 MED ORDER — OXYCODONE-ACETAMINOPHEN 5-325 MG PO TABS: 1.0000 | ORAL_TABLET | Freq: Four times a day (QID) | ORAL | Status: DC | PRN

## 2017-10-05 MED ORDER — RISPERIDONE 1 MG PO TABS
1.0000 mg | ORAL_TABLET | Freq: Every day | ORAL | Status: DC
  Administered 2017-10-06: 1 mg via ORAL
  Filled 2017-10-05 (×3): qty 1

## 2017-10-05 MED ORDER — MORPHINE SULFATE (PF) 2 MG/ML IV SOLN: 2.0000 mg | Freq: Once | INTRAVENOUS | Status: DC

## 2017-10-05 MED ORDER — DOCUSATE SODIUM 100 MG PO CAPS
100.0000 mg | ORAL_CAPSULE | Freq: Two times a day (BID) | ORAL | Status: DC
  Administered 2017-10-06: 100 mg via ORAL
  Filled 2017-10-05 (×2): qty 1

## 2017-10-05 MED ORDER — CHLORHEXIDINE GLUCONATE 4 % EX LIQD
Freq: Once | CUTANEOUS | Status: AC
  Administered 2017-10-05: 23:00:00 via TOPICAL

## 2017-10-05 MED ORDER — CEFAZOLIN SODIUM-DEXTROSE 2-4 GM/100ML-% IV SOLN
2.0000 g | INTRAVENOUS | Status: AC
  Filled 2017-10-05: qty 100

## 2017-10-05 MED ORDER — DIVALPROEX SODIUM 125 MG PO CSDR
125.0000 mg | DELAYED_RELEASE_CAPSULE | Freq: Every day | ORAL | Status: DC
Start: 2017-10-06 — End: 2017-10-07
  Administered 2017-10-06 – 2017-10-07 (×2): 125 mg via ORAL
  Filled 2017-10-05 (×2): qty 1

## 2017-10-05 MED ORDER — FENTANYL 25 MCG/HR TD PT72
25.0000 ug | MEDICATED_PATCH | TRANSDERMAL | Status: DC
  Administered 2017-10-05: 25 ug via TRANSDERMAL
  Filled 2017-10-05: qty 1

## 2017-10-05 MED ORDER — DOCUSATE SODIUM 100 MG PO CAPS: 100.0000 mg | ORAL_CAPSULE | Freq: Two times a day (BID) | ORAL | Status: DC | PRN

## 2017-10-05 MED ORDER — FENTANYL CITRATE (PF) 100 MCG/2ML IJ SOLN
50.0000 ug | Freq: Once | INTRAMUSCULAR | Status: AC
  Administered 2017-10-05: 50 ug via INTRAVENOUS
  Filled 2017-10-05: qty 2

## 2017-10-05 MED ORDER — CLINDAMYCIN PHOSPHATE 600 MG/50ML IV SOLN
600.0000 mg | INTRAVENOUS | Status: AC
  Filled 2017-10-05: qty 50

## 2017-10-05 MED ORDER — OXYCODONE-ACETAMINOPHEN 5-325 MG PO TABS
1.0000 | ORAL_TABLET | Freq: Once | ORAL | Status: AC
  Administered 2017-10-05: 1 via ORAL
  Filled 2017-10-05: qty 1

## 2017-10-05 MED ORDER — MELATONIN 5 MG PO TABS
1.0000 | ORAL_TABLET | Freq: Every day | ORAL | Status: DC
  Administered 2017-10-06: 5 mg via ORAL
  Filled 2017-10-05 (×2): qty 1

## 2017-10-05 MED ORDER — HYDROCODONE-ACETAMINOPHEN 5-325 MG PO TABS: 1.0000 | ORAL_TABLET | ORAL | Status: DC | PRN

## 2017-10-05 MED ORDER — HEPARIN SODIUM (PORCINE) 5000 UNIT/ML IJ SOLN
5000.0000 [IU] | Freq: Three times a day (TID) | INTRAMUSCULAR | Status: DC
  Administered 2017-10-06 – 2017-10-07 (×3): 5000 [IU] via SUBCUTANEOUS
  Filled 2017-10-05 (×3): qty 1

## 2017-10-05 NOTE — ED Notes (Signed)
Blood redrawn per lab and sent; pt tolerated well; family remains at bedside;

## 2017-10-05 NOTE — ED Notes (Signed)
External female catheter repositioned for comfort;

## 2017-10-05 NOTE — ED Notes (Signed)
Admitting MD at bedside.

## 2017-10-05 NOTE — H&P (Signed)
[facility] NAME: [name]    MR#:  [id number]  DATE OF BIRTH:  05/06/1925  DATE OF ADMISSION:  10/05/2017  PRIMARY CARE PHYSICIAN: Shari Prows, [facility]   REQUESTING/REFERRING PHYSICIAN: Siadecki  CHIEF COMPLAINT:   Chief Complaint  Patient presents with  . Fall  . Hip Pain    HISTORY OF PRESENT ILLNESS: [name]  is a 84 y.o. female with a known history of anxiety, atrial fibrillation, coronary arteriosclerosis, hyperlipidemia, hypertension, multiple myeloma currently not on treatment, deep vein thrombosis, pulmonary embolism recently diagnosed 3 weeks ago- have terminal dementia and living memory care unit, was sent there after being diagnosed with pulmonary embolism 3 weeks ago with oral eliquis.  She is bedbound at her baseline, need complete support to get her up and transfer to the wheelchair. Today she tried to get out of the bed by herself because of her dementia and fell down on the floor, and broke her left hip so brought to the emergency room.  PAST MEDICAL HISTORY:   Past Medical History:  Diagnosis Date  . Anxiety   . Arthritis   . Atrial fibrillation ([place])   . Coronary arteriosclerosis   . DVT (deep venous thrombosis) ([place]) 09/2017  . History of blood transfusion 2014  . Hyperlipidemia   . Hypertension   . Multiple myeloma ([place])   . Multiple myeloma in relapse ([place]) 10/29/2014  . 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 Pulmonary emboli ([place]) 09/2017    PAST SURGICAL HISTORY:  Past Surgical History:  Procedure Laterality Date  . ABDOMINAL HYSTERECTOMY    . APPENDECTOMY    . PARTIAL COLECTOMY    . TOTAL KNEE REVISION Left     SOCIAL HISTORY:  Social History   Tobacco Use  . Smoking status: Never Smoker  . Smokeless tobacco: Never Used  Substance Use Topics  . Alcohol use: Yes    Comment: occasional    FAMILY HISTORY:  Family History  Problem Relation Age of Onset  . Cancer Father    . Heart attack Mother     DRUG ALLERGIES:  Allergies  Allergen Reactions  . Biaxin [Clarithromycin] Diarrhea and Nausea And Vomiting  . Demerol [Meperidine] Nausea And Vomiting  . Hydrocodone Nausea And Vomiting  . Sulfa Antibiotics Hives  . Tramadol Diarrhea and Nausea And Vomiting  . Ultracet [Tramadol-Acetaminophen] Other (See Comments)    REVIEW OF SYSTEMS:   Patient is terminal dementia and cannot give any review of system.  MEDICATIONS AT HOME:  Prior to Admission medications   Medication Sig Start Date End Date Taking? Authorizing Provider  acyclovir (ZOVIRAX) 400 MG tablet TAKE 1 TABLET BY MOUTH ONCE DAILY TO  PREVENT  SHINGLES Patient taking differently: Take 400 mg by mouth at bedtime. TAKE 1 TABLET BY MOUTH ONCE DAILY TO  PREVENT  SHINGLES 04/25/17  Yes Cammie Sickle, MD  apixaban (ELIQUIS) 5 MG TABS tablet Take 10 mg PO BID X 6 days and then take 5 mg PO BID thereafter. Patient taking differently: Take 5 mg by mouth 2 (two) times daily.  09/16/17  Yes Henreitta Leber, MD  divalproex (DEPAKOTE SPRINKLE) 125 MG capsule Take 1 capsule by mouth daily. 08/31/17  Yes [provider]  Docusate Sodium (COLACE PO) Take 2 tablets by mouth at bedtime.    Yes [provider]  megestrol (MEGACE) 40 MG tablet Take 2 tablets (80 mg total) by mouth 2 (  two) times daily. 09/01/17  Yes Cammie Sickle, MD  Melatonin 5 MG TABS Take 1 tablet by mouth daily.   Yes [provider]  mirtazapine (REMERON) 30 MG tablet TAKE 1 TABLET BY MOUTH AT BEDTIME 08/01/17  Yes Cammie Sickle, MD  polyethylene glycol (MIRALAX / GLYCOLAX) packet Take 17 g by mouth daily.    Yes [provider]  risperiDONE (RISPERDAL) 1 MG tablet Take 1 tablet (1 mg total) by mouth at bedtime. 06/19/17  Yes Cammie Sickle, MD  bisacodyl (DULCOLAX) 5 MG EC tablet Take 1 tablet (5 mg total) by mouth daily as needed for moderate constipation. 08/11/17   Epifanio Lesches, MD  fentaNYL (DURAGESIC - DOSED MCG/HR) 25 MCG/HR patch Place 1 patch (25 mcg total) onto the skin every 3 (three) days. 09/16/17   Henreitta Leber, MD  oxyCODONE (OXY IR/ROXICODONE) 5 MG immediate release tablet Take 0.5 tablets (2.5 mg total) by mouth every 6 (six) hours as needed for moderate pain. 09/16/17   Henreitta Leber, MD      PHYSICAL EXAMINATION:   VITAL SIGNS: Blood pressure (!) 178/89, pulse (!) 106, temperature 97.7 F (36.5 C), temperature source Oral, resp. rate 16, height _0  (1.575 m), weight 43.1 kg (95 lb), SpO2 96 %.  GENERAL:  84 y.o.-year-old thin patient lying in the bed with no acute distress.  EYES: Pupils equal, round, reactive to light and accommodation. No scleral icterus. Extraocular muscles intact.  HEENT: Head atraumatic, normocephalic. Oropharynx and nasopharynx clear.  NECK:  Supple, no jugular venous distention. No thyroid enlargement, no tenderness.  LUNGS: Normal breath sounds bilaterally, no wheezing, rales,rhonchi or crepitation. No use of accessory muscles of respiration.  CARDIOVASCULAR: S1, S2 normal. No murmurs, rubs, or gallops.  ABDOMEN: Soft, nontender, nondistended. Bowel sounds present. No organomegaly or mass.  EXTREMITIES: No pedal edema, cyanosis, or clubbing. Left hip pain. NEUROLOGIC: Cranial nerves II through XII are intact. Muscle strength 3/5 in all extremities. Sensation intact. Gait not checked.  PSYCHIATRIC: The patient is alert and oriented x 0.  SKIN: No obvious rash, lesion, or ulcer.   LABORATORY PANEL:   CBC Recent Labs  Lab 10/05/17 1744  WBC 4.8  HGB 12.8  HCT 37.6  PLT 285  MCV 89.5  MCH 30.5  MCHC 34.0  RDW 15.5*  LYMPHSABS 1.5  MONOABS 0.7  EOSABS 0.1  BASOSABS 0.0   ------------------------------------------------------------------------------------------------------------------  Chemistries  Recent Labs  Lab 10/05/17 1744  NA 137  K 4.3  CL 106  CO2 23  GLUCOSE 87  BUN 19   CREATININE 0.59  CALCIUM 8.4*   ------------------------------------------------------------------------------------------------------------------ estimated creatinine clearance is 31.8 mL/min (by C-G formula based on SCr of 0.59 mg/dL). ------------------------------------------------------------------------------------------------------------------ No results for input(s): TSH, T4TOTAL, T3FREE, THYROIDAB in the last 72 hours.  Invalid input(s): FREET3   Coagulation profile Recent Labs  Lab 10/05/17 1927  INR 1.03   ------------------------------------------------------------------------------------------------------------------- No results for input(s): DDIMER in the last 72 hours. -------------------------------------------------------------------------------------------------------------------  Cardiac Enzymes No results for input(s): CKMB, TROPONINI, MYOGLOBIN in the last 168 hours.  Invalid input(s): CK ------------------------------------------------------------------------------------------------------------------ Invalid input(s): POCBNP  ---------------------------------------------------------------------------------------------------------------  Urinalysis    Component Value Date/Time   COLORURINE YELLOW (A) 09/13/2017 1927   APPEARANCEUR HAZY (A) 09/13/2017 1927   APPEARANCEUR Clear 04/02/2012 0015   LABSPEC 1.025 09/13/2017 1927   LABSPEC 1.011 04/02/2012 0015   PHURINE 6.0 09/13/2017 1927   GLUCOSEU NEGATIVE 09/13/2017 1927   GLUCOSEU Negative 04/02/2012 0015   HGBUR NEGATIVE 09/13/2017  [place] 09/13/2017 1927   BILIRUBINUR Negative 04/02/2012 0015   KETONESUR 5 (A) 09/13/2017 1927   PROTEINUR 30 (A) 09/13/2017 1927   NITRITE NEGATIVE 09/13/2017 1927   LEUKOCYTESUR NEGATIVE 09/13/2017 1927   LEUKOCYTESUR Negative 04/02/2012 0015     RADIOLOGY: Dg Hip Unilat W Or Wo Pelvis 2-3 Views Left  Result Date: 10/05/2017 CLINICAL  DATA:  Pt had witnessed fall by staff at [facility]. Pt presents w/ c/o L hip pain. Hx - multiple myeloma. EXAM: DG HIP (WITH OR WITHOUT PELVIS) 2-3V LEFT COMPARISON:  PET-CT, 12/13/2016. FINDINGS: Acute nondisplaced, nonangulated fracture across the mid left femoral neck. No other acute fractures. There are old sacral and right parasymphysis fractures, healed. Bones are demineralized. Hip joints, SI joints and symphysis pubis are normally aligned. IMPRESSION: 1. Nondisplaced, nonangulated fracture of the mid left femoral neck. Electronically Signed   By: Lajean Manes M.D.   On: 10/05/2017 18:31    EKG: Orders placed or performed during the hospital encounter of 10/05/17  . ED EKG  . ED EKG    IMPRESSION AND PLAN:  * left hip fracture   Patient have multiple myeloma, orthopedic consult to decide if she is a candidate for surgical intervention.   She was on anticoagulation because of her recent diagnosis of pulmonary embolism and DVT.   I was currently put her on DVT prophylaxis and stop oral anticoagulation in anticipation of surgery in next 1-2 days. Last dose was today morning.  * chronic dementia   She lives in a memory care unit, at baseline she is bedbound and needed help to transfer from bed to wheelchair and from wheelchair to the bathroom, due to this she may not be a good candidate for rehabilitation as there is no chances of improvement in her overall condition. She may be able to go back to her facility if they can provide further care.  * multiple myeloma   Currently not on chemotherapy as she could not tolerate it in the recent past.   Prognosis overall very poor, will call palliative care consult.  * recent DVT and PE   . Oral anticoagulation in anticipation of a requirement of surgery, keep on DVT prophylaxis for now.  All the records are reviewed and case discussed with ED provider. Management plans discussed with the patient, family and they are in agreement.  CODE  STATUS:DO NOT RESUSCITATE. Code Status History    Date Active Date Inactive Code Status Order ID Comments User Context   09/13/2017 2207 09/16/2017 2209 DNR [id number]  Nicholes Mango, MD ED   08/07/2017 2258 08/11/2017 1921 DNR [id number]  Amelia Jo, MD Inpatient   08/05/2017 1300 08/06/2017 1735 DNR [id number]  Marga Hoots, RN Inpatient   08/04/2017 2317 08/05/2017 1300 Full Code [id number]  Lance Coon, MD Inpatient    Questions for Most Recent Historical Code Status (Order [id number])    Question Answer Comment   In the event of cardiac or respiratory ARREST Do not call a "code blue"    In the event of cardiac or respiratory ARREST Do not perform Intubation, CPR, defibrillation or ACLS    In the event of cardiac or respiratory ARREST Use medication by any route, position, wound care, and other measures to relive pain and suffering. May use oxygen, suction and manual treatment of airway obstruction as needed for comfort.    Comments rn may pronounce         Advance Directive Documentation  Most Recent Value  Type of Advance Directive  Out of facility DNR (pink MOST or yellow form)  Pre-existing out of facility DNR order (yellow form or pink MOST form)  -  "MOST" Form in Place?  -      I spoke to patient's son in the room in detail about her condition, overall very poor prognosis, the plan for admission and further care in hospital.  TOTAL TIME TAKING CARE OF THIS PATIENT: 50 minutes.    Vaughan Basta M.D on 10/05/2017   Between 7am to 6pm - Pager - [phone]  After 6pm go to [email or web address] - password [email or web address] [facility]  Office  [phone]  CC: Primary care physician; [facility]   Note: This dictation was prepared with Dragon dictation along with smaller phrase technology. Any transcriptional errors that result from this process are unintentional.

## 2017-10-05 NOTE — ED Notes (Signed)
Family has decided to leave and wait to go with pt to floor for admission; says they just talked with admitting MD and need to leave as son has to get up at 53 to drive a school bus

## 2017-10-05 NOTE — ED Notes (Signed)
Attempted to reach personnel at [facility], no one answered the phone at the nurse's station after spending 20 minutes letting the phone ring. Pt has no med list and am unable to verify her medications at home at this time.

## 2017-10-05 NOTE — ED Notes (Signed)
[name], [place] requesting staff call [name]'s cell phone first for any needs on Monday 10/06/17-cell phone [phone]

## 2017-10-05 NOTE — ED Notes (Addendum)
Son and daughter in law remain at bedside; son stepped out to reposition external female catheter; family says pt continues to say she needs to void but doesn't want to wet the bed; pt cannot understand that it is okay for her to just bear down to void; family wondering if foley would benefit pt; encouraged family to discuss with admitting MD; family also having concerns about hip surgery and pt's outcome; says she's non-ambulatory anyway and they are concerned she won't do well following procedure; encouraged family to discuss this with admitting MD as well; family wanting to leave-requested they stay for admission as pt is unable to answer questions regarding history; verbalized understanding and will remain with pt until admission

## 2017-10-05 NOTE — ED Notes (Signed)
Pt noted to be laying sideways on the bed, right leg through the rails; has pulled up her gown and removed the external cath; chux underneath dry; pt repositioned;

## 2017-10-05 NOTE — ED Notes (Signed)
External female catheter applied, low constant suction at wall.

## 2017-10-05 NOTE — ED Provider Notes (Signed)
[facility] Emergency Department Provider Note ____________________________________________   First MD Initiated Contact with Patient 10/05/17 1727     (approximate)  I have reviewed the triage vital signs and the nursing notes.   HISTORY  Chief Complaint Fall and Hip Pain  Level 5 caveat: History of present illness limited due to dementia  HPI [name] is a 84 y.o. female who presents with left hip pain, acute onset after a fall apparently from standing height which was unwitnessed.  Patient reports pain to the left hip but denies other injuries.  Past Medical History:  Diagnosis Date  . Anxiety   . Arthritis   . Atrial fibrillation ([place])   . Coronary arteriosclerosis   . History of blood transfusion 2014  . Hyperlipidemia   . Hypertension   . Multiple myeloma ([place])   . Multiple myeloma in relapse ([place]) 10/29/2014  . 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 Protein-calorie malnutrition, severe 09/16/2017  . Acute pulmonary embolism without acute cor pulmonale (HCC)   . Acute deep vein thrombosis (DVT) of distal vein of left lower extremity ([place])   . Chest pain 09/13/2017  . HCAP (healthcare-associated pneumonia) 08/07/2017  . Left leg weakness 08/04/2017  . Dehydration 02/19/2017  . Pain in right hip 12/28/2015  . Pain in lower back 12/28/2015  . Rheumatoid arthritis ([place]) 03/29/2015  . Inflammatory polyarthropathy ([place]) 03/29/2015  . Multiple myeloma not having achieved remission ([place]) 10/29/2014  . Absolute anemia 10/24/2014  . CAD in native artery 10/24/2014  . Benign essential HTN 10/24/2014  . MI (mitral incompetence) 05/25/2014  . AF (paroxysmal atrial fibrillation) ([place]) 05/25/2014  . Paroxysmal atrial fibrillation ([place]) 05/25/2014    Past Surgical History:  Procedure Laterality Date  . ABDOMINAL HYSTERECTOMY    . APPENDECTOMY    . PARTIAL COLECTOMY    . TOTAL  KNEE REVISION Left     Prior to Admission medications   Medication Sig Start Date End Date Taking? Authorizing Provider  acyclovir (ZOVIRAX) 400 MG tablet TAKE 1 TABLET BY MOUTH ONCE DAILY TO  PREVENT  SHINGLES Patient taking differently: Take 400 mg by mouth at bedtime. TAKE 1 TABLET BY MOUTH ONCE DAILY TO  PREVENT  SHINGLES 04/25/17  Yes Cammie Sickle, MD  apixaban (ELIQUIS) 5 MG TABS tablet Take 10 mg PO BID X 6 days and then take 5 mg PO BID thereafter. Patient taking differently: Take 5 mg by mouth 2 (two) times daily.  09/16/17  Yes Henreitta Leber, MD  divalproex (DEPAKOTE SPRINKLE) 125 MG capsule Take 1 capsule by mouth daily. 08/31/17  Yes [provider]  Docusate Sodium (COLACE PO) Take 2 tablets by mouth at bedtime.    Yes [provider]  megestrol (MEGACE) 40 MG tablet Take 2 tablets (80 mg total) by mouth 2 (two) times daily. 09/01/17  Yes Cammie Sickle, MD  Melatonin 5 MG TABS Take 1 tablet by mouth daily.   Yes [provider]  mirtazapine (REMERON) 30 MG tablet TAKE 1 TABLET BY MOUTH AT BEDTIME 08/01/17  Yes Cammie Sickle, MD  polyethylene glycol (MIRALAX / GLYCOLAX) packet Take 17 g by mouth daily.    Yes [provider]  risperiDONE (RISPERDAL) 1 MG tablet Take 1 tablet (1 mg total) by mouth at bedtime. 06/19/17  Yes Cammie Sickle, MD  bisacodyl (DULCOLAX) 5 MG EC tablet Take 1  tablet (5 mg total) by mouth daily as needed for moderate constipation. 08/11/17   Epifanio Lesches, MD  fentaNYL (DURAGESIC - DOSED MCG/HR) 25 MCG/HR patch Place 1 patch (25 mcg total) onto the skin every 3 (three) days. 09/16/17   Henreitta Leber, MD  oxyCODONE (OXY IR/ROXICODONE) 5 MG immediate release tablet Take 0.5 tablets (2.5 mg total) by mouth every 6 (six) hours as needed for moderate pain. 09/16/17   Henreitta Leber, MD    Allergies Biaxin [clarithromycin]; Demerol [meperidine]; Hydrocodone; Sulfa antibiotics; Tramadol; and  Ultracet [tramadol-acetaminophen]  Family History  Problem Relation Age of Onset  . Cancer Father   . Heart attack Mother     Social History Social History   Tobacco Use  . Smoking status: Never Smoker  . Smokeless tobacco: Never Used  Substance Use Topics  . Alcohol use: Yes    Comment: occasional  . Drug use: No    Review of Systems Level 5 caveat: Unable to obtain review of systems due to dementia   ____________________________________________   PHYSICAL EXAM:  VITAL SIGNS: ED Triage Vitals  Enc Vitals Group     BP --      Pulse Rate 10/05/17 1728 86     Resp 10/05/17 1728 (!) 23     Temp 10/05/17 1728 97.7 F (36.5 C)     Temp Source 10/05/17 1728 Oral     SpO2 10/05/17 1728 97 %     Weight 10/05/17 1730 95 lb (43.1 kg)     Height 10/05/17 1730 _0  (1.575 m)     Head Circumference --      Peak Flow --      Pain Score --      Pain Loc --      Pain Edu? --      Excl. in [place]? --     Constitutional: Alert, not oriented.  Relatively comfortable appearing and in no acute distress. Eyes: Conjunctivae are normal.  EOMI.  PERRLA. Head: Atraumatic. Nose: No congestion/rhinnorhea. Mouth/Throat: Mucous membranes are moist.   Neck: Normal range of motion.  No midline cervical spinal tenderness. Cardiovascular: Good peripheral circulation. Respiratory: Normal respiratory effort.  Gastrointestinal: Soft and nontender.  Genitourinary: No flank tenderness. Musculoskeletal: Pain on ROM of left hip.  No deformity or shortening.  2+ DP pulses bilaterally.  No midline spinal tenderness.  No deformity or pain to other joints. Neurologic: Motor intact in all extremities.  Normal speech. Skin:  Skin is warm and dry. No rash noted. Psychiatric: Calm and cooperative.   ____________________________________________   LABS (all labs ordered are listed, but only abnormal results are displayed)  Labs Reviewed  BASIC METABOLIC PANEL - Abnormal; Notable for the following  components:      Result Value   Calcium 8.4 (*)    All other components within normal limits  CBC WITH DIFFERENTIAL/PLATELET - Abnormal; Notable for the following components:   RDW 15.5 (*)    All other components within normal limits  URINALYSIS, ROUTINE W REFLEX MICROSCOPIC  PROTIME-INR  TYPE AND SCREEN  TYPE AND SCREEN   ____________________________________________  EKG  ED ECG REPORT I, Arta Silence, the attending physician, personally viewed and interpreted this ECG.  Date: 10/05/2017 EKG Time: 1723 Rate: 93 Rhythm: normal sinus rhythm QRS Axis: normal Intervals: LAFB ST/T Wave abnormalities: Nonspecific inferior ST abnormalities Narrative Interpretation: no evidence of acute ischemia; normalization of previously inverted T waves in inferior and  lateral leads when compared to EKG of 09/17/2017  ____________________________________________  RADIOLOGY  X-ray left hip: Nondisplaced left femoral neck fracture  ____________________________________________   PROCEDURES  Procedure(s) performed: No  Procedures  Critical Care performed: No ____________________________________________   INITIAL IMPRESSION / ASSESSMENT AND PLAN / ED COURSE  Pertinent labs & imaging results that were available during my care of the patient were reviewed by me and considered in my medical decision making (see chart for details).  84 year old female with PMH as noted above presents with left hip pain after apparent fall from standing height which was unwitnessed.  She denies pain elsewhere.  Exam is as described above with pain on ROM of left hip but no other apparent injuries.  Plan: X-ray to evaluate for hip fracture and trial of ambulation if negative.  There is no evidence of weakness, syncope, or other acute medical etiology, so no indication for labs at this time.    ----------------------------------------- 6:52 PM on  10/05/2017 -----------------------------------------  X-ray shows femoral neck fracture.  I consulted Dr. Sabra Heck from orthopedics who states patient would likely be a candidate for surgery tomorrow after her Eliquis wears off.  I discussed the results of work-up with the patient's family members.  Labs are pending.  I will admit the patient to the hospitalist.  ----------------------------------------- 7:44 PM on 10/05/2017 -----------------------------------------  I had further extensive discussion with the patient's family members.  Due to her history of multiple myeloma, her overall poor functional status, and her difficulty with ambulation at baseline, she may not be an ideal surgical candidate.  I discussed this again with Dr. Sabra Heck, who states that she may benefit from percutaneous pinning or other less invasive option.  He will discuss this with the family tomorrow morning.  Patient's pain is well controlled at this time.  Lab work-up is unremarkable.  I signed the patient out for admission to the hospitalist Dr. Anselm Jungling.  ____________________________________________   FINAL CLINICAL IMPRESSION(S) / ED DIAGNOSES  Final diagnoses:  Closed fracture of neck of left femur, initial encounter ([place])      NEW MEDICATIONS STARTED DURING THIS VISIT:  New Prescriptions   No medications on file     Note:  This document was prepared using Dragon voice recognition software and may include unintentional dictation errors.    Arta Silence, MD 10/05/17 1945

## 2017-10-05 NOTE — ED Notes (Signed)
Son out to desk with pt's med list; introduced self to son as pt's nurse; copied meds and allergies and given to pharmacy tech;

## 2017-10-05 NOTE — ED Notes (Signed)
Pt resting quietly in bed, waiting for admission room

## 2017-10-05 NOTE — ED Triage Notes (Signed)
Pt had witnessed fall by staff at [facility]. Pt presents w/ c/o L hip pain.

## 2017-10-05 NOTE — ED Notes (Signed)
Wrap applied to patient's R hand/forearm to prevent her from pulling out her IV. CMS intact after application of wrap.

## 2017-10-06 ENCOUNTER — Encounter: Payer: Self-pay | Admitting: Anesthesiology

## 2017-10-06 ENCOUNTER — Telehealth: Payer: Self-pay | Admitting: Internal Medicine

## 2017-10-06 ENCOUNTER — Inpatient Hospital Stay: Payer: Medicare Other | Admitting: Anesthesiology

## 2017-10-06 ENCOUNTER — Encounter
Admission: EM | Disposition: A | Discharge status: Hospice | Payer: Self-pay | Source: Home / Self Care | Attending: Internal Medicine

## 2017-10-06 LAB — URINALYSIS, ROUTINE W REFLEX MICROSCOPIC
BILIRUBIN URINE: NEGATIVE
GLUCOSE, UA: NEGATIVE mg/dL
Ketones, ur: 5 mg/dL — AB
Leukocytes, UA: NEGATIVE
NITRITE: NEGATIVE
Protein, ur: NEGATIVE mg/dL
SPECIFIC GRAVITY, URINE: 1.013 (ref 1.005–1.030)
Squamous Epithelial / LPF: NONE SEEN (ref 0–5)
pH: 6 (ref 5.0–8.0)

## 2017-10-06 LAB — SURGICAL PCR SCREEN
MRSA, PCR: NEGATIVE
STAPHYLOCOCCUS AUREUS: NEGATIVE

## 2017-10-06 LAB — BASIC METABOLIC PANEL
Anion gap: 6 (ref 5–15)
BUN: 13 mg/dL (ref 6–20)
CALCIUM: 8.1 mg/dL — AB (ref 8.9–10.3)
CO2: 23 mmol/L (ref 22–32)
CREATININE: 0.55 mg/dL (ref 0.44–1.00)
Chloride: 105 mmol/L (ref 101–111)
GFR calc Af Amer: >60 mL/min (ref >60)
GLUCOSE: 107 mg/dL — AB (ref 65–99)
Potassium: 3.9 mmol/L (ref 3.5–5.1)
Sodium: 134 mmol/L — ABNORMAL LOW (ref 135–145)

## 2017-10-06 LAB — CBC
HEMATOCRIT: 37.1 % (ref 35.0–47.0)
Hemoglobin: 12.6 g/dL (ref 12.0–16.0)
MCH: 30.4 pg (ref 26.0–34.0)
MCHC: 33.9 g/dL (ref 32.0–36.0)
MCV: 89.5 fL (ref 80.0–100.0)
PLATELETS: 277 10*3/uL (ref 150–440)
RBC: 4.14 MIL/uL (ref 3.80–5.20)
RDW: 15.4 % — AB (ref 11.5–14.5)
WBC: 6.8 10*3/uL (ref 3.6–11.0)

## 2017-10-06 SURGERY — FIXATION, FEMUR, NECK, PERCUTANEOUS, USING SCREW
Anesthesia: General | Laterality: Left

## 2017-10-06 SURGICAL SUPPLY — 21 items
BAG COUNTER SPONGE EZ (MISCELLANEOUS) ×2 IMPLANT
BLADE SURG SZ11 CARB STEEL (BLADE) ×3 IMPLANT
BNDG COHESIVE 4X5 TAN STRL (GAUZE/BANDAGES/DRESSINGS) ×3 IMPLANT
CHLORAPREP W/TINT 26ML (MISCELLANEOUS) ×3 IMPLANT
COUNTER SPONGE BAG EZ (MISCELLANEOUS) ×1
DRSG AQUACEL AG ADV 3.5X10 (GAUZE/BANDAGES/DRESSINGS) ×3 IMPLANT
GAUZE SPONGE 4X4 12PLY STRL (GAUZE/BANDAGES/DRESSINGS) ×3 IMPLANT
GLOVE BIO SURGEON STRL SZ8 (GLOVE) ×3 IMPLANT
GLOVE SURG ORTHO 8.5 STRL (GLOVE) ×3 IMPLANT
GLOVE SURG XRAY 8.5 LX (GLOVE) ×3 IMPLANT
GOWN STRL REUS W/ TWL LRG LVL3 (GOWN DISPOSABLE) ×1 IMPLANT
GOWN STRL REUS W/TWL LRG LVL3 (GOWN DISPOSABLE) ×2
GOWN STRL REUS W/TWL LRG LVL4 (GOWN DISPOSABLE) ×3 IMPLANT
KIT TURNOVER KIT A (KITS) ×3 IMPLANT
MAT BLUE FLOOR 46X72 FLO (MISCELLANEOUS) ×3 IMPLANT
NEEDLE SPNL 18GX3.5 QUINCKE PK (NEEDLE) ×3 IMPLANT
NS IRRIG 500ML POUR BTL (IV SOLUTION) ×3 IMPLANT
PACK HIP COMPR (MISCELLANEOUS) ×3 IMPLANT
STRAP SAFETY 5IN WIDE (MISCELLANEOUS) ×3 IMPLANT
SUT ETHILON 3 0 FSLX (SUTURE) ×3 IMPLANT
SYR 30ML LL (SYRINGE) ×3 IMPLANT

## 2017-10-06 NOTE — NC FL2 (Signed)
[place] LEVEL OF CARE SCREENING TOOL     IDENTIFICATION  Patient Name: [name] Birthdate: 12-Mar-1925 Sex: female Admission Date (Current Location): 10/05/2017  [place] and [place] Number:  Engineering geologist and Address:  [facility], [address], [place], [place] [address]      Provider Number: [id number]  Attending Physician Name and Address:  Max Sane, MD  Relative Name and Phone Number:       Current Level of Care: Hospital Recommended Level of Care: [place], Memory Care Prior Approval Number:    Date Approved/Denied:   PASRR Number: ([id number])  Discharge Plan: Domiciliary (Rest home)(Memory Care )    Current Diagnoses: Patient Active Problem List   Diagnosis Date Noted  . Hip fracture ([place]) 10/05/2017  . Protein-calorie malnutrition, severe 09/16/2017  . Acute pulmonary embolism without acute cor pulmonale (HCC)   . Acute deep vein thrombosis (DVT) of distal vein of left lower extremity ([place])   . Chest pain 09/13/2017  . HCAP (healthcare-associated pneumonia) 08/07/2017  . Left leg weakness 08/04/2017  . Dehydration 02/19/2017  . Pain in right hip 12/28/2015  . Pain in lower back 12/28/2015  . Rheumatoid arthritis ([place]) 03/29/2015  . Inflammatory polyarthropathy ([place]) 03/29/2015  . Multiple myeloma not having achieved remission ([place]) 10/29/2014  . Absolute anemia 10/24/2014  . CAD in native artery 10/24/2014  . Benign essential HTN 10/24/2014  . MI (mitral incompetence) 05/25/2014  . AF (paroxysmal atrial fibrillation) ([place]) 05/25/2014  . Paroxysmal atrial fibrillation (HCC) 05/25/2014    Orientation RESPIRATION BLADDER Height & Weight     Self  Normal Continent Weight: 95 lb (43.1 kg) Height:  _0  (157.5 cm)  BEHAVIORAL SYMPTOMS/MOOD NEUROLOGICAL BOWEL NUTRITION STATUS      Continent Diet(Diet: Heart Healthy )  AMBULATORY STATUS COMMUNICATION OF NEEDS Skin   Extensive  Assist Verbally Normal                       Personal Care Assistance Level of Assistance  Bathing, Feeding, Dressing Bathing Assistance: Limited assistance Feeding assistance: Limited assistance Dressing Assistance: Limited assistance     Functional Limitations Info  Sight, Hearing, Speech Sight Info: Adequate Hearing Info: Adequate Speech Info: Adequate    SPECIAL CARE FACTORS FREQUENCY                       Contractures      Additional Factors Info  Code Status, Allergies Code Status Info: (DNR ) Allergies Info: (Biaxin Clarithromycin, Demerol Meperidine, Hydrocodone, Sulfa Antibiotics, Tramadol, Ultracet Tramadol-acetaminophen)           Current Medications (10/06/2017):  This is the current hospital active medication list Current Facility-Administered Medications  Medication Dose Route Frequency Provider Last Rate Last Dose  . 0.9 %  sodium chloride infusion   Intravenous Continuous Earnestine Leys, MD 75 mL/hr at 10/06/17 [phone]    . bisacodyl (DULCOLAX) EC tablet 5 mg  5 mg Oral Daily PRN Vaughan Basta, MD      . ceFAZolin (ANCEF) IVPB 2g/100 mL premix  2 g Intravenous On Call to OR Earnestine Leys, MD      . clindamycin (CLEOCIN) IVPB 600 mg  600 mg Intravenous On Call to OR Earnestine Leys, MD      . divalproex (DEPAKOTE SPRINKLE) capsule 125 mg  125 mg Oral Daily Vaughan Basta, MD   125 mg at 10/06/17 0937  . docusate sodium (COLACE)  capsule 100 mg  100 mg Oral BID Vaughan Basta, MD      . fentaNYL ([place] - dosed mcg/hr) patch 25 mcg  25 mcg Transdermal Q72H Vaughan Basta, MD   25 mcg at 10/05/17 2351  . heparin injection 5,000 Units  5,000 Units Subcutaneous Q8H Vaughan Basta, MD      . HYDROcodone-acetaminophen (NORCO/VICODIN) 5-325 MG per tablet 1 tablet  1 tablet Oral Q4H PRN Earnestine Leys, MD      . megestrol (MEGACE) tablet 80 mg  80 mg Oral BID Vaughan Basta, MD      . Melatonin TABS 5 mg  1  tablet Oral Daily Vaughan Basta, MD      . mirtazapine (REMERON) tablet 30 mg  30 mg Oral QHS Vaughan Basta, MD      . morphine 2 MG/ML injection 1 mg  1 mg Intravenous Q2H PRN Earnestine Leys, MD   1 mg at 10/06/17 0956  . oxyCODONE-acetaminophen (PERCOCET/ROXICET) 5-325 MG per tablet 1 tablet  1 tablet Oral Q6H PRN Vaughan Basta, MD      . polyethylene glycol (MIRALAX / GLYCOLAX) packet 17 g  17 g Oral Daily Vaughan Basta, MD      . risperiDONE (RISPERDAL) tablet 1 mg  1 mg Oral QHS Vaughan Basta, MD       Facility-Administered Medications Ordered in Other Encounters  Medication Dose Route Frequency Provider Last Rate Last Dose  . sodium chloride 0.9 % injection 10 mL  10 mL Intravenous PRN Forest Gleason, MD   10 mL at 03/01/15 1305     Discharge Medications: Please see discharge summary for a list of discharge medications.  Relevant Imaging Results:  Relevant Lab Results:   Additional Information (SSN: [id number])  Alexsandro Salek, Veronia Beets, LCSW

## 2017-10-06 NOTE — Consult Note (Signed)
ORTHOPAEDIC CONSULTATION  REQUESTING PHYSICIAN: Max Sane, MD  Chief Complaint: Left hip pain  HPI: [name] is a 84 y.o. female who complains of left hip pain following a fall at her [facility] memory care facility.  Patient was diagnosed with extensive DVT in the left leg with bilateral pulmonary emboli 3 weeks ago.  She was started on Eliquis at that time.  He has advanced dementia, multiple myeloma, atrial fibrillation, hypertension, hyperlipidemia, and coronary arterial sclerosis.  Dr. Jeralyn Ruths her hospitalist and I discussed treatment options with the patient's family.  She is extremely high risk patient due to all these medical problems.  She is essentially bedbound at baseline as well.  The option of surgery with percutaneous pinning was discussed versus conservative treatment.  I advised them that the fracture might propagate and have increased pain at a later date.  Currently the fracture is nondisplaced.  At this time the patient family has decided to proceed with nonoperative treatment.  Hospice and palliative care will be brought in. I advised the family that I concurred with this decision as being a very reasonable one.  Past Medical History:  Diagnosis Date  . Anxiety   . Arthritis   . Atrial fibrillation ([place])   . Coronary arteriosclerosis   . DVT (deep venous thrombosis) ([place]) 09/2017  . History of blood transfusion 2014  . Hyperlipidemia   . Hypertension   . Multiple myeloma ([place])   . Multiple myeloma in relapse ([place]) 10/29/2014  . 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 Pulmonary emboli ([place]) 09/2017   Past Surgical History:  Procedure Laterality Date  . ABDOMINAL HYSTERECTOMY    . APPENDECTOMY    . PARTIAL COLECTOMY    . TOTAL KNEE REVISION Left    Social History   Socioeconomic History  . Marital status: Widowed    Spouse name: Not on file  . Number of children: Not on file  . Years of education: Not on file  . Highest education level: Not on  file  Occupational History  . Not on file  Social Needs  . Financial resource strain: Not very hard  . Food insecurity:    Worry: Patient refused    Inability: Patient refused  . Transportation needs:    Medical: Patient refused    Non-medical: Patient refused  Tobacco Use  . Smoking status: Never Smoker  . Smokeless tobacco: Never Used  Substance and Sexual Activity  . Alcohol use: Yes    Comment: occasional  . Drug use: No  . Sexual activity: Never  Lifestyle  . Physical activity:    Days per week: Patient refused    Minutes per session: Patient refused  . Stress: Only a little  Relationships  . Social connections:    Talks on phone: Patient refused    Gets together: Patient refused    Attends religious service: Patient refused    Active member of club or organization: Patient refused    Attends meetings of clubs or organizations: Patient refused    Relationship status: Patient refused  Other Topics Concern  . Not on file  Social History Narrative  . Not on file   Family History  Problem Relation Age of Onset  . Cancer Father   . Heart attack Mother    Allergies  Allergen Reactions  . Biaxin [Clarithromycin] Diarrhea and Nausea And Vomiting  . Demerol [Meperidine] Nausea And Vomiting  . Hydrocodone Nausea And Vomiting  . Sulfa Antibiotics Hives  .  Tramadol Diarrhea and Nausea And Vomiting  . Ultracet [Tramadol-Acetaminophen] Other (See Comments)   Prior to Admission medications   Medication Sig Start Date End Date Taking? Authorizing Provider  acyclovir (ZOVIRAX) 400 MG tablet TAKE 1 TABLET BY MOUTH ONCE DAILY TO  PREVENT  SHINGLES Patient taking differently: Take 400 mg by mouth at bedtime. TAKE 1 TABLET BY MOUTH ONCE DAILY TO  PREVENT  SHINGLES 04/25/17  Yes Cammie Sickle, MD  apixaban (ELIQUIS) 5 MG TABS tablet Take 10 mg PO BID X 6 days and then take 5 mg PO BID thereafter. Patient taking differently: Take 5 mg by mouth 2 (two) times daily.   09/16/17  Yes Henreitta Leber, MD  divalproex (DEPAKOTE SPRINKLE) 125 MG capsule Take 1 capsule by mouth daily. 08/31/17  Yes [provider]  Docusate Sodium (COLACE PO) Take 2 tablets by mouth at bedtime.    Yes [provider]  megestrol (MEGACE) 40 MG tablet Take 2 tablets (80 mg total) by mouth 2 (two) times daily. 09/01/17  Yes Cammie Sickle, MD  Melatonin 5 MG TABS Take 1 tablet by mouth daily.   Yes [provider]  mirtazapine (REMERON) 30 MG tablet TAKE 1 TABLET BY MOUTH AT BEDTIME 08/01/17  Yes Cammie Sickle, MD  polyethylene glycol (MIRALAX / GLYCOLAX) packet Take 17 g by mouth daily.    Yes [provider]  risperiDONE (RISPERDAL) 1 MG tablet Take 1 tablet (1 mg total) by mouth at bedtime. 06/19/17  Yes Cammie Sickle, MD  bisacodyl (DULCOLAX) 5 MG EC tablet Take 1 tablet (5 mg total) by mouth daily as needed for moderate constipation. 08/11/17   Epifanio Lesches, MD  fentaNYL (DURAGESIC - DOSED MCG/HR) 25 MCG/HR patch Place 1 patch (25 mcg total) onto the skin every 3 (three) days. 09/16/17   Henreitta Leber, MD  oxyCODONE (OXY IR/ROXICODONE) 5 MG immediate release tablet Take 0.5 tablets (2.5 mg total) by mouth every 6 (six) hours as needed for moderate pain. 09/16/17   Henreitta Leber, MD   Dg Hip Unilat W Or Wo Pelvis 2-3 Views Left  Result Date: 10/05/2017 CLINICAL DATA:  Pt had witnessed fall by staff at [facility]. Pt presents w/ c/o L hip pain. Hx - multiple myeloma. EXAM: DG HIP (WITH OR WITHOUT PELVIS) 2-3V LEFT COMPARISON:  PET-CT, 12/13/2016. FINDINGS: Acute nondisplaced, nonangulated fracture across the mid left femoral neck. No other acute fractures. There are old sacral and right parasymphysis fractures, healed. Bones are demineralized. Hip joints, SI joints and symphysis pubis are normally aligned. IMPRESSION: 1. Nondisplaced, nonangulated fracture of the mid left femoral neck. Electronically Signed   By: Lajean Manes  M.D.   On: 10/05/2017 18:31    Positive ROS: All other systems have been reviewed and were otherwise negative with the exception of those mentioned in the HPI and as above.  Physical Exam: General: Alert, no acute distress Cardiovascular: No pedal edema Respiratory: No cyanosis, no use of accessory musculature GI: No organomegaly, abdomen is soft and non-tender Skin: No lesions in the area of chief complaint Neurologic: Sensation intact distally Psychiatric: Patient is competent for consent with normal mood and affect Lymphatic: No axillary or cervical lymphadenopathy  MUSCULOSKELETAL: Patient is lying in bed very somnolent.  She does arouse to verbal cues.  She is not current on time or place but does know that her left leg hurts.  Left leg is not short rotated.  Status good distally.  There is pain  with movement of the left hip.  Assessment: Nondisplaced left subcapital hip fracture in high risk elderly patient.  Plan: In keeping with the family's wishes we will proceed with nonoperative treatment.  Will be glad to revisit issues at a later date if they wish.    Park Breed, MD [phone]   10/06/2017 4:56 PM

## 2017-10-06 NOTE — Telephone Encounter (Signed)
Met with the patient's son by the bedside-discussed at length given poor prognosis multiple comorbidities recommend hospice.  In agreement.

## 2017-10-06 NOTE — Progress Notes (Signed)
Family Meeting Note  Advance Directive:yes  Today a meeting took place with the Patient and Son (HC-POA) & daughter in law at bedside.  The following clinical team members were present during this meeting:MD  The following were discussed:Patient's diagnosis:   84 year old woman with a known history of dementia, multiple myeloma, HLD, HTN, A fib and DVT admitted to [facility] from [facility] care following a fall. She has a nondisplaced, nonangulated fracture of the mid left femoral neck. She is not a surgical candidate and family wishes to focus on comfort with transfer to the hospice home tomorrow if beds available.  Patient's progosis: < 2 weeks and Goals for treatment: DNR  Additional follow-up to be provided: Comfort care, Hospice Home tomorrow if bed available  Time spent during discussion:20 minutes  Max Sane, MD

## 2017-10-06 NOTE — Progress Notes (Signed)
NO CHARGE  Discussed with Dr. Manuella Ghazi, at this time patient's family making a decision regarding residential hospice after their discussion. No need to see at this time.   Appreciate consult please call if further assistance is needed.   Alda Lea, NP-BC Palliative Medicine Team  Phone: [phone] Fax: [phone] Pager: [phone]

## 2017-10-06 NOTE — Plan of Care (Signed)
  Problem: Education: Goal: Knowledge of General Education information will improve Outcome: Not Progressing   

## 2017-10-06 NOTE — Clinical Social Work Note (Signed)
Clinical Social Work Assessment  Patient Details  Name: [name] MRN: [id number] Date of Birth: 1924-11-01  Date of referral:  10/06/17               Reason for consult:  Other (Comment Required)(From [facility])                Permission sought to share information with:  Facility Art therapist granted to share information::  Yes, Verbal Permission Granted  Name::      [name]::     Relationship::     Contact Information:     Housing/Transportation Living arrangements for the past 2 months:  [place] of Information:  Facility, Other (Comment Required)(Daughter in law ) Patient Interpreter Needed:  None Criminal Activity/Legal Involvement Pertinent to Current Situation/Hospitalization:  No - Comment as needed Significant Relationships:  Adult Children Lives with:  Facility Resident Do you feel safe going back to the place where you live?  Yes Need for family participation in patient care:  Yes (Comment)  Care giving concerns:  Patient is a resident at [facility] in [place].    Social Worker assessment / plan:  Holiday representative (CSW) reviewed chart and noted that patient is from [facility]. Per nurse in progression rounds this morning family is meeting with Ortho surgeon and palliative care to discuss moving forward with surgery or not. CSW met with patient and her daughter in law [name] [phone] was at bedside. Patient did not participate in assessment. Per daughter in law patient's [name] is her HPOA. [name] reported that the family has not decided about surgery. CSW explained that once family decides on treatment options then a disposition can be determined. CSW explained that if patient does have surgery she will likely have to go to SNF for short term rehab. CSW explained that if patient does not have surgery then patient may be able to go back to  [facility] with hospice care if she qualifies for that. Daughter in law verbalized her understanding. CSW contacted [facility] and spoke to [facility] and Doctor, general practice. Per Tiffany patient has been at [facility] for 1 month and uses a wheel chair at baseline. Per Jonelle Sidle and daughter in law patient needs assistance for transfers at baseline. Per Tiffany patient can return to [facility] with hospice if that is what the family decides. CSW will continue to follow and assist as needed.   Employment status:  Disabled (Comment on whether or not currently receiving Disability) Insurance information:  Medicare PT Recommendations:  Not assessed at this time Information / Referral to community resources:  Other (Comment Required)(ALF vs. SNF)  Patient/Family's Response to care: Patient's family is considering surgery.   Patient/Family's Understanding of and Emotional Response to Diagnosis, Current Treatment, and Prognosis: Patient's family is waiting to meet with Ortho surgeon and palliative care about options.   Emotional Assessment Appearance:  Appears stated age Attitude/Demeanor/Rapport:  Unable to Assess Affect (typically observed):  Unable to Assess Orientation:  Oriented to Self, Fluctuating Orientation (Suspected and/or reported Sundowners) Alcohol / Substance use:  Not Applicable Psych involvement (Current and /or in the community):  No (Comment)  Discharge Needs  Concerns to be addressed:  Discharge Planning Concerns Readmission within the last 30 days:  No Current discharge risk:  Dependent with Mobility, Cognitively Impaired, Chronically ill Barriers to Discharge:  Continued Medical Work up  Shukri Nistler, Veronia Beets, LCSW 10/06/2017, 12:17 PM

## 2017-10-06 NOTE — Anesthesia Preprocedure Evaluation (Addendum)
Anesthesia Evaluation  Patient identified by MRN, date of birth, ID band Patient confused    Reviewed: Allergy & Precautions, NPO status , Patient's Chart, lab work & pertinent test results  Airway        Dental   Pulmonary pneumonia, resolved, PE          Cardiovascular hypertension, + CAD, + Peripheral Vascular Disease and + DVT  + dysrhythmias Atrial Fibrillation      Neuro/Psych Anxiety  Neuromuscular disease    GI/Hepatic   Endo/Other    Renal/GU      Musculoskeletal  (+) Arthritis ,   Abdominal   Peds negative pediatric ROS (+)  Hematology  (+) anemia ,   Anesthesia Other Findings Past Medical History: No date: Anxiety No date: Arthritis No date: Atrial fibrillation ([place]) No date: Coronary arteriosclerosis 09/2017: DVT (deep venous thrombosis) ([place]) 2014: History of blood transfusion No date: Hyperlipidemia No date: Hypertension No date: Multiple myeloma ([place]) 10/29/2014: Multiple myeloma in relapse ([place]) No date: Osteonecrosis of jaw ([place])     Comment:  left 09/2017: Pulmonary emboli (HCC)  Reproductive/Obstetrics                             Anesthesia Physical Anesthesia Plan  ASA: IV  Anesthesia Plan: General   Post-op Pain Management:    Induction:   PONV Risk Score and Plan: TIVA  Airway Management Planned: Nasal Cannula  Additional Equipment:   Intra-op Plan:   Post-operative Plan:   Informed Consent: I have reviewed the patients History and Physical, chart, labs and discussed the procedure including the risks, benefits and alternatives for the proposed anesthesia with the patient or authorized representative who has indicated his/her understanding and acceptance.   Dental advisory given  Plan Discussed with: CRNA and Surgeon  Anesthesia Plan Comments:        Anesthesia Quick Evaluation

## 2017-10-06 NOTE — Progress Notes (Signed)
[facility] [place] NAME: [name]    MR#:  [id number]  DATE OF BIRTH:  Jan 30, 1925  SUBJECTIVE:  CHIEF COMPLAINT:   Chief Complaint  Patient presents with  . Fall  . Hip Pain  seem to be actively dying, family at bedside REVIEW OF SYSTEMS:  Review of Systems  Unable to perform ROS: Dementia   DRUG ALLERGIES:   Allergies  Allergen Reactions  . Biaxin [Clarithromycin] Diarrhea and Nausea And Vomiting  . Demerol [Meperidine] Nausea And Vomiting  . Hydrocodone Nausea And Vomiting  . Sulfa Antibiotics Hives  . Tramadol Diarrhea and Nausea And Vomiting  . Ultracet [Tramadol-Acetaminophen] Other (See Comments)   VITALS:  Blood pressure 135/75, pulse (!) 118, temperature 98.9 F (37.2 C), temperature source Oral, resp. rate 19, height '5\' 2"'$  (1.575 m), weight 43.1 kg (95 lb), SpO2 99 %. PHYSICAL EXAMINATION:  Physical Exam  Constitutional: She is oriented to person, place, and time. She appears listless. She appears cachectic.  HENT:  Head: Normocephalic and atraumatic.  Eyes: Pupils are equal, round, and reactive to light. Conjunctivae and EOM are normal.  Neck: Normal range of motion. Neck supple. No tracheal deviation present. No thyromegaly present.  Cardiovascular: Normal rate, regular rhythm and normal heart sounds.  Pulmonary/Chest: Effort normal and breath sounds normal. No respiratory distress. She has no wheezes. She exhibits no tenderness.  Abdominal: Soft. Bowel sounds are normal. She exhibits no distension. There is no tenderness.  Musculoskeletal:       Left hip: She exhibits decreased range of motion, decreased strength, tenderness and bony tenderness.  Tender lt leg  Neurological: She is oriented to person, place, and time. She appears listless. No cranial nerve deficit.  Skin: Skin is warm and dry. No rash noted.   LABORATORY PANEL:  Female CBC Recent Labs  Lab 10/06/17 0252  WBC 6.8  HGB 12.6  HCT 37.1    PLT 277   ------------------------------------------------------------------------------------------------------------------ Chemistries  Recent Labs  Lab 10/06/17 0252  NA 134*  K 3.9  CL 105  CO2 23  GLUCOSE 107*  BUN 13  CREATININE 0.55  CALCIUM 8.1*   RADIOLOGY:  No results found. ASSESSMENT AND PLAN:  84 year old woman with a known history of dementia, multiple myeloma, HLD, HTN, A fib and DVT admitted to [facility] from [facility] care following a fall. She has a nondisplaced, nonangulated fracture of the mid left femoral neck. She is not a surgical candidate and family wishes to focus on comfort with transfer to the hospice home tomorrow if beds available.  * left hip fracture * chronic dementia * multiple myeloma: d/w Dr Mayer Masker - he agrees with Hospice * recent DVT and PE        All the records are reviewed and case discussed with Care Management/Social Worker. Management plans discussed with the patient, family (daughter in law, son) and they are in agreement.  CODE STATUS: DNR  TOTAL TIME TAKING CARE OF THIS PATIENT: 25 minutes.   More than 50% of the time was spent in counseling/coordination of care: YES  POSSIBLE D/C IN 1 DAYS, DEPENDING ON CLINICAL CONDITION.   Max Sane M.D on 10/06/2017 at 7:01 PM  Between 7am to 6pm - Pager - [phone]  After 6pm go to [email or web address] - Proofreader  [facility]  Office  [phone]  CC: Primary care physician; [facility]  Note: This dictation was prepared with Dragon dictation  along with smaller phrase technology. Any transcriptional errors that result from this process are unintentional.

## 2017-10-06 NOTE — Progress Notes (Signed)
Clinical Education officer, museum (CSW) discussed case with MD and RN. Patient's family has decided to not do surgery and they want hospice. Per MD patient is eligible for the hospice facility. CSW met with patient and her son Merry Proud and daughter in Psychiatric nurse were at bedside. CSW provided hospice agency choice and they chose [facility]. [facility] liaison is aware of above.   [employer], LCSW [phone]

## 2017-10-06 NOTE — Progress Notes (Signed)
New hospice home referral received from [place] following a family discussion with attending physician Dr. Manuella Ghazi. Patient is a 84 year old woman with a known history of dementia, multiple myeloma, HLD, HTN, A fib and DVT admitted to [facility] from [facility] care following a fall. Hip xray in the ED revealed a nondisplaced, nonangulated fracture of the mid left femoral neck. She is not a surgical candidate and family wishes to focus on comfort with transfer to the hospice home.  Writer met in the room with patient, her niece Kieth Brightly, son Merry Proud and his girl friend Teacher, music to initiate education regarding hospice services, philosophy and team approach to care with good understanding voiced. Questions answered, consents signed. Patient was alert through out the visit but did not participate in the conversation. She has required 3 doses of PRN morphine for pain control since 3:30 am, Buck's traction in place. Writer did explain to family that the Buck's traction will not be continued at the Hospice home, they voiced understanding. Plan is for discharge to the hospice home tomorrow 4/30. Hospital care team updated. Patient information faxed to referral. Patient will require EMS transport, signed DNR to accompany patient. Will continue to follow through final disposition. Flo Shanks RN, BSN, [place] and Palliative Care of [facility] hospital  Liaison [phone]

## 2017-10-06 NOTE — Progress Notes (Signed)
Chaplain responded to a OR for Palliative care as Pt family is making a difficult decision regarding surgery and EOL concerns. Chaplain offered support and prayer in their decisions. Family accepted for Pt. Chaplain prayed for divine presence and guidance for the Pt and family. Chaplain let them know about 24 hour support and follow up for the period the Pt is here. Chaplain will give daily follow up.    10/06/17 1300  Clinical Encounter Type  Visited With Patient and family together  Visit Type Initial;Spiritual support  Referral From Nurse  Spiritual Encounters  Spiritual Needs Prayer

## 2017-10-06 NOTE — Progress Notes (Signed)
Initial Nutrition Assessment  DOCUMENTATION CODES:   Severe malnutrition in context of chronic illness, Underweight  INTERVENTION:  Provide Magic cup TID with meals, each supplement provides 290 kcal and 9 grams of protein.  If there is concern for dysphagia/aspiration risk recommend obtaining SLP consult.  NUTRITION DIAGNOSIS:   Severe Malnutrition related to chronic illness(multiple myeloma, dementia) as evidenced by severe fat depletion, severe muscle depletion.  GOAL:   Patient will meet greater than or equal to 90% of their needs  MONITOR:   PO intake, Supplement acceptance, Labs, Weight trends, Skin, I & O's  REASON FOR ASSESSMENT:   Malnutrition Screening Tool    ASSESSMENT:   84 year old female with PMHx of dementia, osteonecrosis of jaw, anxiety, HTN, HLD, A-fib, multiple myeloma no longer on treatment as pt could not tolerate in the past, hx pulmonary emboli, hx DVT who is admitted after mechanical fall and found to have left hip fracture.   -Pending orthopedic consult to determine if patient is a surgical candidate. -Family also deciding on goals of care. Considering residential hospice.  Met with patient and her family at bedside. Family reports patient is a resident at [facility]. They report she has had a poor appetite for a long time now. She always skips breakfast and then only eats bites of lunch and dinner. She was on a mechanical soft diet with thin liquids PTA. She does not like oral nutrition supplements and typically refuses them. She does enjoy ice ream.  Family reports her UBW is around 110 lbs and she is now below 100 lbs. Per chart she was 110.7 lbs on 08/11/2017 and was 95 lbs on 09/15/2017. That is a weight loss of 15.7 lbs (14.2% body weight) over one month, which is significant for time frame. Unsure if current weight is accurate and unable to obtain accurate weight on bed scale due to many items on bed at time of RD assessment.  Medications reviewed  and include: Colace, Megace 80 mg BID, Remeron 30 mg daily, Miralax, NS @ 75 mL/hr, cefazolin, clindamycin.  Labs reviewed: Sodium 134.  Discussed with RN. Diet was downgraded to dysphagia 1 with thin liquids today.  NUTRITION - FOCUSED PHYSICAL EXAM:    Most Recent Value  Orbital Region  Severe depletion  Upper Arm Region  Severe depletion  Thoracic and Lumbar Region  Severe depletion  Buccal Region  Severe depletion  Temple Region  Severe depletion  Clavicle Bone Region  Severe depletion  Clavicle and Acromion Bone Region  Severe depletion  Scapular Bone Region  Severe depletion  Dorsal Hand  Severe depletion  Patellar Region  Severe depletion  Anterior Thigh Region  Severe depletion  Posterior Calf Region  Severe depletion  Edema (RD Assessment)  -- [non-pitting edema]  Hair  Reviewed  Eyes  Reviewed  Mouth  Unable to assess  Skin  Reviewed  Nails  Reviewed     Diet Order:  DIET - DYS 1 Room service appropriate? Yes; Fluid consistency: Thin  EDUCATION NEEDS:   Not appropriate for education at this time  Skin:  Skin Assessment: Reviewed RN Assessment  Last BM:  Unknown  Height:   Ht Readings from Last 1 Encounters:  10/05/17 '5\' 2"'  (1.575 m)    Weight:   Wt Readings from Last 1 Encounters:  10/05/17 95 lb (43.1 kg)    Ideal Body Weight:  50 kg  BMI:  Body mass index is 17.38 kg/m.  Estimated Nutritional Needs:   Kcal:  1200-1400  Protein:  65-75 grams  Fluid:  1.2-1.4 L/day  Willey Blade, MS, RD, LDN Office: [phone] Pager: [phone] After Hours/Weekend Pager: [phone]

## 2017-10-07 MED ORDER — MORPHINE SULFATE 20 MG/5ML PO SOLN: 2.5000 mg | ORAL | 0 refills | Status: AC | PRN

## 2017-10-07 MED ORDER — LORAZEPAM 1 MG PO TABS: 1.0000 mg | ORAL_TABLET | ORAL | 0 refills | Status: AC | PRN

## 2017-10-07 NOTE — Discharge Instructions (Signed)
Hospice Introduction Hospice is a service that is designed to provide people who are terminally ill and their families with medical, spiritual, and psychological support. Its aim is to improve your quality of life by keeping you as alert and comfortable as possible. Who will be my providers when I begin hospice care? Hospice teams often include:  A nurse.  A doctor. The hospice doctor will be available for your care, but you can bring your regular doctor or nurse practitioner.  Social workers.  Religious leaders (such as a Clinical biochemist).  Trained volunteers.  What roles will providers play in my care? Hospice is performed by a team of health care professionals and volunteers who:  Help keep you comfortable: ? Hospice can be provided in your home or in a homelike setting. ? The hospice staff works with your family and friends to help meet your needs. ? You will enjoy the support of loved ones by receiving much of your basic care from family and friends.  Provide pain relief and manage your symptoms. The staff supply all necessary medicines and equipment.  Provide companionship when you are alone.  Allow you and your family to rest. They may do light housekeeping, prepare meals, and run errands.  Provide counseling. They will make sure your emotional, spiritual, and social needs and those of your family are being met.  Provide spiritual care: ? Spiritual care will be individualized to meet your needs and your family's needs. ? Spiritual care may involve:  Helping you look at what death means to you.  Helping you say goodbye to your family and friends.  Performing a specific religious ceremony or ritual.  When should hospice care begin? Most people who use hospice are believed to have fewer than 6 months to live.  Your family and health care providers can help you decide when hospice services should begin.  If your condition improves, you may discontinue the program.  What  should I consider before selecting a program? Most hospice programs are run by nonprofit, independent organizations. Some are affiliated with hospitals, nursing homes, or home health care agencies. Hospice programs can take place in the home or at a hospice center, hospital, or skilled nursing facility. When choosing a hospice program, ask the following questions:  What services are available to me?  What services will be offered to my loved ones?  How involved will my loved ones be?  How involved will my health care provider be?  Who makes up the hospice care team? How are they trained or screened?  How will my pain and symptoms be managed?  If my circumstances change, can the services be provided in a different setting, such as my home or in the hospital?  Is the program reviewed and licensed by the state or certified in some other way?  Where can I learn more about hospice? You can learn about existing hospice programs in your area from your health care providers. You can also read more about hospice online. The websites of the following organizations contain helpful information:  The [facility] and Palliative Care Organization [facility]).  The Hospice Association of America ([place]).  The [place].  The American Cancer Society (ACS).  Hospice Net.  This information is not intended to replace advice given to you by your health care provider. Make sure you discuss any questions you have with your health care provider. Document Released: 09/13/2003 Document Revised: 01/11/2016 Document Reviewed: 04/06/2013 Elsevier Interactive Patient Education  2017 [employer].

## 2017-10-07 NOTE — Progress Notes (Signed)
EMS here to transport pt. Left message for Merry Proud (pts son)

## 2017-10-07 NOTE — Progress Notes (Signed)
Per [facility] liaison patient can come to the hospice facility today. Clinical Education officer, museum (CSW) completed EMS form including DNR. [place] and Doctor, general practice at [facility] is aware of above. Please reconsult if future social work needs arise. CSW signing off.   [employer], LCSW [phone]

## 2017-10-07 NOTE — Progress Notes (Signed)
Follow up visit made to new hospice home referral. Patient seen sitting up in bed, asleep, awakened to voice, but closed her eyes and fell back asleep readily,. No verbal response. Son Merry Proud and girl friend [name] present. Updated on plan for transfer to the hospice home today via EMS with a 5 pm pickup. Understanding and agreement voiced. Hospital care team updated. Patient to be premedicated prior to transport and removal of Buck's traction, family in agreement and staff RN Ana updated. Report called to the hospice home. Discharge summary faxed to referral. Flo Shanks RN, BSN, [facility] and Palliative Care of [place], hospital Liaison [phone]

## 2017-10-07 NOTE — Progress Notes (Signed)
Pts son notified pt was picked up by EMS

## 2017-10-07 NOTE — Discharge Summary (Signed)
[place] [facility] [place] NAME: [name]    MR#:  [id number]  DATE OF BIRTH:  1924-06-25  DATE OF ADMISSION:  10/05/2017   ADMITTING PHYSICIAN: Vaughan Basta, MD  DATE OF DISCHARGE: 10/07/2017  PRIMARY CARE PHYSICIAN: [place], [facility]   ADMISSION DIAGNOSIS:  Closed fracture of neck of left femur, initial encounter ([place]) [S72.002A] DISCHARGE DIAGNOSIS:  Principal Problem:   Hip fracture ([place])  SECONDARY DIAGNOSIS:   Past Medical History:  Diagnosis Date  . Anxiety   . Arthritis   . Atrial fibrillation ([place])   . Coronary arteriosclerosis   . DVT (deep venous thrombosis) ([place]) 09/2017  . History of blood transfusion 2014  . Hyperlipidemia   . Hypertension   . Multiple myeloma ([place])   . Multiple myeloma in relapse ([place]) 10/29/2014  . Osteonecrosis of jaw ([place])    left  . Pulmonary emboli ([place]) 09/2017   HOSPITAL COURSE:  84 year old woman with a known history of dementia, multiple myeloma, HLD, HTN, A fib and DVT admitted to [facility] from [facility] care following a fall.She hasa nondisplaced, nonangulated fracture of the mid left femoral neck. She is not a surgical candidate and family wishes to focus on comfort with transfer to the hospice home when beds available.  * left hip fracture * chronic dementia * multiple myeloma: d/w Dr Mayer Masker - he agrees with Hospice * recent DVT and PE DISCHARGE CONDITIONS:  critical CONSULTS OBTAINED:  Treatment Team:  Earnestine Leys, MD DRUG ALLERGIES:   Allergies  Allergen Reactions  . Biaxin [Clarithromycin] Diarrhea and Nausea And Vomiting  . Demerol [Meperidine] Nausea And Vomiting  . Hydrocodone Nausea And Vomiting  . Sulfa Antibiotics Hives  . Tramadol Diarrhea and Nausea And Vomiting  . Ultracet [Tramadol-Acetaminophen] Other (See Comments)   DISCHARGE MEDICATIONS:   Allergies as of 10/07/2017      Reactions   Biaxin [clarithromycin] Diarrhea,  Nausea And Vomiting   Demerol [meperidine] Nausea And Vomiting   Hydrocodone Nausea And Vomiting   Sulfa Antibiotics Hives   Tramadol Diarrhea, Nausea And Vomiting   Ultracet [tramadol-acetaminophen] Other (See Comments)      Medication List    STOP taking these medications   acyclovir 400 MG tablet Commonly known as:  ZOVIRAX   apixaban 5 MG Tabs tablet Commonly known as:  ELIQUIS   bisacodyl 5 MG EC tablet Commonly known as:  DULCOLAX   COLACE PO   divalproex 125 MG capsule Commonly known as:  DEPAKOTE SPRINKLE   fentaNYL 25 MCG/HR patch Commonly known as:  DURAGESIC - dosed mcg/hr   megestrol 40 MG tablet Commonly known as:  MEGACE   Melatonin 5 MG Tabs   oxyCODONE 5 MG immediate release tablet Commonly known as:  Oxy IR/ROXICODONE   polyethylene glycol packet Commonly known as:  MIRALAX / GLYCOLAX     TAKE these medications   LORazepam 1 MG tablet Commonly known as:  ATIVAN Take 1 tablet (1 mg total) by mouth every 4 (four) hours as needed for anxiety.   mirtazapine 30 MG tablet Commonly known as:  REMERON TAKE 1 TABLET BY MOUTH AT BEDTIME   morphine 20 MG/5ML solution Take 0.6 mLs (2.4 mg total) by mouth every 2 (two) hours as needed for pain.   risperiDONE 1 MG tablet Commonly known as:  RISPERDAL Take 1 tablet (1 mg total) by mouth at bedtime.        DISCHARGE INSTRUCTIONS:   DIET:  Pleasure feeding DISCHARGE CONDITION:  Critical ACTIVITY:  Activity as tolerated OXYGEN:  Home Oxygen: Yes.    Oxygen Delivery: 2 liters/min via Patient connected to nasal cannula oxygen DISCHARGE LOCATION:  Hospice Home   If you experience worsening of your admission symptoms, develop shortness of breath, life threatening emergency, suicidal or homicidal thoughts you must seek medical attention immediately by calling 911 or calling your MD immediately  if symptoms less severe.  You Must read complete instructions/literature along with all the possible  adverse reactions/side effects for all the Medicines you take and that have been prescribed to you. Take any new Medicines after you have completely understood and accpet all the possible adverse reactions/side effects.   Please note  You were cared for by a hospitalist during your hospital stay. If you have any questions about your discharge medications or the care you received while you were in the hospital after you are discharged, you can call the unit and asked to speak with the hospitalist on call if the hospitalist that took care of you is not available. Once you are discharged, your primary care physician will handle any further medical issues. Please note that NO REFILLS for any discharge medications will be authorized once you are discharged, as it is imperative that you return to your primary care physician (or establish a relationship with a primary care physician if you do not have one) for your aftercare needs so that they can reassess your need for medications and monitor your lab values.    On the day of Discharge:  VITAL SIGNS:  Blood pressure (!) 157/86, pulse 71, temperature 97.7 F (36.5 C), temperature source Oral, resp. rate 18, height '5\' 2"'$  (1.575 m), weight 43.1 kg (95 lb), SpO2 90 %. PHYSICAL EXAMINATION:  GENERAL:  84 y.o.-year-old patient lying in the bed - moaning, actively dying  EYES: Pupils equal, round, reactive to light and accommodation. No scleral icterus. Extraocular muscles intact.  HEENT: Head atraumatic, normocephalic. Oropharynx and nasopharynx clear.  NECK:  Supple, no jugular venous distention. No thyroid enlargement, no tenderness.  LUNGS: Normal breath sounds bilaterally, no wheezing, rales,rhonchi or crepitation. No use of accessory muscles of respiration.  CARDIOVASCULAR: S1, S2 normal. No murmurs, rubs, or gallops.  ABDOMEN: Soft, non-tender, non-distended. Bowel sounds present. No organomegaly or mass.  EXTREMITIES: No pedal edema, cyanosis, or  clubbing.  NEUROLOGIC: Cranial nerves II through XII are intact. Muscle strength 5/5 in all extremities. Sensation intact. Gait not checked.  PSYCHIATRIC: The patient is lethargic SKIN: No obvious rash, lesion, or ulcer.  DATA REVIEW:   CBC Recent Labs  Lab 10/06/17 0252  WBC 6.8  HGB 12.6  HCT 37.1  PLT 277    Chemistries  Recent Labs  Lab 10/06/17 0252  NA 134*  K 3.9  CL 105  CO2 23  GLUCOSE 107*  BUN 13  CREATININE 0.55  CALCIUM 8.1*     Follow-up Information    [place], [facility]. Schedule an appointment as soon as possible for a visit in 1 week(s).   Contact information: [place] [phone]            Management plans discussed with the patient, family and they are in agreement.  CODE STATUS: DNR   TOTAL TIME TAKING CARE OF THIS PATIENT: 45 minutes.    Max Sane M.D on 10/07/2017 at 8:20 AM  Between 7am to 6pm - Pager - [phone]  After 6pm go to [email or web address] -  password Airline pilot  [facility]  Office  [phone]  CC: Primary care physician; [facility]   Note: This dictation was prepared with Dragon dictation along with smaller phrase technology. Any transcriptional errors that result from this process are unintentional.

## 2017-10-13 ENCOUNTER — Telehealth: Payer: Self-pay | Admitting: *Deleted

## 2017-10-13 NOTE — Telephone Encounter (Signed)
[name] called to report that [name] passed away at 11:40 PM. The Medical Examiner is taking her for a small autopsy, which [name] said surprised them and will keep her body for 24 - 48 hours. She wants to convey thanks for all the great care she received here

## 2017-11-05 ENCOUNTER — Ambulatory Visit: Payer: PRIVATE HEALTH INSURANCE | Admitting: Internal Medicine

## 2017-11-05 ENCOUNTER — Other Ambulatory Visit: Payer: PRIVATE HEALTH INSURANCE

## 2017-11-08 DEATH — deceased

## 2018-07-25 IMAGING — CR DG CHEST 2V
2 series · 2 of 2 positions shown · non-contrast
Comparison: Chest x-ray of August 07, 2017

CLINICAL DATA: 2-3 days of cough. Recent episode of pneumonia.
History of atrial fibrillation, coronary artery disease, multiple
myeloma not in remission.

EXAM:
CHEST - 2 VIEW

[chest pa]
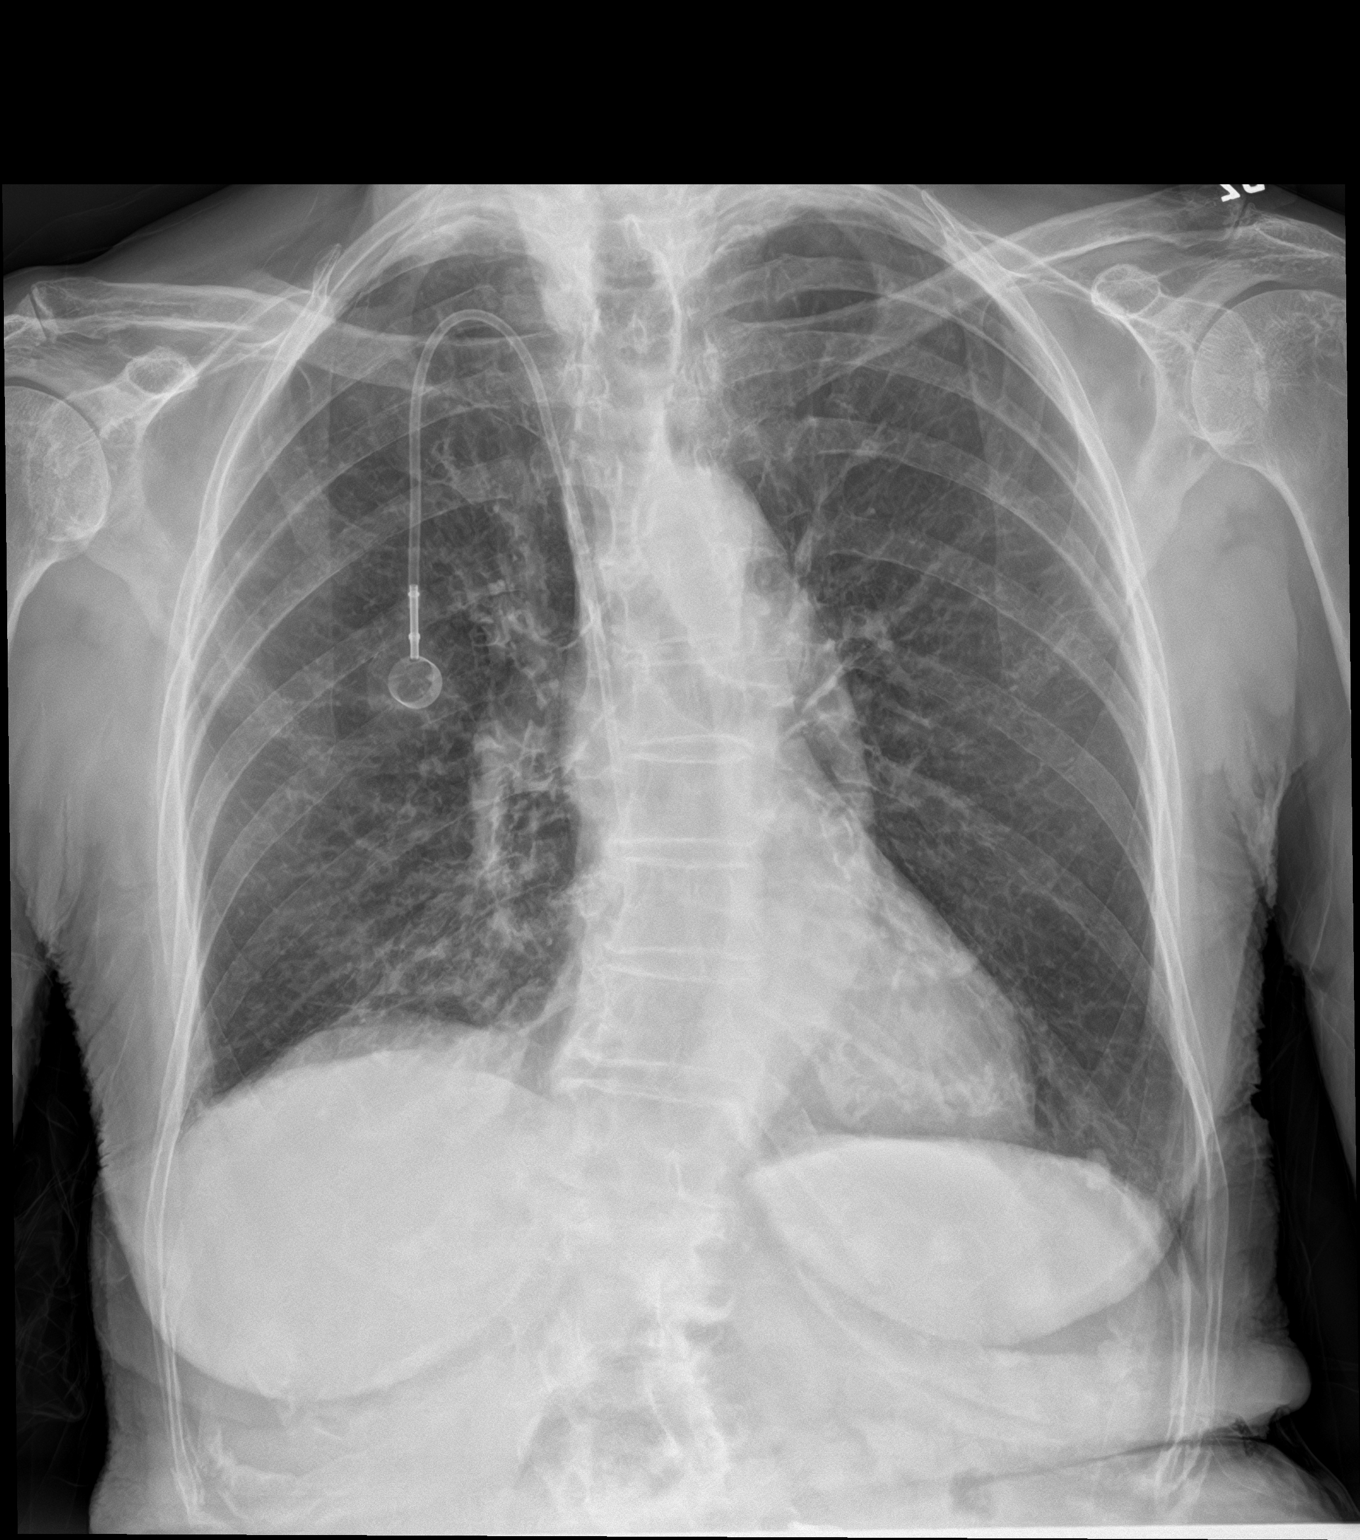

[chest lat]
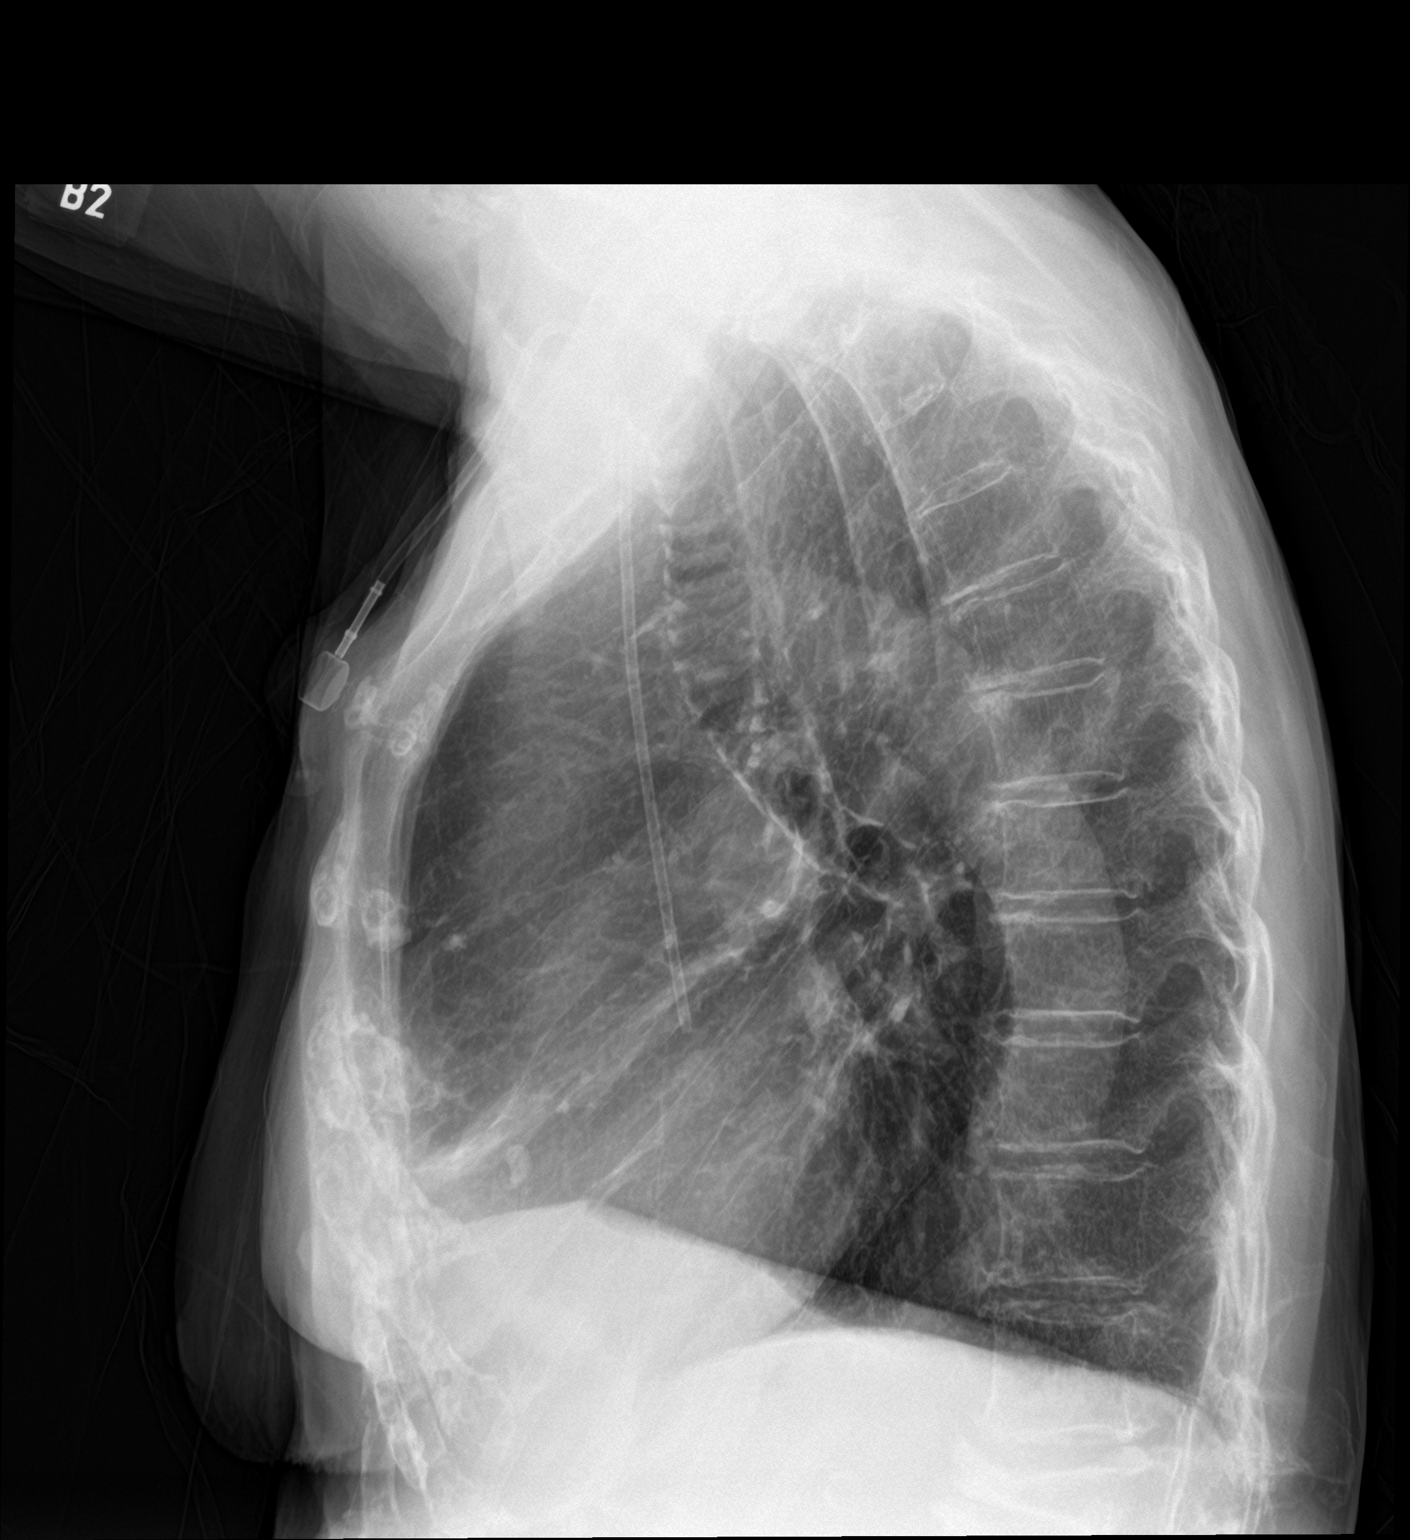

[2 of 2 positions shown; findings below may reference images not displayed]

FINDINGS: The lungs are well-expanded. There is no focal infiltrate. There is
no pleural effusion. The heart and pulmonary vascularity are normal.
There is tortuosity of the descending thoracic aorta with
calcification in the aortic arch. There is a stable calcified nodule
in the lingula. The power port catheter tip projects over the distal
third of the SVC. There is gentle levocurvature centered in the mid
to lower thoracic spine. There is high-grade compression of
approximately L1 which is chronic.
IMPRESSION: COPD.  No pneumonia nor CHF.  Previous granulomatous infection.

## 2018-08-28 IMAGING — CR DG HIP (WITH OR WITHOUT PELVIS) 2-3V*L*
3 series · 3 of 3 positions shown · non-contrast
Comparison: PET-CT, 12/13/2016.

CLINICAL DATA: Pt had witnessed fall by staff at [HOSPITAL]. Pt
presents w/ c/o L hip pain. Hx - multiple myeloma.

EXAM:
DG HIP (WITH OR WITHOUT PELVIS) 2-3V LEFT

[pelvis ap]
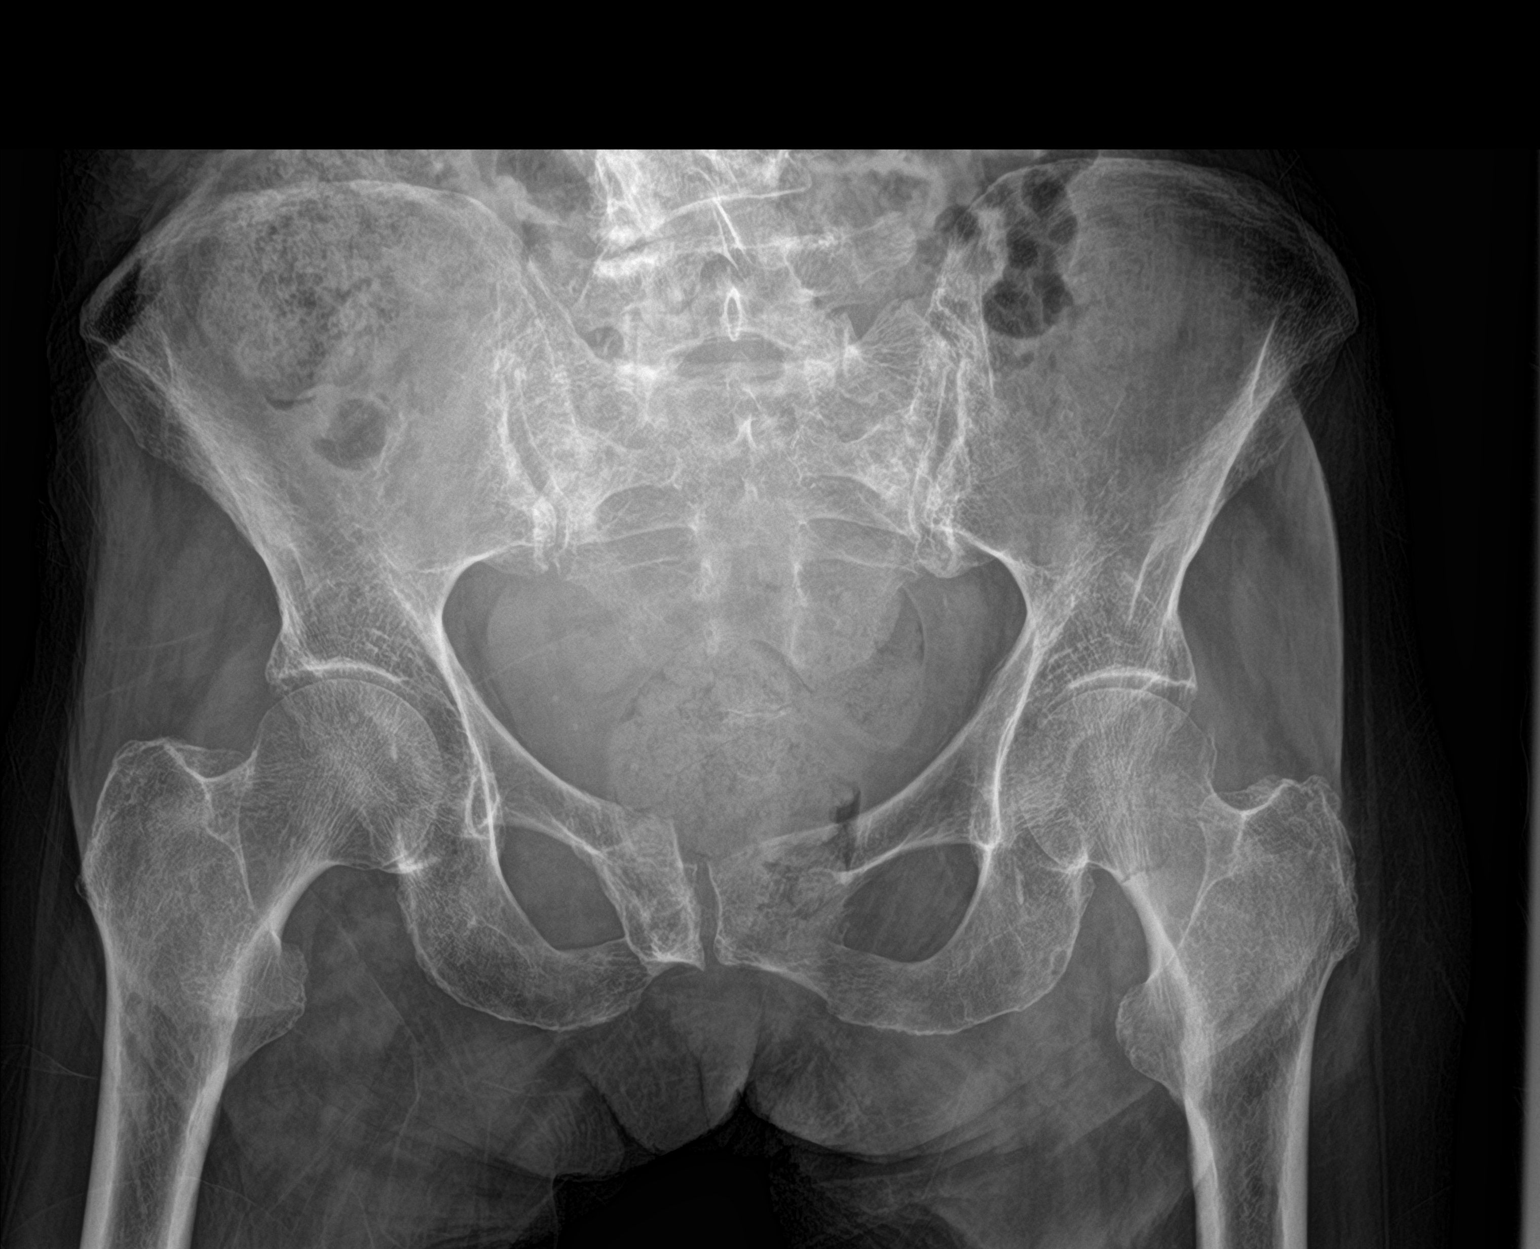

[hip ap]
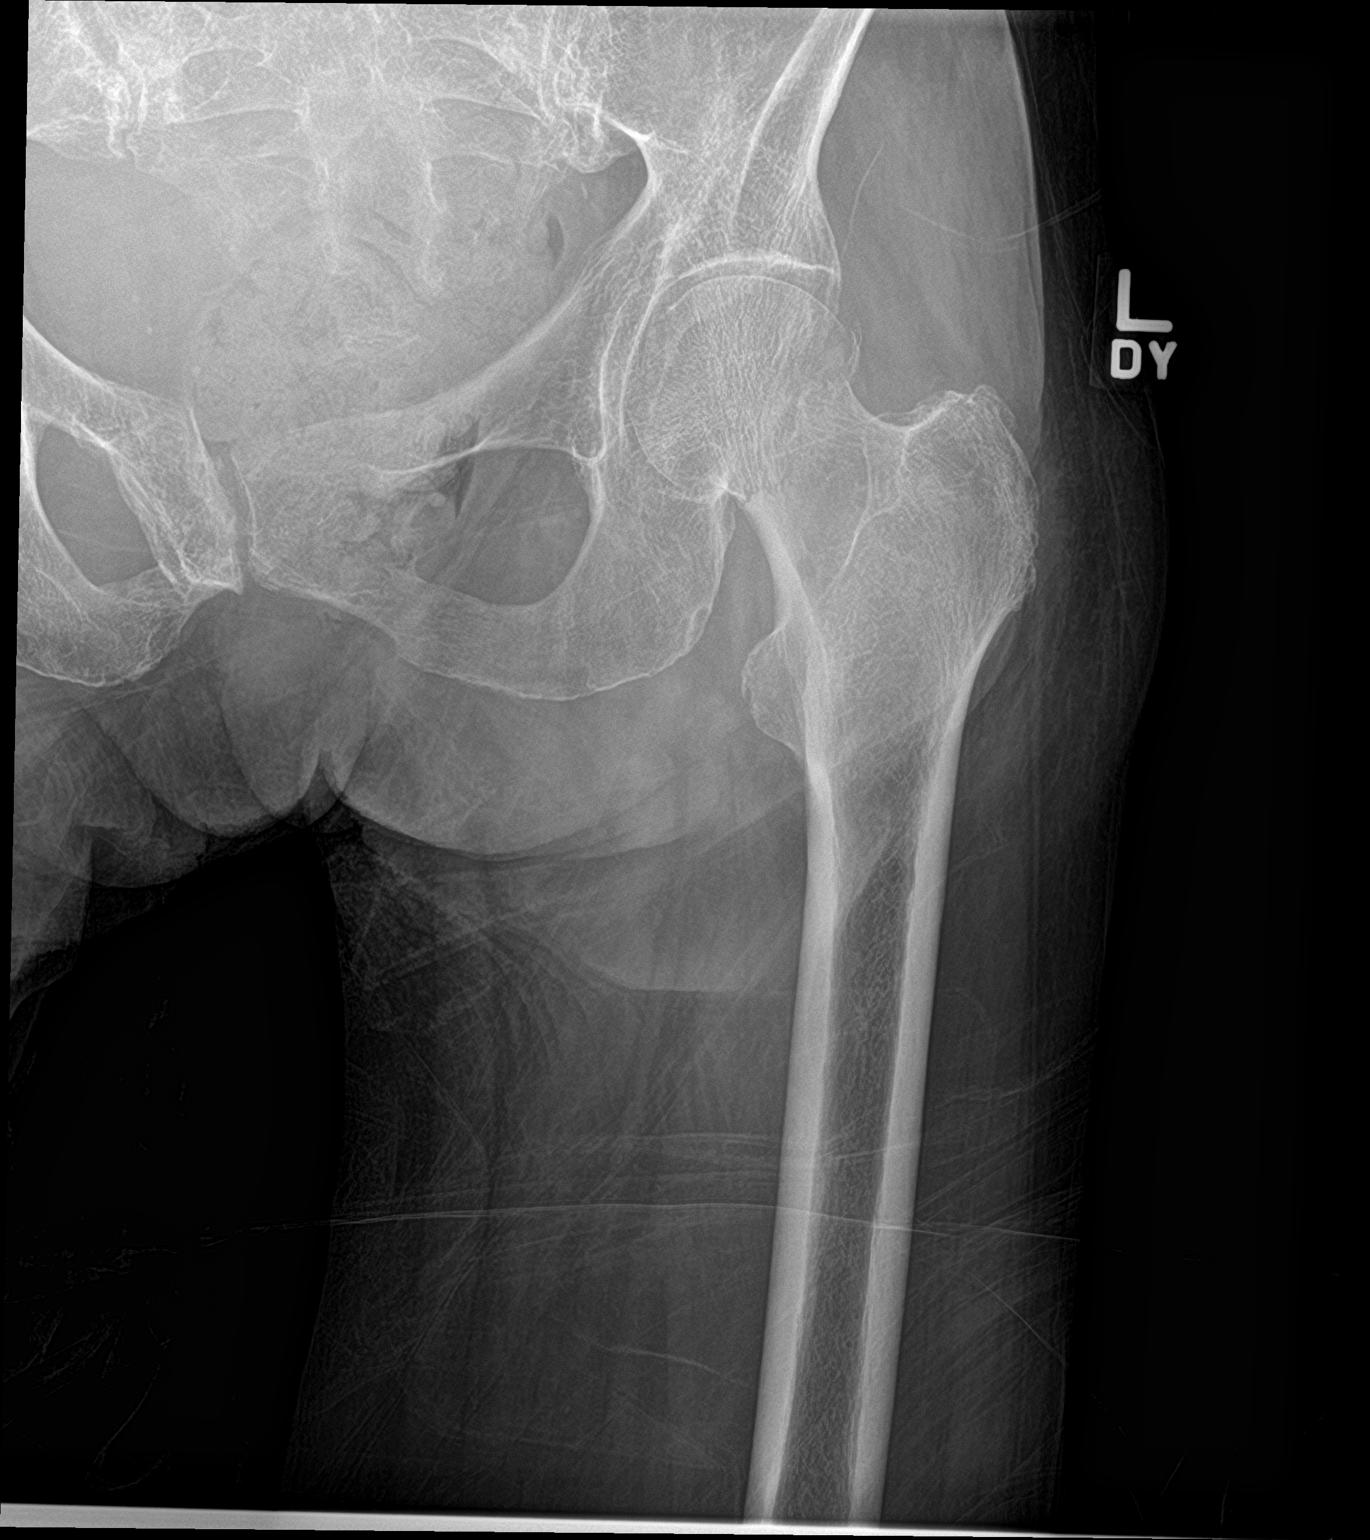

[hip lat]
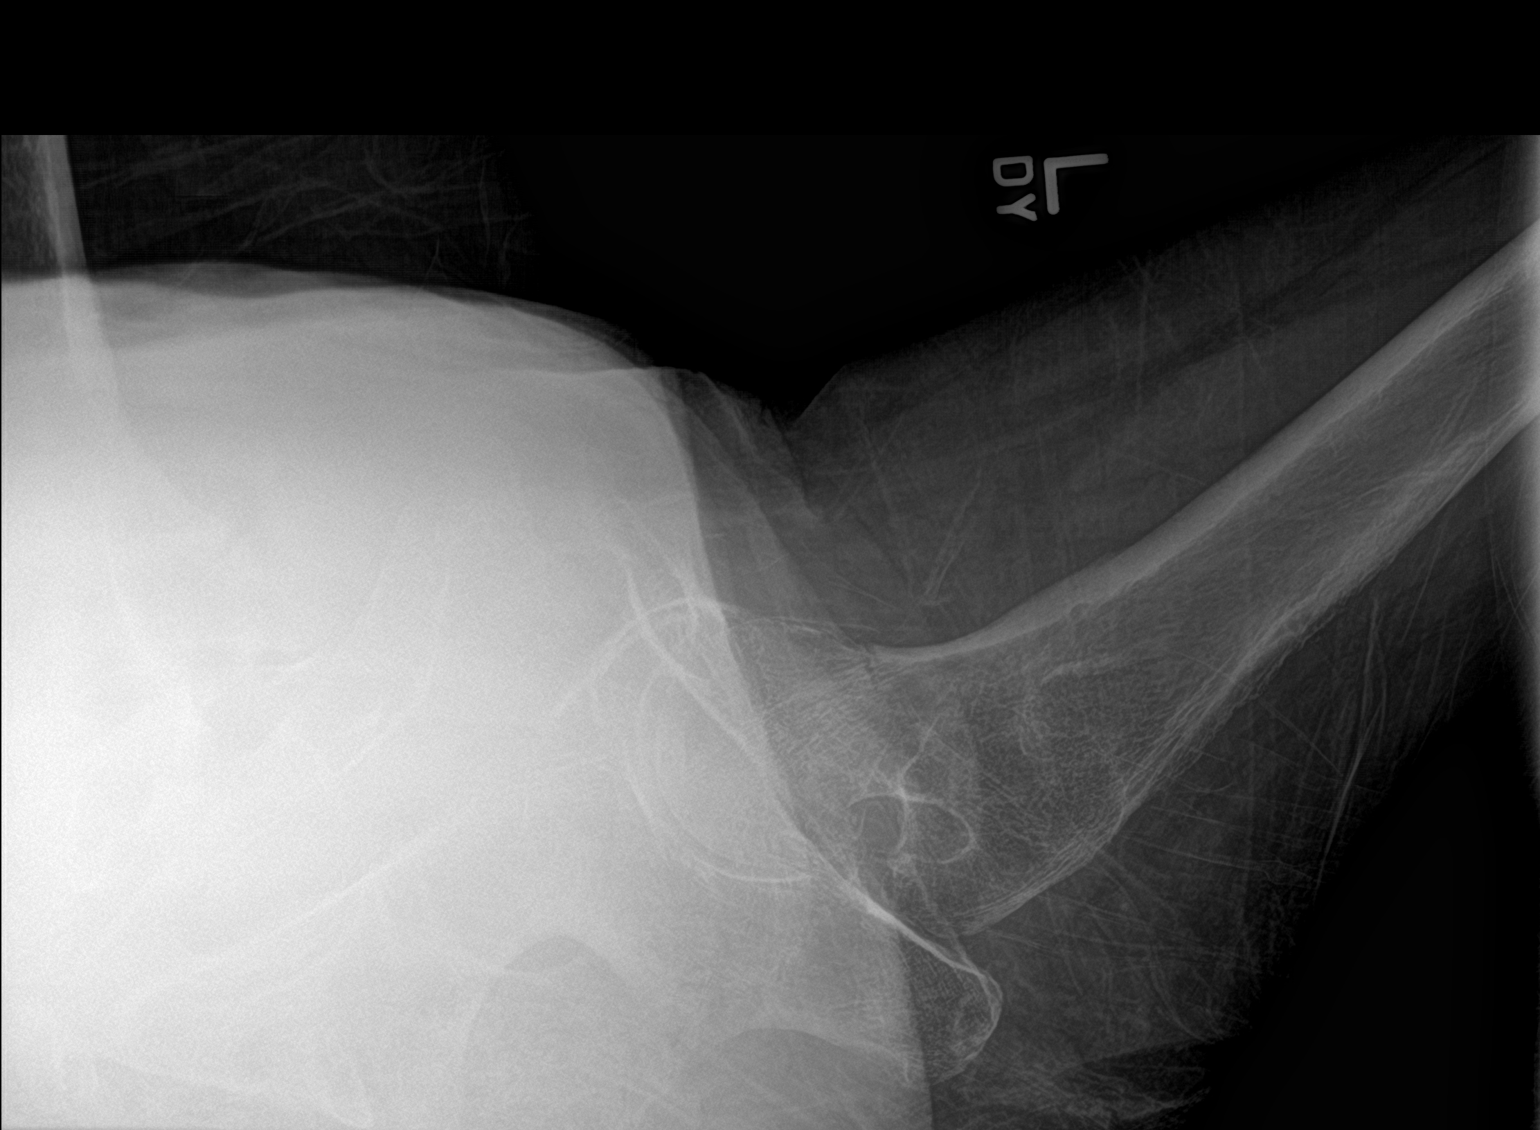

[3 of 3 positions shown; findings below may reference images not displayed]

FINDINGS: Acute nondisplaced, nonangulated fracture across the mid left
femoral neck.

No other acute fractures.

There are old sacral and right parasymphysis fractures, healed.

Bones are demineralized.

Hip joints, SI joints and symphysis pubis are normally aligned.
IMPRESSION: 1. Nondisplaced, nonangulated fracture of the mid left femoral neck.

## 2019-01-05 IMAGING — US US EXTREM LOW VENOUS*L*
1 series · 13 of 24 positions shown · non-contrast
Comparison: None.

CLINICAL DATA: Left lower extremity pain and swelling for 2 days



[Series 1: us extrem low venous*left* · 0.07mm/px · 13 of 32 slices shown]
[im 1/32]
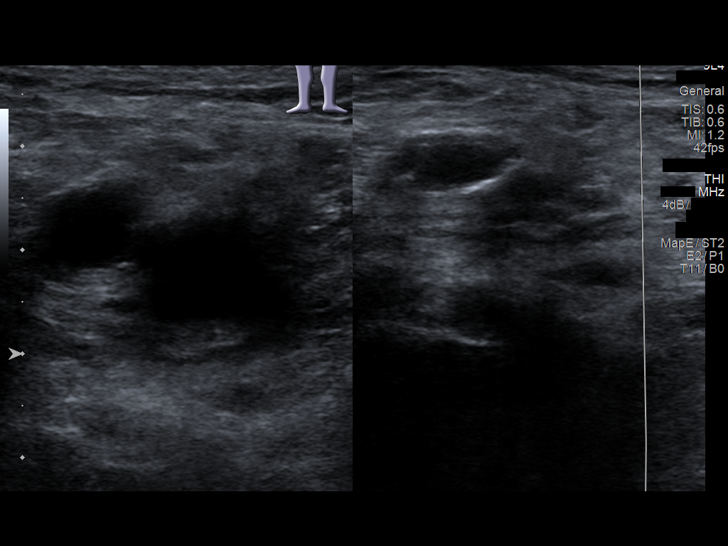
[im 3/32]
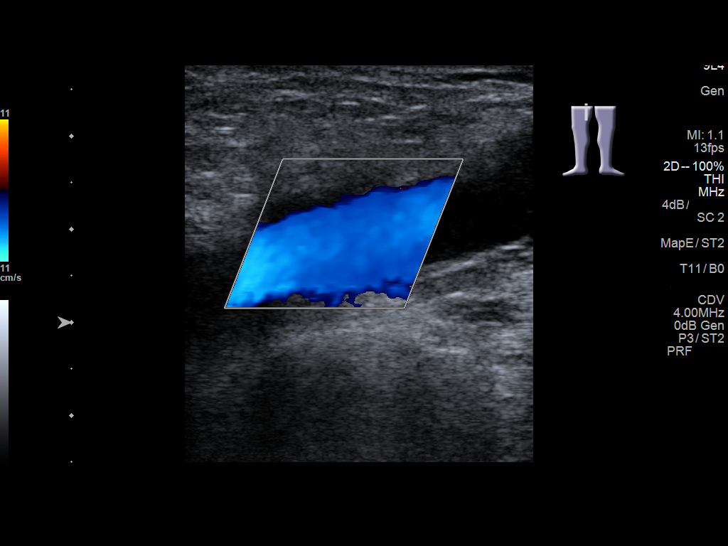
[im 6/32]
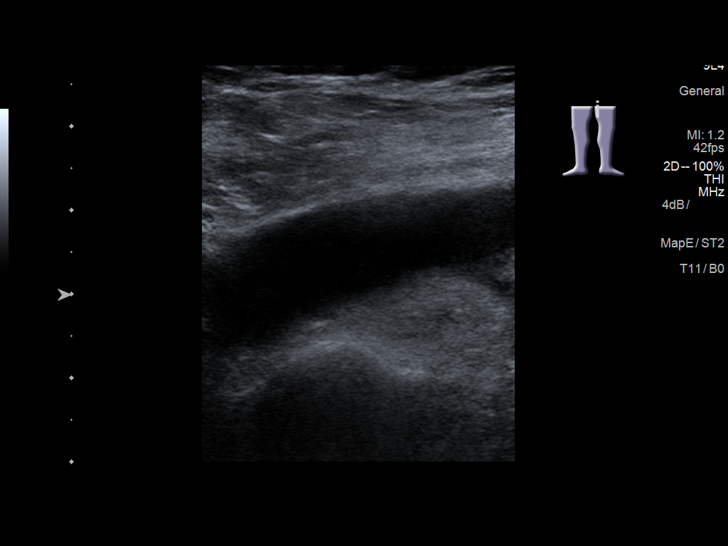
[im 9/32]
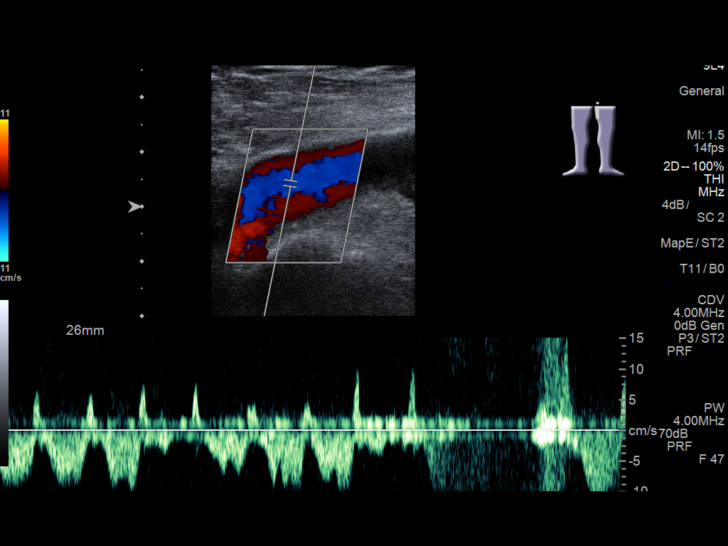
[im 11/32]
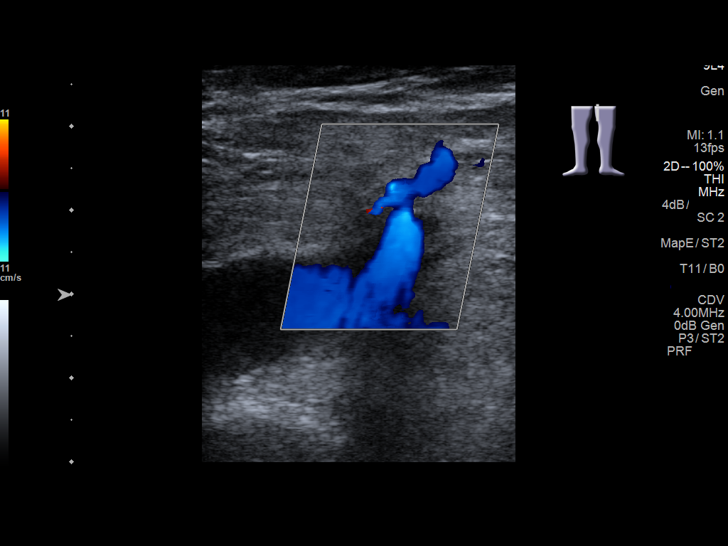
[im 14/32]
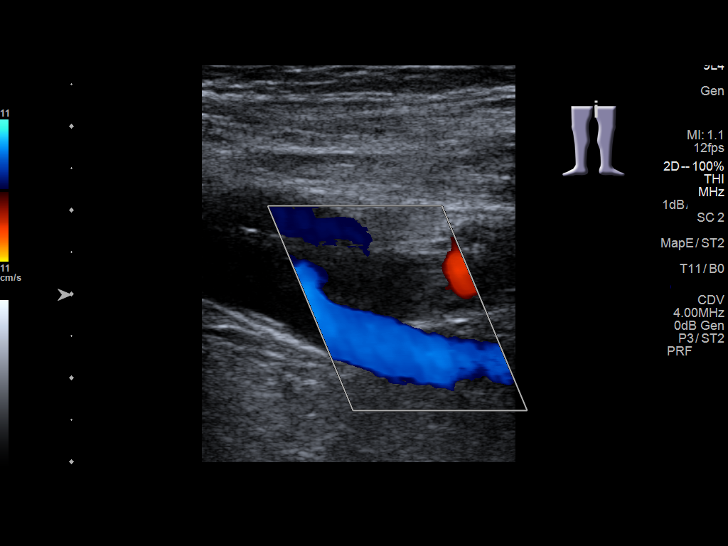
[im 17/32]
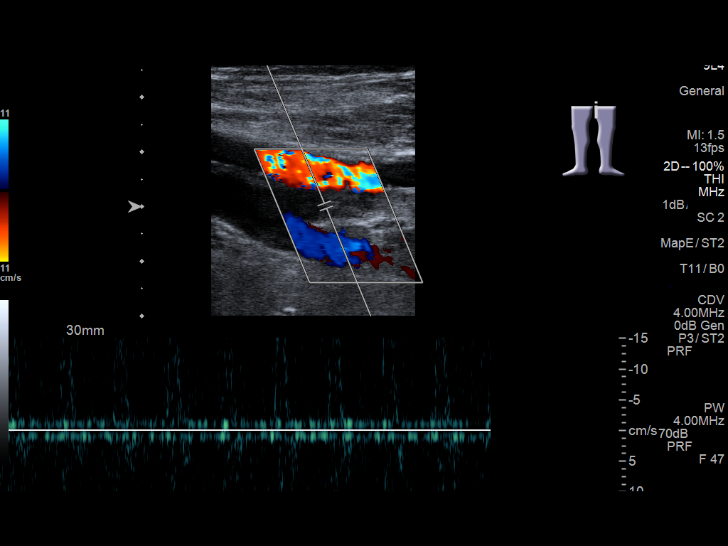
[im 18/32]
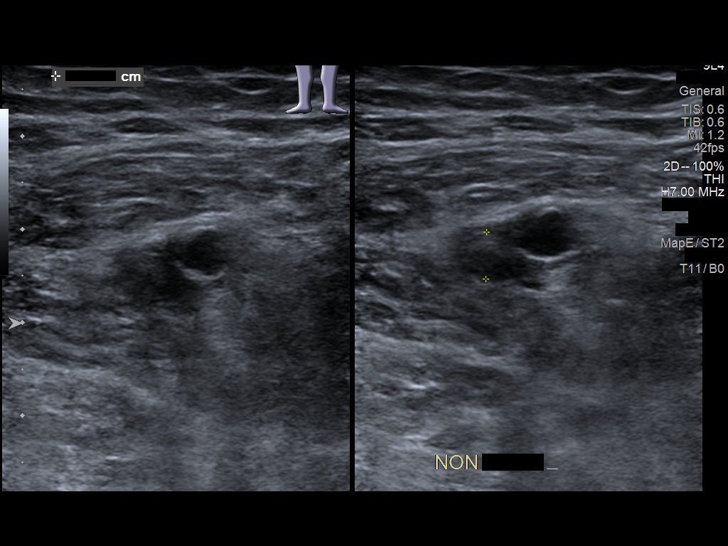
[im 21/32]
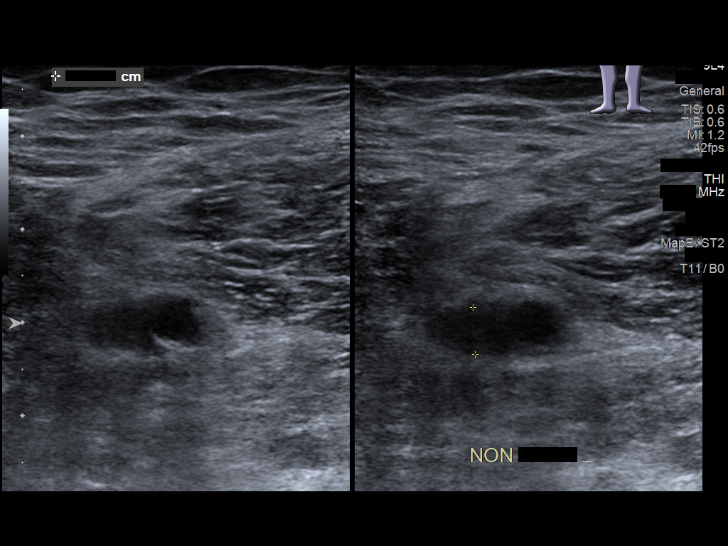
[im 23/32]
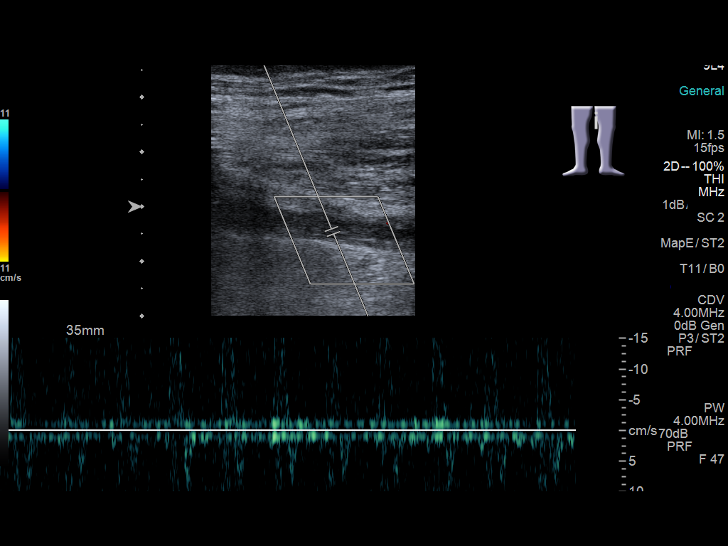
[im 26/32]
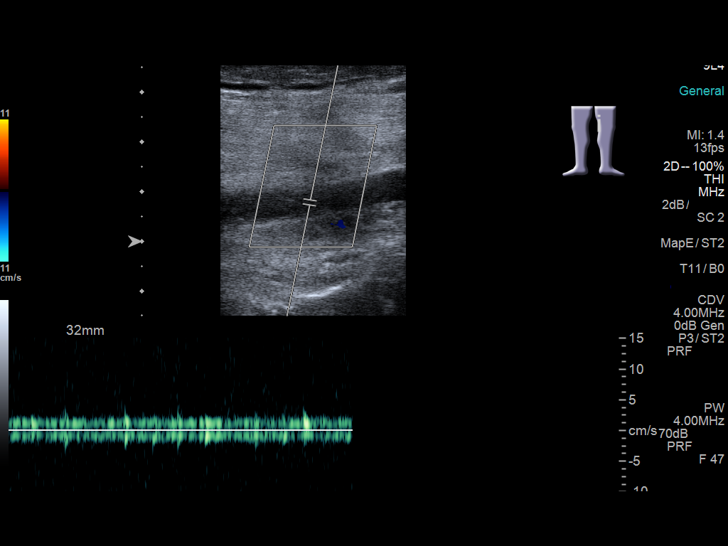
[im 29/32]
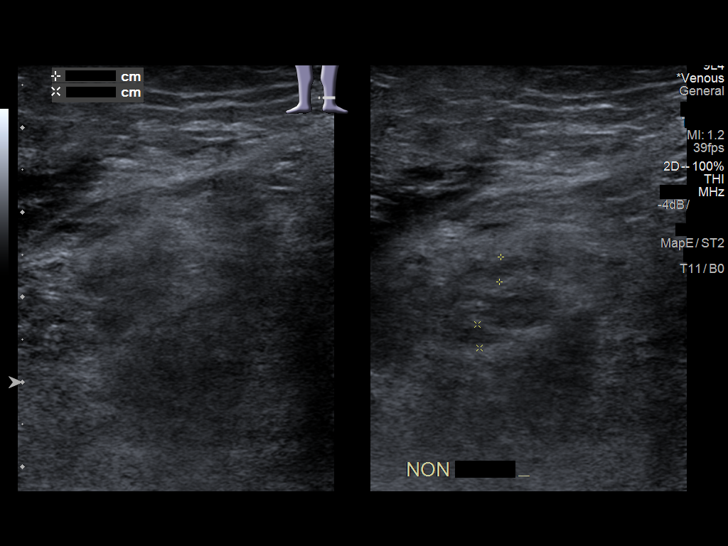
[im 32/32]
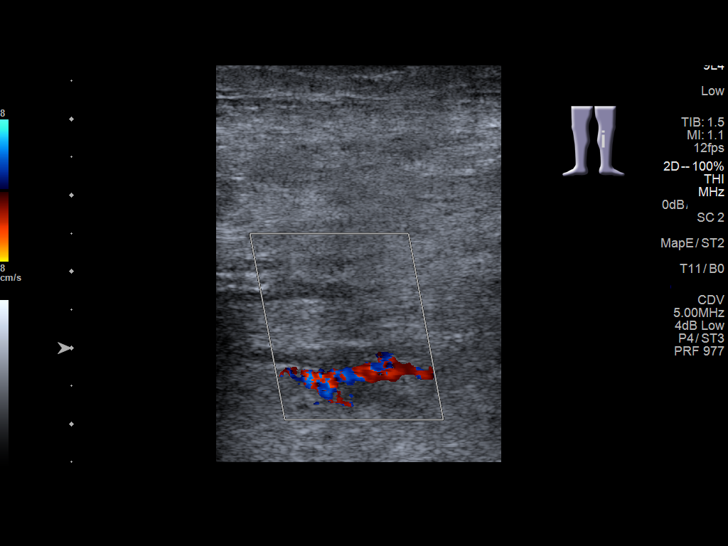

[13 of 24 positions shown; findings below may reference images not displayed]

FINDINGS: Contralateral Common Femoral Vein: Respiratory phasicity is normal
and symmetric with the symptomatic side. No evidence of thrombus.
Normal compressibility.

Common Femoral Vein: No evidence of thrombus. Normal
compressibility, respiratory phasicity and response to augmentation.

Saphenofemoral Junction: No evidence of thrombus. Normal
compressibility and flow on color Doppler imaging.

Profunda Femoral Vein: No evidence of thrombus. Normal
compressibility and flow on color Doppler imaging.

Femoral Vein: Thrombus is noted with decreased compressibility

Popliteal Vein: Thrombus is noted with decreased compressibility.

Calf Veins: Thrombus is noted with decreased compressibility.

Superficial Great Saphenous Vein: No evidence of thrombus. Normal
compressibility.

Venous Reflux:  None.

Other Findings:  None.
IMPRESSION: Diffuse deep venous thrombosis from the calf veins extending into
the popliteal and superficial femoral vein.
